# Patient Record
Sex: Male | Born: 1955
Health system: Southern US, Community
[De-identification: ages and names within clinical notes are randomized; demographics above are authoritative.]

## PROBLEM LIST (undated history)

## (undated) DIAGNOSIS — I10 Essential (primary) hypertension: Secondary | ICD-10-CM

## (undated) DIAGNOSIS — E7800 Pure hypercholesterolemia, unspecified: Secondary | ICD-10-CM

## (undated) DIAGNOSIS — M1990 Unspecified osteoarthritis, unspecified site: Secondary | ICD-10-CM

---

## 2006-07-30 ENCOUNTER — Inpatient Hospital Stay (HOSPITAL_COMMUNITY): Admission: EM | Admit: 2006-07-30 | Discharge: 2006-07-31 | Payer: Self-pay | Admitting: Emergency Medicine

## 2017-09-24 ENCOUNTER — Encounter (HOSPITAL_BASED_OUTPATIENT_CLINIC_OR_DEPARTMENT_OTHER): Payer: Self-pay | Admitting: Emergency Medicine

## 2017-09-24 ENCOUNTER — Emergency Department (HOSPITAL_BASED_OUTPATIENT_CLINIC_OR_DEPARTMENT_OTHER)
Admission: EM | Admit: 2017-09-24 | Discharge: 2017-09-24 | Disposition: A | Discharge status: Home / Self Care | Payer: Self-pay | Attending: Emergency Medicine | Admitting: Emergency Medicine

## 2017-09-24 ENCOUNTER — Emergency Department (HOSPITAL_BASED_OUTPATIENT_CLINIC_OR_DEPARTMENT_OTHER): Payer: Self-pay

## 2017-09-24 ENCOUNTER — Other Ambulatory Visit: Payer: Self-pay

## 2017-09-24 DIAGNOSIS — Z87891 Personal history of nicotine dependence: Secondary | ICD-10-CM | POA: Insufficient documentation

## 2017-09-24 DIAGNOSIS — Z8673 Personal history of transient ischemic attack (TIA), and cerebral infarction without residual deficits: Secondary | ICD-10-CM | POA: Insufficient documentation

## 2017-09-24 DIAGNOSIS — I10 Essential (primary) hypertension: Secondary | ICD-10-CM | POA: Insufficient documentation

## 2017-09-24 DIAGNOSIS — M159 Polyosteoarthritis, unspecified: Secondary | ICD-10-CM | POA: Insufficient documentation

## 2017-09-24 LAB — CBC WITH DIFFERENTIAL/PLATELET
BASOS ABS: 0.1 10*3/uL (ref 0.0–0.1)
Basophils Relative: 0 %
Eosinophils Absolute: 0.2 10*3/uL (ref 0.0–0.7)
Eosinophils Relative: 2 %
HEMATOCRIT: 45.4 % (ref 39.0–52.0)
Hemoglobin: 15.7 g/dL (ref 13.0–17.0)
LYMPHS PCT: 23 %
Lymphs Abs: 2.8 10*3/uL (ref 0.7–4.0)
MCH: 29.2 pg (ref 26.0–34.0)
MCHC: 34.6 g/dL (ref 30.0–36.0)
MCV: 84.4 fL (ref 78.0–100.0)
Monocytes Absolute: 0.7 10*3/uL (ref 0.1–1.0)
Monocytes Relative: 6 %
NEUTROS ABS: 8.2 10*3/uL — AB (ref 1.7–7.7)
NEUTROS PCT: 69 %
Platelets: 218 10*3/uL (ref 150–400)
RBC: 5.38 MIL/uL (ref 4.22–5.81)
RDW: 14.7 % (ref 11.5–15.5)
WBC: 11.9 10*3/uL — ABNORMAL HIGH (ref 4.0–10.5)

## 2017-09-24 LAB — COMPREHENSIVE METABOLIC PANEL
ALBUMIN: 3.8 g/dL (ref 3.5–5.0)
ALT: 12 U/L — ABNORMAL LOW (ref 17–63)
AST: 18 U/L (ref 15–41)
Alkaline Phosphatase: 86 U/L (ref 38–126)
Anion gap: 9 (ref 5–15)
BUN: 13 mg/dL (ref 6–20)
CHLORIDE: 107 mmol/L (ref 101–111)
CO2: 23 mmol/L (ref 22–32)
CREATININE: 1.27 mg/dL — AB (ref 0.61–1.24)
Calcium: 8.7 mg/dL — ABNORMAL LOW (ref 8.9–10.3)
GFR calc Af Amer: >60 mL/min (ref >60)
GLUCOSE: 94 mg/dL (ref 65–99)
Potassium: 3.6 mmol/L (ref 3.5–5.1)
Sodium: 139 mmol/L (ref 135–145)
Total Bilirubin: 0.8 mg/dL (ref 0.3–1.2)
Total Protein: 7.3 g/dL (ref 6.5–8.1)

## 2017-09-24 LAB — URINALYSIS, ROUTINE W REFLEX MICROSCOPIC
Bilirubin Urine: NEGATIVE
GLUCOSE, UA: NEGATIVE mg/dL
Hgb urine dipstick: NEGATIVE
Ketones, ur: NEGATIVE mg/dL
LEUKOCYTES UA: NEGATIVE
Nitrite: NEGATIVE
PROTEIN: NEGATIVE mg/dL
Specific Gravity, Urine: >1.03 — ABNORMAL HIGH (ref 1.005–1.030)
pH: 5.5 (ref 5.0–8.0)

## 2017-09-24 MED ORDER — TRAMADOL HCL 50 MG PO TABS: 50.0000 mg | ORAL_TABLET | Freq: Four times a day (QID) | ORAL | 0 refills | Status: DC | PRN

## 2017-09-24 MED ORDER — LISINOPRIL 10 MG PO TABS: 10.0000 mg | ORAL_TABLET | Freq: Every day | ORAL | 2 refills | Status: DC

## 2017-09-24 MED FILL — LISINOPRIL 10 MG TABS: 10 | 30 days supply | Qty: 30 | Fill #0

## 2017-09-24 MED FILL — traMADol HCL 50 MG TABS: 50 | 3 days supply | Qty: 15 | Fill #0

## 2017-09-24 NOTE — Discharge Instructions (Addendum)
Take the tramadol for the joint pain and low back pain.  CT of head shows evidence of multiple small Strokes in the past very important that you follow-up with neurology for further work-up.  Start taking a baby aspirin a day. Blood pressure has been significantly elevated here probably have undiagnosed hypertension.  Start the lisinopril as directed and you will need to get an appointment with a primary care doctor to make sure the blood pressure gets under control.  Return for any new or worse symptoms.

## 2017-09-24 NOTE — ED Notes (Signed)
NAD at this time. Pt is stable and going home.  

## 2017-09-24 NOTE — ED Triage Notes (Signed)
Pt family reports slurred speech for about 3 wks; pt c/o lower back and RT thigh pain; reports fall about 3 wks ago and had trouble getting up; sts he laid in the floor for about 3 hours

## 2017-09-25 NOTE — ED Provider Notes (Signed)
[facility] EMERGENCY DEPARTMENT Provider Note   CSN: [id number] Arrival date & time: 09/24/17  1056     History   Chief Complaint Chief Complaint  Patient presents with  . Slurred Speech  . Leg Pain    HPI Robert Donovan is a 62 y.o. male.  Patient with one set of concerns and family members with another set of concerns.  Patient brought in by family members.  Family was concerned about intermittent slurred speech for about 3 weeks.  Patient with complaint of low back pain and right thigh pain.  Apparently had a fall about 3 weeks ago and had trouble getting up.  Patient used to work as a Scientist, water quality.  He is to work in the [employer] as well.  Apparently about 3 weeks ago patient was on the floor for about 3 hours because he could not get up.  Family though has noted this persistent intermittent slurred speech and the very concerned about that.  Patient does not have a doctor has not had any medical care for a long period of time.  No known significant medical history.    Patient states that the most significant pain is lateral aspect of the right thigh.     History reviewed. No pertinent past medical history.  There are no active problems to display for this patient.   History reviewed. No pertinent surgical history.      Home Medications    Prior to Admission medications   Medication Sig Start Date End Date Taking? Authorizing Provider  lisinopril (PRINIVIL,ZESTRIL) 10 MG tablet Take 1 tablet (10 mg total) by mouth daily. 09/24/17   Vanetta Mulders, MD  traMADol (ULTRAM) 50 MG tablet Take 1 tablet (50 mg total) by mouth every 6 (six) hours as needed. 09/24/17   Vanetta Mulders, MD    Family History History reviewed. No pertinent family history.  Social History Social History   Tobacco Use  . Smoking status: Former Games developer  . Smokeless tobacco: Never Used  Substance Use Topics  . Alcohol use: Not Currently  . Drug use: Not Currently    Comment: hx  marijuana use     Allergies   Patient has no known allergies.   Review of Systems Review of Systems  Constitutional: Negative for fever.  HENT: Negative for congestion.   Eyes: Negative for redness and visual disturbance.  Respiratory: Negative for shortness of breath.   Cardiovascular: Negative for chest pain.  Gastrointestinal: Negative for abdominal pain.  Genitourinary: Negative for dysuria.  Musculoskeletal: Positive for back pain. Negative for joint swelling.  Skin: Negative for wound.  Neurological: Positive for speech difficulty. Negative for syncope, weakness, numbness and headaches.  Hematological: Does not bruise/bleed easily.  Psychiatric/Behavioral: Negative for confusion.     Physical Exam Updated Vital Signs BP (!) 235/106 (BP Location: Left Arm)   Pulse 62   Temp 98.4 F (36.9 C) (Oral)   Resp (!) 26   Ht 1.753 m (5\' 9" )   Wt (!) 145 kg (319 lb 10.7 oz)   SpO2 100%   BMI 47.21 kg/m   Physical Exam  Constitutional: He is oriented to person, place, and time. He appears well-developed and well-nourished. No distress.  HENT:  Head: Normocephalic and atraumatic.  Mouth/Throat: Oropharynx is clear and moist.  Eyes: Pupils are equal, round, and reactive to light. Conjunctivae and EOM are normal.  Neck: Neck supple.  No carotid bruits.  Cardiovascular: Normal rate and regular rhythm.  Pulmonary/Chest: Effort normal  and breath sounds normal.  Abdominal: Soft. Bowel sounds are normal. There is no tenderness.  Musculoskeletal: Normal range of motion.  No tenderness to palpation of low back.  No significant knee swelling.  There is pain with range of motion of the right knee right hip.  Neurological: He is alert and oriented to person, place, and time. No cranial nerve deficit or sensory deficit. He exhibits normal muscle tone. Coordination normal.  No evidence of any slurred speech today.  No significant focal neuro deficit.  Skin: Skin is warm.  Nursing  note and vitals reviewed.    ED Treatments / Results  Labs (all labs ordered are listed, but only abnormal results are displayed) Labs Reviewed  CBC WITH DIFFERENTIAL/PLATELET - Abnormal; Notable for the following components:      Result Value   WBC 11.9 (*)    Neutro Abs 8.2 (*)    All other components within normal limits  URINALYSIS, ROUTINE W REFLEX MICROSCOPIC - Abnormal; Notable for the following components:   Specific Gravity, Urine >1.030 (*)    All other components within normal limits  COMPREHENSIVE METABOLIC PANEL - Abnormal; Notable for the following components:   Creatinine, Ser 1.27 (*)    Calcium 8.7 (*)    ALT 12 (*)    All other components within normal limits    EKG EKG Interpretation  Date/Time:  Thursday September 24 2017 11:14:18 EDT Ventricular Rate:  77 PR Interval:    QRS Duration: 80 QT Interval:  370 QTC Calculation: 419 R Axis:   57 Text Interpretation:  Sinus rhythm Borderline T wave abnormalities No significant change since last tracing Confirmed by Vanetta Mulders [phone]) on 09/24/2017 12:12:47 PM   Radiology Dg Chest 2 View  Result Date: 09/24/2017 CLINICAL DATA:  Right femur and hip pain.  No injury. EXAM: CHEST - 2 VIEW COMPARISON:  CT 07/29/2006.  Chest x-ray 07/29/2006. FINDINGS: Mediastinum and hilar structures normal. Heart size normal. Low lung volumes with mild bibasilar atelectasis. Stable elevation left hemidiaphragm. No pleural effusion or pneumothorax. IMPRESSION: 1.  Low lung volumes with mild bibasilar atelectasis. 2.  Stable elevation left hemidiaphragm. Electronically Signed   By: Maisie Fus  Register   On: 09/24/2017 12:50   Ct Head Wo Contrast  Result Date: 09/24/2017 CLINICAL DATA:  Slurred speech for the past 3 weeks with recent falls. EXAM: CT HEAD WITHOUT CONTRAST TECHNIQUE: Contiguous axial images were obtained from the base of the skull through the vertex without intravenous contrast. COMPARISON:  None. FINDINGS: Brain:  Age-indeterminate lacunar infarcts in the left pons, thalamus, and basal ganglia. No evidence of acute hemorrhage, hydrocephalus, extra-axial collection or mass lesion/mass effect. Moderate periventricular and subcortical white matter hypodensities are nonspecific but favored to reflect chronic microvascular ischemic changes. Vascular: Atherosclerotic vascular calcification of the carotid siphons. No hyperdense vessel. Skull: Normal. Negative for fracture or focal lesion. Sinuses/Orbits: No acute finding. Other: None. IMPRESSION: 1. Age-indeterminate lacunar infarcts in the left pons, thalamus, and basal ganglia. Electronically Signed   By: Obie Dredge M.D.   On: 09/24/2017 12:56   Ct Lumbar Spine Wo Contrast  Result Date: 09/24/2017 CLINICAL DATA:  62 y/o M; fall 3 weeks ago with lower back pain and right hip pain. EXAM: CT LUMBAR SPINE WITHOUT CONTRAST TECHNIQUE: Multidetector CT imaging of the lumbar spine was performed without intravenous contrast administration. Multiplanar CT image reconstructions were also generated. COMPARISON:  None. FINDINGS: Segmentation: 5 lumbar type vertebrae. Alignment: Normal. Vertebrae: No acute fracture or focal pathologic process. Paraspinal  and other soft tissues: Calcific atherosclerosis of the iliofemoral arteries. Disc levels: Mild loss of the L4-5 and L5-S1 intervertebral disc space heights. Prominent multilevel lumbar facet arthrosis disc bulges and facet hypertrophy result in mild foraminal stenosis at the L3-4 and L4-5 levels multifactorial mild-to-moderate L3-4 and L4-5 canal stenosis. IMPRESSION: 1.  No acute fracture or malalignment. 2. Mild lumbar spondylosis with mild discogenic degenerative changes at the L4-5 and L5-S1 level and prominent facet arthropathy. Electronically Signed   By: Mitzi HansenLance  Furusawa-Stratton M.D.   On: 09/24/2017 14:31   Dg Hip Unilat W Or Wo Pelvis 2-3 Views Right  Result Date: 09/24/2017 CLINICAL DATA:  Right femur and hip pain.  No  injury. EXAM: DG HIP (WITH OR WITHOUT PELVIS) 2-3V RIGHT COMPARISON:  No prior. FINDINGS: Degenerative changes lumbar spine and both hips. No acute bony or joint abnormality. No evidence of fracture dislocation. IMPRESSION: Degenerative changes lumbar spine and both hips. No acute abnormality. Electronically Signed   By: Maisie Fushomas  Register   On: 09/24/2017 12:48   Dg Femur Min 2 Views Right  Result Date: 09/24/2017 CLINICAL DATA:  Right femur and hip pain.  No injury. EXAM: RIGHT FEMUR 2 VIEWS COMPARISON:  No recent. FINDINGS: Degenerative changes right hip and knee. No acute bony or joint abnormality identified. IMPRESSION: Degenerative changes right hip and knee. No acute abnormality identified. Electronically Signed   By: Maisie Fushomas  Register   On: 09/24/2017 12:47    Procedures Procedures (including critical care time)  Medications Ordered in ED Medications - No data to display   Initial Impression / Assessment and Plan / ED Course  I have reviewed the triage vital signs and the nursing notes.  Pertinent labs & imaging results that were available during my care of the patient were reviewed by me and considered in my medical decision making (see chart for details).    Patient with extensive work-up.  CT head MRI not available but CT head showed multiple small infarcts in the basal ganglia area.  All age indeterminate.  Patient significantly hypertensive here.  No carotid bruits.  None of these appear to be acute however follow-up and hypertensive control and follow-up for that will be very significant.  Work-up for all the joint pain seems to be degenerative in nature patient will be treated for tramadol and that started on lisinopril.  Did not see specific indication for admission.  Sent patient to neurology for follow-up asked for social worker to help him find a primary care doctor with various resources since he is currently unemployed.  In addition the patient's EKG had no atrial  fibrillation.  And chest x-ray without any significant findings.  Patient was given a fairly extensive work-up because he had not seen a primary care doctor in many many years.  The main findings were significant degenerative joint disease and arthritis.  Undiagnosed hypertension.  History of several small CVAs probably secondary to the hypertension.     Final Clinical Impressions(s) / ED Diagnoses   Final diagnoses:  Osteoarthritis of multiple joints, unspecified osteoarthritis type  History of multiple strokes  Essential hypertension    ED Discharge Orders        Ordered    traMADol (ULTRAM) 50 MG tablet  Every 6 hours PRN     09/24/17 1512    lisinopril (PRINIVIL,ZESTRIL) 10 MG tablet  Daily     09/24/17 1512       Vanetta MuldersZackowski, [place] Searles, MD 09/25/17 [phone]

## 2017-11-05 ENCOUNTER — Ambulatory Visit (INDEPENDENT_AMBULATORY_CARE_PROVIDER_SITE_OTHER): Payer: Self-pay | Admitting: Physician Assistant

## 2017-12-07 ENCOUNTER — Encounter (INDEPENDENT_AMBULATORY_CARE_PROVIDER_SITE_OTHER): Payer: Self-pay | Admitting: Physician Assistant

## 2017-12-07 ENCOUNTER — Emergency Department (HOSPITAL_COMMUNITY): Payer: Medicaid Other

## 2017-12-07 ENCOUNTER — Other Ambulatory Visit: Payer: Self-pay

## 2017-12-07 ENCOUNTER — Encounter (HOSPITAL_COMMUNITY): Payer: Self-pay

## 2017-12-07 ENCOUNTER — Ambulatory Visit (INDEPENDENT_AMBULATORY_CARE_PROVIDER_SITE_OTHER): Payer: Self-pay | Admitting: Physician Assistant

## 2017-12-07 ENCOUNTER — Emergency Department (HOSPITAL_COMMUNITY)
Admission: EM | Admit: 2017-12-07 | Discharge: 2017-12-07 | Disposition: A | Discharge status: Home / Self Care | Payer: Medicaid Other | Attending: Emergency Medicine | Admitting: Emergency Medicine

## 2017-12-07 VITALS — BP 212/110 | HR 57 | Temp 97.9°F | Ht 69.0 in | Wt 301.0 lb

## 2017-12-07 DIAGNOSIS — Z131 Encounter for screening for diabetes mellitus: Secondary | ICD-10-CM

## 2017-12-07 DIAGNOSIS — Z1211 Encounter for screening for malignant neoplasm of colon: Secondary | ICD-10-CM

## 2017-12-07 DIAGNOSIS — I10 Essential (primary) hypertension: Secondary | ICD-10-CM | POA: Insufficient documentation

## 2017-12-07 DIAGNOSIS — Z1159 Encounter for screening for other viral diseases: Secondary | ICD-10-CM

## 2017-12-07 DIAGNOSIS — Z7982 Long term (current) use of aspirin: Secondary | ICD-10-CM | POA: Insufficient documentation

## 2017-12-07 DIAGNOSIS — Z7722 Contact with and (suspected) exposure to environmental tobacco smoke (acute) (chronic): Secondary | ICD-10-CM | POA: Insufficient documentation

## 2017-12-07 DIAGNOSIS — Z6841 Body Mass Index (BMI) 40.0 and over, adult: Secondary | ICD-10-CM

## 2017-12-07 DIAGNOSIS — Z79899 Other long term (current) drug therapy: Secondary | ICD-10-CM | POA: Diagnosis not present

## 2017-12-07 DIAGNOSIS — Z114 Encounter for screening for human immunodeficiency virus [HIV]: Secondary | ICD-10-CM

## 2017-12-07 LAB — CBC
HCT: 46.1 % (ref 39.0–52.0)
HEMOGLOBIN: 14.6 g/dL (ref 13.0–17.0)
MCH: 28.3 pg (ref 26.0–34.0)
MCHC: 31.7 g/dL (ref 30.0–36.0)
MCV: 89.3 fL (ref 78.0–100.0)
Platelets: 224 10*3/uL (ref 150–400)
RBC: 5.16 MIL/uL (ref 4.22–5.81)
RDW: 13.9 % (ref 11.5–15.5)
WBC: 11.1 10*3/uL — ABNORMAL HIGH (ref 4.0–10.5)

## 2017-12-07 LAB — URINALYSIS, ROUTINE W REFLEX MICROSCOPIC
Bilirubin Urine: NEGATIVE
Glucose, UA: NEGATIVE mg/dL
Hgb urine dipstick: NEGATIVE
Ketones, ur: NEGATIVE mg/dL
Leukocytes, UA: NEGATIVE
Nitrite: NEGATIVE
Protein, ur: NEGATIVE mg/dL
Specific Gravity, Urine: 1.024 (ref 1.005–1.030)
pH: 5 (ref 5.0–8.0)

## 2017-12-07 LAB — I-STAT TROPONIN, ED: TROPONIN I, POC: 0.01 ng/mL (ref 0.00–0.08)

## 2017-12-07 LAB — BASIC METABOLIC PANEL
ANION GAP: 4 — AB (ref 5–15)
BUN: 13 mg/dL (ref 6–20)
CALCIUM: 9.6 mg/dL (ref 8.9–10.3)
CO2: 32 mmol/L (ref 22–32)
CREATININE: 1.33 mg/dL — AB (ref 0.61–1.24)
Chloride: 108 mmol/L (ref 98–111)
GFR calc Af Amer: >60 mL/min (ref >60)
GFR, EST NON AFRICAN AMERICAN: 57 mL/min — AB (ref >60)
GLUCOSE: 103 mg/dL — AB (ref 70–99)
Potassium: 4.2 mmol/L (ref 3.5–5.1)
Sodium: 144 mmol/L (ref 135–145)

## 2017-12-07 LAB — GLUCOSE, POCT (MANUAL RESULT ENTRY): POC GLUCOSE: 102 mg/dL — AB (ref 70–99)

## 2017-12-07 MED ORDER — LISINOPRIL 40 MG PO TABS: 40.0000 mg | ORAL_TABLET | Freq: Every day | ORAL | 11 refills | Status: DC

## 2017-12-07 MED ORDER — ATORVASTATIN CALCIUM 40 MG PO TABS: 40.0000 mg | ORAL_TABLET | Freq: Every day | ORAL | 11 refills | Status: DC

## 2017-12-07 MED ORDER — LISINOPRIL-HYDROCHLOROTHIAZIDE 20-25 MG PO TABS: 1.0000 | ORAL_TABLET | Freq: Every day | ORAL | 0 refills | Status: DC

## 2017-12-07 MED ORDER — CLONIDINE HCL 0.1 MG PO TABS
0.2000 mg | ORAL_TABLET | Freq: Once | ORAL | Status: AC
  Administered 2017-12-07: 0.2 mg via ORAL

## 2017-12-07 MED ORDER — HYDROCHLOROTHIAZIDE 25 MG PO TABS: 25.0000 mg | ORAL_TABLET | Freq: Every day | ORAL | 11 refills | Status: DC

## 2017-12-07 MED ORDER — ASPIRIN EC 81 MG PO TBEC: 81.0000 mg | DELAYED_RELEASE_TABLET | Freq: Every day | ORAL | 3 refills | Status: DC

## 2017-12-07 MED ORDER — HYDRALAZINE HCL 20 MG/ML IJ SOLN
10.0000 mg | Freq: Once | INTRAMUSCULAR | Status: AC
  Administered 2017-12-07: 10 mg via INTRAVENOUS
  Filled 2017-12-07: qty 1

## 2017-12-07 MED FILL — HYDROCHLOROTHIAZIDE 25 MG T: 25 | 30 days supply | Qty: 30 | Fill #0

## 2017-12-07 MED FILL — ATORVASTATIN CALCIUM 40 MG: 40 | 30 days supply | Qty: 30 | Fill #0

## 2017-12-07 MED FILL — LISINOPRIL 40 MG TABLET: 40 | 30 days supply | Qty: 30 | Fill #0

## 2017-12-07 NOTE — ED Provider Notes (Signed)
[facility] Provider Note   CSN: [id number] Arrival date & time: 12/07/17  1632     History   Chief Complaint Chief Complaint  Patient presents with  . Hypertension    HPI Rachael FeeDarryl Robert Donovan is a 62 y.o. male.  HPI   Robert Donovan is a 62yo male with a history of HTN and arthritis who presents to the Emergency Department from his PCP office for evaluation of hypertension.  Patient reports that he was establishing care with PCP today and they noted his blood pressure to be 215/105 in the office.  They gave him 0.2 mg clonidine and this did not improve his blood pressure so they sent him here for further evaluation and work-up.  Patient denies any symptoms.  He denies chest pain, shortness of breath, abdominal pain, nausea/vomiting, reduction in urine, numbness, weakness, headache, visual disturbance, leg swelling.  States that he was seen in the ER 08/2017 where he was started on lisinopril.  Reports taking that for 2 weeks, but then stopped because he kept forgetting to take it.  He does state that he has had shortness of breath upon exertion for many years now, unchanged from baseline.    History reviewed. No pertinent past medical history.  There are no active problems to display for this patient.   History reviewed. No pertinent surgical history.      Home Medications    Prior to Admission medications   Medication Sig Start Date End Date Taking? Authorizing Provider  aspirin EC 81 MG tablet Take 1 tablet (81 mg total) by mouth daily. Patient not taking: Reported on 12/07/2017 12/07/17   Loletta SpecterGomez, Roger David, PA-C  atorvastatin (LIPITOR) 40 MG tablet Take 1 tablet (40 mg total) by mouth daily. Patient not taking: Reported on 12/07/2017 12/07/17   Loletta SpecterGomez, Roger David, PA-C  hydrochlorothiazide (HYDRODIURIL) 25 MG tablet Take 1 tablet (25 mg total) by mouth daily. Take on tablet in the morning. Patient not taking: Reported on 12/07/2017 12/07/17   Loletta SpecterGomez,  Roger David, PA-C  lisinopril (PRINIVIL,ZESTRIL) 40 MG tablet Take 1 tablet (40 mg total) by mouth daily. Patient not taking: Reported on 12/07/2017 12/07/17   Loletta SpecterGomez, Roger David, PA-C    Family History No family history on file.  Social History Social History   Tobacco Use  . Smoking status: Former Games developermoker  . Smokeless tobacco: Never Used  Substance Use Topics  . Alcohol use: Not Currently  . Drug use: Not Currently    Comment: hx marijuana use     Allergies   Patient has no known allergies.   Review of Systems Review of Systems  Constitutional: Negative for chills and fever.  HENT: Negative for congestion and trouble swallowing.   Eyes: Negative for visual disturbance.  Respiratory: Negative for cough, shortness of breath and wheezing.   Cardiovascular: Negative for chest pain and leg swelling.  Gastrointestinal: Negative for abdominal pain, nausea and vomiting.  Genitourinary: Negative for difficulty urinating and frequency.  Musculoskeletal: Negative for back pain and gait problem.  Skin: Negative for rash.  Neurological: Negative for weakness, light-headedness and headaches.     Physical Exam Updated Vital Signs BP (!) 145/78   Pulse 61   Temp (!) 97.3 F (36.3 C) (Oral)   Resp (!) 23   Ht 5\' 9"  (1.753 m)   Wt (!) 136.5 kg (301 lb)   SpO2 96%   BMI 44.45 kg/m   Physical Exam  Constitutional: He is oriented to person, place,  and time. He appears well-developed and well-nourished. No distress.  Nontoxic-appearing, no acute distress.  HENT:  Head: Normocephalic and atraumatic.  Mouth/Throat: Oropharynx is clear and moist. No oropharyngeal exudate.  Eyes: Pupils are equal, round, and reactive to light. Conjunctivae are normal. Right eye exhibits no discharge. Left eye exhibits no discharge.  Neck: Normal range of motion. Neck supple. No JVD present. No tracheal deviation present.  Cardiovascular: Normal rate, regular rhythm and intact distal pulses.    Pulmonary/Chest: Effort normal and breath sounds normal. No stridor. No respiratory distress. He has no wheezes. He has no rales.  Abdominal: Soft. Bowel sounds are normal. There is no tenderness.  Musculoskeletal:  No appreciable leg swelling.  Neurological: He is alert and oriented to person, place, and time. Coordination normal.  Skin: Skin is warm and dry. He is not diaphoretic.  Psychiatric: He has a normal mood and affect. His behavior is normal.  Nursing note and vitals reviewed.    ED Treatments / Results  Labs (all labs ordered are listed, but only abnormal results are displayed) Labs Reviewed  BASIC METABOLIC PANEL - Abnormal; Notable for the following components:      Result Value   Glucose, Bld 103 (*)    Creatinine, Ser 1.33 (*)    GFR calc non Af Amer 57 (*)    Anion gap 4 (*)    All other components within normal limits  CBC - Abnormal; Notable for the following components:   WBC 11.1 (*)    All other components within normal limits  URINALYSIS, ROUTINE W REFLEX MICROSCOPIC  I-STAT TROPONIN, ED    EKG EKG Interpretation  Date/Time:  Monday December 07 2017 17:15:15 EDT Ventricular Rate:  55 PR Interval:  138 QRS Duration: 82 QT Interval:  428 QTC Calculation: 409 R Axis:   52 Text Interpretation:  Sinus bradycardia Nonspecific T wave abnormality Abnormal ECG Confirmed by Rolan Bucco [phone]) on 12/08/2017 8:09:09 AM   Radiology Dg Chest 2 View  Result Date: 12/07/2017 CLINICAL DATA:  Chest pain, recent diagnosis of hypertension, slurred speech, stutter, history of stroke and fall 1 month ago, former smoker EXAM: CHEST - 2 VIEW COMPARISON:  09/24/2017 FINDINGS: Normal heart size, mediastinal contours, and pulmonary vascularity. Bronchitic changes with persistent atelectasis at LEFT base. Remaining lungs clear. No pleural effusion or pneumothorax. Scattered endplate spur formation thoracic spine. IMPRESSION: Bronchitic changes with persistent LEFT basilar  atelectasis. Electronically Signed   By: Ulyses Southward M.D.   On: 12/07/2017 18:15    Procedures Procedures (including critical care time)  Medications Ordered in ED Medications  hydrALAZINE (APRESOLINE) injection 10 mg (10 mg Intravenous Given 12/07/17 2158)     Initial Impression / Assessment and Plan / ED Course  I have reviewed the triage vital signs and the nursing notes.  Pertinent labs & imaging results that were available during my care of the patient were reviewed by me and considered in my medical decision making (see chart for details).    Patient presents from his PCP office for evaluation of hypertension.  He has a history of this, is not taking prescribed lisinopril.  Has no physical complaints.  He denies chest pain, shortness of breath, reduction in urine, abdominal pain, nausea/vomiting, leg swelling, headache, double vision.  Initial blood pressure 218/125. On exam he is afebrile and no-toxic. Lungs CTA. Heart rate regular, no murmur. No neurological deficits. No signs of fluid overload. Doubt hypertensive emergency given patient is asymptomatic.   Patient had basic labs  and chest x-ray ordered at triage.  I-STAT troponin negative, he denies chest pain and EKG nonischemic.  No concern for ACS at this time.  BMP reveals mildly elevated creatinine which appears to be baseline (1.33 versus 1.27 baseline.)  CBC with mild leukocytosis, although this seems to be chronic as well (WBC 11.1) UA WNL.  Chest x-ray with stable bronchitic changes.  No pulmonary edema or cardiomegaly on chest x-ray and no evidence of fluid overload on exam.  Do not suspect acute CHF exacerbation.  Patient will be discharged with lisinopril/HCTZ combo given he has tolerated these medications in the past.  Have counseled him to follow-up with his PCP for close monitoring.  Have counseled him on reasons to return to the emergency department and he agrees and appears reliable.  Final Clinical Impressions(s) / ED  Diagnoses   Final diagnoses:  Hypertension, unspecified type    ED Discharge Orders        Ordered    lisinopril-hydrochlorothiazide (PRINZIDE,ZESTORETIC) 20-25 MG tablet  Daily     12/07/17 2231       Kellie Shropshire, PA-C 12/09/17 1542    Vanetta Mulders, MD 12/09/17 [phone]

## 2017-12-07 NOTE — ED Triage Notes (Signed)
Pt states that he was seen today and told his BP was high and the he should come to ED to be evaluated. Pt denies being on any BP medication.

## 2017-12-07 NOTE — Discharge Instructions (Addendum)
Your blood work, EKG and chest x-ray were reassuring.  Please have your kidney function and blood pressure rechecked by your regular doctor.  There was a small bump in your creatinine which is a measure of kidney function (1.33).  I have written you prescription for blood pressure medication that you can take daily.  Please take this as prescribed.  Return to the ER if you have any new or concerning symptoms like chest pain, trouble breathing, headache, problems with your vision.

## 2017-12-07 NOTE — ED Provider Notes (Signed)
Patient placed in Quick Look pathway, seen and evaluated   Chief Complaint: Hypertension  HPI:   Patient brought in by PCP for management of resistant hypertension.  He established care today with his PCP who noted him to be quite hypertensive.  He was seen in the ED in April and was given a prescription for lisinopril which he states he took to completion but has not had any since then.  Denies chest pain, shortness of breath, vision changes, headaches, decreased urine output, abdominal pain, numbness, or weakness.  Blood pressure in the office today was 205/112.  Patient was given 0.2 mg of clonidine in the office without improvement in his blood pressure.  Sent to the ED for further evaluation and management.  ROS: Negative for chest pain, shortness of breath, abdominal pain, nausea, vomiting, decreased urine output, vision changes, headaches  Physical Exam:   Gen: No distress, morbidly obese, resting comfortable in chair  Neuro: Awake and Alert  Skin: Warm    Focused Exam: No dysarthric speech, no facial droop.  Cranial nerves appear grossly intact as do EOMs.  Heart rate and rhythm regular, no murmurs rubs or gallops noted.  Equal rise and fall of chest, no increased work of breathing.  2+ radial and DP/PT pulses bilaterally.   Initiation of care has begun. The patient has been counseled on the process, plan, and necessity for staying for the completion/evaluation, and the remainder of the medical screening examination    Jeanie SewerFawze, Saafir Abdullah A, PA-C 12/07/17 1723    Maia PlanLong, Joshua G, MD 12/08/17 1102

## 2017-12-07 NOTE — ED Notes (Signed)
ED Provider at bedside. 

## 2017-12-07 NOTE — Progress Notes (Signed)
Subjective:  Patient ID: Robert Donovan, male    DOB: 02/15/1956  Age: 62 y.o. MRN: [id number]  CC: establish care   HPI Robert Donovan is a 62 y.o. male with a medical history of HTN, multiple strokes, obesity, and OA of multiple joints presents as a new patient. He is not sure why he came in today but was told to establish a PCP. Went to ED on 09/24/17 for family member's concern for slurred speech and diagnosed with HTN. CT head revealed age-indeterminate lacunar infarcts in the left pons, thalamus, and basal ganglia. Pt currently taking lisinopril 10 mg. Admits to eating salty foods. Does not exercise regularly. BMI 44.45 kg/m2 today in clinic. Does not endorse CP, palpitations, SOB, HA, tingling, numbness, slurred speech, weakness, paralysis, abdominal pain, f/c/n/v, rash, or GI/GU sxs.         Outpatient Medications Prior to Visit  Medication Sig Dispense Refill  . lisinopril (PRINIVIL,ZESTRIL) 10 MG tablet Take 1 tablet (10 mg total) by mouth daily. (Patient not taking: Reported on 12/07/2017) 30 tablet 2  . traMADol (ULTRAM) 50 MG tablet Take 1 tablet (50 mg total) by mouth every 6 (six) hours as needed. 15 tablet 0   No facility-administered medications prior to visit.      ROS Review of Systems  Constitutional: Negative for chills, fever and malaise/fatigue.  Eyes: Negative for blurred vision.  Respiratory: Negative for shortness of breath.   Cardiovascular: Negative for chest pain and palpitations.  Gastrointestinal: Negative for abdominal pain and nausea.  Genitourinary: Negative for dysuria and hematuria.  Musculoskeletal: Negative for joint pain and myalgias.  Skin: Negative for rash.  Neurological: Negative for tingling and headaches.  Psychiatric/Behavioral: Negative for depression. The patient is not nervous/anxious.     Objective:  BP (!) 205/112 (BP Location: Right Arm, Patient Position: Sitting, Cuff Size: Large)   Pulse 73   Temp 97.9 F (36.6 C) (Oral)   Ht  5\' 9"  (1.753 m)   Wt (!) 301 lb (136.5 kg)   SpO2 92%   BMI 44.45 kg/m   BP/Weight 12/07/2017 09/24/2017  Systolic BP 205 235  Diastolic BP 112 106  Wt. (Lbs) 301 319.67  BMI 44.45 47.21      Physical Exam  Constitutional: He is oriented to person, place, and time.  Well developed, well nourished, NAD, polite  HENT:  Head: Normocephalic and atraumatic.  Eyes: No scleral icterus.  Neck: Normal range of motion. Neck supple. No thyromegaly present.  Cardiovascular: Normal rate, regular rhythm and normal heart sounds.  Pulmonary/Chest: Effort normal and breath sounds normal.  Abdominal: Soft. Bowel sounds are normal. There is no tenderness.  Musculoskeletal: He exhibits no edema.  Neurological: He is alert and oriented to person, place, and time.  Skin: Skin is warm and dry. No rash noted. No erythema. No pallor.  Psychiatric: He has a normal mood and affect. His behavior is normal. Thought content normal.  Vitals reviewed.    Assessment & Plan:   1. Hypertension, unspecified type - BP resistant to clonidine administered in clinic. Several readings were elevated over a one and a half hour period. Pt advised to have BP lowered at emergency department. - administered cloNIDine (CATAPRES) tablet 0.2 mg - TSH - Basic Metabolic Panel - Lipid panel; Future - hydrochlorothiazide (HYDRODIURIL) 25 MG tablet; Take 1 tablet (25 mg total) by mouth daily. Take on tablet in the morning.  Dispense: 30 tablet; Refill: 11 - lisinopril (PRINIVIL,ZESTRIL) 40 MG tablet; Take 1 tablet (40  mg total) by mouth daily.  Dispense: 30 tablet; Refill: 11 - aspirin EC 81 MG tablet; Take 1 tablet (81 mg total) by mouth daily.  Dispense: 90 tablet; Refill: 3 - atorvastatin (LIPITOR) 40 MG tablet; Take 1 tablet (40 mg total) by mouth daily.  Dispense: 30 tablet; Refill: 11  2. Screening for HIV (human immunodeficiency virus) - HIV antibody  3. Need for hepatitis C screening test - Hepatitis c antibody  (reflex)  4. Special screening for malignant neoplasms, colon - Fecal occult blood, imunochemical  5. Class 3 severe obesity with serious comorbidity and body mass index of 40.0 to 44.9 in adult, unspecified obesity type - Pt advised to complete CAFA process so he may be sent to a nutritionist. Pt given CAFA application in clinic today.     Meds ordered this encounter  Medications  . cloNIDine (CATAPRES) tablet 0.2 mg  . hydrochlorothiazide (HYDRODIURIL) 25 MG tablet    Sig: Take 1 tablet (25 mg total) by mouth daily. Take on tablet in the morning.    Dispense:  30 tablet    Refill:  11    Order Specific Question:   Supervising Provider    Answer:   Hoy Register [[id number]]  . lisinopril (PRINIVIL,ZESTRIL) 40 MG tablet    Sig: Take 1 tablet (40 mg total) by mouth daily.    Dispense:  30 tablet    Refill:  11    Order Specific Question:   Supervising Provider    Answer:   Hoy Register [[id number]]  . aspirin EC 81 MG tablet    Sig: Take 1 tablet (81 mg total) by mouth daily.    Dispense:  90 tablet    Refill:  3    Order Specific Question:   Supervising Provider    Answer:   Hoy Register [[id number]]  . atorvastatin (LIPITOR) 40 MG tablet    Sig: Take 1 tablet (40 mg total) by mouth daily.    Dispense:  30 tablet    Refill:  11    Order Specific Question:   Supervising Provider    Answer:   Hoy Register [[id number]]    Follow-up: Return in about 1 month (around 01/04/2018) for HTN.   Loletta Specter PA

## 2017-12-07 NOTE — Patient Instructions (Signed)
DASH Eating Plan DASH stands for "Dietary Approaches to Stop Hypertension." The DASH eating plan is a healthy eating plan that has been shown to reduce high blood pressure (hypertension). It may also reduce your risk for type 2 diabetes, heart disease, and stroke. The DASH eating plan may also help with weight loss. What are tips for following this plan? General guidelines  Avoid eating more than 2,300 mg (milligrams) of salt (sodium) a day. If you have hypertension, you may need to reduce your sodium intake to 1,500 mg a day.  Limit alcohol intake to no more than 1 drink a day for nonpregnant women and 2 drinks a day for men. One drink equals 12 oz of beer, 5 oz of wine, or 1 oz of hard liquor.  Work with your health care provider to maintain a healthy body weight or to lose weight. Ask what an ideal weight is for you.  Get at least 30 minutes of exercise that causes your heart to beat faster (aerobic exercise) most days of the week. Activities may include walking, swimming, or biking.  Work with your health care provider or diet and nutrition specialist (dietitian) to adjust your eating plan to your individual calorie needs. Reading food labels  Check food labels for the amount of sodium per serving. Choose foods with less than 5 percent of the Daily Value of sodium. Generally, foods with less than 300 mg of sodium per serving fit into this eating plan.  To find whole grains, look for the word "whole" as the first word in the ingredient list. Shopping  Buy products labeled as "low-sodium" or "no salt added."  Buy fresh foods. Avoid canned foods and premade or frozen meals. Cooking  Avoid adding salt when cooking. Use salt-free seasonings or herbs instead of table salt or sea salt. Check with your health care provider or pharmacist before using salt substitutes.  Do not fry foods. Cook foods using healthy methods such as baking, boiling, grilling, and broiling instead.  Cook with  heart-healthy oils, such as olive, canola, soybean, or sunflower oil. Meal planning   Eat a balanced diet that includes: ? 5 or more servings of fruits and vegetables each day. At each meal, try to fill half of your plate with fruits and vegetables. ? Up to 6-8 servings of whole grains each day. ? Less than 6 oz of lean meat, poultry, or fish each day. A 3-oz serving of meat is about the same size as a deck of cards. One egg equals 1 oz. ? 2 servings of low-fat dairy each day. ? A serving of nuts, seeds, or beans 5 times each week. ? Heart-healthy fats. Healthy fats called Omega-3 fatty acids are found in foods such as flaxseeds and coldwater fish, like sardines, salmon, and mackerel.  Limit how much you eat of the following: ? Canned or prepackaged foods. ? Food that is high in trans fat, such as fried foods. ? Food that is high in saturated fat, such as fatty meat. ? Sweets, desserts, sugary drinks, and other foods with added sugar. ? Full-fat dairy products.  Do not salt foods before eating.  Try to eat at least 2 vegetarian meals each week.  Eat more home-cooked food and less restaurant, buffet, and fast food.  When eating at a restaurant, ask that your food be prepared with less salt or no salt, if possible. What foods are recommended? The items listed may not be a complete list. Talk with your dietitian about what   dietary choices are best for you. Grains Whole-grain or whole-wheat bread. Whole-grain or whole-wheat pasta. Brown rice. Oatmeal. Quinoa. Bulgur. Whole-grain and low-sodium cereals. Pita bread. Low-fat, low-sodium crackers. Whole-wheat flour tortillas. Vegetables Fresh or frozen vegetables (raw, steamed, roasted, or grilled). Low-sodium or reduced-sodium tomato and vegetable juice. Low-sodium or reduced-sodium tomato sauce and tomato paste. Low-sodium or reduced-sodium canned vegetables. Fruits All fresh, dried, or frozen fruit. Canned fruit in natural juice (without  added sugar). Meat and other protein foods Skinless chicken or turkey. Ground chicken or turkey. Pork with fat trimmed off. Fish and seafood. Egg whites. Dried beans, peas, or lentils. Unsalted nuts, nut butters, and seeds. Unsalted canned beans. Lean cuts of beef with fat trimmed off. Low-sodium, lean deli meat. Dairy Low-fat (1%) or fat-free (skim) milk. Fat-free, low-fat, or reduced-fat cheeses. Nonfat, low-sodium ricotta or cottage cheese. Low-fat or nonfat yogurt. Low-fat, low-sodium cheese. Fats and oils Soft margarine without trans fats. Vegetable oil. Low-fat, reduced-fat, or light mayonnaise and salad dressings (reduced-sodium). Canola, safflower, olive, soybean, and sunflower oils. Avocado. Seasoning and other foods Herbs. Spices. Seasoning mixes without salt. Unsalted popcorn and pretzels. Fat-free sweets. What foods are not recommended? The items listed may not be a complete list. Talk with your dietitian about what dietary choices are best for you. Grains Baked goods made with fat, such as croissants, muffins, or some breads. Dry pasta or rice meal packs. Vegetables Creamed or fried vegetables. Vegetables in a cheese sauce. Regular canned vegetables (not low-sodium or reduced-sodium). Regular canned tomato sauce and paste (not low-sodium or reduced-sodium). Regular tomato and vegetable juice (not low-sodium or reduced-sodium). Pickles. Olives. Fruits Canned fruit in a light or heavy syrup. Fried fruit. Fruit in cream or butter sauce. Meat and other protein foods Fatty cuts of meat. Ribs. Fried meat. Bacon. Sausage. Bologna and other processed lunch meats. Salami. Fatback. Hotdogs. Bratwurst. Salted nuts and seeds. Canned beans with added salt. Canned or smoked fish. Whole eggs or egg yolks. Chicken or turkey with skin. Dairy Whole or 2% milk, cream, and half-and-half. Whole or full-fat cream cheese. Whole-fat or sweetened yogurt. Full-fat cheese. Nondairy creamers. Whipped toppings.  Processed cheese and cheese spreads. Fats and oils Butter. Stick margarine. Lard. Shortening. Ghee. Bacon fat. Tropical oils, such as coconut, palm kernel, or palm oil. Seasoning and other foods Salted popcorn and pretzels. Onion salt, garlic salt, seasoned salt, table salt, and sea salt. Worcestershire sauce. Tartar sauce. Barbecue sauce. Teriyaki sauce. Soy sauce, including reduced-sodium. Steak sauce. Canned and packaged gravies. Fish sauce. Oyster sauce. Cocktail sauce. Horseradish that you find on the shelf. Ketchup. Mustard. Meat flavorings and tenderizers. Bouillon cubes. Hot sauce and Tabasco sauce. Premade or packaged marinades. Premade or packaged taco seasonings. Relishes. Regular salad dressings. Where to find more information:  National Heart, Lung, and Blood Institute: www.nhlbi.nih.gov  American Heart Association: www.heart.org Summary  The DASH eating plan is a healthy eating plan that has been shown to reduce high blood pressure (hypertension). It may also reduce your risk for type 2 diabetes, heart disease, and stroke.  With the DASH eating plan, you should limit salt (sodium) intake to 2,300 mg a day. If you have hypertension, you may need to reduce your sodium intake to 1,500 mg a day.  When on the DASH eating plan, aim to eat more fresh fruits and vegetables, whole grains, lean proteins, low-fat dairy, and heart-healthy fats.  Work with your health care provider or diet and nutrition specialist (dietitian) to adjust your eating plan to your individual   calorie needs. This information is not intended to replace advice given to you by your health care provider. Make sure you discuss any questions you have with your health care provider. Document Released: 05/08/2011 Document Revised: 05/12/2016 Document Reviewed: 05/12/2016 Elsevier Interactive Patient Education  2018 Elsevier Inc.  

## 2017-12-08 LAB — BASIC METABOLIC PANEL
BUN/Creatinine Ratio: 11 (ref 9–20)
BUN: 13 mg/dL (ref 6–24)
CO2: 22 mmol/L (ref 20–29)
Calcium: 9.4 mg/dL (ref 8.7–10.2)
Chloride: 106 mmol/L (ref 96–106)
Creatinine, Ser: 1.19 mg/dL (ref 0.76–1.27)
GFR calc Af Amer: 77 mL/min/{1.73_m2} (ref >59)
GFR, EST NON AFRICAN AMERICAN: 66 mL/min/{1.73_m2} (ref >59)
GLUCOSE: 101 mg/dL — AB (ref 65–99)
POTASSIUM: 4.5 mmol/L (ref 3.5–5.2)
Sodium: 145 mmol/L — ABNORMAL HIGH (ref 134–144)

## 2017-12-08 LAB — HEPATITIS C ANTIBODY (REFLEX): HCV Ab: <0.1 s/co ratio (ref 0.0–0.9)

## 2017-12-08 LAB — HCV COMMENT:

## 2017-12-08 LAB — HEMOGLOBIN A1C
Est. average glucose Bld gHb Est-mCnc: 97 mg/dL
Hgb A1c MFr Bld: 5 % (ref 4.8–5.6)

## 2017-12-08 LAB — HIV ANTIBODY (ROUTINE TESTING W REFLEX): HIV SCREEN 4TH GENERATION: NONREACTIVE

## 2017-12-08 LAB — TSH: TSH: 1.59 u[IU]/mL (ref 0.450–4.500)

## 2017-12-09 ENCOUNTER — Telehealth (INDEPENDENT_AMBULATORY_CARE_PROVIDER_SITE_OTHER): Payer: Self-pay

## 2017-12-09 NOTE — Telephone Encounter (Signed)
-----   Message from Loletta Specteroger David Gomez, PA-C sent at 12/08/2017  8:35 AM EDT ----- All labs normal.

## 2017-12-09 NOTE — Telephone Encounter (Signed)
Patient is aware of normal labs. Tempestt S Roberts, CMA  

## 2017-12-10 LAB — FECAL OCCULT BLOOD, IMMUNOCHEMICAL: FECAL OCCULT BLD: NEGATIVE

## 2017-12-11 ENCOUNTER — Telehealth (INDEPENDENT_AMBULATORY_CARE_PROVIDER_SITE_OTHER): Payer: Self-pay

## 2017-12-11 NOTE — Telephone Encounter (Signed)
-----   Message from Loletta Specteroger David Gomez, PA-C sent at 12/10/2017  8:36 AM EDT ----- Negative FIT.

## 2017-12-11 NOTE — Telephone Encounter (Signed)
Patient aware of negative FIT ( no blood found). Robert Donovan, CMA

## 2017-12-21 ENCOUNTER — Ambulatory Visit: Payer: Self-pay | Attending: Physician Assistant

## 2017-12-30 ENCOUNTER — Ambulatory Visit: Payer: Self-pay | Attending: Physician Assistant

## 2018-01-07 ENCOUNTER — Other Ambulatory Visit: Payer: Self-pay

## 2018-01-07 ENCOUNTER — Ambulatory Visit (INDEPENDENT_AMBULATORY_CARE_PROVIDER_SITE_OTHER): Payer: Self-pay | Admitting: Physician Assistant

## 2018-01-07 ENCOUNTER — Encounter (INDEPENDENT_AMBULATORY_CARE_PROVIDER_SITE_OTHER): Payer: Self-pay | Admitting: Physician Assistant

## 2018-01-07 VITALS — BP 149/88 | HR 76 | Temp 97.7°F | Ht 69.0 in | Wt 293.4 lb

## 2018-01-07 DIAGNOSIS — I10 Essential (primary) hypertension: Secondary | ICD-10-CM

## 2018-01-07 MED ORDER — ATORVASTATIN CALCIUM 40 MG PO TABS: 40.0000 mg | ORAL_TABLET | Freq: Every day | ORAL | 11 refills | Status: DC

## 2018-01-07 MED ORDER — LISINOPRIL-HYDROCHLOROTHIAZIDE 20-25 MG PO TABS: 1.0000 | ORAL_TABLET | Freq: Every day | ORAL | 11 refills | Status: DC

## 2018-01-07 MED ORDER — ASPIRIN EC 81 MG PO TBEC: 81.0000 mg | DELAYED_RELEASE_TABLET | Freq: Every day | ORAL | 3 refills | Status: DC

## 2018-01-07 MED FILL — ?ATORVASTATIN 40MG TABLET: 40 | 30 days supply | Qty: 30 | Fill #0

## 2018-01-07 MED FILL — ?LISINOPRIL-HCTZ 20/25 TAB: 20-25 | 30 days supply | Qty: 30 | Fill #0

## 2018-01-07 NOTE — Patient Instructions (Signed)

## 2018-01-07 NOTE — Progress Notes (Signed)
Subjective:  Patient ID: Robert Donovan, male    DOB: 10/08/1955  Age: 62 y.o. MRN: [id number]  CC: f/u HTN  HPI Robert Donovan is a 62 y.o. male with a medical history of HTN, multiple strokes, obesity, and OA of multiple joints presents on HTN f/u. Last BP 212/110 mmHg. Prescribed HCTZ, Lisinopril, and advised to go to ED that day. ED workup found no evidence for admission or observation. ED prescribed Prinzide 20 - 25 mg. Patient has been taking Prinzide daily and ran out yesterday. Says he feels well and denies any symptoms or complaints.     Outpatient Medications Prior to Visit  Medication Sig Dispense Refill  . lisinopril-hydrochlorothiazide (PRINZIDE,ZESTORETIC) 20-25 MG tablet Take 1 tablet by mouth daily. 30 tablet 0  . aspirin EC 81 MG tablet Take 1 tablet (81 mg total) by mouth daily. (Patient not taking: Reported on 12/07/2017) 90 tablet 3  . atorvastatin (LIPITOR) 40 MG tablet Take 1 tablet (40 mg total) by mouth daily. (Patient not taking: Reported on 12/07/2017) 30 tablet 11  . hydrochlorothiazide (HYDRODIURIL) 25 MG tablet Take 1 tablet (25 mg total) by mouth daily. Take on tablet in the morning. (Patient not taking: Reported on 12/07/2017) 30 tablet 11  . lisinopril (PRINIVIL,ZESTRIL) 40 MG tablet Take 1 tablet (40 mg total) by mouth daily. (Patient not taking: Reported on 12/07/2017) 30 tablet 11   No facility-administered medications prior to visit.      ROS Review of Systems  Constitutional: Negative for chills, fever and malaise/fatigue.  Eyes: Negative for blurred vision.  Respiratory: Negative for shortness of breath.   Cardiovascular: Negative for chest pain and palpitations.  Gastrointestinal: Negative for abdominal pain and nausea.  Genitourinary: Negative for dysuria and hematuria.  Musculoskeletal: Negative for joint pain and myalgias.  Skin: Negative for rash.  Neurological: Negative for tingling and headaches.  Psychiatric/Behavioral: Negative for depression.  The patient is not nervous/anxious.     Objective:  BP (!) 149/88 (BP Location: Left Arm, Patient Position: Sitting, Cuff Size: Large)   Pulse 76   Temp 97.7 F (36.5 C) (Oral)   Ht 5\' 9"  (1.753 m)   Wt 293 lb 6.4 oz (133.1 kg)   SpO2 94%   BMI 43.33 kg/m   BP/Weight 01/07/2018 12/07/2017 12/07/2017  Systolic BP 149 150 212  Diastolic BP 88 78 110  Wt. (Lbs) 293.4 301 301  BMI 43.33 44.45 44.45      Physical Exam  Constitutional: He is oriented to person, place, and time.  Well developed, obese, NAD, polite  HENT:  Head: Normocephalic and atraumatic.  Eyes: No scleral icterus.  Neck: Normal range of motion. Neck supple. No thyromegaly present.  Cardiovascular: Normal rate, regular rhythm and normal heart sounds.  Pulmonary/Chest: Effort normal and breath sounds normal.  Abdominal: Soft. Bowel sounds are normal. There is no tenderness.  Musculoskeletal: He exhibits no edema.  Neurological: He is alert and oriented to person, place, and time.  Skin: Skin is warm and dry. No rash noted. No erythema. No pallor.  Psychiatric: He has a normal mood and affect. His behavior is normal. Thought content normal.  Vitals reviewed.    Assessment & Plan:    1. Hypertension, unspecified type - Refill atorvastatin (LIPITOR) 40 MG tablet; Take 1 tablet (40 mg total) by mouth daily.  Dispense: 30 tablet; Refill: 11 - Refill aspirin EC 81 MG tablet; Take 1 tablet (81 mg total) by mouth daily.  Dispense: 90 tablet; Refill: 3 - Refill  lisinopril-hydrochlorothiazide (PRINZIDE,ZESTORETIC) 20-25 MG tablet; Take 1 tablet by mouth daily.  Dispense: 30 tablet; Refill: 11   Meds ordered this encounter  Medications  . atorvastatin (LIPITOR) 40 MG tablet    Sig: Take 1 tablet (40 mg total) by mouth daily.    Dispense:  30 tablet    Refill:  11    Order Specific Question:   Supervising Provider    Answer:   Hoy Register [[id number]]  . aspirin EC 81 MG tablet    Sig: Take 1 tablet (81 mg total) by  mouth daily.    Dispense:  90 tablet    Refill:  3    Order Specific Question:   Supervising Provider    Answer:   Hoy Register [[id number]]  . lisinopril-hydrochlorothiazide (PRINZIDE,ZESTORETIC) 20-25 MG tablet    Sig: Take 1 tablet by mouth daily.    Dispense:  30 tablet    Refill:  11    Order Specific Question:   Supervising Provider    Answer:   Hoy Register [[id number]]    Follow-up: Return in about 2 weeks (around 01/21/2018) for BP check only.   Loletta Specter PA

## 2018-01-21 ENCOUNTER — Other Ambulatory Visit: Payer: Self-pay

## 2018-01-21 ENCOUNTER — Encounter (INDEPENDENT_AMBULATORY_CARE_PROVIDER_SITE_OTHER): Payer: Self-pay

## 2018-01-21 ENCOUNTER — Ambulatory Visit (INDEPENDENT_AMBULATORY_CARE_PROVIDER_SITE_OTHER): Payer: Self-pay | Admitting: Physician Assistant

## 2018-01-21 VITALS — BP 116/76 | HR 88 | Temp 98.4°F | Ht 69.0 in | Wt 279.2 lb

## 2018-01-21 DIAGNOSIS — I10 Essential (primary) hypertension: Secondary | ICD-10-CM

## 2018-01-21 NOTE — Progress Notes (Signed)
Nurse visit BP. Last BP 149/88 mmHg two weeks ago. Had run out of Prinzide but had refilled during office visit. Now taking Prinzide as directed. BP 116/76 mmHg today.

## 2018-02-04 ENCOUNTER — Emergency Department (HOSPITAL_COMMUNITY): Payer: Medicaid Other

## 2018-02-04 ENCOUNTER — Encounter (HOSPITAL_COMMUNITY): Payer: Self-pay | Admitting: Emergency Medicine

## 2018-02-04 ENCOUNTER — Emergency Department (HOSPITAL_COMMUNITY)
Admission: EM | Admit: 2018-02-04 | Discharge: 2018-02-04 | Disposition: A | Discharge status: Home / Self Care | Payer: Medicaid Other | Attending: Emergency Medicine | Admitting: Emergency Medicine

## 2018-02-04 DIAGNOSIS — S8991XA Unspecified injury of right lower leg, initial encounter: Secondary | ICD-10-CM | POA: Diagnosis present

## 2018-02-04 DIAGNOSIS — W010XXA Fall on same level from slipping, tripping and stumbling without subsequent striking against object, initial encounter: Secondary | ICD-10-CM | POA: Diagnosis not present

## 2018-02-04 DIAGNOSIS — Z79899 Other long term (current) drug therapy: Secondary | ICD-10-CM | POA: Diagnosis not present

## 2018-02-04 DIAGNOSIS — S8001XA Contusion of right knee, initial encounter: Secondary | ICD-10-CM | POA: Diagnosis not present

## 2018-02-04 DIAGNOSIS — Y999 Unspecified external cause status: Secondary | ICD-10-CM | POA: Diagnosis not present

## 2018-02-04 DIAGNOSIS — Z87891 Personal history of nicotine dependence: Secondary | ICD-10-CM | POA: Diagnosis not present

## 2018-02-04 DIAGNOSIS — Y92009 Unspecified place in unspecified non-institutional (private) residence as the place of occurrence of the external cause: Secondary | ICD-10-CM | POA: Diagnosis not present

## 2018-02-04 DIAGNOSIS — S7001XA Contusion of right hip, initial encounter: Secondary | ICD-10-CM | POA: Insufficient documentation

## 2018-02-04 DIAGNOSIS — Z7982 Long term (current) use of aspirin: Secondary | ICD-10-CM | POA: Insufficient documentation

## 2018-02-04 DIAGNOSIS — Y93H3 Activity, building and construction: Secondary | ICD-10-CM | POA: Diagnosis not present

## 2018-02-04 DIAGNOSIS — W19XXXA Unspecified fall, initial encounter: Secondary | ICD-10-CM

## 2018-02-04 HISTORY — DX: Unspecified osteoarthritis, unspecified site: M19.90

## 2018-02-04 NOTE — ED Triage Notes (Signed)
Per pt, states he fell about a month ago-PCP told him he had arthritis in right knee and hip-states he fell today injuring his right hip and knee

## 2018-02-04 NOTE — ED Provider Notes (Signed)
[facility]-EMERGENCY DEPT Provider Note   CSN: [id number] Arrival date & time: 02/04/18  1758     History   Chief Complaint Chief Complaint  Patient presents with  . Fall    HPI Robert Donovan is a 61 y.o. male with hx of TIA, per patient, who presents to the ED s/p fall that occurred about a month ago. Patient reports he saw his PCP and was told he had arthritis in the right knee and hip. Patient reports falling 2 days ago and injuring the right hip and knee again. Patient reports he was putting an access door up and the ground was unlevel causing him to fall. Patient reports continue pain and difficulty walking due to the pain.   HPI  Past Medical History:  Diagnosis Date  . Arthritis     There are no active problems to display for this patient.   No past surgical history on file.      Home Medications    Prior to Admission medications   Medication Sig Start Date End Date Taking? Authorizing Provider  aspirin EC 81 MG tablet Take 1 tablet (81 mg total) by mouth daily. 01/07/18   Loletta Specter, PA-C  atorvastatin (LIPITOR) 40 MG tablet Take 1 tablet (40 mg total) by mouth daily. 01/07/18   Loletta Specter, PA-C  lisinopril-hydrochlorothiazide (PRINZIDE,ZESTORETIC) 20-25 MG tablet Take 1 tablet by mouth daily. 01/07/18   Loletta Specter, PA-C    Family History No family history on file.  Social History Social History   Tobacco Use  . Smoking status: Former Games developer  . Smokeless tobacco: Never Used  Substance Use Topics  . Alcohol use: Not Currently  . Drug use: Not Currently    Comment: hx marijuana use     Allergies   Patient has no known allergies.   Review of Systems Review of Systems  Musculoskeletal: Positive for arthralgias.       Hip and right knee pain  All other systems reviewed and are negative.    Physical Exam Updated Vital Signs BP (!) 142/86 (BP Location: Right Arm)   Resp 15   SpO2 98%   Physical Exam    Constitutional: He appears well-developed and well-nourished. No distress.  HENT:  Head: Normocephalic.  Eyes: EOM are normal.  Neck: Neck supple.  Cardiovascular: Normal rate and intact distal pulses.  Pulmonary/Chest: Effort normal.  Musculoskeletal:       Right hip: He exhibits decreased range of motion and tenderness. Decreased strength: due to pain.       Right knee: He exhibits swelling. He exhibits no erythema and normal patellar mobility. Decreased range of motion: due to pain. Tenderness found.       Lumbar back: He exhibits tenderness.  Neurological: He is alert.  Skin: Skin is warm and dry.  Psychiatric: He has a normal mood and affect.  Nursing note and vitals reviewed.    ED Treatments / Results  Labs (all labs ordered are listed, but only abnormal results are displayed) Labs Reviewed - No data to display  Radiology Dg Lumbar Spine Complete  Result Date: 02/04/2018 CLINICAL DATA:  Per pt, states he fell about a month ago PCP told him he had arthritis in right knee and hip-states he fell today injuring his right hip and knee. Pt states he has midline lower back pain. EXAM: LUMBAR SPINE - COMPLETE 4+ VIEW COMPARISON:  09/24/2017 FINDINGS: There are mild degenerative changes in the LOWER thoracic and lumbar  spine. No acute fracture or subluxation. Visualized bowel gas pattern is nonobstructive. IMPRESSION: Degenerative changes.  No evidence for acute  abnormality. Electronically Signed   By: Norva Pavlov M.D.   On: 02/04/2018 22:11   Dg Knee Complete 4 Views Right  Result Date: 02/04/2018 CLINICAL DATA:  Fall 2 days ago.  Pain. EXAM: RIGHT KNEE - COMPLETE 4+ VIEW COMPARISON:  None. FINDINGS: No evidence of fracture, dislocation, or joint effusion. No evidence of arthropathy or other focal bone abnormality. Mild meniscal calcifications. Proximal fibular exostosis versus heterotopic ossification. Prepatellar soft tissue swelling without subcutaneous gas or radiopaque foreign  bodies. IMPRESSION: 1. Prepatellar soft tissue swelling without fracture deformity or dislocation. Electronically Signed   By: Awilda Metro M.D.   On: 02/04/2018 18:47   Dg Hip Unilat  With Pelvis 2-3 Views Right  Result Date: 02/04/2018 CLINICAL DATA:  Fall, pain. EXAM: DG HIP (WITH OR WITHOUT PELVIS) 2-3V RIGHT COMPARISON:  RIGHT hip radiograph September 24, 2017 FINDINGS: No acute fracture deformity or dislocation. Mild bilateral hip osteoarthrosis. Similar greater trochanter enthesopathy. No destructive bony lesions. Mild degenerative change of the hip. Faint calcification RIGHT femoral soft tissues, unchanged. IMPRESSION: 1. No acute fracture deformity or dislocation. 2. Stable mild bilateral hip osteoarthrosis. Electronically Signed   By: Awilda Metro M.D.   On: 02/04/2018 18:50    Procedures Procedures (including critical care time)  Medications Ordered in ED Medications - No data to display   Initial Impression / Assessment and Plan / ED Course  I have reviewed the triage vital signs and the nursing notes.  62 y.o. male with right hip and knee pain s/p fall stable for d/c without fracture or dislocation noted on x-ray. Knee immobilizer, walker and f/u with PCP. Patient agrees with plan.  Final Clinical Impressions(s) / ED Diagnoses   Final diagnoses:  Fall, initial encounter  Contusion of right hip, initial encounter  Contusion of right knee, initial encounter    ED Discharge Orders    None       Kerrie Buffalo [place], NP 02/04/18 2228    Melene Plan, DO 02/04/18 2303

## 2018-02-04 NOTE — Discharge Instructions (Addendum)
If you do not have insurance you can get a walker at [facility].

## 2018-02-08 ENCOUNTER — Encounter (INDEPENDENT_AMBULATORY_CARE_PROVIDER_SITE_OTHER): Payer: Self-pay | Admitting: Physician Assistant

## 2018-02-08 ENCOUNTER — Ambulatory Visit (INDEPENDENT_AMBULATORY_CARE_PROVIDER_SITE_OTHER): Payer: Self-pay | Admitting: Physician Assistant

## 2018-02-08 ENCOUNTER — Other Ambulatory Visit: Payer: Self-pay

## 2018-02-08 VITALS — BP 136/87 | HR 81 | Temp 97.7°F | Ht 69.0 in | Wt 285.2 lb

## 2018-02-08 DIAGNOSIS — I10 Essential (primary) hypertension: Secondary | ICD-10-CM

## 2018-02-08 DIAGNOSIS — M25561 Pain in right knee: Secondary | ICD-10-CM

## 2018-02-08 DIAGNOSIS — R4189 Other symptoms and signs involving cognitive functions and awareness: Secondary | ICD-10-CM

## 2018-02-08 DIAGNOSIS — Z5900 Homelessness unspecified: Secondary | ICD-10-CM

## 2018-02-08 DIAGNOSIS — Z8673 Personal history of transient ischemic attack (TIA), and cerebral infarction without residual deficits: Secondary | ICD-10-CM

## 2018-02-08 DIAGNOSIS — R4182 Altered mental status, unspecified: Secondary | ICD-10-CM

## 2018-02-08 DIAGNOSIS — M1611 Unilateral primary osteoarthritis, right hip: Secondary | ICD-10-CM

## 2018-02-08 MED ORDER — ACETAMINOPHEN-CODEINE #3 300-30 MG PO TABS: 1.0000 | ORAL_TABLET | Freq: Three times a day (TID) | ORAL | 0 refills | Status: DC | PRN

## 2018-02-08 MED FILL — ?ATORVASTATIN 40MG TABLET: 40 | 30 days supply | Qty: 30 | Fill #1

## 2018-02-08 MED FILL — ?LISINOPRIL-HCTZ 20/25 TAB: 20-25 | 30 days supply | Qty: 30 | Fill #1

## 2018-02-08 MED FILL — ACETAMINOPHEN/COD #3 TABLET: 300-30 | 14 days supply | Qty: 42 | Fill #0

## 2018-02-08 NOTE — Progress Notes (Signed)
Pt was recently seen in ED for a fall and suffered a contusion to his right hip and knee Pt mother states that patients speech has changed, states he used to speak very professional.

## 2018-02-08 NOTE — Patient Instructions (Addendum)
Stroke Prevention Some health problems and behaviors may make it more likely for you to have a stroke. Below are ways to lessen your risk of having a stroke.  Be active for at least 30 minutes on most or all days.  Do not smoke. Try not to be around others who smoke.  Do not drink too much alcohol. ? Do not have more than 2 drinks a day if you are a man. ? Do not have more than 1 drink a day if you are a woman and are not pregnant.  Eat healthy foods, such as fruits and vegetables. If you were put on a specific diet, follow the diet as told.  Keep your cholesterol levels under control through diet and medicines. Look for foods that are low in saturated fat, trans fat, cholesterol, and are high in fiber.  If you have diabetes, follow all diet plans and take your medicine as told.  Ask your doctor if you need treatment to lower your blood pressure. If you have high blood pressure (hypertension), follow all diet plans and take your medicine as told by your doctor.  If you are 86-21 years old, have your blood pressure checked every 3-5 years. If you are age 72 or older, have your blood pressure checked every year.  Keep a healthy weight. Eat foods that are low in calories, salt, saturated fat, trans fat, and cholesterol.  Do not take drugs.  Avoid birth control pills, if this applies. Talk to your doctor about the risks of taking birth control pills.  Talk to your doctor if you have sleep problems (sleep apnea).  Take all medicine as told by your doctor. ? You may be told to take aspirin or blood thinner medicine. Take this medicine as told by your doctor. ? Understand your medicine instructions.  Make sure any other conditions you have are being taken care of.  Get help right away if:  You suddenly lose feeling (you feel numb) or have weakness in your face, arm, or leg.  Your face or eyelid hangs down to one side.  You suddenly feel confused.  You have trouble talking  (aphasia) or understanding what people are saying.  You suddenly have trouble seeing in one or both eyes.  You suddenly have trouble walking.  You are dizzy.  You lose your balance or your movements are clumsy (uncoordinated).  You suddenly have a very bad headache and you do not know the cause.  You have new chest pain.  Your heart feels like it is fluttering or skipping a beat (irregular heartbeat). Do not wait to see if the symptoms above go away. Get help right away. Call your local emergency services (911 in [place] Do not drive yourself to the hospital. This information is not intended to replace advice given to you by your health care provider. Make sure you discuss any questions you have with your health care provider. Document Released: 11/18/2011 Document Revised: 10/25/2015 Document Reviewed: 11/19/2012 Elsevier Interactive Patient Education  2018 [employer]vier Inc[employer]   RICE for Routine Care of Injuries Many injuries can be cared for using rest, ice, compression, and elevation (RICE therapy). Using RICE therapy can help to lessen pain and swelling. It can help your body to heal. Rest Reduce your normal activities and avoid using the injured part of your body. You can go back to your normal activities when you feel okay and your doctor says it is okay. Ice Do not put ice on your bare skin.  Put ice in a plastic bag.  Place a towel between your skin and the bag.  Leave the ice on for 20 minutes, 2-3 times a day.  Do this for as long as told by your doctor. Compression Compression means putting pressure on the injured area. This can be done with an elastic bandage. If an elastic bandage has been applied:  Remove and reapply the bandage every 3-4 hours or as told by your doctor.  Make sure the bandage is not wrapped too tight. Wrap the bandage more loosely if part of your body beyond the bandage is blue, swollen, cold, painful, or loses feeling (numb).  See your doctor if  the bandage seems to make your problems worse.  Elevation Elevation means keeping the injured area raised. Raise the injured area above your heart or the center of your chest if you can. When should I get help? You should get help if:  You keep having pain and swelling.  Your symptoms get worse.  Get help right away if: You should get help right away if:  You have sudden bad pain at or below the area of your injury.  You have redness or more swelling around your injury.  You have tingling or numbness at or below the injury that does not go away when you take off the bandage.  This information is not intended to replace advice given to you by your health care provider. Make sure you discuss any questions you have with your health care provider. Document Released: 11/05/2007 Document Revised: 04/15/2016 Document Reviewed: 04/26/2014 Elsevier Interactive Patient Education  2017 [employer].

## 2018-02-08 NOTE — Progress Notes (Signed)
Subjective:  Patient ID: Robert Donovan, male    DOB: 09/04/1955  Age: 62 y.o. MRN: [id number]  CC: hospital f/u and cognition  HPI Tyree Waldenis a 62 y.o.malewith a medical history of HTN, multiple strokes, obesity, and OA of multiple joints presents on ED f/u. Went to ED four days ago after a fall that occurred more than a month ago with re-injury two days before ED visit. Complained of right hip and right knee pain. XR right hip revealed stable mild bilateral hip osteoarthrosis. XR right knee revealed prepatellar soft tissue swelling without fracture, deformity, or dislocation. Pt is still having much pain and difficulty standing and ambulating. Needs a walker to help support his weight while walking.     Mother and father have noticed a change in patient's speech and cognition since six months ago. He was taken to an outside provider and was found to have "mini strokes". Pt has been repeating himself and is less articulate according to his mother. Mother is concerned about patient falling and states he has fallen three times. Not taking aspirin as directed. May not being taking atorvastatin and Prinzide as directed. Pt denies CP, palpitations, PND, orthopnea. Endorsed claudication.     BP noted to be elevated today in comparison to his last reading of 116/76 mmHg 18 days ago. BP 136/87 mmHg today. Pt's mother states he has been eating high sodium canned foods. Patient has also lost his housing due to not paying rent secondary to unemployment. Sleeping in his car at the moment. Pt's mother is working with a program in the community to help pt obtain housing quickly.      Outpatient Medications Prior to Visit  Medication Sig Dispense Refill  . aspirin EC 81 MG tablet Take 1 tablet (81 mg total) by mouth daily. 90 tablet 3  . atorvastatin (LIPITOR) 40 MG tablet Take 1 tablet (40 mg total) by mouth daily. 30 tablet 11  . lisinopril-hydrochlorothiazide (PRINZIDE,ZESTORETIC) 20-25 MG tablet Take  1 tablet by mouth daily. 30 tablet 11   No facility-administered medications prior to visit.      ROS Review of Systems  Constitutional: Negative for chills, fever and malaise/fatigue.  Eyes: Negative for blurred vision.  Respiratory: Negative for shortness of breath.   Cardiovascular: Negative for chest pain and palpitations.  Gastrointestinal: Negative for abdominal pain and nausea.  Genitourinary: Negative for dysuria and hematuria.  Musculoskeletal: Negative for joint pain and myalgias.  Skin: Negative for rash.  Neurological: Negative for tingling and headaches.  Psychiatric/Behavioral: Negative for depression. The patient is not nervous/anxious.     Objective:  BP (!) 155/99 (BP Location: Right Arm, Patient Position: Sitting, Cuff Size: Large)   Pulse 81   Temp 97.7 F (36.5 C) (Oral)   Ht 5\' 9"  (1.753 m)   Wt 285 lb 3.2 oz (129.4 kg)   SpO2 94%   BMI 42.12 kg/m   Vitals:   02/08/18 1338 02/08/18 1414  BP: (!) 155/99 136/87  Pulse: 81   Temp: 97.7 F (36.5 C)   TempSrc: Oral   SpO2: 94%   Weight: 285 lb 3.2 oz (129.4 kg)   Height: 5\' 9"  (1.753 m)       Physical Exam  Constitutional: He is oriented to person, place, and time.  Well developed, obese, NAD, polite  HENT:  Head: Normocephalic and atraumatic.  Eyes: No scleral icterus.  Neck: Normal range of motion. Neck supple. No thyromegaly present.  Cardiovascular: Normal rate, regular rhythm and normal  heart sounds.  Pulmonary/Chest: Effort normal and breath sounds normal.  Musculoskeletal: He exhibits no edema.  Right hip and left knee with severely decrease aROM 2/2 pain.   Neurological: He is alert and oriented to person, place, and time.  Skin: Skin is warm and dry. No rash noted. No erythema. No pallor.  Psychiatric: He has a normal mood and affect. His behavior is normal. Thought content normal.  Cognition seems to be at baseline. No repeating, slowing, or slurring of speech observed during entire  encounter.    Vitals reviewed.    Assessment & Plan:   1. Cognitive decline - Not apparent in clinic. Seemed to be at baseline but mother and father assert patient is repeating himself and not as articulate as before.  - CT Head Wo Contrast; Future - Comprehensive metabolic panel - CBC with Differential - TSH - Hepatitis panel, acute - RPR - Sedimentation Rate - C-reactive protein - Lead, blood - ANA - Ambulatory referral to Neurology - EKG 12-Lead  2. Altered mental status, unspecified altered mental status type - Not apparent in clinic. Seemed to be at baseline but mother and father assert patient is repeating himself and not as articulate as before.  - CT Head Wo Contrast; Future - Comprehensive metabolic panel - CBC with Differential - TSH - Hepatitis panel, acute - RPR - Sedimentation Rate - C-reactive protein - Lead, blood - ANA - Ambulatory referral to Neurology - EKG 12-Lead  3. Uncontrolled hypertension - Patient is not likely taking his anti-hypertensive.  4. History of stroke - Pt non-compliant. Not taking aspirin. Also not likely taking anti-hypertensives and statin as directed. Pt was advised to recommence his medications to reduce his risk for stroke and MI.   5. Primary osteoarthritis of right hip - Begin acetaminophen-codeine (TYLENOL #3) 300-30 MG tablet; Take 1 tablet by mouth every 8 (eight) hours as needed for moderate pain.  Dispense: 42 tablet; Refill: 0 - AMB referral to orthopedics  6. Acute pain of right knee - Begin acetaminophen-codeine (TYLENOL #3) 300-30 MG tablet; Take 1 tablet by mouth every 8 (eight) hours as needed for moderate pain.  Dispense: 42 tablet; Refill: 0 - AMB referral to orthopedics  7. Homelessness - I spoke to [place] the housing coordinator for the area and I have asked for her to fax me paperwork that I may be able to fill out.  - I have messaged Mrs. Jenel Lucks to help with patient's housing  situation     Meds ordered this encounter  Medications  . acetaminophen-codeine (TYLENOL #3) 300-30 MG tablet    Sig: Take 1 tablet by mouth every 8 (eight) hours as needed for moderate pain.    Dispense:  42 tablet    Refill:  0    Order Specific Question:   Supervising Provider    Answer:   Hoy Register [[id number]]    Follow-up: Return in about 4 weeks (around 03/08/2018) for Cognitive decline.   Loletta Specter PA

## 2018-02-10 ENCOUNTER — Other Ambulatory Visit: Payer: Self-pay

## 2018-02-10 ENCOUNTER — Encounter (HOSPITAL_COMMUNITY): Payer: Self-pay | Admitting: Obstetrics and Gynecology

## 2018-02-10 ENCOUNTER — Telehealth: Payer: Self-pay | Admitting: Licensed Clinical Social Worker

## 2018-02-10 ENCOUNTER — Inpatient Hospital Stay (HOSPITAL_COMMUNITY)
Admission: EM | Admit: 2018-02-10 | Discharge: 2018-02-18 | DRG: 064 | Disposition: A | Discharge status: Skilled Nursing Facility | Payer: Medicaid Other | Source: Ambulatory Visit | Attending: Family Medicine | Admitting: Family Medicine

## 2018-02-10 ENCOUNTER — Emergency Department (HOSPITAL_COMMUNITY): Payer: Medicaid Other

## 2018-02-10 ENCOUNTER — Telehealth (INDEPENDENT_AMBULATORY_CARE_PROVIDER_SITE_OTHER): Payer: Self-pay

## 2018-02-10 DIAGNOSIS — I129 Hypertensive chronic kidney disease with stage 1 through stage 4 chronic kidney disease, or unspecified chronic kidney disease: Secondary | ICD-10-CM | POA: Diagnosis present

## 2018-02-10 DIAGNOSIS — G8313 Monoplegia of lower limb affecting right nondominant side: Secondary | ICD-10-CM | POA: Diagnosis not present

## 2018-02-10 DIAGNOSIS — M1990 Unspecified osteoarthritis, unspecified site: Secondary | ICD-10-CM | POA: Diagnosis present

## 2018-02-10 DIAGNOSIS — M25561 Pain in right knee: Secondary | ICD-10-CM

## 2018-02-10 DIAGNOSIS — N179 Acute kidney failure, unspecified: Secondary | ICD-10-CM | POA: Diagnosis present

## 2018-02-10 DIAGNOSIS — M1611 Unilateral primary osteoarthritis, right hip: Secondary | ICD-10-CM

## 2018-02-10 DIAGNOSIS — A539 Syphilis, unspecified: Secondary | ICD-10-CM | POA: Diagnosis not present

## 2018-02-10 DIAGNOSIS — R296 Repeated falls: Secondary | ICD-10-CM | POA: Diagnosis present

## 2018-02-10 DIAGNOSIS — E876 Hypokalemia: Secondary | ICD-10-CM | POA: Diagnosis not present

## 2018-02-10 DIAGNOSIS — Z87891 Personal history of nicotine dependence: Secondary | ICD-10-CM

## 2018-02-10 DIAGNOSIS — R4701 Aphasia: Secondary | ICD-10-CM | POA: Diagnosis present

## 2018-02-10 DIAGNOSIS — Z8249 Family history of ischemic heart disease and other diseases of the circulatory system: Secondary | ICD-10-CM | POA: Diagnosis not present

## 2018-02-10 DIAGNOSIS — I63412 Cerebral infarction due to embolism of left middle cerebral artery: Principal | ICD-10-CM | POA: Diagnosis present

## 2018-02-10 DIAGNOSIS — Z9181 History of falling: Secondary | ICD-10-CM

## 2018-02-10 DIAGNOSIS — R29707 NIHSS score 7: Secondary | ICD-10-CM | POA: Diagnosis present

## 2018-02-10 DIAGNOSIS — N182 Chronic kidney disease, stage 2 (mild): Secondary | ICD-10-CM | POA: Diagnosis not present

## 2018-02-10 DIAGNOSIS — Z833 Family history of diabetes mellitus: Secondary | ICD-10-CM | POA: Diagnosis not present

## 2018-02-10 DIAGNOSIS — R768 Other specified abnormal immunological findings in serum: Secondary | ICD-10-CM | POA: Diagnosis present

## 2018-02-10 DIAGNOSIS — G9341 Metabolic encephalopathy: Secondary | ICD-10-CM | POA: Diagnosis not present

## 2018-02-10 DIAGNOSIS — G9340 Encephalopathy, unspecified: Secondary | ICD-10-CM | POA: Diagnosis present

## 2018-02-10 DIAGNOSIS — A530 Latent syphilis, unspecified as early or late: Secondary | ICD-10-CM | POA: Diagnosis present

## 2018-02-10 DIAGNOSIS — N189 Chronic kidney disease, unspecified: Secondary | ICD-10-CM | POA: Diagnosis present

## 2018-02-10 DIAGNOSIS — I10 Essential (primary) hypertension: Secondary | ICD-10-CM | POA: Diagnosis present

## 2018-02-10 DIAGNOSIS — I63422 Cerebral infarction due to embolism of left anterior cerebral artery: Secondary | ICD-10-CM | POA: Diagnosis present

## 2018-02-10 DIAGNOSIS — R4182 Altered mental status, unspecified: Secondary | ICD-10-CM

## 2018-02-10 DIAGNOSIS — Z8619 Personal history of other infectious and parasitic diseases: Secondary | ICD-10-CM | POA: Diagnosis present

## 2018-02-10 DIAGNOSIS — I639 Cerebral infarction, unspecified: Secondary | ICD-10-CM

## 2018-02-10 DIAGNOSIS — K5900 Constipation, unspecified: Secondary | ICD-10-CM | POA: Diagnosis present

## 2018-02-10 DIAGNOSIS — R4189 Other symptoms and signs involving cognitive functions and awareness: Secondary | ICD-10-CM | POA: Diagnosis present

## 2018-02-10 DIAGNOSIS — M21371 Foot drop, right foot: Secondary | ICD-10-CM | POA: Diagnosis present

## 2018-02-10 DIAGNOSIS — Z6841 Body Mass Index (BMI) 40.0 and over, adult: Secondary | ICD-10-CM | POA: Diagnosis not present

## 2018-02-10 DIAGNOSIS — E785 Hyperlipidemia, unspecified: Secondary | ICD-10-CM | POA: Diagnosis not present

## 2018-02-10 DIAGNOSIS — Z590 Homelessness: Secondary | ICD-10-CM

## 2018-02-10 DIAGNOSIS — R4781 Slurred speech: Secondary | ICD-10-CM | POA: Diagnosis present

## 2018-02-10 DIAGNOSIS — Z7982 Long term (current) use of aspirin: Secondary | ICD-10-CM | POA: Diagnosis not present

## 2018-02-10 DIAGNOSIS — Z8673 Personal history of transient ischemic attack (TIA), and cerebral infarction without residual deficits: Secondary | ICD-10-CM | POA: Diagnosis present

## 2018-02-10 HISTORY — DX: Cerebral infarction, unspecified: I63.9

## 2018-02-10 HISTORY — DX: Pure hypercholesterolemia, unspecified: E78.00

## 2018-02-10 HISTORY — DX: Essential (primary) hypertension: I10

## 2018-02-10 LAB — BASIC METABOLIC PANEL
ANION GAP: 11 (ref 5–15)
BUN: 22 mg/dL — ABNORMAL HIGH (ref 6–20)
CALCIUM: 9.9 mg/dL (ref 8.9–10.3)
CO2: 24 mmol/L (ref 22–32)
Chloride: 106 mmol/L (ref 98–111)
Creatinine, Ser: 1.66 mg/dL — ABNORMAL HIGH (ref 0.61–1.24)
GFR calc Af Amer: 50 mL/min — ABNORMAL LOW (ref >60)
GFR, EST NON AFRICAN AMERICAN: 43 mL/min — AB (ref >60)
Glucose, Bld: 108 mg/dL — ABNORMAL HIGH (ref 70–99)
Potassium: 3.5 mmol/L (ref 3.5–5.1)
Sodium: 141 mmol/L (ref 135–145)

## 2018-02-10 LAB — URINALYSIS, ROUTINE W REFLEX MICROSCOPIC
Bilirubin Urine: NEGATIVE
Glucose, UA: NEGATIVE mg/dL
Hgb urine dipstick: NEGATIVE
Ketones, ur: 5 mg/dL — AB
LEUKOCYTES UA: NEGATIVE
NITRITE: NEGATIVE
PH: 5 (ref 5.0–8.0)
Protein, ur: NEGATIVE mg/dL
SPECIFIC GRAVITY, URINE: 1.025 (ref 1.005–1.030)

## 2018-02-10 LAB — CBC WITH DIFFERENTIAL/PLATELET
BASOS ABS: 0 10*3/uL (ref 0.0–0.1)
BASOS PCT: 0 %
BASOS: 1 %
Basophils Absolute: 0.1 10*3/uL (ref 0.0–0.2)
EOS (ABSOLUTE): 0.3 10*3/uL (ref 0.0–0.4)
Eos: 3 %
Eosinophils Absolute: 0.2 10*3/uL (ref 0.0–0.7)
Eosinophils Relative: 2 %
HEMATOCRIT: 48.6 % (ref 39.0–52.0)
HEMOGLOBIN: 16.2 g/dL (ref 13.0–17.0)
Hematocrit: 47.5 % (ref 37.5–51.0)
Hemoglobin: 15.3 g/dL (ref 13.0–17.7)
IMMATURE GRANS (ABS): 0.1 10*3/uL (ref 0.0–0.1)
Immature Granulocytes: 1 %
Lymphocytes Absolute: 2.5 10*3/uL (ref 0.7–3.1)
Lymphocytes Relative: 28 %
Lymphs Abs: 2.9 10*3/uL (ref 0.7–4.0)
Lymphs: 23 %
MCH: 28.2 pg (ref 26.6–33.0)
MCH: 29 pg (ref 26.0–34.0)
MCHC: 32.2 g/dL (ref 31.5–35.7)
MCHC: 33.3 g/dL (ref 30.0–36.0)
MCV: 88 fL (ref 79–97)
MCV: 87.1 fL (ref 78.0–100.0)
MONOS ABS: 0.7 10*3/uL (ref 0.1–0.9)
Monocytes Absolute: 0.7 10*3/uL (ref 0.1–1.0)
Monocytes Relative: 7 %
Monocytes: 6 %
NEUTROS ABS: 6.9 10*3/uL (ref 1.4–7.0)
NEUTROS ABS: 6.6 10*3/uL (ref 1.7–7.7)
Neutrophils Relative %: 63 %
Neutrophils: 66 %
Platelets: 253 10*3/uL (ref 150–450)
Platelets: 247 10*3/uL (ref 150–400)
RBC: 5.43 x10E6/uL (ref 4.14–5.80)
RBC: 5.58 MIL/uL (ref 4.22–5.81)
RDW: 13.4 % (ref 12.3–15.4)
RDW: 14.2 % (ref 11.5–15.5)
WBC: 10.5 10*3/uL (ref 3.4–10.8)
WBC: 10.5 10*3/uL (ref 4.0–10.5)

## 2018-02-10 LAB — COMPREHENSIVE METABOLIC PANEL
A/G RATIO: 1.6 (ref 1.2–2.2)
ALT: 12 IU/L (ref 0–44)
AST: 21 IU/L (ref 0–40)
Albumin: 4.4 g/dL (ref 3.6–4.8)
Alkaline Phosphatase: 102 IU/L (ref 39–117)
BUN/Creatinine Ratio: 11 (ref 10–24)
BUN: 15 mg/dL (ref 8–27)
Bilirubin Total: 1 mg/dL (ref 0.0–1.2)
CO2: 22 mmol/L (ref 20–29)
Calcium: 9.8 mg/dL (ref 8.6–10.2)
Chloride: 104 mmol/L (ref 96–106)
Creatinine, Ser: 1.34 mg/dL — ABNORMAL HIGH (ref 0.76–1.27)
GFR, EST AFRICAN AMERICAN: 66 mL/min/{1.73_m2} (ref >59)
GFR, EST NON AFRICAN AMERICAN: 57 mL/min/{1.73_m2} — AB (ref >59)
GLUCOSE: 76 mg/dL (ref 65–99)
Globulin, Total: 2.8 g/dL (ref 1.5–4.5)
POTASSIUM: 4 mmol/L (ref 3.5–5.2)
SODIUM: 143 mmol/L (ref 134–144)
TOTAL PROTEIN: 7.2 g/dL (ref 6.0–8.5)

## 2018-02-10 LAB — HEPATITIS PANEL, ACUTE
HEP B S AG: NEGATIVE
Hep A IgM: NEGATIVE
Hep B C IgM: NEGATIVE

## 2018-02-10 LAB — RPR, QUANT+TP ABS (REFLEX)
Rapid Plasma Reagin, Quant: 1:2 {titer} — ABNORMAL HIGH
TREPONEMA PALLIDUM AB: POSITIVE — AB

## 2018-02-10 LAB — RPR: RPR: REACTIVE — AB

## 2018-02-10 LAB — TSH: TSH: 1.62 u[IU]/mL (ref 0.450–4.500)

## 2018-02-10 LAB — SEDIMENTATION RATE: Sed Rate: 16 mm/hr (ref 0–30)

## 2018-02-10 LAB — LEAD, BLOOD (ADULT >= 16 YRS): Lead-Whole Blood: 1 ug/dL (ref 0–4)

## 2018-02-10 LAB — C-REACTIVE PROTEIN: CRP: 3 mg/L (ref 0–10)

## 2018-02-10 LAB — ANA: ANA: POSITIVE — AB

## 2018-02-10 MED ORDER — ACETAMINOPHEN 160 MG/5ML PO SOLN: 650.0000 mg | ORAL | Status: DC | PRN

## 2018-02-10 MED ORDER — ACETAMINOPHEN 325 MG PO TABS: 650.0000 mg | ORAL_TABLET | ORAL | Status: DC | PRN

## 2018-02-10 MED ORDER — ENOXAPARIN SODIUM 60 MG/0.6ML ~~LOC~~ SOLN
60.0000 mg | Freq: Every day | SUBCUTANEOUS | Status: DC
  Administered 2018-02-11: 60 mg via SUBCUTANEOUS
  Filled 2018-02-10: qty 0.6

## 2018-02-10 MED ORDER — ASPIRIN EC 81 MG PO TBEC
81.0000 mg | DELAYED_RELEASE_TABLET | Freq: Every day | ORAL | Status: DC
  Administered 2018-02-11 – 2018-02-18 (×8): 81 mg via ORAL
  Filled 2018-02-10 (×8): qty 1

## 2018-02-10 MED ORDER — SENNOSIDES-DOCUSATE SODIUM 8.6-50 MG PO TABS
1.0000 | ORAL_TABLET | Freq: Two times a day (BID) | ORAL | Status: DC
  Administered 2018-02-11 – 2018-02-18 (×11): 1 via ORAL
  Filled 2018-02-10 (×14): qty 1

## 2018-02-10 MED ORDER — SODIUM CHLORIDE 0.9 % IV BOLUS
500.0000 mL | Freq: Once | INTRAVENOUS | Status: AC
  Administered 2018-02-10: 500 mL via INTRAVENOUS

## 2018-02-10 MED ORDER — HYDRALAZINE HCL 20 MG/ML IJ SOLN: 10.0000 mg | INTRAMUSCULAR | Status: DC | PRN

## 2018-02-10 MED ORDER — ATORVASTATIN CALCIUM 40 MG PO TABS
40.0000 mg | ORAL_TABLET | Freq: Every day | ORAL | Status: DC
  Administered 2018-02-11 – 2018-02-18 (×8): 40 mg via ORAL
  Filled 2018-02-10 (×8): qty 1

## 2018-02-10 MED ORDER — SODIUM CHLORIDE 0.9 % IV SOLN
INTRAVENOUS | Status: DC
  Administered 2018-02-10 – 2018-02-11 (×4): via INTRAVENOUS

## 2018-02-10 MED ORDER — STROKE: EARLY STAGES OF RECOVERY BOOK
Freq: Once | Status: DC
  Filled 2018-02-10: qty 1

## 2018-02-10 MED ORDER — ACETAMINOPHEN-CODEINE #3 300-30 MG PO TABS
1.0000 | ORAL_TABLET | Freq: Three times a day (TID) | ORAL | Status: DC | PRN
  Administered 2018-02-14 – 2018-02-18 (×4): 1 via ORAL
  Filled 2018-02-10 (×4): qty 1

## 2018-02-10 MED ORDER — ACETAMINOPHEN 650 MG RE SUPP: 650.0000 mg | RECTAL | Status: DC | PRN

## 2018-02-10 NOTE — Telephone Encounter (Signed)
-----   Message from Loletta Specter, PA-C sent at 02/10/2018  1:07 PM EDT ----- I have called pt at 1303 hrs and left a message to call back urgently. Please try calling him this afternoon with the advise to go to the hospital to be checked for neurosyphilis (syphilis in the brain) given that he has a positive syphilis test and those around him have seen a cognitive decline in the patient.

## 2018-02-10 NOTE — Telephone Encounter (Signed)
Patient has been strongly advised to go to the hospital to be checked for neurosyphilis, given that he has a positive syphilis test and those around him have seen a cognitive decline in the patient. Maryjean Morn, CMA

## 2018-02-10 NOTE — Telephone Encounter (Signed)
Call placed to pt's mother, Robert Donovan. LCSWA introduced self and explained role at [facility]. LCSWA disclosed that she received a consult from PA-C Lily Kocher to address psychosocial stressors.   Ms. Robert Donovan disclosed that pt has ongoing medical conditions that have been escalating. Pt was informed that he needed to check in to the hospital ASAP by his doctor. Currently pt is homeless and is unable to walk. Ms. Robert Donovan shared that she and spouse are unable to continue to care for pt and needs assistance applying for Medicaid and placing pt in a skilled nursing facility.   LCSWA informed Ms. Robert Donovan of the [employer] and how to apply for Medicaid. She was strongly encouraged to speak with a Child psychotherapist, in the case, that pt is admitted to provide additional assistance.   Ms. Robert Donovan verbalized understanding and agreed to schedule an appointment with LCSWA, if needed.

## 2018-02-10 NOTE — ED Provider Notes (Signed)
[facility]-EMERGENCY DEPT Provider Note   CSN: [id number] Arrival date & time: 02/10/18  1621     History   Chief Complaint Chief Complaint  Patient presents with  . PCP Sent Pt    HPI Robert Donovan is a 62 y.o. male.  HPI   Patient is here for evaluation of positive blood test for syphilis, with history of falling and difficulty walking, present for several weeks.  No significant trauma from falls.  He was seen in the ED for evaluation of hip pain, after fall, 02/04/2018.  Subsequent to that he saw his PCP who did a battery of test, which returned showing the abnormality noted above.  He has not had brain imaging yet, to evaluate for sources of difficulty walking.  Patient reports he has had difficulty with his vision for several years and was unable to get my license for driving because of that.  He occasionally has headaches and did note he had one earlier today.  His family members are concerned that he is globally weak, having difficulty managing his affairs, and has been living in his car.  He is here in the ED with his mother and father, who are supportive.  Patient denies having fever recently; there is been no nausea, vomiting, change in bowel or urinary habits.  He states he had syphilis when he was a teenager and it was treated, several times.  He also states he was told he had syphilis in later years, and at that time took "penicillin," which someone gave him.  There are no other known modifying factors.  Past Medical History:  Diagnosis Date  . Arthritis   . High cholesterol   . Hypertension     There are no active problems to display for this patient.   History reviewed. No pertinent surgical history.      Home Medications    Prior to Admission medications   Medication Sig Start Date End Date Taking? Authorizing Provider  aspirin EC 81 MG tablet Take 1 tablet (81 mg total) by mouth daily. 01/07/18  Yes Loletta Specter, PA-C  atorvastatin  (LIPITOR) 40 MG tablet Take 1 tablet (40 mg total) by mouth daily. 01/07/18  Yes Loletta Specter, PA-C  lisinopril-hydrochlorothiazide (PRINZIDE,ZESTORETIC) 20-25 MG tablet Take 1 tablet by mouth daily. 01/07/18  Yes Loletta Specter, PA-C  acetaminophen-codeine (TYLENOL #3) 300-30 MG tablet Take 1 tablet by mouth every 8 (eight) hours as needed for moderate pain. 02/08/18   Loletta Specter, PA-C    Family History No family history on file.  Social History Social History   Tobacco Use  . Smoking status: Former Games developer  . Smokeless tobacco: Never Used  Substance Use Topics  . Alcohol use: Not Currently  . Drug use: Not Currently    Comment: hx marijuana use     Allergies   Patient has no known allergies.   Review of Systems Review of Systems  All other systems reviewed and are negative.    Physical Exam Updated Vital Signs BP (!) 172/98   Pulse (!) 56   Temp 98.3 F (36.8 C) (Oral)   Resp 18   SpO2 100%   Physical Exam  Constitutional: He is oriented to person, place, and time. He appears well-developed and well-nourished.  HENT:  Head: Normocephalic and atraumatic.  Right Ear: External ear normal.  Left Ear: External ear normal.  Eyes: Pupils are equal, round, and reactive to light. Conjunctivae and EOM are normal.  Neck: Normal range of motion and phonation normal. Neck supple.  Cardiovascular: Normal rate, regular rhythm and normal heart sounds.  Pulmonary/Chest: Effort normal and breath sounds normal. He exhibits no bony tenderness.  Abdominal: Soft. There is no tenderness.  Musculoskeletal: Normal range of motion.  Neurological: He is alert and oriented to person, place, and time. No cranial nerve deficit or sensory deficit. He exhibits normal muscle tone. Coordination normal.  No dysarthria, aphasia or nystagmus.  Moderate global weakness, but no focal asymmetry of strength.  Patient required help to sit in the stretcher but then was able to hold himself up,  by grasping the side rails.  Skin: Skin is warm, dry and intact.  Psychiatric: He has a normal mood and affect. His behavior is normal. Judgment and thought content normal.  Nursing note and vitals reviewed.    ED Treatments / Results  Labs (all labs ordered are listed, but only abnormal results are displayed) Labs Reviewed  BASIC METABOLIC PANEL - Abnormal; Notable for the following components:      Result Value   Glucose, Bld 108 (*)    BUN 22 (*)    Creatinine, Ser 1.66 (*)    GFR calc non Af Amer 43 (*)    GFR calc Af Amer 50 (*)    All other components within normal limits  URINALYSIS, ROUTINE W REFLEX MICROSCOPIC - Abnormal; Notable for the following components:   Ketones, ur 5 (*)    All other components within normal limits  CSF CULTURE  GRAM STAIN  CBC WITH DIFFERENTIAL/PLATELET  CSF CELL COUNT WITH DIFFERENTIAL  CSF CELL COUNT WITH DIFFERENTIAL  GLUCOSE, CSF  PROTEIN, CSF  VDRL, CSF    EKG None  Radiology Ct Head Wo Contrast  Result Date: 02/10/2018 CLINICAL DATA:  Altered mental status EXAM: CT HEAD WITHOUT CONTRAST TECHNIQUE: Contiguous axial images were obtained from the base of the skull through the vertex without intravenous contrast. COMPARISON:  09/24/2017 CT FINDINGS: Brain: No acute territorial infarction, hemorrhage or intracranial mass. More focal hypodensity within the left subcortical white matter, new since 09/24/2017 comparison CT. Chronic infarcts within the left thalamus and pons. Moderate small vessel ischemic changes of the white matter. Stable ventricle size. Vascular: No hyperdense vessels.  Carotid vascular calcification Skull: Normal. Negative for fracture or focal lesion. Sinuses/Orbits: No acute finding. Other: None IMPRESSION: 1. Focal hypodensity within the left parasagittal frontal lobe subcortical white matter, which may reflect age indeterminate white matter infarct or possible small focus of edema. This is new since 09/24/2017. 2.  Negative for acute hemorrhage. 3. Atrophy with moderate small vessel ischemic changes of the white matter 4. Chronic lacunar infarcts in the pons and left thalamus. Electronically Signed   By: Jasmine Pang M.D.   On: 02/10/2018 18:40    Procedures .Lumbar Puncture Date/Time: 02/10/2018 10:08 PM Performed by: Mancel Bale, MD Authorized by: Mancel Bale, MD   Consent:    Consent obtained:  Written   Consent given by:  Patient   Risks discussed:  Bleeding, infection and headache   Alternatives discussed:  No treatment Pre-procedure details:    Procedure purpose:  Diagnostic   Preparation: Patient was prepped and draped in usual sterile fashion   Anesthesia (see MAR for exact dosages):    Anesthesia method:  Local infiltration   Local anesthetic:  Lidocaine 1% w/o epi Procedure details:    Lumbar space:  L3-L4 interspace   Patient position:  L lateral decubitus   Needle gauge:  20  Needle type:  Spinal needle - Quincke tip   Ultrasound guidance: no     Number of attempts:  1   Fluid appearance: No fluid obtained.   Total volume (ml): 0. Post-procedure:    Puncture site:  Adhesive bandage applied Comments:     3 and half inch final needle was used, it was introduced to the hilt without return of spinal fluid.  No bony impediment was reached.  Patient had difficulty positioning himself with lumbar flexion and hip flexion, secondary to low back pain and left knee pain.  He related this pain to his "arthritis." .Critical Care Performed by: Mancel Bale, MD Authorized by: Mancel Bale, MD   Critical care provider statement:    Critical care time (minutes):  35   Critical care start time:  02/10/2018 4:55 PM   Critical care end time:  02/10/2018 10:26 PM   Critical care time was exclusive of:  Separately billable procedures and treating other patients   Critical care was necessary to treat or prevent imminent or life-threatening deterioration of the following conditions:  CNS  failure or compromise   Critical care was time spent personally by me on the following activities:  Blood draw for specimens, development of treatment plan with patient or surrogate, discussions with consultants, evaluation of patient's response to treatment, examination of patient, obtaining history from patient or surrogate, ordering and performing treatments and interventions, ordering and review of laboratory studies, pulse oximetry, re-evaluation of patient's condition, review of old charts and ordering and review of radiographic studies   (including critical care time)  Medications Ordered in ED Medications  0.9 %  sodium chloride infusion ( Intravenous New Bag/Given 02/10/18 1742)  sodium chloride 0.9 % bolus 500 mL (0 mLs Intravenous Stopped 02/10/18 2216)     Initial Impression / Assessment and Plan / ED Course  I have reviewed the triage vital signs and the nursing notes.  Pertinent labs & imaging results that were available during my care of the patient were reviewed by me and considered in my medical decision making (see chart for details).  Clinical Course as of Feb 11 2223  Wed Feb 10, 2018  2211 Normal  CBC with Differential [EW]  2211 Normal  Urinalysis, Routine w reflex microscopic(!) [EW]  2211 Normal except elevated glucose, BUN and creatinine  Basic metabolic panel(!) [EW]  2212 Nondiagnostic for acute infarct, left-sided lacunar infarcts present.  Images reviewed by me   [EW]  2221 Case discussed with neurology, Dr. Otelia Limes.  He recommends proceeding with neurosyphilis evaluation with MRI with and without contrast to evaluate the hypodensity in the left white matter.  He states patient can stay at this facility, unless a stroke is proven on MRI imaging.  He recommends proceeding with ID consultation prior to initiation of antibiotic therapy for possible neurosyphilis.   [EW]    Clinical Course User Index [EW] Mancel Bale, MD     Patient Vitals for the past 24  hrs:  BP Temp Temp src Pulse Resp SpO2  02/10/18 2215 - - - (!) 56 - 100 %  02/10/18 2100 (!) 172/98 - - 71 - 100 %  02/10/18 1930 (!) 151/93 98.3 F (36.8 C) Oral 66 18 98 %  02/10/18 1919 126/80 - - 99 17 99 %  02/10/18 1631 124/78 98.9 F (37.2 C) - (!) 102 16 97 %    10:24 PM Reevaluation with update and discussion. After initial assessment and treatment, an updated evaluation reveals patient comfortable  after attempted lumbar puncture.  No change in clinical status.  Patient updated on findings and plan. Mancel Bale   Medical Decision Making: Nonspecific mental status changes, with apparent prior lacunar strokes and new hypodensity, left white matter of the brain.  Unable to obtain lumbar puncture spinal fluid in the ED.  Patient debilitated secondary to low back pain and left knee pain.  Is apparently homeless as well.  CRITICAL CARE-yes Performed by: Mancel Bale  Nursing Notes Reviewed/ Care Coordinated Applicable Imaging Reviewed Interpretation of Laboratory Data incorporated into ED treatment  10:26 PM-Consult complete with hospitalist. Patient case explained and discussed.  He agrees to admit patient for further evaluation and treatment. Call ended at 10:41 PM  Plan: Admit  Final Clinical Impressions(s) / ED Diagnoses   Final diagnoses:  Altered mental status, unspecified altered mental status type  Syphilis  AKI (acute kidney injury) [facility])    ED Discharge Orders    None       Mancel Bale, MD 02/12/18 1943

## 2018-02-10 NOTE — ED Notes (Signed)
Assisting provider with lumbar puncture.

## 2018-02-10 NOTE — ED Notes (Signed)
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

## 2018-02-10 NOTE — H&P (Signed)
History and Physical    Robert Donovan ZOX:[id number] DOB: [birth year] DOA: 02/10/2018  Referring MD/NP/PA: Daleen Bo, MD PCP: Clent Demark, PA-C  Patient coming from: Home  Chief Complaint: PCP sent in for positive blood test for syphilis  I have personally briefly reviewed patient's old medical records in [place]   HPI: Robert Donovan is a 62 y.o. male with medical history significant of HTN, HLD, CVA, obesity, and osteoarthritis; who presented at the direction of his primary care provider after positive blood test for syphilis during his last evaluation.  History is obtained from the patient and family present at bedside.  Apparently, patient reports having contracted syphilis at the age of 64 or 31 and reports that he was treated 2 times at a clinic, and also took penicillin pills that he obtained on its own on four or more separate occasions years ago.  Over the last 4 to 5 months he has had a progressive decline.  He normally speaks very proper, but has had worsening slurred speech and will repeat himself.  Over the last few months he has had difficulties with right foot dragging and inability to lift it up causing him to have had several falls.  Last fall occurred 6 days ago for which he was seen in the emergency department, but ultimately discharged home imaging studies revealed no acute fracture.  At baseline patient has osteoarthritis for which he is been taking Tylenol 3 for pain relief.  He was followed up by his primary care provider a few days later, and as a result of several issues had multiple tests performed.  Lab results noted positive ANA, positive T. pallidium Abs, and rapid plasma antigen quantitative 1:2.  CRP and ESR were within normal limits.  Other associated symptoms include constipation, headache, and decreased vision which is not a new symptom.  Because of patient's symptoms he has been unable to ambulate or care for himself with intermittent incontinence.   Patient's parents are present at bedside and appear to be supportive as they have been trying to get him assistance.  ED Course: Upon admission to the emergency department patient was seen to be afebrile, pulse 56-1 02, blood pressure 124/78 172/98, and O2 saturation maintained on room air.  Labs revealed WBC 10.5, BUN 22, and creatinine 1.66.  CT imaging of the head without contrast revealed focal hypodensity of the left parasagittal frontal lobe in the subcortical white matter which was new when compared to previous imaging from April of this year.  Neurology was consulted and recommended admission with MRI imaging.  A lumbar puncture was not able to be obtained by the ED provider.  TRH called to admit.  Review of Systems  Constitutional: Negative for chills, diaphoresis, fever and weight loss.  HENT: Negative for ear discharge and nosebleeds.   Eyes: Negative for photophobia and pain.  Respiratory: Negative for cough and shortness of breath.   Cardiovascular: Negative for chest pain and orthopnea.  Gastrointestinal: Positive for constipation. Negative for nausea and vomiting.  Genitourinary: Negative for dysuria and hematuria.  Musculoskeletal: Positive for falls and joint pain.  Neurological: Positive for speech change, focal weakness, weakness and headaches. Negative for loss of consciousness.  Psychiatric/Behavioral: Negative for hallucinations and substance abuse.    Past Medical History:  Diagnosis Date  . Arthritis   . High cholesterol   . Hypertension     History reviewed. No pertinent surgical history.   reports that he has quit smoking. He has never used  smokeless tobacco. He reports that he drank alcohol. He reports that he has current or past drug history.  No Known Allergies  No family history on file.  Prior to Admission medications   Medication Sig Start Date End Date Taking? Authorizing Provider  aspirin EC 81 MG tablet Take 1 tablet (81 mg total) by mouth daily.  01/07/18  Yes Clent Demark, PA-C  atorvastatin (LIPITOR) 40 MG tablet Take 1 tablet (40 mg total) by mouth daily. 01/07/18  Yes Clent Demark, PA-C  lisinopril-hydrochlorothiazide (PRINZIDE,ZESTORETIC) 20-25 MG tablet Take 1 tablet by mouth daily. 01/07/18  Yes Clent Demark, PA-C  acetaminophen-codeine (TYLENOL #3) 300-30 MG tablet Take 1 tablet by mouth every 8 (eight) hours as needed for moderate pain. 02/08/18   Clent Demark, PA-C    Physical Exam:  Constitutional: NAD, calm, comfortable Vitals:   02/10/18 1919 02/10/18 1930 02/10/18 2100 02/10/18 2215  BP: 126/80 (!) 151/93 (!) 172/98   Pulse: 99 66 71 (!) 56  Resp: 17 18    Temp:  98.3 F (36.8 C)    TempSrc:  Oral    SpO2: 99% 98% 100% 100%   Eyes: PERRL, lids and conjunctivae normal ENMT: Mucous membranes are moist. Posterior pharynx clear of any exudate or lesions. Poor dentition.  Neck: normal, supple, no masses, no thyromegaly Respiratory: clear to auscultation bilaterally, no wheezing, no crackles. Normal respiratory effort. No accessory muscle use.  Cardiovascular: Regular rate and rhythm, no murmurs / rubs / gallops. No extremity edema. 2+ pedal pulses. No carotid bruits.  Abdomen: no tenderness, no masses palpated. No hepatosplenomegaly. Bowel sounds positive.  Musculoskeletal: no clubbing / cyanosis. No joint deformity upper and lower extremities. Good ROM, no contractures. Normal muscle tone.  Skin: no rashes, lesions, ulcers. No induration Neurologic: CN 2-12 grossly intact. Sensation intact, DTR normal. Strength 3/5 in the right lower extremity and 5 out of 5 in all other extremities.  Patient with slurred speech..  Psychiatric: Normal judgment and insight. Alert and oriented x 3. Normal mood.     Labs on Admission: I have personally reviewed following labs and imaging studies  CBC: Recent Labs  Lab 02/08/18 1506 02/10/18 1722  WBC 10.5 10.5  NEUTROABS 6.9 6.6  HGB 15.3 16.2  HCT 47.5 48.6    MCV 88 87.1  PLT 253 818   Basic Metabolic Panel: Recent Labs  Lab 02/08/18 1506 02/10/18 1722  NA 143 141  K 4.0 3.5  CL 104 106  CO2 22 24  GLUCOSE 76 108*  BUN 15 22*  CREATININE 1.34* 1.66*  CALCIUM 9.8 9.9   GFR: Estimated Creatinine Clearance: 63.1 mL/min (A) (by C-G formula based on SCr of 1.66 mg/dL (H)). Liver Function Tests: Recent Labs  Lab 02/08/18 1506  AST 21  ALT 12  ALKPHOS 102  BILITOT 1.0  PROT 7.2  ALBUMIN 4.4   No results for input(s): LIPASE, AMYLASE in the last 168 hours. No results for input(s): AMMONIA in the last 168 hours. Coagulation Profile: No results for input(s): INR, PROTIME in the last 168 hours. Cardiac Enzymes: No results for input(s): CKTOTAL, CKMB, CKMBINDEX, TROPONINI in the last 168 hours. BNP (last 3 results) No results for input(s): PROBNP in the last 8760 hours. HbA1C: No results for input(s): HGBA1C in the last 72 hours. CBG: No results for input(s): GLUCAP in the last 168 hours. Lipid Profile: No results for input(s): CHOL, HDL, LDLCALC, TRIG, CHOLHDL, LDLDIRECT in the last 72 hours. Thyroid Function Tests:  Recent Labs    02/08/18 1506  TSH 1.620   Anemia Panel: No results for input(s): VITAMINB12, FOLATE, FERRITIN, TIBC, IRON, RETICCTPCT in the last 72 hours. Urine analysis:    Component Value Date/Time   COLORURINE YELLOW 02/10/2018 1722   APPEARANCEUR CLEAR 02/10/2018 1722   LABSPEC 1.025 02/10/2018 1722   PHURINE 5.0 02/10/2018 1722   GLUCOSEU NEGATIVE 02/10/2018 1722   HGBUR NEGATIVE 02/10/2018 1722   BILIRUBINUR NEGATIVE 02/10/2018 1722   KETONESUR 5 (A) 02/10/2018 1722   PROTEINUR NEGATIVE 02/10/2018 1722   NITRITE NEGATIVE 02/10/2018 1722   LEUKOCYTESUR NEGATIVE 02/10/2018 1722   Sepsis Labs: No results found for this or any previous visit (from the past 240 hour(s)).   Radiological Exams on Admission: Ct Head Wo Contrast  Result Date: 02/10/2018 CLINICAL DATA:  Altered mental status EXAM:  CT HEAD WITHOUT CONTRAST TECHNIQUE: Contiguous axial images were obtained from the base of the skull through the vertex without intravenous contrast. COMPARISON:  09/24/2017 CT FINDINGS: Brain: No acute territorial infarction, hemorrhage or intracranial mass. More focal hypodensity within the left subcortical white matter, new since 09/24/2017 comparison CT. Chronic infarcts within the left thalamus and pons. Moderate small vessel ischemic changes of the white matter. Stable ventricle size. Vascular: No hyperdense vessels.  Carotid vascular calcification Skull: Normal. Negative for fracture or focal lesion. Sinuses/Orbits: No acute finding. Other: None IMPRESSION: 1. Focal hypodensity within the left parasagittal frontal lobe subcortical white matter, which may reflect age indeterminate white matter infarct or possible small focus of edema. This is new since 09/24/2017. 2. Negative for acute hemorrhage. 3. Atrophy with moderate small vessel ischemic changes of the white matter 4. Chronic lacunar infarcts in the pons and left thalamus. Electronically Signed   By: Donavan Foil M.D.   On: 02/10/2018 18:40    EKG: Independently reviewed.  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  Patient found to have positive ANA, positive T. pallidium Abs, and rapid plasma antigen quantitative 1:2.  CT scan shows new focal hypodensity of the left parasagittal frontal lobe.  Neurology was consulted and will evaluate the patient.  LP unable to be obtained in the emergency department. - Admit to telemetry bed -TIA/stroke order set initiated - Neurochecks - Check MRI of the brain - PT/OT/speech to eval and treat - Interventional radiology consult for need of LP in a.m.  Acute kidney injury superimposed on chronic kidney disease stage 2: Patient presents with a creatinine of 1.66  with BUN 22.  Patient on diuretics of hydrochlorothiazide  - IV fluids normal saline at 125 mL/h overnight - Hold nephrotoxic agents - Recheck kidney function in a.m.  History of syphilis: Patient reports contracting syphilis at the age of 51-18.  Reports previous treatment twice in clinic and multiple times obtaining penicillin pills and taking them.  Constipation: Acute.  Last bowel movement 2 days ago. - Senna  Osteoarthritis - Continue Tylenol 3 as needed  Essential hypertension: Blood pressure elevated up to 177/96 on admission.  - Hold lisinopril-hydrochlorothiazide combination pill due to acute kidney injury - Hydralazine prn  Homelessness/lack of insurance, patient reports losing job and insurance and currently being homeless. - Social work consult  DVT prophylaxis: Lovenox   Code Status: Full  Family Communication: Discussed plan of care with the patient family present at bedside Disposition Plan: TBD Consults called: neurology Admission status: Inpatient   Norval Morton MD  [facility] Pager [phone]   If 7PM-7AM, please contact night-coverage [email or web address] Password [facility]  02/10/2018, 10:34 PM

## 2018-02-10 NOTE — ED Notes (Signed)
ED TO INPATIENT HANDOFF REPORT  Name/Age/Gender Robert Donovan 62 y.o. male  Code Status    Code Status Orders  (From admission, onward)         Start     Ordered   02/10/18 2326  Full code  Continuous     02/10/18 2327        Code Status History    This patient has a current code status but no historical code status.      Home/SNF/Other Home  Chief Complaint PCP sent by found abnormal labs   Level of Care/Admitting Diagnosis ED Disposition    ED Disposition Condition [place]: [place] [[id number]]  Level of Care: Telemetry [5]  Admit to tele based on following criteria: Complex arrhythmia (Bradycardia/Tachycardia)  Diagnosis: Acute encephalopathy [088110]  Admitting Physician: Norval Morton [[id number]]  Attending Physician: Norval Morton [[id number]]  Estimated length of stay: past midnight tomorrow  Certification:: I certify this patient will need inpatient services for at least 2 midnights  PT Class (Do Not Modify): Inpatient [101]  PT Acc Code (Do Not Modify): Private [1]       Medical History Past Medical History:  Diagnosis Date  . Arthritis   . High cholesterol   . Hypertension     Allergies No Known Allergies  IV Location/Drains/Wounds Patient Lines/Drains/Airways Status   Active Line/Drains/Airways    Name:   Placement date:   Placement time:   Site:   Days:   Peripheral IV 02/10/18 Left Hand   02/10/18    1722    Hand   less than 1          Labs/Imaging Results for orders placed or performed during the hospital encounter of 02/10/18 (from the past 48 hour(s))  Basic metabolic panel     Status: Abnormal   Collection Time: 02/10/18  5:22 PM  Result Value Ref Range   Sodium 141 135 - 145 mmol/L   Potassium 3.5 3.5 - 5.1 mmol/L   Chloride 106 98 - 111 mmol/L   CO2 24 22 - 32 mmol/L   Glucose, Bld 108 (H) 70 - 99 mg/dL   BUN 22 (H) 6 - 20 mg/dL   Creatinine, Ser 1.66 (H) 0.61 - 1.24 mg/dL   Calcium 9.9 8.9 - 10.3 mg/dL   GFR calc non Af Amer 43 (L) >60 mL/min   GFR calc Af Amer 50 (L) >60 mL/min    Comment: (NOTE) The eGFR has been calculated using the CKD EPI equation. This calculation has not been validated in all clinical situations. eGFR's persistently <60 mL/min signify possible Chronic Kidney Disease.    Anion gap 11 5 - 15    Comment: Performed at [facility], [place] [address], [place], [place] [address]  CBC with Differential     Status: None   Collection Time: 02/10/18  5:22 PM  Result Value Ref Range   WBC 10.5 4.0 - 10.5 K/uL   RBC 5.58 4.22 - 5.81 MIL/uL   Hemoglobin 16.2 13.0 - 17.0 g/dL   HCT 48.6 39.0 - 52.0 %   MCV 87.1 78.0 - 100.0 fL   MCH 29.0 26.0 - 34.0 pg   MCHC 33.3 30.0 - 36.0 g/dL   RDW 14.2 11.5 - 15.5 %   Platelets 247 150 - 400 K/uL   Neutrophils Relative % 63 %   Neutro Abs 6.6 1.7 - 7.7 K/uL   Lymphocytes Relative 28 %  Lymphs Abs 2.9 0.7 - 4.0 K/uL   Monocytes Relative 7 %   Monocytes Absolute 0.7 0.1 - 1.0 K/uL   Eosinophils Relative 2 %   Eosinophils Absolute 0.2 0.0 - 0.7 K/uL   Basophils Relative 0 %   Basophils Absolute 0.0 0.0 - 0.1 K/uL    Comment: Performed at [facility], [place] [address] [place], [place] [address]  Urinalysis, Routine w reflex microscopic     Status: Abnormal   Collection Time: 02/10/18  5:22 PM  Result Value Ref Range   Color, Urine YELLOW YELLOW   APPearance CLEAR CLEAR   Specific Gravity, Urine 1.025 1.005 - 1.030   pH 5.0 5.0 - 8.0   Glucose, UA NEGATIVE NEGATIVE mg/dL   Hgb urine dipstick NEGATIVE NEGATIVE   Bilirubin Urine NEGATIVE NEGATIVE   Ketones, ur 5 (A) NEGATIVE mg/dL   Protein, ur NEGATIVE NEGATIVE mg/dL   Nitrite NEGATIVE NEGATIVE   Leukocytes, UA NEGATIVE NEGATIVE    Comment: Performed at [facility] [address], [place], [place] [address]   Ct Head Wo Contrast  Result Date: 02/10/2018 CLINICAL DATA:  Altered mental  status EXAM: CT HEAD WITHOUT CONTRAST TECHNIQUE: Contiguous axial images were obtained from the base of the skull through the vertex without intravenous contrast. COMPARISON:  09/24/2017 CT FINDINGS: Brain: No acute territorial infarction, hemorrhage or intracranial mass. More focal hypodensity within the left subcortical white matter, new since 09/24/2017 comparison CT. Chronic infarcts within the left thalamus and pons. Moderate small vessel ischemic changes of the white matter. Stable ventricle size. Vascular: No hyperdense vessels.  Carotid vascular calcification Skull: Normal. Negative for fracture or focal lesion. Sinuses/Orbits: No acute finding. Other: None IMPRESSION: 1. Focal hypodensity within the left parasagittal frontal lobe subcortical white matter, which may reflect age indeterminate white matter infarct or possible small focus of edema. This is new since 09/24/2017. 2. Negative for acute hemorrhage. 3. Atrophy with moderate small vessel ischemic changes of the white matter 4. Chronic lacunar infarcts in the pons and left thalamus. Electronically Signed   By: Donavan Foil M.D.   On: 02/10/2018 18:40    Pending Labs Unresulted Labs (From admission, onward)    Start     Ordered   02/11/18 0500  Hemoglobin A1c  Tomorrow morning,   R     02/10/18 2327   02/11/18 0500  Lipid panel  Tomorrow morning,   R    Comments:  Fasting    02/10/18 2327   02/10/18 2326  Urine rapid drug screen (hosp performed)not at [facility]  Once,   R     02/10/18 2327   02/10/18 1911  CSF cell count with differential collection tube #: 1  [facility] ED ADULT CSF PANEL)  STAT,   STAT    Question:  collection tube #  Answer:  1   02/10/18 1911   02/10/18 1911  CSF cell count with differential collection tube #: 4  [facility] ED ADULT CSF PANEL)  STAT,   STAT    Question:  collection tube #  Answer:  4   02/10/18 1911   02/10/18 1911  CSF culture  [facility] ED ADULT CSF PANEL)  STAT,   STAT    Question:  Are there also cytology or  pathology orders on this specimen?  Answer:  No   02/10/18 1911   02/10/18 1911  Gram stain  [facility] ED ADULT CSF PANEL)  STAT,   STAT     02/10/18  1911   02/10/18 1911  Glucose, CSF  [facility] ED ADULT CSF PANEL)  STAT,   STAT     02/10/18 1911   02/10/18 1911  Protein, CSF  [facility] ED ADULT CSF PANEL)  STAT,   STAT     02/10/18 1911   02/10/18 1911  VDRL, CSF  ([facility] ED ADULT CSF PANEL)  STAT,   STAT     02/10/18 1911          Vitals/Pain Today's Vitals   02/10/18 2100 02/10/18 2215 02/10/18 2247 02/10/18 2300  BP: (!) 172/98  (!) 175/80 (!) 150/82  Pulse: 71 (!) 56 (!) 57 (!) 56  Resp:   18   Temp:      TempSrc:      SpO2: 100% 100% 97% 100%  PainSc:        Isolation Precautions No active isolations  Medications Medications  0.9 %  sodium chloride infusion ( Intravenous New Bag/Given 02/10/18 1742)  atorvastatin (LIPITOR) tablet 40 mg (has no administration in time range)  aspirin EC tablet 81 mg (has no administration in time range)  acetaminophen-codeine (TYLENOL #3) 300-30 MG per tablet 1 tablet (has no administration in time range)   stroke: mapping our early stages of recovery book (has no administration in time range)  acetaminophen (TYLENOL) tablet 650 mg (has no administration in time range)    Or  acetaminophen (TYLENOL) solution 650 mg (has no administration in time range)    Or  acetaminophen (TYLENOL) suppository 650 mg (has no administration in time range)  senna-docusate (Senokot-S) tablet 1 tablet (has no administration in time range)  enoxaparin (LOVENOX) injection 40 mg (has no administration in time range)  sodium chloride 0.9 % bolus 500 mL (0 mLs Intravenous Stopped 02/10/18 2216)    Mobility manual wheelchair

## 2018-02-10 NOTE — ED Triage Notes (Signed)
Per Pt: Pt is being sent from PCP. Pt's PCP told him to get to the ED right now as "they found syphilis on his brain". Pt has had multiple falls and AMS in the past Dr. Lily Kocher at the [facility]

## 2018-02-10 NOTE — ED Notes (Signed)
Provided patient a urinal for a urine sample.

## 2018-02-10 NOTE — ED Notes (Signed)
Patient transported to CT 

## 2018-02-11 ENCOUNTER — Inpatient Hospital Stay (HOSPITAL_COMMUNITY): Payer: Medicaid Other

## 2018-02-11 ENCOUNTER — Other Ambulatory Visit: Payer: Self-pay

## 2018-02-11 DIAGNOSIS — I639 Cerebral infarction, unspecified: Secondary | ICD-10-CM | POA: Diagnosis present

## 2018-02-11 DIAGNOSIS — N179 Acute kidney failure, unspecified: Secondary | ICD-10-CM | POA: Diagnosis present

## 2018-02-11 DIAGNOSIS — Z8619 Personal history of other infectious and parasitic diseases: Secondary | ICD-10-CM | POA: Diagnosis present

## 2018-02-11 DIAGNOSIS — I10 Essential (primary) hypertension: Secondary | ICD-10-CM | POA: Diagnosis present

## 2018-02-11 DIAGNOSIS — K5900 Constipation, unspecified: Secondary | ICD-10-CM | POA: Diagnosis present

## 2018-02-11 DIAGNOSIS — N189 Chronic kidney disease, unspecified: Secondary | ICD-10-CM

## 2018-02-11 DIAGNOSIS — Z8673 Personal history of transient ischemic attack (TIA), and cerebral infarction without residual deficits: Secondary | ICD-10-CM | POA: Diagnosis present

## 2018-02-11 DIAGNOSIS — M1990 Unspecified osteoarthritis, unspecified site: Secondary | ICD-10-CM | POA: Diagnosis present

## 2018-02-11 DIAGNOSIS — I5030 Unspecified diastolic (congestive) heart failure: Secondary | ICD-10-CM

## 2018-02-11 LAB — CBC WITH DIFFERENTIAL/PLATELET
BASOS ABS: 0.1 10*3/uL (ref 0.0–0.1)
BASOS PCT: 1 %
Eosinophils Absolute: 0.3 10*3/uL (ref 0.0–0.7)
Eosinophils Relative: 3 %
HEMATOCRIT: 44.4 % (ref 39.0–52.0)
HEMOGLOBIN: 14.4 g/dL (ref 13.0–17.0)
Lymphocytes Relative: 30 %
Lymphs Abs: 3.1 10*3/uL (ref 0.7–4.0)
MCH: 28.5 pg (ref 26.0–34.0)
MCHC: 32.4 g/dL (ref 30.0–36.0)
MCV: 87.7 fL (ref 78.0–100.0)
MONOS PCT: 7 %
Monocytes Absolute: 0.7 10*3/uL (ref 0.1–1.0)
NEUTROS ABS: 6.3 10*3/uL (ref 1.7–7.7)
NEUTROS PCT: 59 %
Platelets: 206 10*3/uL (ref 150–400)
RBC: 5.06 MIL/uL (ref 4.22–5.81)
RDW: 14.2 % (ref 11.5–15.5)
WBC: 10.4 10*3/uL (ref 4.0–10.5)

## 2018-02-11 LAB — LIPID PANEL
CHOLESTEROL: 120 mg/dL (ref 0–200)
HDL: 28 mg/dL — ABNORMAL LOW (ref >40)
LDL Cholesterol: 76 mg/dL (ref 0–99)
TRIGLYCERIDES: 82 mg/dL (ref <150)
Total CHOL/HDL Ratio: 4.3 RATIO
VLDL: 16 mg/dL (ref 0–40)

## 2018-02-11 LAB — BASIC METABOLIC PANEL
ANION GAP: 13 (ref 5–15)
BUN: 20 mg/dL (ref 6–20)
CHLORIDE: 106 mmol/L (ref 98–111)
CO2: 22 mmol/L (ref 22–32)
Calcium: 9.1 mg/dL (ref 8.9–10.3)
Creatinine, Ser: 1.39 mg/dL — ABNORMAL HIGH (ref 0.61–1.24)
GFR calc non Af Amer: 54 mL/min — ABNORMAL LOW (ref >60)
Glucose, Bld: 127 mg/dL — ABNORMAL HIGH (ref 70–99)
POTASSIUM: 3.3 mmol/L — AB (ref 3.5–5.1)
Sodium: 141 mmol/L (ref 135–145)

## 2018-02-11 LAB — RAPID URINE DRUG SCREEN, HOSP PERFORMED
AMPHETAMINES: NOT DETECTED
BENZODIAZEPINES: NOT DETECTED
Barbiturates: NOT DETECTED
COCAINE: NOT DETECTED
Opiates: POSITIVE — AB
TETRAHYDROCANNABINOL: NOT DETECTED

## 2018-02-11 LAB — HEMOGLOBIN A1C
Hgb A1c MFr Bld: 5.3 % (ref 4.8–5.6)
Mean Plasma Glucose: 105.41 mg/dL

## 2018-02-11 MED ORDER — GADOBUTROL 1 MMOL/ML IV SOLN
10.0000 mL | Freq: Once | INTRAVENOUS | Status: AC | PRN
  Administered 2018-02-11: 10 mL via INTRAVENOUS

## 2018-02-11 NOTE — Progress Notes (Signed)
  Echocardiogram 2D Echocardiogram has been performed.  Robert SkeenVijay  [name] 02/11/2018, 3:32 PM

## 2018-02-11 NOTE — Consult Note (Signed)
[facility]    Date of Admission:  02/10/2018   Total days of antibiotics 0               Reason for Consult: Rule Out Neurosyphilis     Referring Provider: [place]  Primary Care Provider: Loletta SpecterGomez, Roger David, PA-C   Assessment: 62 y.o. male with history of slow ~9975m decline with neurocognitive and memory function in the setting of ongoing falls/weakness R > L side. MRI noted with many micro strokes with areas of micro hemorrhage. Getting LP would be the only definitive way to rule out contribution of syphilis here which he is agreeable to; however this is likely the result of an event he sustained months ago as he has other stroke risk factors present and was not taking medications consistently in the setting of economic hardship and loss of job/home. Would check guided LP and send for cell count/diff, Protein, Glucose and CSF VDRL. 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 I called the state lab and there have not been any reported RPRs on his behalf in [place].   He is HIV, Hep B and Hep C negative.   Plan: 1. LP - send CSF for VDRL, protein/glucose, cell count please.   2. Otherwise care per primary/neuro team 3. Likely rehab candidate as he has some significant right sided impairment   Active Problems:   Acute encephalopathy   CVA (cerebral vascular accident) (HCC)   Acute kidney injury superimposed on chronic kidney disease (HCC)   History of syphilis   Constipation   Osteoarthritis   Benign essential HTN   .  stroke: mapping our early stages of recovery book   Does not apply Once  . aspirin EC  81 mg Oral Daily  . atorvastatin  40 mg Oral Daily  . senna-docusate  1 tablet Oral BID    HPI: [name] is a 62 y.o. male who [facility] on [facility]/11 at the advisement of his primary care provider due to abnormal testing.   He is [facility] for consideration/work up of possible  neurosyphilis. He had blood work done at his PCPs office on 9-09 due to neurocognitive changes and found to have a positive RPR with titer 1:2. Per the OV note his mother and father have noticed a big change in the patient's speech and cognition over the last 6 months now; repeating himself often and less articulate overall. Has also had multiple falls over the last month. He is supposed to be taking ASA, Atorvastatin and Prinzide but there is question as to if he is taking these. He is currently without a home as he did not pay rent 2/2 unemployment and currently sleeping in his car.   He reports that he contracted syphilis at the age of 62 or [age] but had gonorrhea notably 7 - 8 times from his report. He endorses sexual relationships with women only and denies any previous or current history of MSM relationships. He says he was treated twice at a clinic for STIs and also took penicillin pills he obtained on his own 4 or more separate occasions he got from someone off the streets.   MRI of the brain reveals extensive chronic microvascular ischemic changes throughout the white matter, numerous chronic lacunar infarctions in the pons b/l, thalamus b/l and basal ganglia b/l. Multiple areas of chronic micro hemorrhage in the brain including  the left pons, right thalamus, right occipital lobe and left temporal lobe.   He feels that he has felt "off" for about 5 years now but notably 3-4 months ago when he recalls one fall in particular that left him lying on the floor for 1 hour then second fall again shortly after when he was able to get up. Since this time he has had ongoing trouble with memory, speech, word finding, balance and ongoing fall events.   Hep C Ab negative this admission. HIV negative 11/2017. Hep B sAg negative.  Review of Systems  Constitutional: Negative for chills, fever, malaise/fatigue and weight loss.  Eyes: Negative for blurred vision.  Respiratory: Negative for cough, sputum production  and shortness of breath.   Cardiovascular: Negative for chest pain and leg swelling.  Gastrointestinal: Negative for abdominal pain.  Genitourinary: Negative for dysuria.  Musculoskeletal: Positive for falls and joint pain (right knee).  Skin: Negative for rash.  Neurological: Positive for speech change and weakness.       Memory changes     Past Medical History:  Diagnosis Date  . Arthritis   . High cholesterol   . Hypertension     Social History   Tobacco Use  . Smoking status: Former Games developer  . Smokeless tobacco: Never Used  Substance Use Topics  . Alcohol use: Not Currently  . Drug use: Not Currently    Comment: hx marijuana use    No family history on file. No Known Allergies  OBJECTIVE: Blood pressure (!) 158/80, pulse 81, temperature 98 F (36.7 C), temperature source Oral, resp. rate 18, SpO2 100 %.  Physical Exam  Constitutional: He is oriented to person, place, and time. He appears well-developed and well-nourished.  Resting comfortably in recliner. Cold under many blankets.   HENT:  Mouth/Throat: Oropharynx is clear and moist and mucous membranes are normal. Normal dentition. No dental abscesses.  Eyes: Pupils are equal, round, and reactive to light. No scleral icterus.  Neck: Normal range of motion.  Cardiovascular: Normal rate, regular rhythm, normal heart sounds and intact distal pulses.  No murmur heard. Pulmonary/Chest: Effort normal and breath sounds normal. No respiratory distress.  Abdominal: Soft. He exhibits no distension. There is no tenderness.  Musculoskeletal: He exhibits no edema.  Lymphadenopathy:    He has no cervical adenopathy.  Neurological: He is alert and oriented to person, place, and time. A cranial nerve deficit is present. No sensory deficit.  R grip weaker than left. R foot weak with flexion. Unequal shoulder raise with difficulty raising right shoulder and unequal eyebrow raise. No obvious facial droop. Facial and peripheral  sensation equal.  Skin: Skin is warm and dry. No rash noted.  Psychiatric: He has a normal mood and affect. Judgment normal.  In good spirits today and engaged in care discussion.   Nursing note and vitals reviewed.   Lab Results Lab Results  Component Value Date   WBC 10.4 02/11/2018   HGB 14.4 02/11/2018   HCT 44.4 02/11/2018   MCV 87.7 02/11/2018   PLT 206 02/11/2018    Lab Results  Component Value Date   CREATININE 1.39 (H) 02/11/2018   BUN 20 02/11/2018   NA 141 02/11/2018   K 3.3 (L) 02/11/2018   CL 106 02/11/2018   CO2 22 02/11/2018    Lab Results  Component Value Date   ALT 12 02/08/2018   AST 21 02/08/2018   ALKPHOS 102 02/08/2018   BILITOT 1.0 02/08/2018     Microbiology:  No results found for this or any previous visit (from the past 240 hour(s)).  Rexene Alberts, MSN, NP-C [facility] Cell: [phone] Pager: [phone]  02/11/2018 2:33 PM

## 2018-02-11 NOTE — Progress Notes (Signed)
PROGRESS NOTE [facility]   Robert PatienceDarryl Janvrin   ZOX:[id number] DOB: 12/03/1955  DOA: 02/10/2018 PCP: Loletta SpecterGomez, Roger David, PA-C   Brief Narrative:  Robert Donovan is a 62 year old male with medical history significant for hypertension, hyperlipidemia, CVA in the past, obesity and osteoarthritis who presented to the emergency department after being seen by PCP for positive blood test of syphilis during his last evaluation.  Per H&P patient report that he contracted syphilis at the age of 62 or 89 and reported being treated multiple times.  He also reported over the past 4 to 5 months he has progressively declined with slurred speech, right foot dragging and inability to lift up, multiple falls and confusion started time.  Upon ED evaluation vital signs were found to be stable, WBC 10.5 with mild elevated creatinine at 1.66.  CT of the head without contrast revealed focal hypodensity of the left parasagittal frontal lobe in the subcortical white matter which was new when compared to previous images from April.  Patient was admitted with working diagnosis of possible neurosyphilis versus stroke.  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  Neurology was consulted and requested patient to be [facility].  Subjective: Patient seen and examined, he reports feeling okay however right leg weakness and unable to move properly.  Feel his speech is slurred. Upon further questioning patient reports to me that he actually was diagnosed with gonorrhea and has been treated multiple times but never with syphilis.  This contradicts what he told admitting physician last night.   Assessment & Plan: Acute metabolic encephalopathy Felt to be secondary to CVA, also concerning about neurosyphilis. Initial presentation with cognitive decline, slurred speech and right lower extremity weakness/foot drop. MRI positive for acute CVA.  Given uncertainty of patient  being treated for syphilis or not will start patient on IV penicillin.  Will consult ID.  Treat underlying causes  Acute CVA MRI Brain: Multiple areas of acute infarct in the left anterior cerebral artery and left middle cerebral artery territory suggesting emboli.  Multiple areas of chronic microhemorrhage in the brain. NIH score 7.  Neurology has been consulted recommended transfer [facility] for further work-up.  Echocardiogram with bubble study has been ordered to rule out PFO.  Patient on aspirin and Lipitor.  Will check A1c, lipid panel and update PT and OT consult.  Follow neurology recommendations.  New neurochecks and fall precautions.  AKI on CKD stage II Likely prerenal and continuation of diuretics.  Creatinine on admission 1.66 Creatinine has improved after IV hydration.  Continue IV fluids for now.  Avoid nephrotoxic agent and check renal function in a.m.  Positive syphilis results Patient with contradicting stories regarding having syphilis in the past versus gonorrhea. However current lab work-up shows ANA, T palladium ABS and RPR positive with quantitative of 1:2, this also could be contributing to acute metabolic encephalopathy. LP has been ordered, will need to hold Lovenox tonight.  Case discussed with ID and will see in consultation.  Recommending to hold on antibiotic until definitive diagnosis has been established.  Essential hypertension Slight elevated BP, will allow permissive hypertension.  Continue to hold lisinopril and hydrochlorothiazide in setting of AKI. Monitor BP closely  Homeless Social work consulted.  DVT prophylaxis: Lovenox - hold tonight for LP  Code Status: Full Code  Family Communication: None at bedside  Disposition Plan: [facility] and treatment.   Consultants:  ID  Neurology   Procedures:   None   Antimicrobials:  None     Objective: Vitals:   02/10/18 2330 02/11/18 0101 02/11/18  0528 02/11/18 0735  BP: (!) 177/96 (!) 165/82 (!) 146/85 (!) 156/92  Pulse: 62 61 (!) 58 (!) 58  Resp: 20 16 20 18   Temp:  98.7 F (37.1 C) 98.6 F (37 C) 97.7 F (36.5 C)  TempSrc:  Oral Oral Oral  SpO2: 100% 98% 97% 98%    Intake/Output Summary (Last 24 hours) at 02/11/2018 1017 Last data filed at 02/11/2018 0900 Gross per 24 hour  Intake 500 ml  Output 400 ml  Net 100 ml   There were no vitals filed for this visit.  Examination:  General exam: NAD, slurred speech  HEENT: OP clear, poor dentition  Respiratory system: Clear to auscultation. No wheezes,crackle or rhonchi Cardiovascular system: S1 & S2 heard, RRR. No JVD, murmurs, rubs or gallops Gastrointestinal system: Abdomen is nondistended, soft and nontender. Central nervous system: Alert and oriented.R LE decreased sensation, R facial drop Extremities: Trace B/L LE edema, strength right lower extremity 1/5, right upper extremity 4/5 Skin: No rashes Psychiatry: Mood & affect appropriate.    Data Reviewed: I have personally reviewed following labs and imaging studies  CBC: Recent Labs  Lab 02/08/18 1506 02/10/18 1722 02/11/18 0345  WBC 10.5 10.5 10.4  NEUTROABS 6.9 6.6 6.3  HGB 15.3 16.2 14.4  HCT 47.5 48.6 44.4  MCV 88 87.1 87.7  PLT 253 247 206   Basic Metabolic Panel: Recent Labs  Lab 02/08/18 1506 02/10/18 1722 02/11/18 0345  NA 143 141 141  K 4.0 3.5 3.3*  CL 104 106 106  CO2 22 24 22   GLUCOSE 76 108* 127*  BUN 15 22* 20  CREATININE 1.34* 1.66* 1.39*  CALCIUM 9.8 9.9 9.1   GFR: Estimated Creatinine Clearance: 75.3 mL/min (A) (by C-G formula based on SCr of 1.39 mg/dL (H)). Liver Function Tests: Recent Labs  Lab 02/08/18 1506  AST 21  ALT 12  ALKPHOS 102  BILITOT 1.0  PROT 7.2  ALBUMIN 4.4   No results for input(s): LIPASE, AMYLASE in the last 168 hours. No results for input(s): AMMONIA in the last 168 hours. Coagulation Profile: No results for input(s): INR, PROTIME in the last  168 hours. Cardiac Enzymes: No results for input(s): CKTOTAL, CKMB, CKMBINDEX, TROPONINI in the last 168 hours. BNP (last 3 results) No results for input(s): PROBNP in the last 8760 hours. HbA1C: Recent Labs    02/11/18 0345  HGBA1C 5.3   CBG: No results for input(s): GLUCAP in the last 168 hours. Lipid Profile: Recent Labs    02/11/18 0345  CHOL 120  HDL 28*  LDLCALC 76  TRIG 82  CHOLHDL 4.3   Thyroid Function Tests: Recent Labs    02/08/18 1506  TSH 1.620   Anemia Panel: No results for input(s): VITAMINB12, FOLATE, FERRITIN, TIBC, IRON, RETICCTPCT in the last 72 hours. Sepsis Labs: No results for input(s): PROCALCITON, LATICACIDVEN in the last 168 hours.  No results found for this or any previous visit (from the past 240 hour(s)).    Radiology Studies: Ct Head Wo Contrast  Result Date: 02/10/2018 CLINICAL DATA:  Altered mental status EXAM: CT HEAD WITHOUT CONTRAST TECHNIQUE: Contiguous axial images were obtained from the base of the skull through the vertex without intravenous contrast. COMPARISON:  09/24/2017 CT FINDINGS: Brain: No acute territorial infarction, hemorrhage or intracranial mass. More focal hypodensity within the left subcortical  white matter, new since 09/24/2017 comparison CT. Chronic infarcts within the left thalamus and pons. Moderate small vessel ischemic changes of the white matter. Stable ventricle size. Vascular: No hyperdense vessels.  Carotid vascular calcification Skull: Normal. Negative for fracture or focal lesion. Sinuses/Orbits: No acute finding. Other: None IMPRESSION: 1. Focal hypodensity within the left parasagittal frontal lobe subcortical white matter, which may reflect age indeterminate white matter infarct or possible small focus of edema. This is new since 09/24/2017. 2. Negative for acute hemorrhage. 3. Atrophy with moderate small vessel ischemic changes of the white matter 4. Chronic lacunar infarcts in the pons and left thalamus.  Electronically Signed   By: Jasmine Pang M.D.   On: 02/10/2018 18:40   Mr Laqueta Jean And Wo Contrast  Result Date: 02/11/2018 CLINICAL DATA:  Confusion and able to ambulate. Abnormal CT head. Rule out neuro syphilis. EXAM: MRI HEAD WITHOUT AND WITH CONTRAST TECHNIQUE: Multiplanar, multiecho pulse sequences of the brain and surrounding structures were obtained without and with intravenous contrast. CONTRAST:  10 mL Gadovist IV COMPARISON:  CT head 02/10/2018 FINDINGS: Brain: Multiple areas of acute infarction. Largest areas over the left frontal convexity in the anterior cerebral artery territory. Small areas of acute infarct in the left parietal periventricular white matter and left posterior temporal white matter. Extensive chronic microvascular ischemic changes throughout the white matter. Numerous chronic lacunar infarctions in the pons bilaterally, thalamus bilaterally and basal ganglia bilaterally. Multiple areas of chronic microhemorrhage in the brain including the left pons, right thalamus, right occipital lobe, left temporal lobe. Negative for mass lesion. Normal enhancement postcontrast administration. Vascular: Normal arterial flow voids Skull and upper cervical spine: Negative Sinuses/Orbits: Mild mucosal edema paranasal sinuses.  Normal orbit Other: None IMPRESSION: Multiple areas of acute infarct in the left anterior cerebral artery and left middle cerebral artery territory suggesting emboli. Advanced chronic ischemic changes as described above. Multiple areas of chronic microhemorrhage in the brain likely due to poorly controlled hypertension. If there is continued concern of neuro syphilis, lumbar puncture suggested. Electronically Signed   By: Marlan Palau M.D.   On: 02/11/2018 07:57      Scheduled Meds: .  stroke: mapping our early stages of recovery book   Does not apply Once  . aspirin EC  81 mg Oral Daily  . atorvastatin  40 mg Oral Daily  . enoxaparin (LOVENOX) injection  60 mg  Subcutaneous QHS  . senna-docusate  1 tablet Oral BID   Continuous Infusions: . sodium chloride Stopped (02/11/18 0624)     LOS: 1 day    Time spent: Total of 35 minutes spent with pt, greater than 50% of which was spent in discussion of  treatment, counseling and coordination of care   Latrelle Dodrill, MD Pager: Text Page via [email or web address]   If 7PM-7AM, please contact night-coverage [email or web address] 02/11/2018, 10:17 AM   Note - This record has been created using [employer]. Chart creation errors have been sought, but may not always have been located. Such creation errors do not reflect on the standard of medical care.

## 2018-02-11 NOTE — Progress Notes (Signed)
PT Cancellation Note  Patient Details Name: Robert Donovan MRN: [id number] DOB: 09/22/1955   Cancelled Treatment:    Reason Eval/Treat Not Completed: Medical issues which prohibited therapy(neuro work up pending, [facility])   Renu Asby,KATHrine E 02/11/2018, 12:07 PM Zenovia JarredKati Jawuan Robb, PT, DPT Acute Rehabilitation Services Office: [phone] Pager: [phone]

## 2018-02-11 NOTE — Evaluation (Signed)
SLP Cancellation Note  Patient Details Name: Robert PatienceDarryl Dashner MRN: [id number] DOB: 06/02/1955   Cancelled treatment:       Reason Eval/Treat Not Completed: Other (comment)(pt being [facility] work [facility])   Chales AbrahamsKimball, Khloey Chern Ann 02/11/2018, 3:29 PM   Donavan Burnetamara Zoila Ditullio, MS [facility] SLP [phone]

## 2018-02-11 NOTE — Progress Notes (Signed)
OT Cancellation Note  Patient Details Name: Robert Donovan MRN: [id number] DOB: 01/08/1956   Cancelled Treatment:    Reason Eval/Treat Not Completed: Other (comment)  [facility].   Robert Donovan 02/11/2018, 2:18 PM  Robert Donovan, OTR/L Acute Rehabilitation Services [phone] pager [phone] office 02/11/2018

## 2018-02-11 NOTE — Progress Notes (Signed)
Lovenox per Pharmacy for DVT Prophylaxis    Pharmacy has been consulted from dosing enoxaparin (lovenox) in this patient for DVT prophylaxis.  The pharmacist has reviewed pertinent labs (Hgb _14.4__; PLT_206__), patient weight (_129__kg) and renal function (CrCl__75_mL/min) and decided that enoxaparin 60__mg SQ Q24Hrs is appropriate for this patient.  The pharmacy department will sign off at this time.  Please reconsult pharmacy if status changes or for further issues.  Thank you  Luetta NuttingJulian Crowford Jeffrie Stander, Jr PharmD, BCPS  02/11/2018, 6:56 AM

## 2018-02-12 ENCOUNTER — Telehealth: Payer: Self-pay | Admitting: Licensed Clinical Social Worker

## 2018-02-12 ENCOUNTER — Inpatient Hospital Stay (HOSPITAL_COMMUNITY): Payer: Medicaid Other

## 2018-02-12 DIAGNOSIS — A530 Latent syphilis, unspecified as early or late: Secondary | ICD-10-CM

## 2018-02-12 DIAGNOSIS — Z91120 Patient's intentional underdosing of medication regimen due to financial hardship: Secondary | ICD-10-CM

## 2018-02-12 DIAGNOSIS — M25561 Pain in right knee: Secondary | ICD-10-CM

## 2018-02-12 DIAGNOSIS — Z9181 History of falling: Secondary | ICD-10-CM

## 2018-02-12 DIAGNOSIS — Z8673 Personal history of transient ischemic attack (TIA), and cerebral infarction without residual deficits: Secondary | ICD-10-CM

## 2018-02-12 DIAGNOSIS — R4189 Other symptoms and signs involving cognitive functions and awareness: Secondary | ICD-10-CM

## 2018-02-12 DIAGNOSIS — R768 Other specified abnormal immunological findings in serum: Secondary | ICD-10-CM

## 2018-02-12 DIAGNOSIS — Z87438 Personal history of other diseases of male genital organs: Secondary | ICD-10-CM

## 2018-02-12 DIAGNOSIS — Z87891 Personal history of nicotine dependence: Secondary | ICD-10-CM

## 2018-02-12 DIAGNOSIS — Z590 Homelessness: Secondary | ICD-10-CM

## 2018-02-12 LAB — CSF CELL COUNT WITH DIFFERENTIAL
RBC Count, CSF: 1 /mm3 — ABNORMAL HIGH
RBC Count, CSF: 235 /mm3 — ABNORMAL HIGH
TUBE #: 1
Tube #: 4
WBC CSF: 1 /mm3 (ref 0–5)
WBC, CSF: 2 /mm3 (ref 0–5)

## 2018-02-12 LAB — PROTEIN AND GLUCOSE, CSF
Glucose, CSF: 74 mg/dL — ABNORMAL HIGH (ref 40–70)
Total  Protein, CSF: 80 mg/dL — ABNORMAL HIGH (ref 15–45)

## 2018-02-12 MED ORDER — LIDOCAINE HCL (PF) 1 % IJ SOLN
INTRAMUSCULAR | Status: AC
  Administered 2018-02-12: 5 mL
  Filled 2018-02-12: qty 10

## 2018-02-12 MED ORDER — LIDOCAINE HCL (PF) 1 % IJ SOLN
5.0000 mL | Freq: Once | INTRAMUSCULAR | Status: AC
  Administered 2018-02-12: 5 mL

## 2018-02-12 NOTE — Progress Notes (Signed)
Rehab Admissions Coordinator Note:  Patient was screened by Clois DupesBoyette, Tyreanna Bisesi Godwin for appropriateness for an Inpatient Acute Rehab Consult per OT and SLP recommendations. Noted from chart pta in Epic that Mom reports pt is homeless and is working with housing to find assistance for him as he has had gradual decline over past few months. Inpatient rehab admission is short term and it appears pt needs long term assistance. At this time, we are recommending Skilled Nursing Facility. Please call me with any questions.  Ottie GlazierBarbara Tanyika Barros, RN, MSN Rehab Admissions Coordinator [phone] 02/12/2018 1:47 PM

## 2018-02-12 NOTE — Progress Notes (Signed)
PROGRESS NOTE [facility]   Rise PatienceDarryl Pancake   ZOX:[id number] DOB: 03/21/1956  DOA: 02/10/2018 PCP: Loletta SpecterGomez, Roger David, PA-C   Brief Narrative:  Patient is a 62 year old male with medical history significant for hypertension, hyperlipidemia, CVA in the past, obesity and osteoarthritis who presented to the emergency department after being seen by PCP for positive blood test of syphilis during his last evaluation.  Per H&P patient report that he contracted syphilis at the age of 62 or [age] and reported being treated multiple times.  He also reported over the past 4 to 5 months he has progressively declined with slurred speech, right foot dragging and inability to lift up, multiple falls and confusion started time.  Upon ED evaluation, CT of the head without contrast revealed focal hypodensity of the left parasagittal frontal lobe in the subcortical white matter which was new when compared to previous images from April.  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  Neurology input is highly appreciated.  Subjective: Patient seen. Reports feeling better, but could not elaborate further.  Assessment & Plan: Acute metabolic encephalopathy Work-up has revealed acute CVA.   History of syphilis noted.    Acute CVA MRI Brain: Multiple areas of acute infarct in the left anterior cerebral artery and left middle cerebral artery territory suggesting emboli.  Multiple areas of chronic microhemorrhage in the brain. NIH score 7.  Neurology has been consulted recommended transfer [facility] work-up.  Echocardiogram with bubble study has been ordered to rule out PFO.  Patient on aspirin and Lipitor.  Will check A1c, lipid panel and update PT and OT consult.  Follow neurology recommendations.  New neurochecks and fall precautions. 02/12/2018: Patient is currently on aspirin and Lipitor.  Neurology is directing care.  AKI on CKD stage II Likely prerenal and  continuation of diuretics.  Creatinine on admission 1.66 Creatinine has improved after IV hydration.  Continue IV fluids for now.  Avoid nephrotoxic agent and check renal function in a.m. 02/12/2018: On 02/11/2018, serum creatinine was down to 1.39.  Repeat BMP in the morning.  Positive syphilis results Patient with contradicting stories regarding having syphilis in the past versus gonorrhea. However current lab work-up shows ANA, T palladium ABS and RPR positive with quantitative of 1:2, this also could be contributing to acute metabolic encephalopathy. LP has been ordered, will need to hold Lovenox tonight.  Case discussed with ID and will see in consultation.  Recommending to hold on antibiotic until definitive diagnosis has been established. 02/12/2018: Infectious disease input is appreciated.  Follow CSF analysis.  Hep A vaccine prior to discharge.  Essential hypertension Slight elevated BP, will allow permissive hypertension.  Continue to hold lisinopril and hydrochlorothiazide in setting of AKI. Monitor BP closely 02/12/2018: BP is improving slowly.    Homeless Social work consulted.  DVT prophylaxis: Lovenox - hold tonight for LP  Code Status: Full Code  Family Communication: None at bedside  Disposition Plan: Transfer to [facility] hospital for further evaluation and treatment.   Consultants:   ID  Neurology   Procedures:   None   Antimicrobials:  None     Objective: Vitals:   02/11/18 2343 02/12/18 0438 02/12/18 0933 02/12/18 1231  BP: (!) 174/93 (!) 143/65 (!) 175/92 (!) 171/83  Pulse: 78 (!) 57 61 78  Resp: 18 18 17 20   Temp: 98.7 F (37.1 C) 98.6 F (37 C) 97.9 F (36.6 C) 98.6 F (37 C)  TempSrc: Oral Oral  Oral Oral  SpO2: 95% 98% 100% 100%  Weight:      Height:        Intake/Output Summary (Last 24 hours) at 02/12/2018 1415 Last data filed at 02/12/2018 1300 Gross per 24 hour  Intake 3136.42 ml  Output -  Net 3136.42 ml   Filed Weights   02/11/18 2033    Weight: 132.8 kg    Examination:  General exam: NAD, slurred speech  HEENT: OP clear, poor dentition  Respiratory system: Clear to auscultation. No wheezes,crackle or rhonchi Cardiovascular system: S1 & S2 heard. Gastrointestinal system: Abdomen is obese, soft and nontender.  Organs are difficult to assess.  Central nervous system: Alert and oriented.R LE decreased sensation, R facial drop Extremities: Trace B/L LE edema, strength right lower extremity 1/5, right upper extremity 4/5 Skin: No rashes Psychiatry: Mood & affect appropriate.    Data Reviewed: I have personally reviewed following labs and imaging studies  CBC: Recent Labs  Lab 02/08/18 1506 02/10/18 1722 02/11/18 0345  WBC 10.5 10.5 10.4  NEUTROABS 6.9 6.6 6.3  HGB 15.3 16.2 14.4  HCT 47.5 48.6 44.4  MCV 88 87.1 87.7  PLT 253 247 206   Basic Metabolic Panel: Recent Labs  Lab 02/08/18 1506 02/10/18 1722 02/11/18 0345  NA 143 141 141  K 4.0 3.5 3.3*  CL 104 106 106  CO2 22 24 22   GLUCOSE 76 108* 127*  BUN 15 22* 20  CREATININE 1.34* 1.66* 1.39*  CALCIUM 9.8 9.9 9.1   GFR: Estimated Creatinine Clearance: 76.3 mL/min (A) (by C-G formula based on SCr of 1.39 mg/dL (H)). Liver Function Tests: Recent Labs  Lab 02/08/18 1506  AST 21  ALT 12  ALKPHOS 102  BILITOT 1.0  PROT 7.2  ALBUMIN 4.4   No results for input(s): LIPASE, AMYLASE in the last 168 hours. No results for input(s): AMMONIA in the last 168 hours. Coagulation Profile: No results for input(s): INR, PROTIME in the last 168 hours. Cardiac Enzymes: No results for input(s): CKTOTAL, CKMB, CKMBINDEX, TROPONINI in the last 168 hours. BNP (last 3 results) No results for input(s): PROBNP in the last 8760 hours. HbA1C: Recent Labs    02/11/18 0345  HGBA1C 5.3   CBG: No results for input(s): GLUCAP in the last 168 hours. Lipid Profile: Recent Labs    02/11/18 0345  CHOL 120  HDL 28*  LDLCALC 76  TRIG 82  CHOLHDL 4.3   Thyroid  Function Tests: No results for input(s): TSH, T4TOTAL, FREET4, T3FREE, THYROIDAB in the last 72 hours. Anemia Panel: No results for input(s): VITAMINB12, FOLATE, FERRITIN, TIBC, IRON, RETICCTPCT in the last 72 hours. Sepsis Labs: No results for input(s): PROCALCITON, LATICACIDVEN in the last 168 hours.  No results found for this or any previous visit (from the past 240 hour(s)).    Radiology Studies: Ct Head Wo Contrast  Result Date: 02/10/2018 CLINICAL DATA:  Altered mental status EXAM: CT HEAD WITHOUT CONTRAST TECHNIQUE: Contiguous axial images were obtained from the base of the skull through the vertex without intravenous contrast. COMPARISON:  09/24/2017 CT FINDINGS: Brain: No acute territorial infarction, hemorrhage or intracranial mass. More focal hypodensity within the left subcortical white matter, new since 09/24/2017 comparison CT. Chronic infarcts within the left thalamus and pons. Moderate small vessel ischemic changes of the white matter. Stable ventricle size. Vascular: No hyperdense vessels.  Carotid vascular calcification Skull: Normal. Negative for fracture or focal lesion. Sinuses/Orbits: No acute finding. Other: None IMPRESSION: 1. Focal hypodensity within the  left parasagittal frontal lobe subcortical white matter, which may reflect age indeterminate white matter infarct or possible small focus of edema. This is new since 09/24/2017. 2. Negative for acute hemorrhage. 3. Atrophy with moderate small vessel ischemic changes of the white matter 4. Chronic lacunar infarcts in the pons and left thalamus. Electronically Signed   By: Jasmine Pang M.D.   On: 02/10/2018 18:40   Mr Laqueta Jean And Wo Contrast  Result Date: 02/11/2018 CLINICAL DATA:  Confusion and able to ambulate. Abnormal CT head. Rule out neuro syphilis. EXAM: MRI HEAD WITHOUT AND WITH CONTRAST TECHNIQUE: Multiplanar, multiecho pulse sequences of the brain and surrounding structures were obtained without and with intravenous  contrast. CONTRAST:  10 mL Gadovist IV COMPARISON:  CT head 02/10/2018 FINDINGS: Brain: Multiple areas of acute infarction. Largest areas over the left frontal convexity in the anterior cerebral artery territory. Small areas of acute infarct in the left parietal periventricular white matter and left posterior temporal white matter. Extensive chronic microvascular ischemic changes throughout the white matter. Numerous chronic lacunar infarctions in the pons bilaterally, thalamus bilaterally and basal ganglia bilaterally. Multiple areas of chronic microhemorrhage in the brain including the left pons, right thalamus, right occipital lobe, left temporal lobe. Negative for mass lesion. Normal enhancement postcontrast administration. Vascular: Normal arterial flow voids Skull and upper cervical spine: Negative Sinuses/Orbits: Mild mucosal edema paranasal sinuses.  Normal orbit Other: None IMPRESSION: Multiple areas of acute infarct in the left anterior cerebral artery and left middle cerebral artery territory suggesting emboli. Advanced chronic ischemic changes as described above. Multiple areas of chronic microhemorrhage in the brain likely due to poorly controlled hypertension. If there is continued concern of neuro syphilis, lumbar puncture suggested. Electronically Signed   By: Marlan Palau M.D.   On: 02/11/2018 07:57      Scheduled Meds: .  stroke: mapping our early stages of recovery book   Does not apply Once  . aspirin EC  81 mg Oral Daily  . atorvastatin  40 mg Oral Daily  . senna-docusate  1 tablet Oral BID   Continuous Infusions: . sodium chloride 125 mL/hr at 02/11/18 2253     LOS: 2 days    Time spent: 35 minutes.     Barnetta Chapel, MD Pager: Text Page via [email or web address]   If 7PM-7AM, please contact night-coverage [email or web address] 02/12/2018, 2:15 PM

## 2018-02-12 NOTE — Consult Note (Signed)
Referring Physician: Dr. Marthenia Rolling    Chief Complaint: Right sided weakness  HPI: Robert Donovan is an 62 y.o. male with a PMHx of HTN, HLD and CVA who presented to [facility] after a positive blood test for syphilis. He was treated for this as a teenager. Over the past 4-5 months he has had progressive decline with worsened slurred speech, dragging right foot with several falls. His labs have included a positive ANA, positive T. Pallidum Abs and positive RPR. MRI brain showed multiple foci of restricted diffusion in the left ACA and MCA territories that were non-enhancing and appeared most consistent with embolic phenomena.   MRI brain:  Multiple areas of acute infarct in the left anterior cerebral artery and left middle cerebral artery territory suggesting emboli. Advanced chronic ischemic changes. Multiple areas of chronic microhemorrhage in the brain likely due to poorly controlled hypertension.   Past Medical History:  Diagnosis Date  . Arthritis   . High cholesterol   . Hypertension     History reviewed. No pertinent surgical history.  No family history on file. Social History:  reports that he has quit smoking. He has never used smokeless tobacco. He reports that he drank alcohol. He reports that he has current or past drug history.  Allergies: No Known Allergies  Medications:  Prior to Admission:  Medications Prior to Admission  Medication Sig Dispense Refill Last Dose  . aspirin EC 81 MG tablet Take 1 tablet (81 mg total) by mouth daily. 90 tablet 3 02/10/2018 at Unknown time  . atorvastatin (LIPITOR) 40 MG tablet Take 1 tablet (40 mg total) by mouth daily. 30 tablet 11 02/10/2018 at Unknown time  . lisinopril-hydrochlorothiazide (PRINZIDE,ZESTORETIC) 20-25 MG tablet Take 1 tablet by mouth daily. 30 tablet 11 02/10/2018 at Unknown time  . acetaminophen-codeine (TYLENOL #3) 300-30 MG tablet Take 1 tablet by mouth every 8 (eight) hours as needed for moderate pain. 42 tablet 0 havent  started yet   Scheduled: .  stroke: mapping our early stages of recovery book   Does not apply Once  . aspirin EC  81 mg Oral Daily  . atorvastatin  40 mg Oral Daily  . senna-docusate  1 tablet Oral BID   Continuous: . sodium chloride 125 mL/hr at 02/11/18 2253    ROS: Does not endorse headache, confusion or CP. Detailed ROS cannot be obtained due to cognitive/communication deficit.   Physical Examination: Blood pressure (!) 143/65, pulse (!) 57, temperature 98.6 F (37 C), temperature source Oral, resp. rate 18, height '5\' 9"'  (1.753 m), weight 132.8 kg, SpO2 98 %.  HEENT: [place]/AT Lungs: Respirations unlabored Ext: No edema  Neurologic Examination: Mental Status:  Alert, oriented to day, month, year, city and state. Speech fluent with intact comprehension for basic questions and commands. Naming intact. Mild bradyphrenia noted.  Cranial Nerves: II:  Visual fields grossly normal with no extinction to DSS. PERRL.  III,IV, VI: No ptosis. EOMI without nystagmus.  V,VII: No facial droop. Temp sensation equal bilaterally.  VIII: Hearing intact to voice IX,X: No hypophonia XI: No asymmerty XII: midline tongue extension  Motor: RUE: 4/5 proximal and distal RLE: 2/5 hip flexion, 3/5 knee extension LUE and LLE 5/5 Sensory: Temp and FT sensation equal bilateral upper and lower ext Deep Tendon Reflexes:  3+ bilateral upper ext and patellae.  Plantars: Equivocal bilaterally Cerebellar: No ataxia with FNF bilaterally  Gait: Deferred  Results for orders placed or performed during the hospital encounter of 02/10/18 (from the past 48 hour(s))  Basic metabolic panel     Status: Abnormal   Collection Time: 02/10/18  5:22 PM  Result Value Ref Range   Sodium 141 135 - 145 mmol/L   Potassium 3.5 3.5 - 5.1 mmol/L   Chloride 106 98 - 111 mmol/L   CO2 24 22 - 32 mmol/L   Glucose, Bld 108 (H) 70 - 99 mg/dL   BUN 22 (H) 6 - 20 mg/dL   Creatinine, Ser 1.66 (H) 0.61 - 1.24 mg/dL   Calcium 9.9  8.9 - 10.3 mg/dL   GFR calc non Af Amer 43 (L) >60 mL/min   GFR calc Af Amer 50 (L) >60 mL/min    Comment: (NOTE) The eGFR has been calculated using the CKD EPI equation. This calculation has not been validated in all clinical situations. eGFR's persistently <60 mL/min signify possible Chronic Kidney Disease.    Anion gap 11 5 - 15    Comment: Performed at [facility], [place] [address], [place], [place] [address]  CBC with Differential     Status: None   Collection Time: 02/10/18  5:22 PM  Result Value Ref Range   WBC 10.5 4.0 - 10.5 K/uL   RBC 5.58 4.22 - 5.81 MIL/uL   Hemoglobin 16.2 13.0 - 17.0 g/dL   HCT 48.6 39.0 - 52.0 %   MCV 87.1 78.0 - 100.0 fL   MCH 29.0 26.0 - 34.0 pg   MCHC 33.3 30.0 - 36.0 g/dL   RDW 14.2 11.5 - 15.5 %   Platelets 247 150 - 400 K/uL   Neutrophils Relative % 63 %   Neutro Abs 6.6 1.7 - 7.7 K/uL   Lymphocytes Relative 28 %   Lymphs Abs 2.9 0.7 - 4.0 K/uL   Monocytes Relative 7 %   Monocytes Absolute 0.7 0.1 - 1.0 K/uL   Eosinophils Relative 2 %   Eosinophils Absolute 0.2 0.0 - 0.7 K/uL   Basophils Relative 0 %   Basophils Absolute 0.0 0.0 - 0.1 K/uL    Comment: Performed at [facility], [place] [address], [place], [place] [address]  Urinalysis, Routine w reflex microscopic     Status: Abnormal   Collection Time: 02/10/18  5:22 PM  Result Value Ref Range   Color, Urine YELLOW YELLOW   APPearance CLEAR CLEAR   Specific Gravity, Urine 1.025 1.005 - 1.030   pH 5.0 5.0 - 8.0   Glucose, UA NEGATIVE NEGATIVE mg/dL   Hgb urine dipstick NEGATIVE NEGATIVE   Bilirubin Urine NEGATIVE NEGATIVE   Ketones, ur 5 (A) NEGATIVE mg/dL   Protein, ur NEGATIVE NEGATIVE mg/dL   Nitrite NEGATIVE NEGATIVE   Leukocytes, UA NEGATIVE NEGATIVE    Comment: Performed at [facility] [address] [place], [place] [address]  Urine rapid drug screen (hosp performed)not at [facility]     Status: Abnormal   Collection Time:  02/10/18  5:22 PM  Result Value Ref Range   Opiates POSITIVE (A) NONE DETECTED   Cocaine NONE DETECTED NONE DETECTED   Benzodiazepines NONE DETECTED NONE DETECTED   Amphetamines NONE DETECTED NONE DETECTED   Tetrahydrocannabinol NONE DETECTED NONE DETECTED   Barbiturates NONE DETECTED NONE DETECTED    Comment: (NOTE) DRUG SCREEN FOR MEDICAL PURPOSES ONLY.  IF CONFIRMATION IS NEEDED FOR ANY PURPOSE, NOTIFY LAB WITHIN 5 DAYS. LOWEST DETECTABLE LIMITS FOR URINE DRUG SCREEN Drug Class  Cutoff (ng/mL) Amphetamine and metabolites    1000 Barbiturate and metabolites    200 Benzodiazepine                 419 Tricyclics and metabolites     300 Opiates and metabolites        300 Cocaine and metabolites        300 THC                            50 Performed at [facility], [place] [address] [place], [place] [address]   Hemoglobin A1c     Status: None   Collection Time: 02/11/18  3:45 AM  Result Value Ref Range   Hgb A1c MFr Bld 5.3 4.8 - 5.6 %    Comment: (NOTE) Pre diabetes:          5.7%-6.4% Diabetes:              >6.4% Glycemic control for   <7.0% adults with diabetes    Mean Plasma Glucose 105.41 mg/dL    Comment: Performed at [facility] [address], [place], [place] [address]  Lipid panel     Status: Abnormal   Collection Time: 02/11/18  3:45 AM  Result Value Ref Range   Cholesterol 120 0 - 200 mg/dL   Triglycerides 82 <150 mg/dL   HDL 28 (L) >40 mg/dL   Total CHOL/HDL Ratio 4.3 RATIO   VLDL 16 0 - 40 mg/dL   LDL Cholesterol 76 0 - 99 mg/dL    Comment:        Total Cholesterol/HDL:CHD Risk Coronary Heart Disease Risk Table                     Men   Women  1/2 Average Risk   3.4   3.3  Average Risk       5.0   4.4  2 X Average Risk   9.6   7.1  3 X Average Risk  23.4   11.0        Use the calculated Patient Ratio above and the CHD Risk Table to determine the patient's CHD Risk.        ATP III CLASSIFICATION  (LDL):  <100     mg/dL   Optimal  100-129  mg/dL   Near or Above                    Optimal  130-159  mg/dL   Borderline  160-189  mg/dL   High  >190     mg/dL   Very High Performed at [facility] [address], [place], [place] [address]   CBC with Differential/Platelet     Status: None   Collection Time: 02/11/18  3:45 AM  Result Value Ref Range   WBC 10.4 4.0 - 10.5 K/uL   RBC 5.06 4.22 - 5.81 MIL/uL   Hemoglobin 14.4 13.0 - 17.0 g/dL   HCT 44.4 39.0 - 52.0 %   MCV 87.7 78.0 - 100.0 fL   MCH 28.5 26.0 - 34.0 pg   MCHC 32.4 30.0 - 36.0 g/dL   RDW 14.2 11.5 - 15.5 %   Platelets 206 150 - 400 K/uL   Neutrophils Relative % 59 %   Neutro Abs 6.3 1.7 - 7.7 K/uL   Lymphocytes Relative 30 %   Lymphs Abs 3.1 0.7 - 4.0 K/uL  Monocytes Relative 7 %   Monocytes Absolute 0.7 0.1 - 1.0 K/uL   Eosinophils Relative 3 %   Eosinophils Absolute 0.3 0.0 - 0.7 K/uL   Basophils Relative 1 %   Basophils Absolute 0.1 0.0 - 0.1 K/uL    Comment: Performed at [facility], [place] [address], [place], [place] [address]  Basic metabolic panel     Status: Abnormal   Collection Time: 02/11/18  3:45 AM  Result Value Ref Range   Sodium 141 135 - 145 mmol/L   Potassium 3.3 (L) 3.5 - 5.1 mmol/L   Chloride 106 98 - 111 mmol/L   CO2 22 22 - 32 mmol/L   Glucose, Bld 127 (H) 70 - 99 mg/dL   BUN 20 6 - 20 mg/dL   Creatinine, Ser 1.39 (H) 0.61 - 1.24 mg/dL   Calcium 9.1 8.9 - 10.3 mg/dL   GFR calc non Af Amer 54 (L) >60 mL/min   GFR calc Af Amer >60 >60 mL/min    Comment: (NOTE) The eGFR has been calculated using the CKD EPI equation. This calculation has not been validated in all clinical situations. eGFR's persistently <60 mL/min signify possible Chronic Kidney Disease.    Anion gap 13 5 - 15    Comment: Performed at [facility], [place] [address] [place], [place] [address]   Ct Head Wo Contrast  Result Date: 02/10/2018 CLINICAL DATA:   Altered mental status EXAM: CT HEAD WITHOUT CONTRAST TECHNIQUE: Contiguous axial images were obtained from the base of the skull through the vertex without intravenous contrast. COMPARISON:  09/24/2017 CT FINDINGS: Brain: No acute territorial infarction, hemorrhage or intracranial mass. More focal hypodensity within the left subcortical white matter, new since 09/24/2017 comparison CT. Chronic infarcts within the left thalamus and pons. Moderate small vessel ischemic changes of the white matter. Stable ventricle size. Vascular: No hyperdense vessels.  Carotid vascular calcification Skull: Normal. Negative for fracture or focal lesion. Sinuses/Orbits: No acute finding. Other: None IMPRESSION: 1. Focal hypodensity within the left parasagittal frontal lobe subcortical white matter, which may reflect age indeterminate white matter infarct or possible small focus of edema. This is new since 09/24/2017. 2. Negative for acute hemorrhage. 3. Atrophy with moderate small vessel ischemic changes of the white matter 4. Chronic lacunar infarcts in the pons and left thalamus. Electronically Signed   By: Donavan Foil M.D.   On: 02/10/2018 18:40   Mr Jeri Cos And Wo Contrast  Result Date: 02/11/2018 CLINICAL DATA:  Confusion and able to ambulate. Abnormal CT head. Rule out neuro syphilis. EXAM: MRI HEAD WITHOUT AND WITH CONTRAST TECHNIQUE: Multiplanar, multiecho pulse sequences of the brain and surrounding structures were obtained without and with intravenous contrast. CONTRAST:  10 mL Gadovist IV COMPARISON:  CT head 02/10/2018 FINDINGS: Brain: Multiple areas of acute infarction. Largest areas over the left frontal convexity in the anterior cerebral artery territory. Small areas of acute infarct in the left parietal periventricular white matter and left posterior temporal white matter. Extensive chronic microvascular ischemic changes throughout the white matter. Numerous chronic lacunar infarctions in the pons bilaterally,  thalamus bilaterally and basal ganglia bilaterally. Multiple areas of chronic microhemorrhage in the brain including the left pons, right thalamus, right occipital lobe, left temporal lobe. Negative for mass lesion. Normal enhancement postcontrast administration. Vascular: Normal arterial flow voids Skull and upper cervical spine: Negative Sinuses/Orbits: Mild mucosal edema paranasal sinuses.  Normal orbit Other: None IMPRESSION: Multiple areas of acute infarct in the left anterior  cerebral artery and left middle cerebral artery territory suggesting emboli. Advanced chronic ischemic changes as described above. Multiple areas of chronic microhemorrhage in the brain likely due to poorly controlled hypertension. If there is continued concern of neuro syphilis, lumbar puncture suggested. Electronically Signed   By: Franchot Gallo M.D.   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 Exam findings correlate with MRI findings of acute strokes in the left ACA and MCA territories. Most likely etiology is cardioembolic stroke.  2. Also on the DDx would be strokes secondary to neurovascular involement by syphilis. He has a history of syphilis which and there is the possibility that he may have been incompletely treated if he had not been compliant. Of note, about 10% of patients with neurosyphilis and almost 3% of all syphilis patients present with stroke. 3. Also seen on MRI are advanced chronic ischemic changes and multiple areas of chronic microhemorrhage in the brain likely due to poorly controlled hypertension. 4. Stroke Risk Factors - Hypercholesterolemia and HTN  Recommendations: 1. HgbA1c, fasting lipid panel 2. MRA of the brain without contrast 3. PT consult, OT consult, Speech consult 4. TTE. If negative obtain TEE.  5. Carotid dopplers 6. Continue ASA and atorvastatin 7. May need a loop recorder 8. Telemetry monitoring 9. Frequent  neuro checks 10. Would obtain ID consult to review the patient's recent syphilis labs to determine if re-treatment with IV penicillin G is indicated.    '@Electronically'  signed: Dr. Kerney Elbe  02/12/2018, 8:55 AM

## 2018-02-12 NOTE — Progress Notes (Signed)
Nutrition Brief Note  Patient identified on the Malnutrition Screening Tool (MST) Report.  Wt Readings from Last 15 Encounters:  02/11/18 132.8 kg  02/08/18 129.4 kg  01/21/18 126.6 kg  01/07/18 133.1 kg  12/07/17 (!) 136.5 kg  12/07/17 (!) 136.5 kg  09/24/17 (!) 145 kg   Body mass index is 43.23 kg/m. Patient meets criteria for Obesity Class III based on current BMI.   Current diet order is Heart Healthy, patient is consuming approximately 100% of meals at this time.   Labs and medications reviewed. No nutrition interventions warranted at this time.   If nutrition issues arise, please consult RD.   Maureen ChattersKatie Adryan Shin, RD, LDN Pager #: [phone] After-Hours Pager #: [phone]

## 2018-02-12 NOTE — Evaluation (Signed)
Physical Therapy Evaluation Patient Details Name: Robert Donovan MRN: [id number] DOB: 11/02/1955 Today's Date: 02/12/2018   History of Present Illness  Jaivon Gwendolyn GrantWalden is a 62 y.o. male with medical history significant of HTN, HLD, CVA, obesity, and osteoarthritis. Reports progressive decline of the last 4-5 months, including slurred speech and R sided weakness with 2 recent falls.  MRI noted with many micro strokes L ACA and L MCA with areas of micro hemorrhage. Plan for LP to rule out conribution of syphilis. PMH signficant for: arthritis, HTN.     Clinical Impression  Pt admitted with above diagnosis. Pt currently with functional limitations due to the deficits listed below (see PT Problem List). On eval, pt required heavy max assist sit to stand and min assist ambulation with RW 10 feet. He present with strength deficits R side as well as balance deficits. Pt will benefit from skilled PT to increase their independence and safety with mobility to allow discharge to the venue listed below.       Follow Up Recommendations CIR    Equipment Recommendations  Rolling walker with 5" wheels    Recommendations for Other Services       Precautions / Restrictions Precautions Precautions: Fall Restrictions Weight Bearing Restrictions: No      Mobility  Bed Mobility               General bed mobility comments: in recliner on arrival  Transfers Overall transfer level: Needs assistance Equipment used: Rolling walker (2 wheeled) Transfers: Sit to/from Stand Sit to Stand: Max assist         General transfer comment: cues for hand placement and sequencing, heavy max assist with pt using momentum to stand from recliner, increased time/assist to stabilize initial standing balance  Ambulation/Gait Ambulation/Gait assistance: Min assist Gait Distance (Feet): 10 Feet Assistive device: Rolling walker (2 wheeled) Gait Pattern/deviations: Trunk flexed;Step-through pattern;Wide base of  support;Decreased stride length Gait velocity: decreased Gait velocity interpretation: <1.31 ft/sec, indicative of household ambulator General Gait Details: heavy reliance on RW with increased trunk flexion, R knee held in extension during swing phase with pt demonstrating wide BOS to accommodate.   Stairs            Wheelchair Mobility    Modified Rankin (Stroke Patients Only) Modified Rankin (Stroke Patients Only) Pre-Morbid Rankin Score: No symptoms Modified Rankin: Moderately severe disability     Balance Overall balance assessment: Needs assistance Sitting-balance support: No upper extremity supported;Feet supported Sitting balance-Leahy Scale: Fair     Standing balance support: Bilateral upper extremity supported;During functional activity Standing balance-Leahy Scale: Poor Standing balance comment: heavy reliance on RW                             Pertinent Vitals/Pain Pain Assessment: Faces Pain Score: 2  Faces Pain Scale: Hurts a little bit Pain Location: back, R knee Pain Descriptors / Indicators: Sore Pain Intervention(s): Monitored during session    Home Living Family/patient expects to be discharged to:: Unsure   Available Help at Discharge: Family;Friend(s);Available PRN/intermittently Type of Home: Homeless           Additional Comments: reports unsure of discharge plan; possibity to stay with friend "[name]" and reports his mother is trying to help    Prior Function Level of Independence: Independent         Comments: prior to recent falls and weakness, patient reports independent with ADls and mobility (reports living in  his truck)     Higher education careers adviser   Dominant Hand: Left    Extremity/Trunk Assessment   Upper Extremity Assessment Upper Extremity Assessment: Defer to OT evaluation RUE Deficits / Details: grossly 3+/5, weakness and coordination limit functional use  RUE Sensation: decreased light touch RUE Coordination:  decreased fine motor;decreased gross motor    Lower Extremity Assessment Lower Extremity Assessment: RLE deficits/detail RLE Deficits / Details: 2/5 hip, 3/5 knee       Communication   Communication: No difficulties  Cognition Arousal/Alertness: Awake/alert Behavior During Therapy: WFL for tasks assessed/performed Overall Cognitive Status: No family/caregiver present to determine baseline cognitive functioning                                 General Comments: answers questions appropriately, oriented      General Comments      Exercises     Assessment/Plan    PT Assessment Patient needs continued PT services  PT Problem List Decreased strength;Decreased mobility;Decreased safety awareness;Decreased coordination;Decreased activity tolerance;Decreased balance;Decreased knowledge of use of DME;Pain       PT Treatment Interventions DME instruction;Therapeutic activities;Gait training;Therapeutic exercise;Patient/family education;Balance training;Functional mobility training;Neuromuscular re-education    PT Goals (Current goals can be found in the Care Plan section)  Acute Rehab PT Goals Patient Stated Goal: to get stronger PT Goal Formulation: With patient Time For Goal Achievement: 02/26/18 Potential to Achieve Goals: Good    Frequency Min 4X/week   Barriers to discharge Decreased caregiver support      Co-evaluation               AM-PAC PT "6 Clicks" Daily Activity  Outcome Measure Difficulty turning over in bed (including adjusting bedclothes, sheets and blankets)?: A Lot Difficulty moving from lying on back to sitting on the side of the bed? : Unable Difficulty sitting down on and standing up from a chair with arms (e.g., wheelchair, bedside commode, etc,.)?: Unable Help needed moving to and from a bed to chair (including a wheelchair)?: A Lot Help needed walking in hospital room?: A Little Help needed climbing 3-5 steps with a railing? :  Total 6 Click Score: 10    End of Session Equipment Utilized During Treatment: Gait belt Activity Tolerance: Patient tolerated treatment well Patient left: Other (comment);with call bell/phone within reach(in bathroom) Nurse Communication: Mobility status;Other (comment)(Pt in bathroom.) PT Visit Diagnosis: Other abnormalities of gait and mobility (R26.89);Difficulty in walking, not elsewhere classified (R26.2);Pain Pain - Right/Left: Right Pain - part of body: Knee    Time: 1610-9604 PT Time Calculation (min) (ACUTE ONLY): 28 min   Charges:   PT Evaluation $PT Eval Moderate Complexity: 1 Mod PT Treatments $Gait Training: 8-22 mins        Aida Raider, PT  Office # [phone] Pager [phone]   Ilda Foil 02/12/2018, 1:54 PM

## 2018-02-12 NOTE — Evaluation (Signed)
Speech Language Pathology Evaluation Patient Details Name: Zarren Penfold MRN: [id number] DOB: [birth year] Today's Date: 02/12/2018 Time: 6295-2841 SLP Time Calculation (min) (ACUTE ONLY): 30 min  Problem List:  Patient Active Problem List   Diagnosis Date Noted  . CVA (cerebral vascular accident) (HCC) 02/11/2018  . Acute kidney injury superimposed on chronic kidney disease (HCC) 02/11/2018  . History of syphilis 02/11/2018  . Constipation 02/11/2018  . Osteoarthritis 02/11/2018  . Benign essential HTN 02/11/2018  . Acute encephalopathy 02/10/2018   Past Medical History:  Past Medical History:  Diagnosis Date  . Arthritis   . High cholesterol   . Hypertension    Past Surgical History: History reviewed. No pertinent surgical history. HPI:  62 yo male with h/o syphilis adm to [facility] with 5-6 month progressive speech deficits, RLE weakness, HTN, obesity. Pt CT showed left MCA, Left anterior cerebral artery infarcts - ? embolic.  Pt is LEFT handed and speech evaluation ordered.  Also concern present for possible neurosyphilis.     Assessment / Plan / Recommendation Clinical Impression  Possible minimal dysarthria noted but uncertain if is dialectal and pt is 100% intelligible.  He states his speech is better now than it was 3 weeks ago.  MOCA Blind presented due to pt's reported vision deficits.  He demonstrated significant impairment in cognition most notably in attention and memory.  Pt score was 11/22.  He states these changes are new with this even - ? baseline with h/o substance use and no family present to help establish baseline.  Reviewed findings with pt and recommendations for follow given his purported new changes.  He is willing and desires follow up SLP.  Recommend consider CIR to help maximize his function and decrease caregiver burden.  Pt states he is homeless but his mother is working on helping him to locate supportive environment.      SLP Assessment  SLP  Recommendation/Assessment: Patient needs continued Speech Lanaguage Pathology Services SLP Visit Diagnosis: Attention and concentration deficit;Cognitive communication deficit (R41.841)    Follow Up Recommendations  Inpatient Rehab    Frequency and Duration min 1 x/week  1 week      SLP Evaluation Cognition  Overall Cognitive Status: No family/caregiver present to determine baseline cognitive functioning Arousal/Alertness: Awake/alert Orientation Level: Oriented X4 Attention: Selective Sustained Attention: Appears intact Selective Attention: Impaired Selective Attention Impairment: Verbal complex Memory: Impaired Memory Impairment: Storage deficit;Retrieval deficit;Decreased recall of new information Awareness: Appears intact Problem Solving: Impaired Problem Solving Impairment: Verbal complex Safety/Judgment: Appears intact       Comprehension  Auditory Comprehension Overall Auditory Comprehension: Appears within functional limits for tasks assessed Yes/No Questions: Not tested Commands: Within Functional Limits Conversation: Complex Interfering Components: Attention;Processing speed;Working Radio broadcast assistant: Extra processing time;Increased volume;Repetition Visual Recognition/Discrimination Discrimination: Not tested Reading Comprehension Reading Status: Not tested    Expression Expression Primary Mode of Expression: Verbal Verbal Expression Overall Verbal Expression: Appears within functional limits for tasks assessed Initiation: No impairment Repetition: No impairment Naming: Not tested Pragmatics: No impairment Written Expression Dominant Hand: Left Written Expression: (dnt)   Oral / Motor  Oral Motor/Sensory Function Overall Oral Motor/Sensory Function: Mild impairment Facial Symmetry: Abnormal symmetry right(slight) Motor Speech Overall Motor Speech: Appears within functional limits for tasks assessed Respiration: Within functional  limits Phonation: Normal Resonance: Within functional limits Articulation: Impaired Level of Impairment: (complex consonants, multisyllabic worse) Intelligibility: Intelligible Motor Planning: Witnin functional limits Motor Speech Errors: Not applicable   GO  Chales Abrahams 02/12/2018, 11:33 AM Donavan Burnet, MS [facility] SLP Acute Rehab Services Pager [phone] Office [phone]

## 2018-02-12 NOTE — Evaluation (Addendum)
Occupational Therapy Evaluation Patient Details Name: Robert Donovan MRN: [id number] DOB: [birth year] Today's Date: 02/12/2018    History of Present Illness Robert Donovan is a 62 y.o. male with medical history significant of HTN, HLD, CVA, obesity, and osteoarthritis. Reports progressive decline of the last 4-5 months, including slurred speech and R sided weakness with 2 recent falls.  MRI noted with many micro strokes L ACA and L MCA with areas of micro hemorrhage. Plan for LP to rule out conribution of syphilis. PMH signficant for: arthritis, HTN.    Clinical Impression   PTA patient reports able to complete ADLs and mobility without assist until recent falls.  He reports staying with his friend "Robert Donovan" or staying in his truck.  He was admitted for above and is limited by weakness and impaired coordination R UE/LE, decreased activity tolerance, impaired balance, impaired sensation and decreased safety awareness.  Currently completes UB ADL with min assist, LB ADL with mod assist +2 when standing, grooming with setup assistance, and transfers with max assist +2. Believe patient can return to a modified independent level given intensive rehab, therefore recommending CIR-level therapies. Patient appears to have family support, but is unsure of discharge plan.  Will continue to follow.     Follow Up Recommendations  CIR;Supervision/Assistance - 24 hour    Equipment Recommendations  Other (comment)(TBD at next venue of care)    Recommendations for Other Services Rehab consult;PT consult     Precautions / Restrictions Precautions Precautions: Fall Restrictions Weight Bearing Restrictions: No      Mobility Bed Mobility               General bed mobility comments: seated OOB in recliner upon entry  Transfers Overall transfer level: Needs assistance Equipment used: Rolling walker (2 wheeled) Transfers: Sit to/from Stand Sit to Stand: Max assist;+2 physical assistance          General transfer comment: +2 assist to stand with cueing for hand placement, sequencing and safety; physical assist to ascend and cueing/min assist to control descend    Balance Overall balance assessment: Needs assistance Sitting-balance support: No upper extremity supported;Feet supported Sitting balance-Leahy Scale: Fair     Standing balance support: During functional activity;Single extremity supported Standing balance-Leahy Scale: Poor Standing balance comment: reliant on at least 1 UE support                           ADL either performed or assessed with clinical judgement   ADL Overall ADL's : Needs assistance/impaired     Grooming: Set up;Sitting   Upper Body Bathing: Set up;Sitting   Lower Body Bathing: Sit to/from stand;Moderate assistance;+2 for physical assistance Lower Body Bathing Details (indicate cue type and reason): +2 for standing  Upper Body Dressing : Minimal assistance;Sitting   Lower Body Dressing: Moderate assistance;+2 for physical assistance;Sit to/from stand Lower Body Dressing Details (indicate cue type and reason): +2 for standing  Toilet Transfer: Maximal assistance;+2 for physical assistance(simulated in room)   Toileting- Clothing Manipulation and Hygiene: Maximal assistance;+2 for physical assistance;Sit to/from stand Toileting - Clothing Manipulation Details (indicate cue type and reason): +2 if standing      Functional mobility during ADLs: Maximal assistance;+2 for physical assistance;Rolling walker(sit to stand only) General ADL Comments: Patient requires +2 to transition to standing,  able to complete forward and backward steps from recliner.      Vision Patient Visual Report: No change from baseline(reports needing glasses ) Vision Assessment?:  Yes Eye Alignment: Within Functional Limits Ocular Range of Motion: Within Functional Limits Alignment/Gaze Preference: Within Defined Limits Tracking/Visual Pursuits: Able to  track stimulus in all quads without difficulty Saccades: Within functional limits Convergence: Within functional limits     Perception Perception Comments: further assessment on visual perception recommended    Praxis      Pertinent Vitals/Pain Pain Assessment: No/denies pain Pain Score: 2  Pain Location: back, chronic Pain Descriptors / Indicators: Aching Pain Intervention(s): Limited activity within patient's tolerance;Monitored during session     Hand Dominance Left   Extremity/Trunk Assessment Upper Extremity Assessment Upper Extremity Assessment: RUE deficits/detail RUE Deficits / Details: grossly 3+/5, weakness and coordination limit functional use  RUE Sensation: decreased light touch RUE Coordination: decreased fine motor;decreased gross motor   Lower Extremity Assessment Lower Extremity Assessment: Defer to PT evaluation       Communication Communication Communication: No difficulties   Cognition Arousal/Alertness: Awake/alert Behavior During Therapy: WFL for tasks assessed/performed Overall Cognitive Status: No family/caregiver present to determine baseline cognitive functioning                                 General Comments: overall WFL for ADL tasks, decreased safety awareness (recommend further assessment )   General Comments       Exercises     Shoulder Instructions      Home Living Family/patient expects to be discharged to:: Unsure   Available Help at Discharge: Family;Friend(s);Available PRN/intermittently Type of Home: Homeless                           Additional Comments: reports unsure of discharge plan; possibity to stay with friend "Robert Donovan" and reports his mother is trying to help      Prior Functioning/Environment Level of Independence: Independent        Comments: prior to recent falls and weakness, patient reports independent with ADls and mobility (reports living with in his truck)        OT  Problem List: Decreased strength;Decreased range of motion;Decreased activity tolerance;Impaired balance (sitting and/or standing);Decreased coordination;Decreased safety awareness;Decreased knowledge of use of DME or AE;Decreased knowledge of precautions;Impaired UE functional use;Impaired sensation      OT Treatment/Interventions: Self-care/ADL training;Therapeutic exercise;Neuromuscular education;DME and/or AE instruction;Therapeutic activities;Patient/family education;Balance training;Energy conservation    OT Goals(Current goals can be found in the care plan section) Acute Rehab OT Goals Patient Stated Goal: to get stronger OT Goal Formulation: With patient Time For Goal Achievement: 02/26/18 Potential to Achieve Goals: Good ADL Goals Pt Will Perform Grooming: with modified independence;standing Pt Will Perform Lower Body Bathing: with modified independence;sit to/from stand;with adaptive equipment Pt Will Perform Upper Body Dressing: with modified independence;sitting Pt Will Perform Lower Body Dressing: with modified independence;sit to/from stand;with adaptive equipment Pt Will Transfer to Toilet: with modified independence;bedside commode;ambulating;regular height toilet Pt Will Perform Toileting - Clothing Manipulation and hygiene: with modified independence;sit to/from stand Pt/caregiver will Perform Home Exercise Program: Increased strength;Right Upper extremity;With written HEP provided;With theraband;With theraputty  OT Frequency: Min 2X/week   Barriers to D/C: Inaccessible home environment          Co-evaluation              AM-PAC PT "6 Clicks" Daily Activity     Outcome Measure Help from another person eating meals?: None Help from another person taking care of personal grooming?: A Little Help from  another person toileting, which includes using toliet, bedpan, or urinal?: Total Help from another person bathing (including washing, rinsing, drying)?: A Lot Help  from another person to put on and taking off regular upper body clothing?: A Little Help from another person to put on and taking off regular lower body clothing?: Total 6 Click Score: 14   End of Session Equipment Utilized During Treatment: Gait belt;Rolling walker Nurse Communication: Mobility status  Activity Tolerance: Patient tolerated treatment well Patient left: in chair;with call bell/phone within reach  OT Visit Diagnosis: Other abnormalities of gait and mobility (R26.89);Unsteadiness on feet (R26.81);Muscle weakness (generalized) (M62.81);History of falling (Z91.81);Hemiplegia and hemiparesis Hemiplegia - Right/Left: Right Hemiplegia - dominant/non-dominant: Non-Dominant Hemiplegia - caused by: Cerebral infarction                Time: 1136-1200 OT Time Calculation (min): 24 min Charges:  OT General Charges $OT Visit: 1 Visit OT Evaluation $OT Eval Moderate Complexity: 1 Mod OT Treatments $Self Care/Home Management : 8-22 mins  Chancy Milroy, OT Acute Rehabilitation Services Pager [phone] Office [phone]   Chancy Milroy 02/12/2018, 1:43 PM

## 2018-02-12 NOTE — Telephone Encounter (Signed)
LCSWA received incoming call from Mrs. Bass. She shared that pt has been admitted and has been exhibiting confusion.   LCSWA discussed importance of Advanced Directives and encouraged her to speak with hospital staff about requesting additional assistance with paperwork, if needed. No additional concerns were noted.

## 2018-02-13 ENCOUNTER — Encounter (HOSPITAL_COMMUNITY): Payer: Self-pay

## 2018-02-13 ENCOUNTER — Inpatient Hospital Stay (HOSPITAL_COMMUNITY): Payer: Medicaid Other

## 2018-02-13 LAB — BASIC METABOLIC PANEL
ANION GAP: 8 (ref 5–15)
BUN: 11 mg/dL (ref 6–20)
CALCIUM: 8.8 mg/dL — AB (ref 8.9–10.3)
CHLORIDE: 110 mmol/L (ref 98–111)
CO2: 21 mmol/L — AB (ref 22–32)
Creatinine, Ser: 1.12 mg/dL (ref 0.61–1.24)
GFR calc non Af Amer: >60 mL/min (ref >60)
Glucose, Bld: 102 mg/dL — ABNORMAL HIGH (ref 70–99)
Potassium: 3.8 mmol/L (ref 3.5–5.1)
Sodium: 139 mmol/L (ref 135–145)

## 2018-02-13 LAB — CRYPTOCOCCAL ANTIGEN, CSF: Crypto Ag: NEGATIVE

## 2018-02-13 MED ORDER — CLOPIDOGREL BISULFATE 75 MG PO TABS
75.0000 mg | ORAL_TABLET | Freq: Every day | ORAL | Status: DC
  Administered 2018-02-13 – 2018-02-18 (×6): 75 mg via ORAL
  Filled 2018-02-13 (×6): qty 1

## 2018-02-13 MED ORDER — IOPAMIDOL (ISOVUE-370) INJECTION 76%
INTRAVENOUS | Status: AC
  Administered 2018-02-13: 50 mL
  Filled 2018-02-13: qty 50

## 2018-02-13 MED ORDER — HEPARIN SODIUM (PORCINE) 5000 UNIT/ML IJ SOLN
5000.0000 [IU] | Freq: Three times a day (TID) | INTRAMUSCULAR | Status: DC
  Administered 2018-02-13 – 2018-02-18 (×11): 5000 [IU] via SUBCUTANEOUS
  Filled 2018-02-13 (×10): qty 1

## 2018-02-13 NOTE — Progress Notes (Signed)
STROKE TEAM PROGRESS NOTE   SUBJECTIVE (INTERVAL HISTORY) No family is at the bedside.  Pt stated that he had Gonorrhea in the past and was treated with penicillin. However, does not know he had syphilis in the past. He still has right leg weakness. LP did not consistent with neurosyphilis, but VDRL pending.    OBJECTIVE Vitals:   02/12/18 2006 02/13/18 0032 02/13/18 0509 02/13/18 0938  BP: (!) 165/90 (!) 142/64 (!) 166/96 (!) 181/93  Pulse: 62 83 70 79  Resp: _0 Temp:  98 F (36.7 C) 98.4 F (36.9 C) 98.2 F (36.8 C)  TempSrc: Oral Oral Oral Oral  SpO2: 98% 94% 96% 96%  Weight:      Height:        CBC:  Recent Labs  Lab 02/10/18 1722 02/11/18 0345  WBC 10.5 10.4  NEUTROABS 6.6 6.3  HGB 16.2 14.4  HCT 48.6 44.4  MCV 87.1 87.7  PLT 247 035    Basic Metabolic Panel:  Recent Labs  Lab 02/11/18 0345 02/13/18 0745  NA 141 139  K 3.3* 3.8  CL 106 110  CO2 22 21*  GLUCOSE 127* 102*  BUN 20 11  CREATININE 1.39* 1.12  CALCIUM 9.1 8.8*    Lipid Panel:     Component Value Date/Time   CHOL 120 02/11/2018 0345   TRIG 82 02/11/2018 0345   HDL 28 (L) 02/11/2018 0345   CHOLHDL 4.3 02/11/2018 0345   VLDL 16 02/11/2018 0345   LDLCALC 76 02/11/2018 0345   HgbA1c:  Lab Results  Component Value Date   HGBA1C 5.3 02/11/2018   Urine Drug Screen:     Component Value Date/Time   LABOPIA POSITIVE (A) 02/10/2018 1722   COCAINSCRNUR NONE DETECTED 02/10/2018 1722   LABBENZ NONE DETECTED 02/10/2018 1722   AMPHETMU NONE DETECTED 02/10/2018 1722   THCU NONE DETECTED 02/10/2018 1722   LABBARB NONE DETECTED 02/10/2018 1722    Alcohol Level No results found for: ETH  IMAGING  Ct Angio Head W Or Wo Contrast Ct Angio Neck W Or Wo Contrast 02/13/2018 IMPRESSION:  1. No emergent finding to explain left cerebral infarcts.  2. The non dominant left vertebral artery is non-opacified in the lower neck with distal reconstitution or retrograde flow.  3. Moderate  distal basilar and left P1 segment stenosis.  4. Up to 50% carotid siphon narrowing bilaterally.    Transthoracic Echocardiogram with bubble study - Normal LV systolic function   Grade 1 diastolic dysfunction.   Technically difficult images .  Bilateral LE Dopplers - pending  Ct Head Wo Contrast Result Date: 02/10/2018 CLINICAL DATA:  Altered mental status EXAM: CT HEAD WITHOUT CONTRAST TECHNIQUE: Contiguous axial images were obtained from the base of the skull through the vertex without intravenous contrast. COMPARISON:  09/24/2017 CT FINDINGS: Brain: No acute territorial infarction, hemorrhage or intracranial mass. More focal hypodensity within the left subcortical white matter, new since 09/24/2017 comparison CT. Chronic infarcts within the left thalamus and pons. Moderate small vessel ischemic changes of the white matter. Stable ventricle size. Vascular: No hyperdense vessels.  Carotid vascular calcification Skull: Normal. Negative for fracture or focal lesion. Sinuses/Orbits: No acute finding. Other: None IMPRESSION: 1. Focal hypodensity within the left parasagittal frontal lobe subcortical white matter, which may reflect age indeterminate white matter infarct or possible small focus of edema. This is new since 09/24/2017. 2. Negative for acute hemorrhage. 3. Atrophy with moderate small vessel ischemic changes of the white matter 4.  Chronic lacunar infarcts in the pons and left thalamus. Electronically Signed   By: Donavan Foil M.D.   On: 02/10/2018 18:40   Mr Jeri Cos And Wo Contrast Result Date: 02/11/2018 CLINICAL DATA:  Confusion and able to ambulate. Abnormal CT head. Rule out neuro syphilis. EXAM: MRI HEAD WITHOUT AND WITH CONTRAST TECHNIQUE: Multiplanar, multiecho pulse sequences of the brain and surrounding structures were obtained without and with intravenous contrast. CONTRAST:  10 mL Gadovist IV COMPARISON:  CT head 02/10/2018 FINDINGS: Brain: Multiple areas of acute infarction.  Largest areas over the left frontal convexity in the anterior cerebral artery territory. Small areas of acute infarct in the left parietal periventricular white matter and left posterior temporal white matter. Extensive chronic microvascular ischemic changes throughout the white matter. Numerous chronic lacunar infarctions in the pons bilaterally, thalamus bilaterally and basal ganglia bilaterally. Multiple areas of chronic microhemorrhage in the brain including the left pons, right thalamus, right occipital lobe, left temporal lobe. Negative for mass lesion. Normal enhancement postcontrast administration. Vascular: Normal arterial flow voids Skull and upper cervical spine: Negative Sinuses/Orbits: Mild mucosal edema paranasal sinuses.  Normal orbit Other: None IMPRESSION: Multiple areas of acute infarct in the left anterior cerebral artery and left middle cerebral artery territory suggesting emboli. Advanced chronic ischemic changes as described above. Multiple areas of chronic microhemorrhage in the brain likely due to poorly controlled hypertension. If there is continued concern of neuro syphilis, lumbar puncture suggested. Electronically Signed   By: Franchot Gallo M.D.   On: 02/11/2018 07:57    PHYSICAL EXAM  Temp:  [98 F (36.7 C)-98.6 F (37 C)] 98.2 F (36.8 C) (09/14 1558) Pulse Rate:  [59-83] 59 (09/14 1558) Resp:  [16-19] 16 (09/14 1558) BP: (142-186)/(64-96) 186/84 (09/14 1558) SpO2:  [94 %-98 %] 96 % (09/14 1558)  General - Well nourished, well developed, in no apparent distress.  Ophthalmologic - fundi not visualized due to noncooperation.  Cardiovascular - Regular rate and rhythm.  Mental Status -  Level of arousal and orientation to time, place, and person were intact. Language including expression, naming, repetition, comprehension was assessed and found intact. Fund of Knowledge was assessed and was intact.  Cranial Nerves II - XII - II - Visual field intact OU. III, IV,  VI - Extraocular movements intact. V - Facial sensation intact bilaterally. VII - Facial movement intact bilaterally. VIII - Hearing & vestibular intact bilaterally. X - Palate elevates symmetrically. XI - Chin turning & shoulder shrug intact bilaterally. XII - Tongue protrusion intact.  Motor Strength - The patient's strength was normal in all extremities except mild right hand dexterity difficulty and RLE 0/5 proximal and distal and pronator drift was absent.  Bulk was normal and fasciculations were absent.   Motor Tone - Muscle tone was assessed at the neck and appendages and was normal.  Reflexes - The patient's reflexes were symmetrical in all extremities and he had right positive babinski  Sensory - Light touch, temperature/pinprick were assessed and were symmetrical.    Coordination - The patient had normal movements in the hands with no ataxia or dysmetria.  Tremor was absent.  Gait and Station - deferred.   ASSESSMENT/PLAN [name] is a 62 y.o. male with history of previous stroke, hyperlipidemia, remote syphilis, and hypertension presenting with slurred speech, falls, and dragging lt foot . He did not receive IV t-PA due to late presentation.  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  MRI head - Multiple areas of acute infarct in the left anterior cerebral artery and left middle cerebral artery territory suggesting emboli.  Chronic bilateral pontine, left thalamic and right ACA punctate infarcts.  CTA H&N - Up to 50% carotid siphon narrowing bilaterally.  Distal basilar and left P1 moderate stenosis.  2D Echo - unremarkable  LE venous Doppler pending  LDL - 76  HgbA1c - 5.3  Hypercoagulable and autoimmune work-up  pending  Consider TEE if neurosyphilis ruled out.  VTE prophylaxis -heparin subq  aspirin 81 mg daily prior to admission, now on aspirin 81 mg daily and clopidogrel 75 mg daily.  Continue DAPT for 3 weeks and then Plavix alone.  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 penicillin on the street, not through health department), but not known syphilis diagnosis  He admitted that he had gonorrhea diagnosis in the past but treated with a penicillin (not sure why gonorrhea was treated by penicillin)  May consider to involve ID  Positive ANA  ANA positive  ESR CRP negative  Autoimmune work-up pending  Hypertension  Stable . Permissive hypertension (OK if < 220/120) but gradually normalize in 5-7 days . Long-term BP goal normotensive  Hyperlipidemia  Lipid lowering medication PTA: Lipitor 40 mg daily  LDL 76, goal < 70  Current lipid lowering medication: Lipitor 40 mg daily  Continue statin at discharge   Other Stroke Risk Factors  Advanced age  Former cigarette smoker - quit  ETOH use, advised to drink no more than 1 alcoholic beverage per day.  Obesity, Body mass index is 43.23 kg/m., recommend weight loss, diet and exercise as appropriate   Previous stroke imaging  Other Active Problems  Elevated creatinine, 1.66-1.39-1.12   Hospital day # 3  I spent  35 minutes in total face-to-face time with the patient, more than 50% of which was spent in counseling and coordination of care, reviewing test results, images and  medication, and discussing the diagnosis of embolic stroke, syphilis, neurosyphilis, and ANA positive, treatment plan and potential prognosis. This patient's care requiresreview of multiple databases, neurological assessment, discussion with family, other specialists and medical decision making of high complexity.   Rosalin Hawking, MD PhD Stroke Neurology 02/13/2018 5:29 PM    To contact Stroke Continuity provider, please refer to [email or web address] After hours, contact General Neurology

## 2018-02-13 NOTE — Progress Notes (Signed)
PROGRESS NOTE [facility]   Boris Engelmann   UJW:[id number] DOB: 03-09-1956  DOA: 02/10/2018 PCP: Loletta Specter, PA-C   Brief Narrative:  Patient is a 62 year old male with medical history significant for hypertension, hyperlipidemia, CVA in the past, obesity and osteoarthritis who presented to the emergency department after being seen by PCP for positive blood test of syphilis during his last evaluation.  Per H&P patient report that he contracted syphilis at the age of 89 or 61 and reported being treated multiple times.  He also reported over the past 4 to 5 months he has progressively declined with slurred speech, right foot dragging and inability to lift up, multiple falls and confusion started time.  Upon ED evaluation, CT of the head without contrast revealed focal hypodensity of the left parasagittal frontal lobe in the subcortical white matter which was new when compared to previous images from April.  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  Neurology input is highly appreciated.  Subjective: Patient seen. Right lower extremity weakness persists.  Assessment & Plan: Acute metabolic encephalopathy Work-up has revealed acute CVA.   History of syphilis noted.    Acute CVA MRI Brain: Multiple areas of acute infarct in the left anterior cerebral artery and left middle cerebral artery territory suggesting emboli.  Multiple areas of chronic microhemorrhage in the brain. NIH score 7.  Neurology has been consulted recommended transfer to [facility] for further work-up.  Echocardiogram with bubble study has been ordered to rule out PFO.  Patient on aspirin and Lipitor.  Will check A1c, lipid panel and update PT and OT consult.  Follow neurology recommendations.  New neurochecks and fall precautions. 02/13/2018: Patient is currently on aspirin, Plavix and Lipitor.  Neurology is directing care.  Physical therapy input is appreciated.  AKI: Likely  prerenal and continuation of diuretics.  Creatinine on admission 1.66 Creatinine has improved after IV hydration.  Continue IV fluids for now.  Avoid nephrotoxic agent and check renal function in a.m. 02/13/2018: Serum creatinine today is 1.12.  Essentially, AKI has resolved.    Positive syphilis results Patient with contradicting stories regarding having syphilis in the past versus gonorrhea. However current lab work-up shows ANA, T palladium ABS and RPR positive with quantitative of 1:2, this also could be contributing to acute metabolic encephalopathy. LP has been ordered, will need to hold Lovenox tonight.  Case discussed with ID and will see in consultation.  Recommending to hold on antibiotic until definitive diagnosis has been established. 02/13/2018: Infectious disease input is appreciated.  Follow CSF analysis.  Hep A vaccine prior to discharge.  Work-up is still in progress  Essential hypertension Slight elevated BP, will allow permissive hypertension.  Continue to hold lisinopril and hydrochlorothiazide in setting of AKI. Monitor BP closely 02/13/2018: BP is improving slowly.    Homeless Social work consulted.  DVT prophylaxis: Lovenox - hold tonight for LP  Code Status: Full Code  Family Communication: None at bedside  Disposition Plan: Transfer to [facility] for further evaluation and treatment.   Consultants:   ID  Neurology   Procedures:   None   Antimicrobials:  None     Objective: Vitals:   02/13/18 0032 02/13/18 0509 02/13/18 0938 02/13/18 1558  BP: (!) 142/64 (!) 166/96 (!) 181/93 (!) 186/84  Pulse: 83 70 79 (!) 59  Resp: 18 19 18 16   Temp: 98 F (36.7 C) 98.4 F (36.9 C) 98.2 F (36.8 C) 98.2 F (36.8 C)  TempSrc: Oral Oral Oral Oral  SpO2: 94% 96% 96% 96%  Weight:      Height:        Intake/Output Summary (Last 24 hours) at 02/13/2018 1649 Last data filed at 02/13/2018 0800 Gross per 24 hour  Intake 480 ml  Output -  Net 480 ml   Filed  Weights   02/11/18 2033  Weight: 132.8 kg    Examination:  General exam: NAD, slurred speech  HEENT: OP clear, poor dentition  Respiratory system: Clear to auscultation. No wheezes,crackle or rhonchi Cardiovascular system: S1 & S2 heard. Gastrointestinal system: Abdomen is obese, soft and nontender.  Organs are difficult to assess.  Central nervous system: Alert and oriented.R LE weakness.  Extremities: Trace B/L LE edema. Skin: No rashes Psychiatry: Mood & affect appropriate.    Data Reviewed: I have personally reviewed following labs and imaging studies  CBC: Recent Labs  Lab 02/08/18 1506 02/10/18 1722 02/11/18 0345  WBC 10.5 10.5 10.4  NEUTROABS 6.9 6.6 6.3  HGB 15.3 16.2 14.4  HCT 47.5 48.6 44.4  MCV 88 87.1 87.7  PLT 253 247 206   Basic Metabolic Panel: Recent Labs  Lab 02/08/18 1506 02/10/18 1722 02/11/18 0345 02/13/18 0745  NA 143 141 141 139  K 4.0 3.5 3.3* 3.8  CL 104 106 106 110  CO2 22 24 22  21*  GLUCOSE 76 108* 127* 102*  BUN 15 22* 20 11  CREATININE 1.34* 1.66* 1.39* 1.12  CALCIUM 9.8 9.9 9.1 8.8*   GFR: Estimated Creatinine Clearance: 94.7 mL/min (by C-G formula based on SCr of 1.12 mg/dL). Liver Function Tests: Recent Labs  Lab 02/08/18 1506  AST 21  ALT 12  ALKPHOS 102  BILITOT 1.0  PROT 7.2  ALBUMIN 4.4   No results for input(s): LIPASE, AMYLASE in the last 168 hours. No results for input(s): AMMONIA in the last 168 hours. Coagulation Profile: No results for input(s): INR, PROTIME in the last 168 hours. Cardiac Enzymes: No results for input(s): CKTOTAL, CKMB, CKMBINDEX, TROPONINI in the last 168 hours. BNP (last 3 results) No results for input(s): PROBNP in the last 8760 hours. HbA1C: Recent Labs    02/11/18 0345  HGBA1C 5.3   CBG: No results for input(s): GLUCAP in the last 168 hours. Lipid Profile: Recent Labs    02/11/18 0345  CHOL 120  HDL 28*  LDLCALC 76  TRIG 82  CHOLHDL 4.3   Thyroid Function Tests: No  results for input(s): TSH, T4TOTAL, FREET4, T3FREE, THYROIDAB in the last 72 hours. Anemia Panel: No results for input(s): VITAMINB12, FOLATE, FERRITIN, TIBC, IRON, RETICCTPCT in the last 72 hours. Sepsis Labs: No results for input(s): PROCALCITON, LATICACIDVEN in the last 168 hours.  Recent Results (from the past 240 hour(s))  CSF culture     Status: None (Preliminary result)   Collection Time: 02/12/18  4:17 PM  Result Value Ref Range Status   Specimen Description CSF  Final   Special Requests NONE  Final   Gram Stain NO WBC SEEN NO ORGANISMS SEEN CYTOSPIN SMEAR   Final   Culture   Final    NO GROWTH < 24 HOURS Performed at [facility], [address] [name], [place] [id number]    Report Status PENDING  Incomplete      Radiology Studies: Ct Angio Head W Or Wo Contrast  Result Date: 02/13/2018 CLINICAL DATA:  Stroke follow-up EXAM: CT ANGIOGRAPHY HEAD AND NECK TECHNIQUE: Multidetector CT imaging of the head and neck  was performed using the standard protocol during bolus administration of intravenous contrast. Multiplanar CT image reconstructions and MIPs were obtained to evaluate the vascular anatomy. Carotid stenosis measurements (when applicable) are obtained utilizing NASCET criteria, using the distal internal carotid diameter as the denominator. CONTRAST:  50mL ISOVUE-370 IOPAMIDOL (ISOVUE-370) INJECTION 76% COMPARISON:  Brain MRI from 2 days ago FINDINGS: CTA NECK FINDINGS Aortic arch: 2 vessel branching pattern. Right carotid system: Diffuse atheromatous wall thickening of the common carotid and proximal ICA, noncalcified. No focal or flow limiting stenosis. No ulceration or beading. Left carotid system: Diffuse atheromatous wall thickening of the common carotid and proximal ICA. No focal or flow limiting stenosis. No ulceration or beading. Vertebral arteries: Proximal subclavian plaque on the left most notably. The dominant right vertebral artery is widely patent to the  dura. No flow is seen in the left vertebral artery to the distal V2 segment where there is progressive density that is likely from retrograde flow. Skeleton: Degenerative changes without acute or aggressive finding. Prominent periapical erosion about the left lower terminal molar. Other neck: No evidence of mass or inflammation. Upper chest: Negative Review of the MIP images confirms the above findings CTA HEAD FINDINGS Anterior circulation: Carotid siphon atherosclerotic calcification with up to 50% stenosis at the paraclinoid segment on the right. A similar degree of stenosis likely present at the left anterior genu, but limited by calcified plaque blooming and artifact from the skull base. Hypoplastic right A1 segment. Evaluation of medium size vessels is complicated by neighboring opacified venous structures. No branch occlusion, beading, or flow limiting stenosis suspected. Posterior circulation: Dominant right vertebral artery as noted above. There is likely retrograde flow into the left vertebral, with patent left PICA. Left PICA and right AICA are dominant. Moderate narrowing of the distal basilar. Moderate narrowing at the left P1 segment. Venous sinuses: Patent Anatomic variants: As above Delayed phase: No abnormal intracranial enhancement. Review of the MIP images confirms the above findings IMPRESSION: 1. No emergent finding to explain left cerebral infarcts. 2. The non dominant left vertebral artery is non-opacified in the lower neck with distal reconstitution or retrograde flow. 3. Moderate distal basilar and left P1 segment stenosis. 4. Up to 50% carotid siphon narrowing bilaterally. Electronically Signed   By: Marnee Spring M.D.   On: 02/13/2018 13:08   Ct Angio Neck W Or Wo Contrast  Result Date: 02/13/2018 CLINICAL DATA:  Stroke follow-up EXAM: CT ANGIOGRAPHY HEAD AND NECK TECHNIQUE: Multidetector CT imaging of the head and neck was performed using the standard protocol during bolus  administration of intravenous contrast. Multiplanar CT image reconstructions and MIPs were obtained to evaluate the vascular anatomy. Carotid stenosis measurements (when applicable) are obtained utilizing NASCET criteria, using the distal internal carotid diameter as the denominator. CONTRAST:  50mL ISOVUE-370 IOPAMIDOL (ISOVUE-370) INJECTION 76% COMPARISON:  Brain MRI from 2 days ago FINDINGS: CTA NECK FINDINGS Aortic arch: 2 vessel branching pattern. Right carotid system: Diffuse atheromatous wall thickening of the common carotid and proximal ICA, noncalcified. No focal or flow limiting stenosis. No ulceration or beading. Left carotid system: Diffuse atheromatous wall thickening of the common carotid and proximal ICA. No focal or flow limiting stenosis. No ulceration or beading. Vertebral arteries: Proximal subclavian plaque on the left most notably. The dominant right vertebral artery is widely patent to the dura. No flow is seen in the left vertebral artery to the distal V2 segment where there is progressive density that is likely from retrograde flow. Skeleton: Degenerative changes without  acute or aggressive finding. Prominent periapical erosion about the left lower terminal molar. Other neck: No evidence of mass or inflammation. Upper chest: Negative Review of the MIP images confirms the above findings CTA HEAD FINDINGS Anterior circulation: Carotid siphon atherosclerotic calcification with up to 50% stenosis at the paraclinoid segment on the right. A similar degree of stenosis likely present at the left anterior genu, but limited by calcified plaque blooming and artifact from the skull base. Hypoplastic right A1 segment. Evaluation of medium size vessels is complicated by neighboring opacified venous structures. No branch occlusion, beading, or flow limiting stenosis suspected. Posterior circulation: Dominant right vertebral artery as noted above. There is likely retrograde flow into the left vertebral, with  patent left PICA. Left PICA and right AICA are dominant. Moderate narrowing of the distal basilar. Moderate narrowing at the left P1 segment. Venous sinuses: Patent Anatomic variants: As above Delayed phase: No abnormal intracranial enhancement. Review of the MIP images confirms the above findings IMPRESSION: 1. No emergent finding to explain left cerebral infarcts. 2. The non dominant left vertebral artery is non-opacified in the lower neck with distal reconstitution or retrograde flow. 3. Moderate distal basilar and left P1 segment stenosis. 4. Up to 50% carotid siphon narrowing bilaterally. Electronically Signed   By: Marnee Spring M.D.   On: 02/13/2018 13:08   Dg Fluoro Guided Loc Of Needle/cath Tip For Spinal Inject Lt  Result Date: 02/12/2018 CLINICAL DATA:  Acute encephalopathy EXAM: DIAGNOSTIC LUMBAR PUNCTURE UNDER FLUOROSCOPIC GUIDANCE FLUOROSCOPY TIME:  Fluoroscopy Time:  1 minutes 12 second Radiation Exposure Index (if provided by the fluoroscopic device): Number of Acquired Spot Images: 0 PROCEDURE: Informed consent was obtained from the patient prior to the procedure, including potential complications of headache, allergy, and pain. With the patient prone, the lower back was prepped with Betadine. 1% Lidocaine was used for local anesthesia. Lumbar puncture was performed at the L2-3 level using a 20 gauge needle with return of clear CSF with an opening pressure of 20 cm water. 8 ml of CSF were obtained for laboratory studies. The patient tolerated the procedure well and there were no apparent complications. IMPRESSION: Successful lumbar puncture using fluoroscopy. Electronically Signed   By: Marlan Palau M.D.   On: 02/12/2018 16:48      Scheduled Meds: .  stroke: mapping our early stages of recovery book   Does not apply Once  . aspirin EC  81 mg Oral Daily  . atorvastatin  40 mg Oral Daily  . clopidogrel  75 mg Oral Daily  . senna-docusate  1 tablet Oral BID   Continuous  Infusions:    LOS: 3 days    Time spent: 25 minutes.     Barnetta Chapel, MD Pager: Text Page via [email or web address]   If 7PM-7AM, please contact night-coverage [email or web address] 02/13/2018, 4:49 PM

## 2018-02-13 NOTE — Progress Notes (Signed)
Occupational Therapy Treatment Patient Details Name: Robert Donovan MRN: [id number] DOB: 04/21/1956 Today's Date: 02/13/2018    History of present illness Robert Donovan is a 62 y.o. male with medical history significant of HTN, HLD, CVA, obesity, and osteoarthritis. Reports progressive decline of the last 4-5 months, including slurred speech and R sided weakness with 2 recent falls.  MRI noted with many micro strokes L ACA and L MCA with areas of micro hemorrhage. Plan for LP to rule out conribution of syphilis. PMH signficant for: arthritis, HTN.    OT comments  Pt progressing towards acute OT goals. Sitting up in chair and eager to work with therapy. Reviewed RUE therapeutic activities and completed 1x sit<>stand from recliner. Noted that pt was not approved for CIR. Pt will benefit from [facility] for rehab.    Follow Up Recommendations  CIR;Supervision/Assistance - 24 hour;SNF;Other (comment)(noted not approved for CIR)    Equipment Recommendations  Other (comment)(TBD at next venue of care)    Recommendations for Other Services      Precautions / Restrictions Precautions Precautions: Fall Restrictions Weight Bearing Restrictions: No       Mobility Bed Mobility               General bed mobility comments: in recliner on arrival  Transfers Overall transfer level: Needs assistance Equipment used: Rolling walker (2 wheeled) Transfers: Sit to/from Stand Sit to Stand: Max assist         General transfer comment: Trialed first with pt pushing up from armrests. Pt reports he does better to grab rw across front bar. Therapist stabilizing rw during stand with pt successfully standing with mod-max A. Advised pt to have someone stabilize rw before he tries to stand up that way. Momentum utilized. Increased tome and effort.    Balance Overall balance assessment: Needs assistance Sitting-balance support: No upper extremity supported;Feet supported Sitting balance-Leahy Scale:  Fair     Standing balance support: Bilateral upper extremity supported;During functional activity Standing balance-Leahy Scale: Poor Standing balance comment: heavy reliance on RW                           ADL either performed or assessed with clinical judgement   ADL Overall ADL's : Needs assistance/impaired                                       General ADL Comments: Sit<>stand from recliner with max A. Min A to walk in place with rw.     Vision       Perception     Praxis      Cognition Arousal/Alertness: Awake/alert Behavior During Therapy: WFL for tasks assessed/performed Overall Cognitive Status: No family/caregiver present to determine baseline cognitive functioning                                 General Comments: answers questions appropriately, oriented        Exercises     Shoulder Instructions       General Comments      Pertinent Vitals/ Pain       Pain Assessment: No/denies pain  Home Living  Prior Functioning/Environment              Frequency  Min 2X/week        Progress Toward Goals  OT Goals(current goals can now be found in the care plan section)  Progress towards OT goals: Progressing toward goals  Acute Rehab OT Goals Patient Stated Goal: to get stronger OT Goal Formulation: With patient Time For Goal Achievement: 02/26/18 Potential to Achieve Goals: Good ADL Goals Pt Will Perform Grooming: with modified independence;standing Pt Will Perform Lower Body Bathing: with modified independence;sit to/from stand;with adaptive equipment Pt Will Perform Upper Body Dressing: with modified independence;sitting Pt Will Perform Lower Body Dressing: with modified independence;sit to/from stand;with adaptive equipment Pt Will Transfer to Toilet: with modified independence;bedside commode;ambulating;regular height toilet Pt Will Perform  Toileting - Clothing Manipulation and hygiene: with modified independence;sit to/from stand Pt/caregiver will Perform Home Exercise Program: Increased strength;Right Upper extremity;With written HEP provided;With theraband;With theraputty  Plan Discharge plan remains appropriate    Co-evaluation                 AM-PAC PT "6 Clicks" Daily Activity     Outcome Measure   Help from another person eating meals?: None Help from another person taking care of personal grooming?: A Little Help from another person toileting, which includes using toliet, bedpan, or urinal?: Total Help from another person bathing (including washing, rinsing, drying)?: A Lot Help from another person to put on and taking off regular upper body clothing?: A Little Help from another person to put on and taking off regular lower body clothing?: A Lot 6 Click Score: 15    End of Session Equipment Utilized During Treatment: Gait belt;Rolling walker  OT Visit Diagnosis: Other abnormalities of gait and mobility (R26.89);Unsteadiness on feet (R26.81);Muscle weakness (generalized) (M62.81);History of falling (Z91.81);Hemiplegia and hemiparesis Hemiplegia - Right/Left: Right Hemiplegia - dominant/non-dominant: Non-Dominant Hemiplegia - caused by: Cerebral infarction   Activity Tolerance Patient tolerated treatment well   Patient Left in chair;with call bell/phone within reach   Nurse Communication          Time: 4098-1191 OT Time Calculation (min): 20 min  Charges: OT General Charges $OT Visit: 1 Visit OT Treatments $Self Care/Home Management : 8-22 mins  Raynald Kemp, OT Acute Rehabilitation Services Pager: [phone] Office: [phone]     Pilar Grammes 02/13/2018, 12:41 PM

## 2018-02-14 ENCOUNTER — Inpatient Hospital Stay (HOSPITAL_COMMUNITY): Payer: Medicaid Other

## 2018-02-14 DIAGNOSIS — I639 Cerebral infarction, unspecified: Secondary | ICD-10-CM

## 2018-02-14 DIAGNOSIS — A539 Syphilis, unspecified: Secondary | ICD-10-CM

## 2018-02-14 LAB — LUPUS ANTICOAGULANT PANEL
DRVVT: 32.7 s (ref 0.0–47.0)
PTT Lupus Anticoagulant: 35.8 s (ref 0.0–51.9)

## 2018-02-14 LAB — C3 COMPLEMENT: C3 COMPLEMENT: 116 mg/dL (ref 82–167)

## 2018-02-14 LAB — BETA-2-GLYCOPROTEIN I ABS, IGG/M/A: Beta-2 Glyco I IgG: <9 GPI IgG units (ref 0–20)

## 2018-02-14 LAB — MPO/PR-3 (ANCA) ANTIBODIES
ANCA Proteinase 3: <3.5 U/mL (ref 0.0–3.5)
Myeloperoxidase Abs: <9 U/mL (ref 0.0–9.0)

## 2018-02-14 LAB — C4 COMPLEMENT: COMPLEMENT C4, BODY FLUID: 34 mg/dL (ref 14–44)

## 2018-02-14 LAB — RHEUMATOID FACTOR: Rhuematoid fact SerPl-aCnc: <10 IU/mL (ref 0.0–13.9)

## 2018-02-14 NOTE — Progress Notes (Signed)
STROKE TEAM PROGRESS NOTE   SUBJECTIVE (INTERVAL HISTORY) No family is at the bedside.  Pt stated that he had Gonorrhea in the past and was treated with penicillin. However, does not know he had syphilis in the past. He still has right leg weakness. LP not consistent with neurosyphilis, but VDRL pending.    OBJECTIVE Vitals:   02/13/18 1956 02/13/18 2320 02/14/18 0441 02/14/18 1148  BP: (!) 181/82 137/66 (!) 151/77 (!) 172/86  Pulse: 79 68 77 71  Resp:  _0 Temp: 98.4 F (36.9 C) 98.5 F (36.9 C) 98.4 F (36.9 C) 98.5 F (36.9 C)  TempSrc: Oral Oral Oral Oral  SpO2: 99% 97% 98% 94%  Weight:      Height:        CBC:  Recent Labs  Lab 02/10/18 1722 02/11/18 0345  WBC 10.5 10.4  NEUTROABS 6.6 6.3  HGB 16.2 14.4  HCT 48.6 44.4  MCV 87.1 87.7  PLT 247 811    Basic Metabolic Panel:  Recent Labs  Lab 02/11/18 0345 02/13/18 0745  NA 141 139  K 3.3* 3.8  CL 106 110  CO2 22 21*  GLUCOSE 127* 102*  BUN 20 11  CREATININE 1.39* 1.12  CALCIUM 9.1 8.8*    Lipid Panel:     Component Value Date/Time   CHOL 120 02/11/2018 0345   TRIG 82 02/11/2018 0345   HDL 28 (L) 02/11/2018 0345   CHOLHDL 4.3 02/11/2018 0345   VLDL 16 02/11/2018 0345   LDLCALC 76 02/11/2018 0345   HgbA1c:  Lab Results  Component Value Date   HGBA1C 5.3 02/11/2018   Urine Drug Screen:     Component Value Date/Time   LABOPIA POSITIVE (A) 02/10/2018 1722   COCAINSCRNUR NONE DETECTED 02/10/2018 1722   LABBENZ NONE DETECTED 02/10/2018 1722   AMPHETMU NONE DETECTED 02/10/2018 1722   THCU NONE DETECTED 02/10/2018 1722   LABBARB NONE DETECTED 02/10/2018 1722    Alcohol Level No results found for: ETH  IMAGING  Ct Angio Head W Or Wo Contrast Ct Angio Neck W Or Wo Contrast 02/13/2018 IMPRESSION:  1. No emergent finding to explain left cerebral infarcts.  2. The non dominant left vertebral artery is non-opacified in the lower neck with distal reconstitution or retrograde flow.  3.  Moderate distal basilar and left P1 segment stenosis.  4. Up to 50% carotid siphon narrowing bilaterally.    Transthoracic Echocardiogram with bubble study - Normal LV systolic function   Grade 1 diastolic dysfunction.   Technically difficult images.   Bilateral LE Dopplers 02/14/2018 Negative for DVT   Ct Head Wo Contrast Result Date: 02/10/2018 CLINICAL DATA:  Altered mental status EXAM: CT HEAD WITHOUT CONTRAST TECHNIQUE: Contiguous axial images were obtained from the base of the skull through the vertex without intravenous contrast. COMPARISON:  09/24/2017 CT FINDINGS: Brain: No acute territorial infarction, hemorrhage or intracranial mass. More focal hypodensity within the left subcortical white matter, new since 09/24/2017 comparison CT. Chronic infarcts within the left thalamus and pons. Moderate small vessel ischemic changes of the white matter. Stable ventricle size. Vascular: No hyperdense vessels.  Carotid vascular calcification Skull: Normal. Negative for fracture or focal lesion. Sinuses/Orbits: No acute finding. Other: None IMPRESSION: 1. Focal hypodensity within the left parasagittal frontal lobe subcortical white matter, which may reflect age indeterminate white matter infarct or possible small focus of edema. This is new since 09/24/2017. 2. Negative for acute hemorrhage. 3. Atrophy with moderate small vessel ischemic changes of  the white matter 4. Chronic lacunar infarcts in the pons and left thalamus. Electronically Signed   By: Donavan Foil M.D.   On: 02/10/2018 18:40   Mr Jeri Cos And Wo Contrast Result Date: 02/11/2018 CLINICAL DATA:  Confusion and able to ambulate. Abnormal CT head. Rule out neuro syphilis. EXAM: MRI HEAD WITHOUT AND WITH CONTRAST TECHNIQUE: Multiplanar, multiecho pulse sequences of the brain and surrounding structures were obtained without and with intravenous contrast. CONTRAST:  10 mL Gadovist IV COMPARISON:  CT head 02/10/2018 FINDINGS: Brain: Multiple  areas of acute infarction. Largest areas over the left frontal convexity in the anterior cerebral artery territory. Small areas of acute infarct in the left parietal periventricular white matter and left posterior temporal white matter. Extensive chronic microvascular ischemic changes throughout the white matter. Numerous chronic lacunar infarctions in the pons bilaterally, thalamus bilaterally and basal ganglia bilaterally. Multiple areas of chronic microhemorrhage in the brain including the left pons, right thalamus, right occipital lobe, left temporal lobe. Negative for mass lesion. Normal enhancement postcontrast administration. Vascular: Normal arterial flow voids Skull and upper cervical spine: Negative Sinuses/Orbits: Mild mucosal edema paranasal sinuses.  Normal orbit Other: None IMPRESSION: Multiple areas of acute infarct in the left anterior cerebral artery and left middle cerebral artery territory suggesting emboli. Advanced chronic ischemic changes as described above. Multiple areas of chronic microhemorrhage in the brain likely due to poorly controlled hypertension. If there is continued concern of neuro syphilis, lumbar puncture suggested. Electronically Signed   By: Franchot Gallo M.D.   On: 02/11/2018 07:57    PHYSICAL EXAM  Temp:  [98.2 F (36.8 C)-98.5 F (36.9 C)] 98.5 F (36.9 C) (09/15 1148) Pulse Rate:  [59-79] 71 (09/15 1148) Resp:  [16-18] 18 (09/15 1148) BP: (137-186)/(66-86) 172/86 (09/15 1148) SpO2:  [94 %-99 %] 94 % (09/15 1148)  General - Well nourished, well developed, in no apparent distress.  Ophthalmologic - fundi not visualized due to noncooperation.  Cardiovascular - Regular rate and rhythm.  Mental Status -  Level of arousal and orientation to time, place, and person were intact. Language including expression, naming, repetition, comprehension was assessed and found intact. Fund of Knowledge was assessed and was intact.  Cranial Nerves II - XII - II -  Visual field intact OU. III, IV, VI - Extraocular movements intact. V - Facial sensation intact bilaterally. VII - Facial movement intact bilaterally. VIII - Hearing & vestibular intact bilaterally. X - Palate elevates symmetrically. XI - Chin turning & shoulder shrug intact bilaterally. XII - Tongue protrusion intact.  Motor Strength - The patient's strength was normal in all extremities except mild right hand dexterity difficulty and RLE 0/5 proximal and distal and pronator drift was absent.  Bulk was normal and fasciculations were absent.   Motor Tone - Muscle tone was assessed at the neck and appendages and was normal.  Reflexes - The patient's reflexes were symmetrical in all extremities and he had right positive babinski  Sensory - Light touch, temperature/pinprick were assessed and were symmetrical.    Coordination - The patient had normal movements in the hands with no ataxia or dysmetria.  Tremor was absent.  Gait and Station - deferred.   ASSESSMENT/PLAN Mr. Rod Majerus is a 62 y.o. male with history of previous stroke, hyperlipidemia, remote syphilis, and hypertension presenting with slurred speech, falls, and dragging lt foot . He did not receive IV t-PA due to late presentation.  Stroke:  Multifocal left MCA/PCA and left CR, left  ACA  small and punctate infarcts, embolic pattern - unknown source, cardioemoblic vs. Vasculopathy from neurosyphilis  Resultant  Right leg plegia  CT head - left frontal infarct, chronic left thalamus and pontine infarcts.  MRI head - Multiple areas of acute infarct in the left anterior cerebral artery and left middle cerebral artery territory suggesting emboli.  Chronic bilateral pontine, left thalamic and right ACA punctate infarcts.  CTA H&N - Up to 50% carotid siphon narrowing bilaterally.  Distal basilar and left P1 moderate stenosis.  2D Echo - unremarkable  LE venous Doppler - negative for DVT  LDL - 76  HgbA1c -  5.3  Hypercoagulable and autoimmune work-up pending  Consider TEE if neurosyphilis ruled out.  VTE prophylaxis -heparin subq  aspirin 81 mg daily prior to admission, now on aspirin 81 mg daily and clopidogrel 75 mg daily.  Continue DAPT for 3 weeks and then Plavix alone.  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 penicillin on the street, not through health department), but not known syphilis diagnosis  He admitted that he had gonorrhea diagnosis in the past but treated with a penicillin (not sure why gonorrhea was treated by penicillin)  May consider to involve ID  Positive ANA  ANA positive  ESR CRP negative  Autoimmune work-up pending  Hypertension  Stable . Permissive hypertension (OK if < 220/120) but gradually normalize in 5-7 days . Long-term BP goal normotensive  Hyperlipidemia  Lipid lowering medication PTA: Lipitor 40 mg daily  LDL 76, goal < 70  Current lipid lowering medication: Lipitor 40 mg daily  Continue statin at discharge   Other Stroke Risk Factors  Advanced age  Former cigarette smoker - quit  ETOH use, advised to drink no more than 1 alcoholic beverage per day.  Obesity, Body mass index is 43.23 kg/m., recommend weight loss, diet and exercise as appropriate   Previous stroke imaging  Other Active Problems  Elevated creatinine, 1.66-1.39-1.12   Hospital day # 4  Personally examined patient and images, and have participated in and made any corrections  needed to history, physical, neuro exam,assessment and plan as stated above.  I have personally obtained the history, evaluated lab date, reviewed imaging studies and agree with radiology interpretations.    Sarina Ill, MD Stroke Neurology    To contact Stroke Continuity provider, please refer to [email or web address] After hours, contact General Neurology

## 2018-02-14 NOTE — Progress Notes (Signed)
VASCULAR LAB PRELIMINARY  PRELIMINARY  PRELIMINARY  PRELIMINARY  Bilateral lower extremity venous duplex completed.    Preliminary report:  There is no DVT or SVT noted in the bilateral lower extremities.   Irva Loser, RVT 02/14/2018, 9:34 AM

## 2018-02-14 NOTE — Progress Notes (Signed)
      INFECTIOUS DISEASE ATTENDING ADDENDUM:   Date: 02/14/2018  Patient name: Robert Donovan  Medical record number: [id number]  Date of birth: 12/18/1955   CSF profile to date has been reviewed.    His CSF does not have any leukocytosis that would give him a diagnosis of neurosyphilis.  VDRL is pending on CSF  He DOES have elevated protein which can be due to NS, I have ordered a CSF FTA per CDC guidelines in this situation of RPR with elevated serum protein, but negative white blood cells  Personally have never seen the patient with this low titer of RPR with neurosyphilis but there are syphilis experts who HAVE.  Dr. Ninetta LightsHatcher to followup tomorrow.    Paulette BlanchCornelius Van Dam 02/14/2018, 11:40 AM

## 2018-02-14 NOTE — Progress Notes (Signed)
PROGRESS NOTE [facility]   Parag Dorton   ZOX:[id number] DOB: [birth year]  DOA: 02/10/2018 PCP: Loletta Specter, PA-C   Brief Narrative:  Patient is a 62 year old male with medical history significant for hypertension, hyperlipidemia, CVA in the past, obesity and osteoarthritis who presented to the emergency department after being seen by PCP for positive blood test of syphilis during his last evaluation.  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  He also reported over the past 4 to 5 months he has progressively declined with slurred speech, right foot dragging and inability to lift up, multiple falls and confusion started time.  Upon ED evaluation, CT of the head without contrast revealed focal hypodensity of the left parasagittal frontal lobe in the subcortical white matter which was new when compared to previous images from April.  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  Neurology input is highly appreciated.  Subjective: Patient seen. No new changes.    Assessment & Plan: Acute metabolic encephalopathy Work-up has revealed acute CVA.   Conflicting history of possible prior syphilis infection.  Patient may have had gonorrhea.     Encephalopathy has resolved  Acute CVA MRI Brain: Multiple areas of acute infarct in the left anterior cerebral artery and left middle cerebral artery territory suggesting emboli.  Multiple areas of chronic microhemorrhage in the brain. NIH score 7.  Neurology has been consulted recommended transfer to [facility] for further work-up.  Echocardiogram with bubble study has been ordered to rule out PFO.  Patient on aspirin and Lipitor.  Will check A1c, lipid panel and update PT and OT consult.  Follow neurology recommendations.  New neurochecks and fall precautions. 02/14/2018: Patient is currently on aspirin, Plavix and Lipitor.  Neurology is  directing care.  Physical therapy input is appreciated. For TEE  AKI, resolved: Likely prerenal and continuation of diuretics.  Creatinine on admission 1.66 Creatinine has improved after IV hydration.  Continue IV fluids for now.  Avoid nephrotoxic agent and check renal function in a.m. 02/13/2018: Serum creatinine today is 1.12.  Essentially, AKI has resolved.    Positive syphilis results Patient with contradicting stories regarding having syphilis in the past versus gonorrhea. However current lab work-up shows ANA, T palladium ABS and RPR positive with quantitative of 1:2, this also could be contributing to acute metabolic encephalopathy. LP has been ordered, will need to hold Lovenox tonight.  Case discussed with ID and will see in consultation.  Recommending to hold on antibiotic until definitive diagnosis has been established.  Infectious disease input is appreciated.  Follow CSF analysis.  Hep A vaccine prior to discharge.   Work-up is in progress.  Essential hypertension Slight elevated BP, will allow permissive hypertension.  Continue to hold lisinopril and hydrochlorothiazide in setting of AKI. Monitor BP closely 02/13/2018: BP is improving slowly.    Homeless Social work consulted.  DVT prophylaxis: Lovenox - hold tonight for LP  Code Status: Full Code  Family Communication: None at bedside  Disposition Plan: Transfer to [facility] hospital for further evaluation and treatment.   Consultants:   ID  Neurology   Procedures:   None   Antimicrobials:  None     Objective: Vitals:   02/13/18 1956 02/13/18 2320 02/14/18 0441 02/14/18 1148  BP: (!) 181/82 137/66 (!) 151/77 (!) 172/86  Pulse: 79 68 77 71  Resp:  18 18 18   Temp: 98.4  F (36.9 C) 98.5 F (36.9 C) 98.4 F (36.9 C) 98.5 F (36.9 C)  TempSrc: Oral Oral Oral Oral  SpO2: 99% 97% 98% 94%  Weight:      Height:       No intake or output data in the 24 hours ending 02/14/18 1525 Filed Weights   02/11/18 2033    Weight: 132.8 kg    Examination:  General exam: NAD, slurred speech  HEENT: OP clear, poor dentition  Respiratory system: Clear to auscultation. No wheezes,crackle or rhonchi Cardiovascular system: S1 & S2 heard. Gastrointestinal system: Abdomen is obese, soft and nontender.  Organs are difficult to assess.  Central nervous system: Alert and oriented., right lower extremity is 1 to 1+/5.  Extremities: Trace B/L LE edema. Skin: No rashes Psychiatry: Mood & affect appropriate.    Data Reviewed: I have personally reviewed following labs and imaging studies  CBC: Recent Labs  Lab 02/08/18 1506 02/10/18 1722 02/11/18 0345  WBC 10.5 10.5 10.4  NEUTROABS 6.9 6.6 6.3  HGB 15.3 16.2 14.4  HCT 47.5 48.6 44.4  MCV 88 87.1 87.7  PLT 253 247 206   Basic Metabolic Panel: Recent Labs  Lab 02/08/18 1506 02/10/18 1722 02/11/18 0345 02/13/18 0745  NA 143 141 141 139  K 4.0 3.5 3.3* 3.8  CL 104 106 106 110  CO2 22 24 22  21*  GLUCOSE 76 108* 127* 102*  BUN 15 22* 20 11  CREATININE 1.34* 1.66* 1.39* 1.12  CALCIUM 9.8 9.9 9.1 8.8*   GFR: Estimated Creatinine Clearance: 94.7 mL/min (by C-G formula based on SCr of 1.12 mg/dL). Liver Function Tests: Recent Labs  Lab 02/08/18 1506  AST 21  ALT 12  ALKPHOS 102  BILITOT 1.0  PROT 7.2  ALBUMIN 4.4   No results for input(s): LIPASE, AMYLASE in the last 168 hours. No results for input(s): AMMONIA in the last 168 hours. Coagulation Profile: No results for input(s): INR, PROTIME in the last 168 hours. Cardiac Enzymes: No results for input(s): CKTOTAL, CKMB, CKMBINDEX, TROPONINI in the last 168 hours. BNP (last 3 results) No results for input(s): PROBNP in the last 8760 hours. HbA1C: No results for input(s): HGBA1C in the last 72 hours. CBG: No results for input(s): GLUCAP in the last 168 hours. Lipid Profile: No results for input(s): CHOL, HDL, LDLCALC, TRIG, CHOLHDL, LDLDIRECT in the last 72 hours. Thyroid Function  Tests: No results for input(s): TSH, T4TOTAL, FREET4, T3FREE, THYROIDAB in the last 72 hours. Anemia Panel: No results for input(s): VITAMINB12, FOLATE, FERRITIN, TIBC, IRON, RETICCTPCT in the last 72 hours. Sepsis Labs: No results for input(s): PROCALCITON, LATICACIDVEN in the last 168 hours.  Recent Results (from the past 240 hour(s))  CSF culture     Status: None (Preliminary result)   Collection Time: 02/12/18  4:17 PM  Result Value Ref Range Status   Specimen Description CSF  Final   Special Requests NONE  Final   Gram Stain NO WBC SEEN NO ORGANISMS SEEN CYTOSPIN SMEAR   Final   Culture   Final    NO GROWTH 2 DAYS Performed at [facility], [address] [place], [place] [address]    Report Status PENDING  Incomplete      Radiology Studies: Ct Angio Head W Or Wo Contrast  Result Date: 02/13/2018 CLINICAL DATA:  Stroke follow-up EXAM: CT ANGIOGRAPHY HEAD AND NECK TECHNIQUE: Multidetector CT imaging of the head and neck was performed using the standard protocol during bolus administration of  intravenous contrast. Multiplanar CT image reconstructions and MIPs were obtained to evaluate the vascular anatomy. Carotid stenosis measurements (when applicable) are obtained utilizing NASCET criteria, using the distal internal carotid diameter as the denominator. CONTRAST:  50mL ISOVUE-370 IOPAMIDOL (ISOVUE-370) INJECTION 76% COMPARISON:  Brain MRI from 2 days ago FINDINGS: CTA NECK FINDINGS Aortic arch: 2 vessel branching pattern. Right carotid system: Diffuse atheromatous wall thickening of the common carotid and proximal ICA, noncalcified. No focal or flow limiting stenosis. No ulceration or beading. Left carotid system: Diffuse atheromatous wall thickening of the common carotid and proximal ICA. No focal or flow limiting stenosis. No ulceration or beading. Vertebral arteries: Proximal subclavian plaque on the left most notably. The dominant right vertebral artery is widely patent to  the dura. No flow is seen in the left vertebral artery to the distal V2 segment where there is progressive density that is likely from retrograde flow. Skeleton: Degenerative changes without acute or aggressive finding. Prominent periapical erosion about the left lower terminal molar. Other neck: No evidence of mass or inflammation. Upper chest: Negative Review of the MIP images confirms the above findings CTA HEAD FINDINGS Anterior circulation: Carotid siphon atherosclerotic calcification with up to 50% stenosis at the paraclinoid segment on the right. A similar degree of stenosis likely present at the left anterior genu, but limited by calcified plaque blooming and artifact from the skull base. Hypoplastic right A1 segment. Evaluation of medium size vessels is complicated by neighboring opacified venous structures. No branch occlusion, beading, or flow limiting stenosis suspected. Posterior circulation: Dominant right vertebral artery as noted above. There is likely retrograde flow into the left vertebral, with patent left PICA. Left PICA and right AICA are dominant. Moderate narrowing of the distal basilar. Moderate narrowing at the left P1 segment. Venous sinuses: Patent Anatomic variants: As above Delayed phase: No abnormal intracranial enhancement. Review of the MIP images confirms the above findings IMPRESSION: 1. No emergent finding to explain left cerebral infarcts. 2. The non dominant left vertebral artery is non-opacified in the lower neck with distal reconstitution or retrograde flow. 3. Moderate distal basilar and left P1 segment stenosis. 4. Up to 50% carotid siphon narrowing bilaterally. Electronically Signed   By: Marnee Spring M.D.   On: 02/13/2018 13:08   Ct Angio Neck W Or Wo Contrast  Result Date: 02/13/2018 CLINICAL DATA:  Stroke follow-up EXAM: CT ANGIOGRAPHY HEAD AND NECK TECHNIQUE: Multidetector CT imaging of the head and neck was performed using the standard protocol during bolus  administration of intravenous contrast. Multiplanar CT image reconstructions and MIPs were obtained to evaluate the vascular anatomy. Carotid stenosis measurements (when applicable) are obtained utilizing NASCET criteria, using the distal internal carotid diameter as the denominator. CONTRAST:  50mL ISOVUE-370 IOPAMIDOL (ISOVUE-370) INJECTION 76% COMPARISON:  Brain MRI from 2 days ago FINDINGS: CTA NECK FINDINGS Aortic arch: 2 vessel branching pattern. Right carotid system: Diffuse atheromatous wall thickening of the common carotid and proximal ICA, noncalcified. No focal or flow limiting stenosis. No ulceration or beading. Left carotid system: Diffuse atheromatous wall thickening of the common carotid and proximal ICA. No focal or flow limiting stenosis. No ulceration or beading. Vertebral arteries: Proximal subclavian plaque on the left most notably. The dominant right vertebral artery is widely patent to the dura. No flow is seen in the left vertebral artery to the distal V2 segment where there is progressive density that is likely from retrograde flow. Skeleton: Degenerative changes without acute or aggressive finding. Prominent periapical erosion about the left  lower terminal molar. Other neck: No evidence of mass or inflammation. Upper chest: Negative Review of the MIP images confirms the above findings CTA HEAD FINDINGS Anterior circulation: Carotid siphon atherosclerotic calcification with up to 50% stenosis at the paraclinoid segment on the right. A similar degree of stenosis likely present at the left anterior genu, but limited by calcified plaque blooming and artifact from the skull base. Hypoplastic right A1 segment. Evaluation of medium size vessels is complicated by neighboring opacified venous structures. No branch occlusion, beading, or flow limiting stenosis suspected. Posterior circulation: Dominant right vertebral artery as noted above. There is likely retrograde flow into the left vertebral, with  patent left PICA. Left PICA and right AICA are dominant. Moderate narrowing of the distal basilar. Moderate narrowing at the left P1 segment. Venous sinuses: Patent Anatomic variants: As above Delayed phase: No abnormal intracranial enhancement. Review of the MIP images confirms the above findings IMPRESSION: 1. No emergent finding to explain left cerebral infarcts. 2. The non dominant left vertebral artery is non-opacified in the lower neck with distal reconstitution or retrograde flow. 3. Moderate distal basilar and left P1 segment stenosis. 4. Up to 50% carotid siphon narrowing bilaterally. Electronically Signed   By: Marnee Spring M.D.   On: 02/13/2018 13:08   Dg Fluoro Guided Loc Of Needle/cath Tip For Spinal Inject Lt  Result Date: 02/12/2018 CLINICAL DATA:  Acute encephalopathy EXAM: DIAGNOSTIC LUMBAR PUNCTURE UNDER FLUOROSCOPIC GUIDANCE FLUOROSCOPY TIME:  Fluoroscopy Time:  1 minutes 12 second Radiation Exposure Index (if provided by the fluoroscopic device): Number of Acquired Spot Images: 0 PROCEDURE: Informed consent was obtained from the patient prior to the procedure, including potential complications of headache, allergy, and pain. With the patient prone, the lower back was prepped with Betadine. 1% Lidocaine was used for local anesthesia. Lumbar puncture was performed at the L2-3 level using a 20 gauge needle with return of clear CSF with an opening pressure of 20 cm water. 8 ml of CSF were obtained for laboratory studies. The patient tolerated the procedure well and there were no apparent complications. IMPRESSION: Successful lumbar puncture using fluoroscopy. Electronically Signed   By: Marlan Palau M.D.   On: 02/12/2018 16:48      Scheduled Meds: .  stroke: mapping our early stages of recovery book   Does not apply Once  . aspirin EC  81 mg Oral Daily  . atorvastatin  40 mg Oral Daily  . clopidogrel  75 mg Oral Daily  . heparin injection (subcutaneous)  5,000 Units Subcutaneous  Q8H  . senna-docusate  1 tablet Oral BID   Continuous Infusions:    LOS: 4 days    Time spent: 25 minutes.     Barnetta Chapel, MD Pager: Text Page via [email or web address]   If 7PM-7AM, please contact night-coverage [email or web address] 02/14/2018, 3:24 PM

## 2018-02-15 ENCOUNTER — Encounter (HOSPITAL_COMMUNITY): Payer: Self-pay | Admitting: Physical Medicine and Rehabilitation

## 2018-02-15 DIAGNOSIS — M159 Polyosteoarthritis, unspecified: Secondary | ICD-10-CM

## 2018-02-15 DIAGNOSIS — I639 Cerebral infarction, unspecified: Secondary | ICD-10-CM

## 2018-02-15 DIAGNOSIS — I6340 Cerebral infarction due to embolism of unspecified cerebral artery: Secondary | ICD-10-CM

## 2018-02-15 DIAGNOSIS — Z8619 Personal history of other infectious and parasitic diseases: Secondary | ICD-10-CM

## 2018-02-15 DIAGNOSIS — I10 Essential (primary) hypertension: Secondary | ICD-10-CM

## 2018-02-15 DIAGNOSIS — E876 Hypokalemia: Secondary | ICD-10-CM

## 2018-02-15 LAB — ANTI-SMITH ANTIBODY

## 2018-02-15 LAB — BASIC METABOLIC PANEL
Anion gap: 9 (ref 5–15)
BUN: 16 mg/dL (ref 6–20)
CALCIUM: 8.9 mg/dL (ref 8.9–10.3)
CO2: 23 mmol/L (ref 22–32)
Chloride: 107 mmol/L (ref 98–111)
Creatinine, Ser: 1.35 mg/dL — ABNORMAL HIGH (ref 0.61–1.24)
GFR calc Af Amer: >60 mL/min (ref >60)
GFR, EST NON AFRICAN AMERICAN: 56 mL/min — AB (ref >60)
GLUCOSE: 127 mg/dL — AB (ref 70–99)
POTASSIUM: 4 mmol/L (ref 3.5–5.1)
SODIUM: 139 mmol/L (ref 135–145)

## 2018-02-15 LAB — RAPID URINE DRUG SCREEN, HOSP PERFORMED
Amphetamines: NOT DETECTED
BENZODIAZEPINES: NOT DETECTED
Barbiturates: NOT DETECTED
COCAINE: NOT DETECTED
OPIATES: POSITIVE — AB
TETRAHYDROCANNABINOL: NOT DETECTED

## 2018-02-15 LAB — CARDIOLIPIN ANTIBODIES, IGG, IGM, IGA
Anticardiolipin IgA: <9 APL U/mL (ref 0–11)
Anticardiolipin IgG: <9 GPL U/mL (ref 0–14)
Anticardiolipin IgM: <9 MPL U/mL (ref 0–12)

## 2018-02-15 LAB — VDRL, CSF: SYPHILIS VDRL QUANT CSF: NONREACTIVE

## 2018-02-15 LAB — ANTI-DNA ANTIBODY, DOUBLE-STRANDED: ds DNA Ab: <1 IU/mL (ref 0–9)

## 2018-02-15 LAB — SJOGRENS SYNDROME-A EXTRACTABLE NUCLEAR ANTIBODY

## 2018-02-15 LAB — PROTIME-INR
INR: 1.03
PROTHROMBIN TIME: 13.4 s (ref 11.4–15.2)

## 2018-02-15 LAB — HOMOCYSTEINE: Homocysteine: 19.8 umol/L — ABNORMAL HIGH (ref 0.0–15.0)

## 2018-02-15 LAB — SJOGRENS SYNDROME-B EXTRACTABLE NUCLEAR ANTIBODY

## 2018-02-15 MED ORDER — SODIUM CHLORIDE 0.9 % IV SOLN
INTRAVENOUS | Status: DC
  Administered 2018-02-15: 21:00:00 via INTRAVENOUS
  Administered 2018-02-16: 500 mL via INTRAVENOUS

## 2018-02-15 NOTE — Progress Notes (Signed)
Physical Therapy Treatment Patient Details Name: Robert Donovan MRN: [id number] DOB: 05/11/1956 Today's Date: 02/15/2018    History of Present Illness Robert Donovan is a [age] y.o. male with medical history significant of HTN, HLD, CVA, obesity, and osteoarthritis. Reports progressive decline of the last 4-5 months, including slurred speech and R sided weakness with 2 recent falls.  MRI noted with many micro strokes L ACA and L MCA with areas of micro hemorrhage. Plan for LP to rule out conribution of syphilis. PMH signficant for: arthritis, HTN.     PT Comments    Patient seen for activity progression. Session focused on transfer training and ambulation/gait retraining activities. Patient tolerated session well. Improving functional tolerance and showing improvements in RLE activation. Current POC remains appropriate. Will continue to see and progress as tolerated.  Follow Up Recommendations  CIR     Equipment Recommendations  Rolling walker with 5" wheels    Recommendations for Other Services       Precautions / Restrictions Precautions Precautions: Fall Restrictions Weight Bearing Restrictions: No    Mobility  Bed Mobility Overal bed mobility: Needs Assistance Bed Mobility: Rolling;Sidelying to Sit Rolling: Mod assist Sidelying to sit: Mod assist       General bed mobility comments: Received up in chair with OT  Transfers Overall transfer level: Needs assistance Equipment used: Rolling walker (2 wheeled) Transfers: Sit to/from Stand Sit to Stand: Mod assist         General transfer comment: Heavy mod assist to power up to standing at EOB with min assist to take pivotal steps toward chair.   Ambulation/Gait Ambulation/Gait assistance: Min assist Gait Distance (Feet): 60 Feet(x4 with extended rest breaks between trials) Assistive device: Rolling walker (2 wheeled) Gait Pattern/deviations: Trunk flexed;Step-through pattern;Wide base of support;Decreased stride  length Gait velocity: decreased Gait velocity interpretation: <1.31 ft/sec, indicative of household ambulator General Gait Details: heavy reliance on RW with increased trunk flexion, R knee held in extension during swing phase with pt demonstrating wide BOS to accommodate.    Stairs             Wheelchair Mobility    Modified Rankin (Stroke Patients Only) Modified Rankin (Stroke Patients Only) Pre-Morbid Rankin Score: No symptoms Modified Rankin: Moderately severe disability     Balance Overall balance assessment: Needs assistance Sitting-balance support: No upper extremity supported;Feet supported Sitting balance-Leahy Scale: Fair     Standing balance support: Bilateral upper extremity supported;During functional activity Standing balance-Leahy Scale: Poor Standing balance comment: heavy reliance on RW                            Cognition Arousal/Alertness: Awake/alert Behavior During Therapy: WFL for tasks assessed/performed Overall Cognitive Status: No family/caregiver present to determine baseline cognitive functioning Area of Impairment: Memory;Following commands;Problem solving                     Memory: Decreased short-term memory Following Commands: Follows multi-step commands inconsistently     Problem Solving: Slow processing;Decreased initiation;Difficulty sequencing General Comments: Pt able to answer questions well today but difficulty sequencing and following multi-step commands.       Exercises Other Exercises Other Exercises: sit <> stand trials with cues for positioning and momentum  Other Exercises: Gait retraining trails with facilitation for weight shift Other Exercises: Functional reaching tasks this session seated at EOB.     General Comments        Pertinent Vitals/Pain Pain  Assessment: No/denies pain    Home Living                      Prior Function            PT Goals (current goals can now be  found in the care plan section) Acute Rehab PT Goals Patient Stated Goal: to get stronger PT Goal Formulation: With patient Time For Goal Achievement: 02/26/18 Potential to Achieve Goals: Good Progress towards PT goals: Progressing toward goals    Frequency    Min 4X/week      PT Plan Current plan remains appropriate    Co-evaluation              AM-PAC PT "6 Clicks" Daily Activity  Outcome Measure  Difficulty turning over in bed (including adjusting bedclothes, sheets and blankets)?: A Lot Difficulty moving from lying on back to sitting on the side of the bed? : Unable Difficulty sitting down on and standing up from a chair with arms (e.g., wheelchair, bedside commode, etc,.)?: Unable Help needed moving to and from a bed to chair (including a wheelchair)?: A Lot Help needed walking in hospital room?: A Little Help needed climbing 3-5 steps with a railing? : Total 6 Click Score: 10    End of Session         PT Visit Diagnosis: Other abnormalities of gait and mobility (R26.89);Difficulty in walking, not elsewhere classified (R26.2);Pain Pain - Right/Left: Right Pain - part of body: Knee     Time: 1610-9604 PT Time Calculation (min) (ACUTE ONLY): 33 min  Charges:  $Gait Training: 8-22 mins $Therapeutic Activity: 8-22 mins                     Charlotte Crumb, PT DPT  Board Certified Neurologic Specialist Acute Rehabilitation Services Pager [phone] Office [phone]    Fabio Asa 02/15/2018, 10:47 AM

## 2018-02-15 NOTE — Progress Notes (Signed)
Occupational Therapy Treatment Patient Details Name: Robert Donovan MRN: [id number] DOB: [birth year] Today's Date: 02/15/2018    History of present illness Robert Donovan is a 62 y.o. male with medical history significant of HTN, HLD, CVA, obesity, and osteoarthritis. Reports progressive decline of the last 4-5 months, including slurred speech and R sided weakness with 2 recent falls.  MRI noted with many micro strokes L ACA and L MCA with areas of micro hemorrhage. Plan for LP to rule out conribution of syphilis. PMH signficant for: arthritis, HTN.    OT comments  Pt demonstrating good progress toward OT goals this session. He was able to participate in multiple functional RUE activities seated at EOB this session in preparation for ADL independence. Pt able to complete oral care with set-up, supervision, and increased time to brush his teeth. Noted decreased in-hand manipulation skills and pt requiring cues to engage in tasks with RUE today. He was able to complete simulated toilet transfer today with mod assist. Pt noted to be denied CIR placement and thus will need SNF placement for rehabilitation.    Follow Up Recommendations  CIR;Supervision/Assistance - 24 hour;SNF;Other (comment)(noted not approved for CIR)    Equipment Recommendations  Other (comment)(TBD at next venue of care)    Recommendations for Other Services Rehab consult;PT consult    Precautions / Restrictions Precautions Precautions: Fall Restrictions Weight Bearing Restrictions: No       Mobility Bed Mobility Overal bed mobility: Needs Assistance Bed Mobility: Rolling;Sidelying to Sit Rolling: Mod assist Sidelying to sit: Mod assist       General bed mobility comments: Requires cues to engage R UE to power up from [facility].   Transfers Overall transfer level: Needs assistance Equipment used: Rolling walker (2 wheeled) Transfers: Sit to/from Stand Sit to Stand: Mod assist         General transfer comment:  Heavy mod assist to power up to standing at EOB with min assist to take pivotal steps toward chair.     Balance Overall balance assessment: Needs assistance Sitting-balance support: No upper extremity supported;Feet supported Sitting balance-Leahy Scale: Fair     Standing balance support: Bilateral upper extremity supported;During functional activity Standing balance-Leahy Scale: Poor Standing balance comment: heavy reliance on RW                           ADL either performed or assessed with clinical judgement   ADL Overall ADL's : Needs assistance/impaired     Grooming: Supervision/safety;Sitting;Oral care Grooming Details (indicate cue type and reason): Increased time and difficulty with in-hand manipulation tasks.                  Toilet Transfer: Moderate assistance;RW Toilet Transfer Details (indicate cue type and reason): simulated with stand-pivot from bed to recliner         Functional mobility during ADLs: Moderate assistance;Rolling walker General ADL Comments: Pt participating in functional RUE strengthening activities as well as ADL tasks seated at EOB this session.      Vision   Additional Comments: Vision functional for tasks assessed this session.    Perception     Praxis      Cognition Arousal/Alertness: Awake/alert Behavior During Therapy: WFL for tasks assessed/performed Overall Cognitive Status: No family/caregiver present to determine baseline cognitive functioning Area of Impairment: Memory;Following commands;Problem solving                     Memory: Decreased short-term  memory Following Commands: Follows multi-step commands inconsistently     Problem Solving: Slow processing;Decreased initiation;Difficulty sequencing General Comments: Pt able to answer questions well today but difficulty sequencing and following multi-step commands.         Exercises Exercises: Other exercises Other Exercises Other Exercises:  Weight bearing through RUE with pressing up to upright position x5 Other Exercises: PNF patterns AROM with RUE seated at EOB x5 each. Other Exercises: Functional reaching tasks this session seated at EOB.    Shoulder Instructions       General Comments      Pertinent Vitals/ Pain       Pain Assessment: No/denies pain  Home Living                                          Prior Functioning/Environment              Frequency  Min 2X/week        Progress Toward Goals  OT Goals(current goals can now be found in the care plan section)  Progress towards OT goals: Progressing toward goals  Acute Rehab OT Goals Patient Stated Goal: to get stronger OT Goal Formulation: With patient Time For Goal Achievement: 02/26/18 Potential to Achieve Goals: Good  Plan Discharge plan remains appropriate    Co-evaluation                 AM-PAC PT "6 Clicks" Daily Activity     Outcome Measure   Help from another person eating meals?: None(NPO for procedure today) Help from another person taking care of personal grooming?: A Little Help from another person toileting, which includes using toliet, bedpan, or urinal?: A Lot Help from another person bathing (including washing, rinsing, drying)?: A Lot Help from another person to put on and taking off regular upper body clothing?: A Little Help from another person to put on and taking off regular lower body clothing?: A Lot 6 Click Score: 16    End of Session Equipment Utilized During Treatment: Gait belt;Rolling walker  OT Visit Diagnosis: Other abnormalities of gait and mobility (R26.89);Unsteadiness on feet (R26.81);Muscle weakness (generalized) (M62.81);History of falling (Z91.81);Hemiplegia and hemiparesis Hemiplegia - Right/Left: Right Hemiplegia - dominant/non-dominant: Non-Dominant Hemiplegia - caused by: Cerebral infarction   Activity Tolerance Patient tolerated treatment well   Patient Left in  chair(Pt working with PT in room)   Nurse Communication Mobility status        Time: 1610-9604 OT Time Calculation (min): 21 min  Charges: OT General Charges $OT Visit: 1 Visit OT Treatments $Self Care/Home Management : 8-22 mins  Robert Donovan, OTR/L Acute Rehabilitation Services Pager [phone] Office [phone]    [facility] A Hadley Detloff 02/15/2018, 10:03 AM

## 2018-02-15 NOTE — Progress Notes (Signed)
    [facility] has been requested to perform a transesophageal echocardiogram on Robert Donovan for CVA.  After careful review of history and examination, the risks and benefits of transesophageal echocardiogram have been explained including risks of esophageal damage, perforation (1:10,000 risk), bleeding, pharyngeal hematoma as well as other potential complications associated with conscious sedation including aspiration, arrhythmia, respiratory failure and death. Alternatives to treatment were discussed, questions were answered. Patient is willing to proceed.   Robert ChardJill Johnnisha Forton, NP  02/15/2018 3:59 PM

## 2018-02-15 NOTE — Progress Notes (Signed)
PROGRESS NOTE [facility]   Syler Norcia   ZOX:[id number] DOB: 1956-03-01  DOA: 02/10/2018 PCP: Loletta Specter, PA-C   Brief Narrative:  Patient is a 62 year old male with medical history significant for hypertension, hyperlipidemia, CVA in the past, obesity and osteoarthritis who presented to the emergency department after being seen by PCP for positive blood test of syphilis during his last evaluation.  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  He also reported over the past 4 to 5 months he has progressively declined with slurred speech, right foot dragging and inability to lift up, multiple falls and confusion started time.  Upon ED evaluation, CT of the head without contrast revealed focal hypodensity of the left parasagittal frontal lobe in the subcortical white matter which was new when compared to previous images from April.  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  Neurology input is highly appreciated.  02/15/2018: For TEE in the morning.  Subjective: Patient seen. No new changes. Right lower extremity weakness persists.  Assessment & Plan: Acute metabolic encephalopathy Work-up has revealed acute CVA.   Conflicting history of possible prior syphilis infection.  Patient may have had gonorrhea.     Encephalopathy has resolved  Acute CVA MRI Brain: Multiple areas of acute infarct in the left anterior cerebral artery and left middle cerebral artery territory suggesting emboli.  Multiple areas of chronic microhemorrhage in the brain. NIH score 7.  Neurology has been consulted recommended transfer to [facility] for further work-up.  Echocardiogram with bubble study has been ordered to rule out PFO.  Patient on aspirin and Lipitor.  Will check A1c, lipid panel and update PT and OT consult.  Follow neurology recommendations.  New neurochecks and fall  precautions. 02/14/2018: Patient is currently on aspirin, Plavix and Lipitor.  Neurology is directing care.  Physical therapy input is appreciated. For TEE in the morning  AKI, resolved: Likely prerenal and continuation of diuretics.  Creatinine on admission 1.66 Creatinine has improved after IV hydration.  Continue IV fluids for now.  Avoid nephrotoxic agent and check renal function in a.m. 02/13/2018: Serum creatinine today is 1.12.  Essentially, AKI has resolved.    Positive syphilis results Patient with contradicting stories regarding having syphilis in the past versus gonorrhea. However current lab work-up shows ANA, T palladium ABS and RPR positive with quantitative of 1:2, this also could be contributing to acute metabolic encephalopathy. LP has been ordered, will need to hold Lovenox tonight.  Case discussed with ID and will see in consultation.  Recommending to hold on antibiotic until definitive diagnosis has been established.  Infectious disease input is appreciated.  Follow CSF analysis.  Hep A vaccine prior to discharge.   Work-up is in progress.  Essential hypertension Slight elevated BP, will allow permissive hypertension.  Continue to hold lisinopril and hydrochlorothiazide in setting of AKI. Monitor BP closely 02/13/2018: BP is improving slowly.    Homeless Social work consulted.  DVT prophylaxis: Lovenox - hold tonight for LP  Code Status: Full Code  Family Communication: None at bedside  Disposition Plan: Transfer to [facility] hospital for further evaluation and treatment.   Consultants:   ID  Neurology   Procedures:   None   Antimicrobials:  None     Objective: Vitals:   02/14/18 2355 02/15/18 0306 02/15/18 0740 02/15/18 1237  BP: (!) 147/82 (!) 167/80 (!) 146/74 128/73  Pulse:  67 67 65 100  Resp: 18 18 18 18   Temp: 98.3 F (36.8 C) 98.2 F (36.8 C) 98.3 F (36.8 C) (!) 97.4 F (36.3 C)  TempSrc: Oral Oral Oral Oral  SpO2: 99% 98% 100% 99%  Weight:       Height:        Intake/Output Summary (Last 24 hours) at 02/15/2018 1603 Last data filed at 02/15/2018 1400 Gross per 24 hour  Intake -  Output 125 ml  Net -125 ml   Filed Weights   02/11/18 2033  Weight: 132.8 kg    Examination:  General exam: NAD, slurred speech  HEENT: OP clear, poor dentition  Respiratory system: Clear to auscultation. No wheezes,crackle or rhonchi Cardiovascular system: S1 & S2 heard. Gastrointestinal system: Abdomen is obese, soft and nontender.  Organs are difficult to assess.  Central nervous system: Alert and oriented., right lower extremity is 1 to 1+/5.  Extremities: Trace B/L LE edema. Skin: No rashes Psychiatry: Mood & affect appropriate.    Data Reviewed: I have personally reviewed following labs and imaging studies  CBC: Recent Labs  Lab 02/10/18 1722 02/11/18 0345  WBC 10.5 10.4  NEUTROABS 6.6 6.3  HGB 16.2 14.4  HCT 48.6 44.4  MCV 87.1 87.7  PLT 247 206   Basic Metabolic Panel: Recent Labs  Lab 02/10/18 1722 02/11/18 0345 02/13/18 0745  NA 141 141 139  K 3.5 3.3* 3.8  CL 106 106 110  CO2 24 22 21*  GLUCOSE 108* 127* 102*  BUN 22* 20 11  CREATININE 1.66* 1.39* 1.12  CALCIUM 9.9 9.1 8.8*   GFR: Estimated Creatinine Clearance: 94.7 mL/min (by C-G formula based on SCr of 1.12 mg/dL). Liver Function Tests: No results for input(s): AST, ALT, ALKPHOS, BILITOT, PROT, ALBUMIN in the last 168 hours. No results for input(s): LIPASE, AMYLASE in the last 168 hours. No results for input(s): AMMONIA in the last 168 hours. Coagulation Profile: No results for input(s): INR, PROTIME in the last 168 hours. Cardiac Enzymes: No results for input(s): CKTOTAL, CKMB, CKMBINDEX, TROPONINI in the last 168 hours. BNP (last 3 results) No results for input(s): PROBNP in the last 8760 hours. HbA1C: No results for input(s): HGBA1C in the last 72 hours. CBG: No results for input(s): GLUCAP in the last 168 hours. Lipid Profile: No results  for input(s): CHOL, HDL, LDLCALC, TRIG, CHOLHDL, LDLDIRECT in the last 72 hours. Thyroid Function Tests: No results for input(s): TSH, T4TOTAL, FREET4, T3FREE, THYROIDAB in the last 72 hours. Anemia Panel: No results for input(s): VITAMINB12, FOLATE, FERRITIN, TIBC, IRON, RETICCTPCT in the last 72 hours. Sepsis Labs: No results for input(s): PROCALCITON, LATICACIDVEN in the last 168 hours.  Recent Results (from the past 240 hour(s))  CSF culture     Status: None (Preliminary result)   Collection Time: 02/12/18  4:17 PM  Result Value Ref Range Status   Specimen Description CSF  Final   Special Requests NONE  Final   Gram Stain NO WBC SEEN NO ORGANISMS SEEN CYTOSPIN SMEAR   Final   Culture   Final    NO GROWTH 3 DAYS Performed at [facility], [address] [place], [place] [address]    Report Status PENDING  Incomplete      Radiology Studies: No results found.    Scheduled Meds: .  stroke: mapping our early stages of recovery book   Does not apply Once  . aspirin EC  81 mg Oral Daily  . atorvastatin  40 mg Oral Daily  . clopidogrel  75 mg Oral Daily  . heparin injection (subcutaneous)  5,000 Units Subcutaneous Q8H  . senna-docusate  1 tablet Oral BID   Continuous Infusions:    LOS: 5 days    Time spent: 25 minutes.     Barnetta Chapel, MD Pager: Text Page via [email or web address]   If 7PM-7AM, please contact night-coverage [email or web address] 02/15/2018, 4:03 PM

## 2018-02-15 NOTE — Clinical Social Work Note (Signed)
Clinical Social Work Assessment  Patient Details  Name: Robert Donovan MRN: [id number] Date of Birth: 1956/05/24  Date of referral:  02/15/18               Reason for consult:  Facility Placement, Discharge Planning                Permission sought to share information with:  Facility Sport and exercise psychologist, Family Supports Permission granted to share information::  Yes, Verbal Permission Granted  Name::     Lawerance Bach  Agency::  SNFs  Relationship::  mother  Contact Information:  [phone]  Housing/Transportation Living arrangements for the past 2 months:  Homeless Source of Information:  Patient Patient Interpreter Needed:  None Criminal Activity/Legal Involvement Pertinent to Current Situation/Hospitalization:  No - Comment as needed Significant Relationships:  Parents Lives with:  Self Do you feel safe going back to the place where you live?  No Need for family participation in patient care:  No (Coment)  Care giving concerns: Patient is homeless and does not have insurance coverage. PT recommending CIR but CIR unable to take patient due to lack of support after discharge. CSW consulting for SNF placement.   Social Worker assessment / plan: CSW met with patient at bedside. Patient alert and oriented, sitting up in bedside chair to eat lunch. CSW introduced self and role and discussed disposition planning - PT recommendation for rehab. Patient reported he is homeless and has been mostly sleeping in his truck. He stays with his mother or his friend sometimes, but neither are able to have patient stay with them currently. Patient explained that his mother is looking for somewhere for him to stay for when he leaves the hospital/SNF.   Patient is agreeable to SNF and hopeful for rehab. Patient reported someone has talked to him about a Medicaid application when he was at [facility] and provided contact information - [facility] [phone]. CSW called Ms. Metzloff and confirmed  patient had been screened for possible Medicaid eligibility, and referred to financial counselor - Arvil Persons [phone]. CSW left message for financial counseling, awaiting call back to determine status of Medicaid application process.  CSW consulted with clinical supervisor regarding LOG for SNF. LOG approved for two weeks pending confirmation that Medicaid application started.   CSW faxed out initial SNF referrals. Awaiting bed offers. CSW to follow and support with disposition planning.  Employment status:  Disabled (Comment on whether or not currently receiving Disability) Insurance information:  Self Pay (Medicaid Pending) PT Recommendations:  [place] / Referral to community resources:  [place]  Patient/Family's Response to care: Patient appreciative of care.  Patient/Family's Understanding of and Emotional Response to Diagnosis, Current Treatment, and Prognosis: Patient with good understanding of his condition and agreeable to SNF if an LOG bed can be identified.  Emotional Assessment Appearance:  Appears stated age Attitude/Demeanor/Rapport:  Engaged Affect (typically observed):  Accepting, Calm, Pleasant, Appropriate Orientation:  Oriented to Self, Oriented to Place, Oriented to  Time, Oriented to Situation Alcohol / Substance use:  Not Applicable Psych involvement (Current and /or in the community):  No (Comment)  Discharge Needs  Concerns to be addressed:  Discharge Planning Concerns, Care Coordination Readmission within the last 30 days:  No Current discharge risk:  Physical Impairment, Homeless Barriers to Discharge:  Homeless with medical needs, Continued Medical Work up, Inadequate or no insurance   Estanislado Emms, LCSW 02/15/2018, 2:26 PM

## 2018-02-15 NOTE — Progress Notes (Signed)
STROKE TEAM PROGRESS NOTE   SUBJECTIVE (INTERVAL HISTORY) No family is at the bedside.  Pt stated that he had Gonorrhea in the past and was treated with penicillin. However, does not know he had syphilis in the past. He still has right leg weakness. LP not consistent with neurosyphilis, but VDRL pending.    OBJECTIVE Vitals:   02/14/18 2355 02/15/18 0306 02/15/18 0740 02/15/18 1237  BP: (!) 147/82 (!) 167/80 (!) 146/74 128/73  Pulse: 67 67 65 100  Resp: '18 18 18 18  ' Temp: 98.3 F (36.8 C) 98.2 F (36.8 C) 98.3 F (36.8 C) (!) 97.4 F (36.3 C)  TempSrc: Oral Oral Oral Oral  SpO2: 99% 98% 100% 99%  Weight:      Height:        CBC:  Recent Labs  Lab 02/10/18 1722 02/11/18 0345  WBC 10.5 10.4  NEUTROABS 6.6 6.3  HGB 16.2 14.4  HCT 48.6 44.4  MCV 87.1 87.7  PLT 247 786    Basic Metabolic Panel:  Recent Labs  Lab 02/11/18 0345 02/13/18 0745  NA 141 139  K 3.3* 3.8  CL 106 110  CO2 22 21*  GLUCOSE 127* 102*  BUN 20 11  CREATININE 1.39* 1.12  CALCIUM 9.1 8.8*    Lipid Panel:     Component Value Date/Time   CHOL 120 02/11/2018 0345   TRIG 82 02/11/2018 0345   HDL 28 (L) 02/11/2018 0345   CHOLHDL 4.3 02/11/2018 0345   VLDL 16 02/11/2018 0345   LDLCALC 76 02/11/2018 0345   HgbA1c:  Lab Results  Component Value Date   HGBA1C 5.3 02/11/2018   Urine Drug Screen:     Component Value Date/Time   LABOPIA POSITIVE (A) 02/15/2018 1130   COCAINSCRNUR NONE DETECTED 02/15/2018 1130   LABBENZ NONE DETECTED 02/15/2018 1130   AMPHETMU NONE DETECTED 02/15/2018 1130   THCU NONE DETECTED 02/15/2018 1130   LABBARB NONE DETECTED 02/15/2018 1130    Alcohol Level No results found for: ETH  IMAGING  Ct Angio Head W Or Wo Contrast Ct Angio Neck W Or Wo Contrast 02/13/2018 IMPRESSION:  1. No emergent finding to explain left cerebral infarcts.  2. The non dominant left vertebral artery is non-opacified in the lower neck with distal reconstitution or retrograde flow.   3. Moderate distal basilar and left P1 segment stenosis.  4. Up to 50% carotid siphon narrowing bilaterally.    Transthoracic Echocardiogram with bubble study - Normal LV systolic function   Grade 1 diastolic dysfunction.   Technically difficult images.   Bilateral LE Dopplers 02/14/2018 Negative for DVT   Ct Head Wo Contrast Result Date: 02/10/2018 CLINICAL DATA:  Altered mental status EXAM: CT HEAD WITHOUT CONTRAST TECHNIQUE: Contiguous axial images were obtained from the base of the skull through the vertex without intravenous contrast. COMPARISON:  09/24/2017 CT FINDINGS: Brain: No acute territorial infarction, hemorrhage or intracranial mass. More focal hypodensity within the left subcortical white matter, new since 09/24/2017 comparison CT. Chronic infarcts within the left thalamus and pons. Moderate small vessel ischemic changes of the white matter. Stable ventricle size. Vascular: No hyperdense vessels.  Carotid vascular calcification Skull: Normal. Negative for fracture or focal lesion. Sinuses/Orbits: No acute finding. Other: None IMPRESSION: 1. Focal hypodensity within the left parasagittal frontal lobe subcortical white matter, which may reflect age indeterminate white matter infarct or possible small focus of edema. This is new since 09/24/2017. 2. Negative for acute hemorrhage. 3. Atrophy with moderate small vessel ischemic changes  of the white matter 4. Chronic lacunar infarcts in the pons and left thalamus. Electronically Signed   By: Donavan Foil M.D.   On: 02/10/2018 18:40   Mr Jeri Cos And Wo Contrast Result Date: 02/11/2018 CLINICAL DATA:  Confusion and able to ambulate. Abnormal CT head. Rule out neuro syphilis. EXAM: MRI HEAD WITHOUT AND WITH CONTRAST TECHNIQUE: Multiplanar, multiecho pulse sequences of the brain and surrounding structures were obtained without and with intravenous contrast. CONTRAST:  10 mL Gadovist IV COMPARISON:  CT head 02/10/2018 FINDINGS: Brain:  Multiple areas of acute infarction. Largest areas over the left frontal convexity in the anterior cerebral artery territory. Small areas of acute infarct in the left parietal periventricular white matter and left posterior temporal white matter. Extensive chronic microvascular ischemic changes throughout the white matter. Numerous chronic lacunar infarctions in the pons bilaterally, thalamus bilaterally and basal ganglia bilaterally. Multiple areas of chronic microhemorrhage in the brain including the left pons, right thalamus, right occipital lobe, left temporal lobe. Negative for mass lesion. Normal enhancement postcontrast administration. Vascular: Normal arterial flow voids Skull and upper cervical spine: Negative Sinuses/Orbits: Mild mucosal edema paranasal sinuses.  Normal orbit Other: None IMPRESSION: Multiple areas of acute infarct in the left anterior cerebral artery and left middle cerebral artery territory suggesting emboli. Advanced chronic ischemic changes as described above. Multiple areas of chronic microhemorrhage in the brain likely due to poorly controlled hypertension. If there is continued concern of neuro syphilis, lumbar puncture suggested. Electronically Signed   By: Franchot Gallo M.D.   On: 02/11/2018 07:57    PHYSICAL EXAM  Temp:  [97.4 F (36.3 C)-98.7 F (37.1 C)] 97.4 F (36.3 C) (09/16 1237) Pulse Rate:  [65-100] 100 (09/16 1237) Resp:  [18] 18 (09/16 1237) BP: (128-170)/(73-91) 128/73 (09/16 1237) SpO2:  [98 %-100 %] 99 % (09/16 1237)  General - obese middle-aged male, in no apparent distress.  Ophthalmologic - fundi not visualized due to noncooperation.  Cardiovascular - Regular rate and rhythm.  Neurological Exam :  Awake alert oriented x 3 normal speech and language. Mild right lower face asymmetry. Tongue midline. No drift. Mild diminished fine finger movements on right Orbits left overright upper extremity. Mild right grip weak.right leg monoplegia with 0/5  strength. Tone is diminished in the right leg.. Normal sensation . Normal coordination.   ASSESSMENT/PLAN Robert Donovan is a 62 y.o. male with history of previous stroke, hyperlipidemia, remote syphilis, and hypertension presenting with slurred speech, falls, and dragging lt foot . He did not receive IV t-PA due to late presentation.  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  MRI head - Multiple areas of acute infarct in the left anterior cerebral artery and left middle cerebral artery territory suggesting emboli.  Chronic bilateral pontine, left thalamic and right ACA punctate infarcts.  CTA H&N - Up to 50% carotid siphon narrowing bilaterally.  Distal basilar and left P1 moderate stenosis.  2D Echo - unremarkable  LE venous Doppler - negative for DVT  LDL - 76  HgbA1c - 5.3  Hypercoagulable and autoimmune work-up pending  Recommend TEE    VTE prophylaxis -heparin subq  aspirin 81 mg daily prior to admission, now on aspirin 81 mg daily and clopidogrel 75 mg daily.  Continue DAPT for 3 weeks  and then Plavix alone.  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 penicillin on the street, not through health department), but not known syphilis diagnosis  He admitted that he had gonorrhea diagnosis in the past but treated with a  penicillin (not sure why gonorrhea was treated by penicillin)     Positive ANA  ANA positive  ESR CRP negative  Autoimmune work-up pending  Hypertension  Stable . Permissive hypertension (OK if < 220/120) but gradually normalize in 5-7 days . Long-term BP goal normotensive  Hyperlipidemia  Lipid lowering medication PTA: Lipitor 40 mg daily  LDL 76, goal < 70  Current lipid lowering medication: Lipitor 40 mg daily  Continue statin at discharge   Other Stroke Risk Factors  Advanced age  Former cigarette smoker - quit  ETOH use, advised to drink no more than 1 alcoholic beverage per day.  Obesity, Body mass index is 43.23 kg/m., recommend weight loss, diet and exercise as appropriate   Previous stroke imaging  Other Active Problems  Elevated creatinine, 1.66-1.39-1.12   Hospital day # 5  Personally examined patient and images, and have participated in and made any corrections needed to history, physical, neuro exam,assessment and plan as stated above.  I have personally obtained the history, evaluated lab date, reviewed imaging studies and agree with radiology interpretations. He has presented with embolic strokes and his positive RPR is in a very low titer and a doubt neurosyphilis can cause embolic strokes and can usually cause basal meningitis and subcortical strokes and opinion. Recommend check TEE and 30 day heart monitor for paroxysmal A. Fib. Discussed with Dr. Marthenia Rolling. Greater than 50% time during this 25 minute visit was spent in counseling and coordination of care about his embolic strokes and answered questions   Antony Contras MD Stroke Neurology    To contact Stroke Continuity provider, please refer to [email or web address] After hours, contact General Neurology

## 2018-02-15 NOTE — Progress Notes (Signed)
INFECTIOUS DISEASE PROGRESS NOTE  ID: Robert Donovan is a 62 y.o. male with  Active Problems:   Acute encephalopathy   CVA (cerebral vascular accident) (HCC)   Acute kidney injury superimposed on chronic kidney disease (HCC)   History of syphilis   Constipation   Osteoarthritis   Benign essential HTN   Acute embolic stroke (HCC)  Subjective: Pt excited that he completed more PT today.   Abtx:  Anti-infectives (From admission, onward)   None      Medications:  Scheduled: .  stroke: mapping our early stages of recovery book   Does not apply Once  . aspirin EC  81 mg Oral Daily  . atorvastatin  40 mg Oral Daily  . clopidogrel  75 mg Oral Daily  . heparin injection (subcutaneous)  5,000 Units Subcutaneous Q8H  . senna-docusate  1 tablet Oral BID    Objective: Vital signs in last 24 hours: Temp:  [97.7 F (36.5 C)-98.7 F (37.1 C)] 98.3 F (36.8 C) (09/16 0740) Pulse Rate:  [65-73] 65 (09/16 0740) Resp:  [18] 18 (09/16 0740) BP: (146-172)/(74-91) 146/74 (09/16 0740) SpO2:  [94 %-100 %] 100 % (09/16 0740)   General appearance: alert, cooperative and no distress Resp: clear to auscultation bilaterally Cardio: regular rate and rhythm GI: normal findings: bowel sounds normal and soft, non-tender  Lab Results Recent Labs    02/13/18 0745  NA 139  K 3.8  CL 110  CO2 21*  BUN 11  CREATININE 1.12   Liver Panel No results for input(s): PROT, ALBUMIN, AST, ALT, ALKPHOS, BILITOT, BILIDIR, IBILI in the last 72 hours. Sedimentation Rate No results for input(s): ESRSEDRATE in the last 72 hours. C-Reactive Protein No results for input(s): CRP in the last 72 hours.  Microbiology: Recent Results (from the past 240 hour(s))  CSF culture     Status: None (Preliminary result)   Collection Time: 02/12/18  4:17 PM  Result Value Ref Range Status   Specimen Description CSF  Final   Special Requests NONE  Final   Gram Stain NO WBC SEEN NO ORGANISMS SEEN CYTOSPIN  SMEAR   Final   Culture   Final    NO GROWTH 2 DAYS Performed at [facility], [address] [place], [place] [address]    Report Status PENDING  Incomplete    Studies/Results: Ct Angio Head W Or Wo Contrast  Result Date: 02/13/2018 CLINICAL DATA:  Stroke follow-up EXAM: CT ANGIOGRAPHY HEAD AND NECK TECHNIQUE: Multidetector CT imaging of the head and neck was performed using the standard protocol during bolus administration of intravenous contrast. Multiplanar CT image reconstructions and MIPs were obtained to evaluate the vascular anatomy. Carotid stenosis measurements (when applicable) are obtained utilizing NASCET criteria, using the distal internal carotid diameter as the denominator. CONTRAST:  50mL ISOVUE-370 IOPAMIDOL (ISOVUE-370) INJECTION 76% COMPARISON:  Brain MRI from 2 days ago FINDINGS: CTA NECK FINDINGS Aortic arch: 2 vessel branching pattern. Right carotid system: Diffuse atheromatous wall thickening of the common carotid and proximal ICA, noncalcified. No focal or flow limiting stenosis. No ulceration or beading. Left carotid system: Diffuse atheromatous wall thickening of the common carotid and proximal ICA. No focal or flow limiting stenosis. No ulceration or beading. Vertebral arteries: Proximal subclavian plaque on the left most notably. The dominant right vertebral artery is widely patent to the dura. No flow is seen in the left vertebral artery to the distal V2 segment where there is progressive density that is likely from retrograde  flow. Skeleton: Degenerative changes without acute or aggressive finding. Prominent periapical erosion about the left lower terminal molar. Other neck: No evidence of mass or inflammation. Upper chest: Negative Review of the MIP images confirms the above findings CTA HEAD FINDINGS Anterior circulation: Carotid siphon atherosclerotic calcification with up to 50% stenosis at the paraclinoid segment on the right. A similar degree of stenosis likely  present at the left anterior genu, but limited by calcified plaque blooming and artifact from the skull base. Hypoplastic right A1 segment. Evaluation of medium size vessels is complicated by neighboring opacified venous structures. No branch occlusion, beading, or flow limiting stenosis suspected. Posterior circulation: Dominant right vertebral artery as noted above. There is likely retrograde flow into the left vertebral, with patent left PICA. Left PICA and right AICA are dominant. Moderate narrowing of the distal basilar. Moderate narrowing at the left P1 segment. Venous sinuses: Patent Anatomic variants: As above Delayed phase: No abnormal intracranial enhancement. Review of the MIP images confirms the above findings IMPRESSION: 1. No emergent finding to explain left cerebral infarcts. 2. The non dominant left vertebral artery is non-opacified in the lower neck with distal reconstitution or retrograde flow. 3. Moderate distal basilar and left P1 segment stenosis. 4. Up to 50% carotid siphon narrowing bilaterally. Electronically Signed   By: Marnee Spring M.D.   On: 02/13/2018 13:08   Ct Angio Neck W Or Wo Contrast  Result Date: 02/13/2018 CLINICAL DATA:  Stroke follow-up EXAM: CT ANGIOGRAPHY HEAD AND NECK TECHNIQUE: Multidetector CT imaging of the head and neck was performed using the standard protocol during bolus administration of intravenous contrast. Multiplanar CT image reconstructions and MIPs were obtained to evaluate the vascular anatomy. Carotid stenosis measurements (when applicable) are obtained utilizing NASCET criteria, using the distal internal carotid diameter as the denominator. CONTRAST:  50mL ISOVUE-370 IOPAMIDOL (ISOVUE-370) INJECTION 76% COMPARISON:  Brain MRI from 2 days ago FINDINGS: CTA NECK FINDINGS Aortic arch: 2 vessel branching pattern. Right carotid system: Diffuse atheromatous wall thickening of the common carotid and proximal ICA, noncalcified. No focal or flow limiting  stenosis. No ulceration or beading. Left carotid system: Diffuse atheromatous wall thickening of the common carotid and proximal ICA. No focal or flow limiting stenosis. No ulceration or beading. Vertebral arteries: Proximal subclavian plaque on the left most notably. The dominant right vertebral artery is widely patent to the dura. No flow is seen in the left vertebral artery to the distal V2 segment where there is progressive density that is likely from retrograde flow. Skeleton: Degenerative changes without acute or aggressive finding. Prominent periapical erosion about the left lower terminal molar. Other neck: No evidence of mass or inflammation. Upper chest: Negative Review of the MIP images confirms the above findings CTA HEAD FINDINGS Anterior circulation: Carotid siphon atherosclerotic calcification with up to 50% stenosis at the paraclinoid segment on the right. A similar degree of stenosis likely present at the left anterior genu, but limited by calcified plaque blooming and artifact from the skull base. Hypoplastic right A1 segment. Evaluation of medium size vessels is complicated by neighboring opacified venous structures. No branch occlusion, beading, or flow limiting stenosis suspected. Posterior circulation: Dominant right vertebral artery as noted above. There is likely retrograde flow into the left vertebral, with patent left PICA. Left PICA and right AICA are dominant. Moderate narrowing of the distal basilar. Moderate narrowing at the left P1 segment. Venous sinuses: Patent Anatomic variants: As above Delayed phase: No abnormal intracranial enhancement. Review of the MIP images confirms  the above findings IMPRESSION: 1. No emergent finding to explain left cerebral infarcts. 2. The non dominant left vertebral artery is non-opacified in the lower neck with distal reconstitution or retrograde flow. 3. Moderate distal basilar and left P1 segment stenosis. 4. Up to 50% carotid siphon narrowing  bilaterally. Electronically Signed   By: Marnee Spring M.D.   On: 02/13/2018 13:08     Assessment/Plan: CVA- embolic Prev Syphilis  Total days of antibiotics: 0  CSF shows 1 WBC and 1 RBC, protein of 80 I would agree with Dr Daiva Eves, these numbers are not convincing for neurosyphilis. His RPR 1:2 is more consistent with pre treated syphilis. Will await his CSF VDRL for more info...         Johny Sax MD, FACP Infectious Diseases (pager) [phone] [email or web address] 02/15/2018, 8:37 AM  LOS: 5 days

## 2018-02-15 NOTE — Consult Note (Signed)
Physical Medicine and Rehabilitation Consult   Reason for Consult: Functional deficits Referring Physician: Dr. Dartha Lodge   HPI: Robert Donovan is 62 y.o. left handed male with history of HTN, CVA, OA, STD (gonorrhea) in the past who was admitted on 02/10/18 from MD office with positive syphilis test. History taken from chart review, patient, and family; reported  6 month history of progressive decline with difficulty walking, falls and cognitive deficits with difficulty talking.  He was found to have right sided weakness and MRI brain reviewed, showing mulitple left brain infarct.  Per report, multiple areas of acute infarct in L-ACA and L-MCA felt to be embolic and advance chronic ischemic changes with multiple areas of chronic microhemorrhages. CTA with moderate distal basilar, left P1 segment stenosis and up to 50% carotid siphon narrowing bilaterally. 2D echo  showed normal EF with moderate LVH and technically difficult images.   ID consulted for input and LP done revealing WBC-2 with rare lymphs, glucose 74 and total protein 80. CSF culture negative and no organisms seen on cytospin smear. CSF FTA ordered to help guide treatment. Patient reported buying PCN on street to treat his STD in the past.  BLE dopplers negative for DVT. 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 Therapy evaluations done revealing cognitive deficits, visual deficits as well as weakness affecting mobility and ADLs. CIR recommended for follow up therapy.     Review of Systems  Constitutional: Positive for malaise/fatigue.  Eyes: Positive for blurred vision.  Respiratory: Positive for shortness of breath.   Musculoskeletal: Positive for falls and myalgias.  Neurological: Positive for focal weakness and weakness.  Psychiatric/Behavioral: Positive for memory loss.  All other systems reviewed and are negative.     Past Medical History:    Diagnosis Date  . Arthritis   . High cholesterol   . Hypertension     History reviewed. No pertinent surgical history.    Family History  Problem Relation Age of Onset  . Healthy Mother   . Heart attack Father   . Diabetes Brother      Social History:  Unemployed and currently homeless.  Reports that parents house is "already full". 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 He has never used smokeless tobacco. He reports that he quit using alcohol about 19y years ago. He reports that he has used crack/cocaine/marijuana in the past--quit about 19 years ago.   Allergies: No Known Allergies    Medications Prior to Admission  Medication Sig Dispense Refill  . aspirin EC 81 MG tablet Take 1 tablet (81 mg total) by mouth daily. 90 tablet 3  . atorvastatin (LIPITOR) 40 MG tablet Take 1 tablet (40 mg total) by mouth daily. 30 tablet 11  . lisinopril-hydrochlorothiazide (PRINZIDE,ZESTORETIC) 20-25 MG tablet Take 1 tablet by mouth daily. 30 tablet 11  . acetaminophen-codeine (TYLENOL #3) 300-30 MG tablet Take 1 tablet by mouth every 8 (eight) hours as needed for moderate pain. 42 tablet 0    Home: Home Living Family/patient expects to be discharged to:: Unsure Available Help at Discharge: Family, Friend(s), Available PRN/intermittently Type of Home: Homeless Additional Comments: reports unsure of discharge plan; possibity to stay with friend "[name]" and reports his mother is trying to help  Functional History: Prior Function Level of Independence: Independent Comments: prior to recent falls and weakness, patient reports independent with ADls and mobility (  reports living in his truck) Functional Status:  Mobility: Bed Mobility Overal bed mobility: Needs Assistance Bed Mobility: Rolling, Sidelying to Sit Rolling: Mod assist Sidelying to sit: Mod assist General bed mobility comments: Received up in chair with OT Transfers Overall transfer  level: Needs assistance Equipment used: Rolling walker (2 wheeled) Transfers: Sit to/from Stand Sit to Stand: Mod assist General transfer comment: Heavy mod assist to power up to standing at EOB with min assist to take pivotal steps toward chair.  Ambulation/Gait Ambulation/Gait assistance: Min assist Gait Distance (Feet): 60 Feet(x4 with extended rest breaks between trials) Assistive device: Rolling walker (2 wheeled) Gait Pattern/deviations: Trunk flexed, Step-through pattern, Wide base of support, Decreased stride length General Gait Details: heavy reliance on RW with increased trunk flexion, R knee held in extension during swing phase with pt demonstrating wide BOS to accommodate.  Gait velocity: decreased Gait velocity interpretation: <1.31 ft/sec, indicative of household ambulator    ADL: ADL Overall ADL's : Needs assistance/impaired Grooming: Supervision/safety, Sitting, Oral care Grooming Details (indicate cue type and reason): Increased time and difficulty with in-hand manipulation tasks.  Upper Body Bathing: Set up, Sitting Lower Body Bathing: Sit to/from stand, Moderate assistance, +2 for physical assistance Lower Body Bathing Details (indicate cue type and reason): +2 for standing  Upper Body Dressing : Minimal assistance, Sitting Lower Body Dressing: Moderate assistance, +2 for physical assistance, Sit to/from stand Lower Body Dressing Details (indicate cue type and reason): +2 for standing  Toilet Transfer: Moderate assistance, RW Toilet Transfer Details (indicate cue type and reason): simulated with stand-pivot from bed to recliner Toileting- Clothing Manipulation and Hygiene: Maximal assistance, +2 for physical assistance, Sit to/from stand Toileting - Clothing Manipulation Details (indicate cue type and reason): +2 if standing  Functional mobility during ADLs: Moderate assistance, Rolling walker General ADL Comments: Pt participating in functional RUE strengthening  activities as well as ADL tasks seated at EOB this session.   Cognition: Cognition Overall Cognitive Status: No family/caregiver present to determine baseline cognitive functioning Arousal/Alertness: Awake/alert Orientation Level: Oriented to person, Oriented to place, Oriented to time, Oriented to situation Attention: Selective Sustained Attention: Appears intact Selective Attention: Impaired Selective Attention Impairment: Verbal complex Memory: Impaired Memory Impairment: Storage deficit, Retrieval deficit, Decreased recall of new information Awareness: Appears intact Problem Solving: Impaired Problem Solving Impairment: Verbal complex Safety/Judgment: Appears intact Cognition Arousal/Alertness: Awake/alert Behavior During Therapy: WFL for tasks assessed/performed Overall Cognitive Status: No family/caregiver present to determine baseline cognitive functioning Area of Impairment: Memory, Following commands, Problem solving Memory: Decreased short-term memory Following Commands: Follows multi-step commands inconsistently Problem Solving: Slow processing, Decreased initiation, Difficulty sequencing General Comments: Pt able to answer questions well today but difficulty sequencing and following multi-step commands.    Blood pressure (!) 146/74, pulse 65, temperature 98.3 F (36.8 C), temperature source Oral, resp. rate 18, height 5\' 9"  (1.753 m), weight 132.8 kg, SpO2 100 %. Physical Exam  Nursing note and vitals reviewed. Constitutional: He is oriented to person, place, and time. He appears well-developed.  Obese  HENT:  Head: Normocephalic and atraumatic.  Eyes: EOM are normal. Right eye exhibits no discharge. Left eye exhibits no discharge.  Neck: Normal range of motion. Neck supple.  Cardiovascular: Normal rate and regular rhythm.  Respiratory: Effort normal and breath sounds normal.  GI: Soft. Bowel sounds are normal.  Musculoskeletal:  No edema or tenderness in  extremities  Neurological: He is alert and oriented to person, place, and time.  Right facial weakness.  Able to answer  orientation without difficulty.  Motor: LUE/LLE: 5/5 proximal to distal RUE: 4+/5 proximal to distal RLE: 2-/5 proximal to distal  Skin: Skin is warm and dry.  Psychiatric: His affect is blunt. His speech is delayed. He is slowed.    No results found for this or any previous visit (from the past 24 hour(s)). Ct Angio Head W Or Wo Contrast  Result Date: 02/13/2018 CLINICAL DATA:  Stroke follow-up EXAM: CT ANGIOGRAPHY HEAD AND NECK TECHNIQUE: Multidetector CT imaging of the head and neck was performed using the standard protocol during bolus administration of intravenous contrast. Multiplanar CT image reconstructions and MIPs were obtained to evaluate the vascular anatomy. Carotid stenosis measurements (when applicable) are obtained utilizing NASCET criteria, using the distal internal carotid diameter as the denominator. CONTRAST:  50mL ISOVUE-370 IOPAMIDOL (ISOVUE-370) INJECTION 76% COMPARISON:  Brain MRI from 2 days ago FINDINGS: CTA NECK FINDINGS Aortic arch: 2 vessel branching pattern. Right carotid system: Diffuse atheromatous wall thickening of the common carotid and proximal ICA, noncalcified. No focal or flow limiting stenosis. No ulceration or beading. Left carotid system: Diffuse atheromatous wall thickening of the common carotid and proximal ICA. No focal or flow limiting stenosis. No ulceration or beading. Vertebral arteries: Proximal subclavian plaque on the left most notably. The dominant right vertebral artery is widely patent to the dura. No flow is seen in the left vertebral artery to the distal V2 segment where there is progressive density that is likely from retrograde flow. Skeleton: Degenerative changes without acute or aggressive finding. Prominent periapical erosion about the left lower terminal molar. Other neck: No evidence of mass or inflammation. Upper chest:  Negative Review of the MIP images confirms the above findings CTA HEAD FINDINGS Anterior circulation: Carotid siphon atherosclerotic calcification with up to 50% stenosis at the paraclinoid segment on the right. A similar degree of stenosis likely present at the left anterior genu, but limited by calcified plaque blooming and artifact from the skull base. Hypoplastic right A1 segment. Evaluation of medium size vessels is complicated by neighboring opacified venous structures. No branch occlusion, beading, or flow limiting stenosis suspected. Posterior circulation: Dominant right vertebral artery as noted above. There is likely retrograde flow into the left vertebral, with patent left PICA. Left PICA and right AICA are dominant. Moderate narrowing of the distal basilar. Moderate narrowing at the left P1 segment. Venous sinuses: Patent Anatomic variants: As above Delayed phase: No abnormal intracranial enhancement. Review of the MIP images confirms the above findings IMPRESSION: 1. No emergent finding to explain left cerebral infarcts. 2. The non dominant left vertebral artery is non-opacified in the lower neck with distal reconstitution or retrograde flow. 3. Moderate distal basilar and left P1 segment stenosis. 4. Up to 50% carotid siphon narrowing bilaterally. Electronically Signed   By: Marnee SpringJonathon  Watts M.D.   On: 02/13/2018 13:08   Ct Angio Neck W Or Wo Contrast  Result Date: 02/13/2018 CLINICAL DATA:  Stroke follow-up EXAM: CT ANGIOGRAPHY HEAD AND NECK TECHNIQUE: Multidetector CT imaging of the head and neck was performed using the standard protocol during bolus administration of intravenous contrast. Multiplanar CT image reconstructions and MIPs were obtained to evaluate the vascular anatomy. Carotid stenosis measurements (when applicable) are obtained utilizing NASCET criteria, using the distal internal carotid diameter as the denominator. CONTRAST:  50mL ISOVUE-370 IOPAMIDOL (ISOVUE-370) INJECTION 76%  COMPARISON:  Brain MRI from 2 days ago FINDINGS: CTA NECK FINDINGS Aortic arch: 2 vessel branching pattern. Right carotid system: Diffuse atheromatous wall thickening of the common carotid  and proximal ICA, noncalcified. No focal or flow limiting stenosis. No ulceration or beading. Left carotid system: Diffuse atheromatous wall thickening of the common carotid and proximal ICA. No focal or flow limiting stenosis. No ulceration or beading. Vertebral arteries: Proximal subclavian plaque on the left most notably. The dominant right vertebral artery is widely patent to the dura. No flow is seen in the left vertebral artery to the distal V2 segment where there is progressive density that is likely from retrograde flow. Skeleton: Degenerative changes without acute or aggressive finding. Prominent periapical erosion about the left lower terminal molar. Other neck: No evidence of mass or inflammation. Upper chest: Negative Review of the MIP images confirms the above findings CTA HEAD FINDINGS Anterior circulation: Carotid siphon atherosclerotic calcification with up to 50% stenosis at the paraclinoid segment on the right. A similar degree of stenosis likely present at the left anterior genu, but limited by calcified plaque blooming and artifact from the skull base. Hypoplastic right A1 segment. Evaluation of medium size vessels is complicated by neighboring opacified venous structures. No branch occlusion, beading, or flow limiting stenosis suspected. Posterior circulation: Dominant right vertebral artery as noted above. There is likely retrograde flow into the left vertebral, with patent left PICA. Left PICA and right AICA are dominant. Moderate narrowing of the distal basilar. Moderate narrowing at the left P1 segment. Venous sinuses: Patent Anatomic variants: As above Delayed phase: No abnormal intracranial enhancement. Review of the MIP images confirms the above findings IMPRESSION: 1. No emergent finding to explain  left cerebral infarcts. 2. The non dominant left vertebral artery is non-opacified in the lower neck with distal reconstitution or retrograde flow. 3. Moderate distal basilar and left P1 segment stenosis. 4. Up to 50% carotid siphon narrowing bilaterally. Electronically Signed   By: Marnee Spring M.D.   On: 02/13/2018 13:08    Assessment/Plan: Diagnosis: L-ACA and L-MCA infarcts Labs and images (see above) independently reviewed.  Records reviewed and summated above. Stroke: Continue secondary stroke prophylaxis and Risk Factor Modification listed below:   Antiplatelet therapy:   Blood Pressure Management:  Continue current medication with prn's with permisive HTN per primary team Statin Agent:   Diabetes management:   Right sided hemiparesis: fit for orthosis to prevent contractures (PRAFO, etc) Motor recovery: Fluoxetine  1. Does the need for close, 24 hr/day medical supervision in concert with the patient's rehab needs make it unreasonable for this patient to be served in a less intensive setting? Yes 2. Co-Morbidities requiring supervision/potential complications: syphilis, HTN (monitor and provide prns in accordance with increased physical exertion and pain), CVA, OA (ensure pain does not limit therapies), hypokalemia (continue to monitor and replete as necessary) 3. Due to skin/wound care, medication administration and patient education, does the patient require 24 hr/day rehab nursing? Yes 4. Does the patient require coordinated care of a physician, rehab nurse, PT (1-2 hrs/day, 5 days/week) and OT (1-2 hrs/day, 5 days/week) to address physical and functional deficits in the context of the above medical diagnosis(es)? Yes Addressing deficits in the following areas: balance, endurance, locomotion, strength, transferring, bathing, dressing, toileting and psychosocial support 5. Can the patient actively participate in an intensive therapy program of at least 3 hrs of therapy per day at  least 5 days per week? Yes 6. The potential for patient to make measurable gains while on inpatient rehab is excellent 7. Anticipated functional outcomes upon discharge from inpatient rehab are supervision and min assist  with PT, supervision and min assist with OT,  n/a with SLP. 8. Estimated rehab length of stay to reach the above functional goals is: 15-19 days. 9. Anticipated D/C setting: Other 10. Anticipated post D/C treatments: Home excercise program 11. Overall Rehab/Functional Prognosis: good  RECOMMENDATIONS: This patient's condition is appropriate for continued rehabilitative care in the following setting: CIR after completion of medical workup if caregiver support available. Patient has agreed to participate in recommended program. Potentially Note that insurance prior authorization may be required for reimbursement for recommended care.  Comment: Rehab Admissions Coordinator to follow up.   I have personally performed a face to face diagnostic evaluation, including, but not limited to relevant history and physical exam findings, of this patient and developed relevant assessment and plan.  Additionally, I have reviewed and concur with the physician assistant's documentation above.   Maryla Morrow, MD, ABPMR Jacquelynn Cree, PA-C 02/15/2018

## 2018-02-15 NOTE — NC FL2 (Signed)
[place] MEDICAID FL2 LEVEL OF CARE SCREENING TOOL     IDENTIFICATION  Patient Name: Robert Donovan Birthdate: [birth year] Sex: male Admission Date (Current Location): 02/10/2018  [facility] and [place] Number:  Producer, television/film/video and Address:  [facility] [place], [address] [place], [place] [address]      Provider Number: [id number]  Attending Physician Name and Address:  Barnetta Chapel, MD  Relative Name and Phone Number:  [name], mother, [phone]    Current Level of Care: Hospital Recommended Level of Care: Skilled Nursing Facility Prior Approval Number:    Date Approved/Denied:   PASRR Number: [id number]  Discharge Plan: SNF    Current Diagnoses: Patient Active Problem List   Diagnosis Date Noted  . Hypokalemia   . Acute embolic stroke (HCC)   . CVA (cerebral vascular accident) (HCC) 02/11/2018  . Acute kidney injury superimposed on chronic kidney disease (HCC) 02/11/2018  . History of syphilis 02/11/2018  . Constipation 02/11/2018  . Osteoarthritis 02/11/2018  . Benign essential HTN 02/11/2018  . Acute encephalopathy 02/10/2018    Orientation RESPIRATION BLADDER Height & Weight     Self, Time, Situation, Place  Normal Continent Weight: 292 lb 12.3 oz (132.8 kg) Height:  5\' 9"  (175.3 cm)  BEHAVIORAL SYMPTOMS/MOOD NEUROLOGICAL BOWEL NUTRITION STATUS      Continent Diet(please see DC summary)  AMBULATORY STATUS COMMUNICATION OF NEEDS Skin   Limited Assist Verbally Normal                       Personal Care Assistance Level of Assistance  Bathing, Feeding, Dressing Bathing Assistance: Maximum assistance Feeding assistance: Independent Dressing Assistance: Maximum assistance     Functional Limitations Info  Sight, Hearing, Speech Sight Info: Adequate Hearing Info: Adequate Speech Info: Adequate    SPECIAL CARE FACTORS FREQUENCY  PT (By licensed PT), OT (By licensed OT)     PT Frequency: 5x/week OT  Frequency: 5x/week            Contractures Contractures Info: Not present    Additional Factors Info  Code Status, Allergies Code Status Info: Full Allergies Info: No Known Allergies           Current Medications (02/15/2018):  This is the current hospital active medication list Current Facility-Administered Medications  Medication Dose Route Frequency Provider Last Rate Last Dose  .  stroke: mapping our early stages of recovery book   Does not apply Once Randel Pigg, Dorma Russell, MD      . acetaminophen (TYLENOL) tablet 650 mg  650 mg Oral Q4H PRN Randel Pigg, Dorma Russell, MD       Or  . acetaminophen (TYLENOL) solution 650 mg  650 mg Per Tube Q4H PRN Randel Pigg, Dorma Russell, MD       Or  . acetaminophen (TYLENOL) suppository 650 mg  650 mg Rectal Q4H PRN Randel Pigg, Dorma Russell, MD      . acetaminophen-codeine (TYLENOL #3) 300-30 MG per tablet 1 tablet  1 tablet Oral Q8H PRN Randel Pigg, Dorma Russell, MD   1 tablet at 02/14/18 2117  . aspirin EC tablet 81 mg  81 mg Oral Daily Randel Pigg, Dorma Russell, MD   81 mg at 02/15/18 1036  . atorvastatin (LIPITOR) tablet 40 mg  40 mg Oral Daily Randel Pigg, Dorma Russell, MD   40 mg at 02/15/18 1036  . clopidogrel (PLAVIX) tablet 75 mg  75 mg Oral Daily Marvel Plan, MD   75 mg at 02/15/18 1036  .  heparin injection 5,000 Units  5,000 Units Subcutaneous Q8H Marvel Plan, MD   5,000 Units at 02/14/18 2117  . senna-docusate (Senokot-S) tablet 1 tablet  1 tablet Oral BID Randel Pigg, Dorma Russell, MD   1 tablet at 02/13/18 5366     Discharge Medications: Please see discharge summary for a list of discharge medications.  Relevant Imaging Results:  Relevant Lab Results:   Additional Information SSN: [id number]  Abigail Butts, LCSW

## 2018-02-15 NOTE — Progress Notes (Signed)
Inpatient Rehabilitation-Admissions Coordinator   Spoke with pt at the bedside as follow up from PM&R consult. Pt interested in CIR but did state that he was homeless with no DC plan. He asked that we speak with his mother, Julieanne CottonJosephine. AC spoke with pt's mother over the phone who confirmed he has no place to go after the hospital. [facility] is an intensive, short term program and he would need to have assistance at DC. As this can not be confirmed, AC will defer to [facility] and [facility].   Please call if questions.   Nanine MeansKelly Aja Whitehair, OTR/L  Rehab Admissions Coordinator  [phone] 02/15/2018 5:00 PM

## 2018-02-16 ENCOUNTER — Inpatient Hospital Stay (HOSPITAL_COMMUNITY): Payer: Medicaid Other

## 2018-02-16 ENCOUNTER — Encounter (HOSPITAL_COMMUNITY)
Admission: EM | Disposition: A | Discharge status: Skilled Nursing Facility | Payer: Self-pay | Source: Ambulatory Visit | Attending: Internal Medicine

## 2018-02-16 ENCOUNTER — Encounter (HOSPITAL_COMMUNITY): Payer: Self-pay

## 2018-02-16 ENCOUNTER — Ambulatory Visit (INDEPENDENT_AMBULATORY_CARE_PROVIDER_SITE_OTHER): Payer: Self-pay | Admitting: Orthopaedic Surgery

## 2018-02-16 ENCOUNTER — Telehealth: Payer: Self-pay | Admitting: Physician Assistant

## 2018-02-16 DIAGNOSIS — I6389 Other cerebral infarction: Secondary | ICD-10-CM

## 2018-02-16 HISTORY — PX: TEE WITHOUT CARDIOVERSION: SHX5443

## 2018-02-16 LAB — CSF CULTURE
CULTURE: NO GROWTH
GRAM STAIN: NONE SEEN

## 2018-02-16 LAB — CSF CULTURE W GRAM STAIN

## 2018-02-16 LAB — ANCA TITERS: C-ANCA: <1:20 {titer}

## 2018-02-16 SURGERY — ECHOCARDIOGRAM, TRANSESOPHAGEAL
Anesthesia: Moderate Sedation

## 2018-02-16 MED ORDER — FENTANYL CITRATE (PF) 100 MCG/2ML IJ SOLN
INTRAMUSCULAR | Status: AC
  Filled 2018-02-16: qty 2

## 2018-02-16 MED ORDER — FENTANYL CITRATE (PF) 100 MCG/2ML IJ SOLN
INTRAMUSCULAR | Status: DC | PRN
  Administered 2018-02-16: 25 ug via INTRAVENOUS

## 2018-02-16 MED ORDER — MIDAZOLAM HCL 5 MG/ML IJ SOLN
INTRAMUSCULAR | Status: AC
  Filled 2018-02-16: qty 2

## 2018-02-16 MED ORDER — BUTAMBEN-TETRACAINE-BENZOCAINE 2-2-14 % EX AERO
INHALATION_SPRAY | CUTANEOUS | Status: DC | PRN
  Administered 2018-02-16: 2 via TOPICAL

## 2018-02-16 MED ORDER — MIDAZOLAM HCL 5 MG/5ML IJ SOLN
INTRAMUSCULAR | Status: DC | PRN
  Administered 2018-02-16: 1 mg via INTRAVENOUS
  Administered 2018-02-16: 2 mg via INTRAVENOUS

## 2018-02-16 NOTE — Progress Notes (Signed)
Pt's CSF VDRL is (-).  Would consider non-syphilitic source for his CNS event Available as needed.

## 2018-02-16 NOTE — Progress Notes (Signed)
Speech Language Pathology Treatment: Cognitive-Linquistic  Patient Details Name: Robert Donovan MRN: [id number] DOB: 09-Jun-1955 Today's Date: 02/16/2018 Time: 5366-4403 SLP Time Calculation (min) (ACUTE ONLY): 30 min  Assessment / Plan / Recommendation Clinical Impression  Attention tasks (selective and divided), functional use of cell phone to maximize independence were completed with mod cues over 30 minutes.  Divided task obviously was more difficult for pt and he required extra time but was successful with delay. Reading for understanding and circling a certain letter was divided tasks.  Reading material was memory compensation strategies and pt was able to reiterate after each sentence.  Advised pt to obtain a calendar to keep track of his appointments, money spent, etc.  Advised he could find an inexpensive one at stores.  Pt educated to reasoning for exercises with mod I and tasks left for him to complete independently.  SlP assisted pt to save important phone numbers in his cell phone with mod cues.  He expressed gratitude for a functional session.  Pt is very motivated to improve attention and thus functional memory.     HPI HPI: 62 yo male with h/o syphilis adm to [facility] with 5-6 month progressive speech deficits, RLE weakness, HTN, obesity. Pt CT showed left MCA, Left anterior cerebral artery infarcts - ? embolic.  Pt is LEFT handed and speech evaluation ordered.  Also concern present for possible neurosyphilis.        SLP Plan  Continue with current plan of care       Recommendations                   Follow up Recommendations: Skilled Nursing facility SLP Visit Diagnosis: Cognitive communication deficit (K74.259) Plan: Continue with current plan of care       GO                Robert Donovan 02/16/2018, 1:13 PM  Robert Burnet, MS [facility] SLP Acute Rehab Services Pager [phone] Office [phone]

## 2018-02-16 NOTE — CV Procedure (Signed)
Brief TEE note  No PFO or ASD Negative bubble study No thrombus in LA/LAA or RA/RAA.  During this procedure the patient is administered a total of Versed 3 mg and Fentanyl 25 mcg to achieve and maintain moderate conscious sedation.  The patient's heart rate, blood pressure, and oxygen saturation are monitored continuously during the procedure. The period of conscious sedation is 14 minutes, of which I was present face-to-face 100% of this time.  Robert RedBridgette Kameran Lallier, MD, PhD [facility]  [facility]  [address], [address] [place], [place] [address] [phone]

## 2018-02-16 NOTE — Progress Notes (Signed)
  Echocardiogram Echocardiogram Transesophageal has been performed.  Robert Donovan 02/16/2018, 3:37 PM

## 2018-02-16 NOTE — Progress Notes (Signed)
Notified MD of htn

## 2018-02-16 NOTE — Progress Notes (Signed)
STROKE TEAM PROGRESS NOTE   SUBJECTIVE (INTERVAL HISTORY) No family is at the bedside.   He has no new complaints. CSF VDRL is negative. TEE is pending for later today.    OBJECTIVE Vitals:   02/16/18 0434 02/16/18 0816 02/16/18 1329 02/16/18 1408  BP: (!) 174/88 (!) 160/93 (!) 146/90 (!) 182/75  Pulse: 73 70 72 72  Resp: _0 Temp: 98 F (36.7 C) 98.2 F (36.8 C) 98.2 F (36.8 C) 98.5 F (36.9 C)  TempSrc: Oral Oral Oral Oral  SpO2: 95% 100% 97% 96%  Weight:      Height:        CBC:  Recent Labs  Lab 02/10/18 1722 02/11/18 0345  WBC 10.5 10.4  NEUTROABS 6.6 6.3  HGB 16.2 14.4  HCT 48.6 44.4  MCV 87.1 87.7  PLT 247 841    Basic Metabolic Panel:  Recent Labs  Lab 02/13/18 0745 02/15/18 2124  NA 139 139  K 3.8 4.0  CL 110 107  CO2 21* 23  GLUCOSE 102* 127*  BUN 11 16  CREATININE 1.12 1.35*  CALCIUM 8.8* 8.9    Lipid Panel:     Component Value Date/Time   CHOL 120 02/11/2018 0345   TRIG 82 02/11/2018 0345   HDL 28 (L) 02/11/2018 0345   CHOLHDL 4.3 02/11/2018 0345   VLDL 16 02/11/2018 0345   LDLCALC 76 02/11/2018 0345   HgbA1c:  Lab Results  Component Value Date   HGBA1C 5.3 02/11/2018   Urine Drug Screen:     Component Value Date/Time   LABOPIA POSITIVE (A) 02/15/2018 1130   COCAINSCRNUR NONE DETECTED 02/15/2018 1130   LABBENZ NONE DETECTED 02/15/2018 1130   AMPHETMU NONE DETECTED 02/15/2018 1130   THCU NONE DETECTED 02/15/2018 1130   LABBARB NONE DETECTED 02/15/2018 1130    Alcohol Level No results found for: ETH  IMAGING  Ct Angio Head W Or Wo Contrast Ct Angio Neck W Or Wo Contrast 02/13/2018 IMPRESSION:  1. No emergent finding to explain left cerebral infarcts.  2. The non dominant left vertebral artery is non-opacified in the lower neck with distal reconstitution or retrograde flow.  3. Moderate distal basilar and left P1 segment stenosis.  4. Up to 50% carotid siphon narrowing bilaterally.    Transthoracic  Echocardiogram with bubble study - Normal LV systolic function   Grade 1 diastolic dysfunction.   Technically difficult images.   Bilateral LE Dopplers 02/14/2018 Negative for DVT   Ct Head Wo Contrast Result Date: 02/10/2018 CLINICAL DATA:  Altered mental status EXAM: CT HEAD WITHOUT CONTRAST TECHNIQUE: Contiguous axial images were obtained from the base of the skull through the vertex without intravenous contrast. COMPARISON:  09/24/2017 CT FINDINGS: Brain: No acute territorial infarction, hemorrhage or intracranial mass. More focal hypodensity within the left subcortical white matter, new since 09/24/2017 comparison CT. Chronic infarcts within the left thalamus and pons. Moderate small vessel ischemic changes of the white matter. Stable ventricle size. Vascular: No hyperdense vessels.  Carotid vascular calcification Skull: Normal. Negative for fracture or focal lesion. Sinuses/Orbits: No acute finding. Other: None IMPRESSION: 1. Focal hypodensity within the left parasagittal frontal lobe subcortical white matter, which may reflect age indeterminate white matter infarct or possible small focus of edema. This is new since 09/24/2017. 2. Negative for acute hemorrhage. 3. Atrophy with moderate small vessel ischemic changes of the white matter 4. Chronic lacunar infarcts in the pons and left thalamus. Electronically Signed   By: Robert Donovan  M.D.   On: 02/10/2018 18:40   Mr Brain W And Wo Contrast Result Date: 02/11/2018 CLINICAL DATA:  Confusion and able to ambulate. Abnormal CT head. Rule out neuro syphilis. EXAM: MRI HEAD WITHOUT AND WITH CONTRAST TECHNIQUE: Multiplanar, multiecho pulse sequences of the brain and surrounding structures were obtained without and with intravenous contrast. CONTRAST:  10 mL Gadovist IV COMPARISON:  CT head 02/10/2018 FINDINGS: Brain: Multiple areas of acute infarction. Largest areas over the left frontal convexity in the anterior cerebral artery territory. Small areas  of acute infarct in the left parietal periventricular white matter and left posterior temporal white matter. Extensive chronic microvascular ischemic changes throughout the white matter. Numerous chronic lacunar infarctions in the pons bilaterally, thalamus bilaterally and basal ganglia bilaterally. Multiple areas of chronic microhemorrhage in the brain including the left pons, right thalamus, right occipital lobe, left temporal lobe. Negative for mass lesion. Normal enhancement postcontrast administration. Vascular: Normal arterial flow voids Skull and upper cervical spine: Negative Sinuses/Orbits: Mild mucosal edema paranasal sinuses.  Normal orbit Other: None IMPRESSION: Multiple areas of acute infarct in the left anterior cerebral artery and left middle cerebral artery territory suggesting emboli. Advanced chronic ischemic changes as described above. Multiple areas of chronic microhemorrhage in the brain likely due to poorly controlled hypertension. If there is continued concern of neuro syphilis, lumbar puncture suggested. Electronically Signed   By: Robert Donovan M.D.   On: 02/11/2018 07:57    PHYSICAL EXAM  Temp:  [97.7 F (36.5 C)-98.8 F (37.1 C)] 98.5 F (36.9 C) (09/17 1408) Pulse Rate:  [69-73] 72 (09/17 1408) Resp:  [15-18] 15 (09/17 1408) BP: (144-182)/(75-93) 182/75 (09/17 1408) SpO2:  [91 %-100 %] 96 % (09/17 1408)  General - obese middle-aged male, in no apparent distress.  Ophthalmologic - fundi not visualized due to noncooperation.  Cardiovascular - Regular rate and rhythm.  Neurological Exam :  Awake alert oriented x 3 normal speech and language. Mild right lower face asymmetry. Tongue midline. No drift. Mild diminished fine finger movements on right Orbits left overright upper extremity. Mild right grip weak.right leg monoplegia with 0/5 strength. Tone is diminished in the right leg.. Normal sensation . Normal coordination.   ASSESSMENT/PLAN Robert Donovan is a 62  y.o. male with history of previous stroke, hyperlipidemia, remote syphilis, and hypertension presenting with slurred speech, falls, and dragging lt foot . He did not receive IV t-PA due to late presentation.  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  MRI head - Multiple areas of acute infarct in the left anterior cerebral artery and left middle cerebral artery territory suggesting emboli.  Chronic bilateral pontine, left thalamic and right ACA punctate infarcts.  CTA H&N - Up to 50% carotid siphon narrowing bilaterally.  Distal basilar and left P1 moderate stenosis.  2D Echo - unremarkable  LE venous Doppler - negative for DVT  LDL - 76  HgbA1c - 5.3  Hypercoagulable and autoimmune work-up negative so far  Recommend TEE    VTE prophylaxis -heparin subq  aspirin 81 mg daily prior to admission, now on aspirin 81 mg daily and clopidogrel 75 mg daily.  Continue DAPT for 3 weeks and then Plavix alone.  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 penicillin on the street, not through health department), but not known syphilis diagnosis  He admitted that he had gonorrhea diagnosis in the past but treated with a penicillin (not sure why gonorrhea was treated by penicillin)     Positive ANA  ANA positive  ESR CRP negative Autoimmune  work-up negative   Hypertension  Stable . Permissive hypertension (OK if < 220/120) but gradually normalize in 5-7 days . Long-term BP goal normotensive  Hyperlipidemia  Lipid lowering medication PTA: Lipitor 40 mg daily  LDL 76, goal < 70  Current lipid lowering medication: Lipitor 40 mg daily  Continue statin at discharge   Other Stroke Risk Factors  Advanced age  Former cigarette smoker - quit  ETOH use, advised to drink no more than 1 alcoholic beverage per day.  Obesity, Body mass index is 43.23 kg/m., recommend weight loss, diet and exercise as appropriate   Previous stroke imaging  Other Active Problems  Elevated creatinine, 1.66-1.39-1.12   Hospital day # 6    Recommend check TEE and 30 day heart monitor for paroxysmal A. Fib. Discussed with Dr. Marthenia Rolling. Follow-up as an outpatient in stroke clinic. In 6 weeks.  Antony Contras MD Stroke Neurology    To contact Stroke Continuity provider, please refer to [email or web address] After hours, contact General Neurology

## 2018-02-16 NOTE — Progress Notes (Signed)
CSW following for discharge plan. CSW emailed with Publishing copyinancial Counselor Ana about the status of patient's Medicaid application; Medicaid will need to be applied for prior to patient being placed at SNF under LOG. Per [place], patient's Medicaid application will be submitted by the end of the day tomorrow.  CSW to follow.  Blenda NicelyElizabeth Florean Hoob[place], [place] Clinical Social Worker [phone]

## 2018-02-16 NOTE — Progress Notes (Signed)
Physical Therapy Treatment Patient Details Name: Robert Donovan MRN: [id number] DOB: 07-03-1955 Today's Date: 02/16/2018    History of Present Illness Alastair Santopietro is a 62 y.o. male with medical history significant of HTN, HLD, CVA, obesity, and osteoarthritis. Reports progressive decline of the last 4-5 months, including slurred speech and R sided weakness with 2 recent falls.  MRI noted with many micro strokes L ACA and L MCA with areas of micro hemorrhage. Plan for LP to rule out conribution of syphilis. PMH signficant for: arthritis, HTN.     PT Comments    Pt tolerated therapy well. Therapeutic intervention and gait training performed with assistive device. Pt continues to require assist. Pt motivated and progressing toward goals. Current POC remains appropriate.   Follow Up Recommendations  CIR     Equipment Recommendations  Rolling walker with 5" wheels    Recommendations for Other Services       Precautions / Restrictions Precautions Precautions: Fall Restrictions Weight Bearing Restrictions: No    Mobility  Bed Mobility Overal bed mobility: Needs Assistance Bed Mobility: Rolling;Sidelying to Sit Rolling: Min guard Sidelying to sit: Mod assist       General bed mobility comments: Pt min guard for rolling from supine for safety and mod assist for sidelying to sit to initiate trunk control to sitting upright  Transfers Overall transfer level: Needs assistance Equipment used: Rolling walker (2 wheeled) Transfers: Sit to/from Stand Sit to Stand: Mod assist         General transfer comment: Heavy mod for power up initation from EOB  Ambulation/Gait Ambulation/Gait assistance: Min assist Gait Distance (Feet): 70 Feet Assistive device: Rolling walker (2 wheeled) Gait Pattern/deviations: Step-through pattern;Decreased weight shift to left;Trunk flexed Gait velocity: decreased Gait velocity interpretation: <1.31 ft/sec, indicative of household ambulator General  Gait Details: Pt required verbal cueing to keep trunk up right. Pt instructed to shift weight to the L in order to advance R LE   Stairs             Wheelchair Mobility    Modified Rankin (Stroke Patients Only) Modified Rankin (Stroke Patients Only) Pre-Morbid Rankin Score: No symptoms Modified Rankin: Moderately severe disability     Balance Overall balance assessment: Needs assistance Sitting-balance support: Feet supported;No upper extremity supported Sitting balance-Leahy Scale: Fair     Standing balance support: Bilateral upper extremity supported Standing balance-Leahy Scale: Poor Standing balance comment: heavy reliance on RW. Decreased time to full standing.                             Cognition Arousal/Alertness: Awake/alert Behavior During Therapy: WFL for tasks assessed/performed Overall Cognitive Status: No family/caregiver present to determine baseline cognitive functioning                         Following Commands: Follows multi-step commands consistently     Problem Solving: Requires verbal cues;Requires tactile cues;Decreased initiation General Comments: Pt able to answer questions and follow commands but required repetitive cues      Exercises Other Exercises Other Exercises: sit <> stand trials with cues for positioning and momentum. Altered level of bed height to increase challange. Min guard from heighest x5; lowered 2 in. min A x5; lowered another 2 in. mod A x3; lowered 2+ person A x1. (Required verbal cues for hand placement on RW) Other Exercises: Gait retraining trails with facilitation for weight shift. Music used throughout treatment  in order to facilitate cadence momentum for sit to stands and ambulation.  Other Exercises: Functional reaching tasks this session seated at EOB and standing at RW.      General Comments        Pertinent Vitals/Pain Pain Assessment: Faces(Arthritic pain) Faces Pain Scale: Hurts a  little bit Pain Location: knee Pain Intervention(s): Monitored during session    Home Living                      Prior Function            PT Goals (current goals can now be found in the care plan section) Acute Rehab PT Goals Patient Stated Goal: to get stronger PT Goal Formulation: With patient Time For Goal Achievement: 02/26/18 Potential to Achieve Goals: Good Progress towards PT goals: Progressing toward goals    Frequency    Min 4X/week      PT Plan Current plan remains appropriate    Co-evaluation              AM-PAC PT "6 Clicks" Daily Activity  Outcome Measure  Difficulty turning over in bed (including adjusting bedclothes, sheets and blankets)?: A Lot Difficulty moving from lying on back to sitting on the side of the bed? : Unable Difficulty sitting down on and standing up from a chair with arms (e.g., wheelchair, bedside commode, etc,.)?: Unable Help needed moving to and from a bed to chair (including a wheelchair)?: A Lot Help needed walking in hospital room?: A Little Help needed climbing 3-5 steps with a railing? : Total 6 Click Score: 10    End of Session Equipment Utilized During Treatment: Gait belt Activity Tolerance: Patient tolerated treatment well Patient left: in chair;with call bell/phone within reach;with chair alarm set Nurse Communication: Mobility status PT Visit Diagnosis: Other abnormalities of gait and mobility (R26.89);Difficulty in walking, not elsewhere classified (R26.2);Pain;Muscle weakness (generalized) (M62.81) Pain - Right/Left: Right Pain - part of body: Knee     Time: 1610-9604 PT Time Calculation (min) (ACUTE ONLY): 23 min  Charges:  $Gait Training: 8-22 mins $Therapeutic Activity: 8-22 mins                     Denman George SPT   Denman George 02/16/2018, 2:05 PM

## 2018-02-16 NOTE — Telephone Encounter (Signed)
Josefine is calling on behalf of Rise PatienceDarryl Clack. Says it is urgent you get back to her due to Mr.Gwendolyn GrantWalden being in the hospital. Please follow up.

## 2018-02-16 NOTE — Interval H&P Note (Signed)
History and Physical Interval Note:  02/16/2018 2:43 PM  Robert Donovan  has presented today for surgery, with the diagnosis of stroke  The various methods of treatment have been discussed with the patient and family. After consideration of risks, benefits and other options for treatment, the patient has consented to  Procedure(s): TRANSESOPHAGEAL ECHOCARDIOGRAM (TEE) (N/A) as a surgical intervention .  The patient's history has been reviewed, patient examined, no change in status, stable for surgery.  I have reviewed the patient's chart and labs.  Questions were answered to the patient's satisfaction.     [name]

## 2018-02-16 NOTE — Progress Notes (Signed)
PROGRESS NOTE [facility]   Robert Donovan   UUV:[id number] DOB: March 23, 1956  DOA: 02/10/2018 PCP: Loletta Specter, PA-C   Brief Narrative:  Patient is a 62 year old male with medical history significant for hypertension, hyperlipidemia, CVA in the past, obesity and osteoarthritis who presented to the emergency department after being seen by PCP for positive blood test of syphilis during his last evaluation.  Per H&P patient report that he contracted STD, not clear if it is gonorrhea or syphilis at the age of 46 or 40 and reported being treated multiple times, including penicillin therapy.  Patient reported that over the past 4 to 5 months, he has progressively declined with slurred speech, right foot dragging and inability to lift the right lower extremity, multiple falls with associated confusion.  CT of the head without contrast done on presentation revealed focal hypodensity of the left parasagittal frontal lobe in the subcortical white matter which was new when compared to previous images from April.  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  Work-up for acute CVAs in progress.  Patient underwent TEE today (02/16/2018).  There were concerns for possible neurosyphilis, and infectious disease team was consulted.  Work-up is negative for neurosyphilis.  CSF analysis was nonrevealing.  Patient is currently on aspirin and Plavix.  Neurology team is directing patient's care.  Assessment & Plan: Acute metabolic encephalopathy Work-up revealed acute CVA.   Conflicting history of possible prior syphilis infection.  Patient may have had gonorrhea.     Encephalopathy has resolved Work-up for neurosyphilis is negative.  Acute CVA MRI Brain: Multiple areas of acute infarct in the left anterior cerebral artery and left middle cerebral artery territory suggesting emboli.  Multiple areas of chronic microhemorrhage in the brain. NIH score 7.  Neurology has been consulted  recommended transfer to [facility] for further work-up.  Echocardiogram with bubble study has been ordered to rule out PFO.  Patient on aspirin and Lipitor.  Will check A1c, lipid panel and update PT and OT consult.  Follow neurology recommendations.  New neurochecks and fall precautions. 02/14/2018: Patient is currently on aspirin, Plavix and Lipitor.  Neurology is directing care.  Physical therapy input is appreciated. Patient underwent TEE today, 02/16/2018.    AKI, resolved: Likely prerenal and continuation of diuretics.  Creatinine on admission 1.66 Creatinine has improved after IV hydration.  Continue IV fluids for now.  Avoid nephrotoxic agent and check renal function in a.m. 02/13/2018: Serum creatinine today is 1.12.  Essentially, AKI has resolved.    Positive syphilis results Patient with contradicting stories regarding having syphilis in the past versus gonorrhea. However current lab work-up shows ANA, T palladium ABS and RPR positive with quantitative of 1:2, this also could be contributing to acute metabolic encephalopathy. LP has been ordered, will need to hold Lovenox tonight.  Case discussed with ID and will see in consultation.  Recommending to hold on antibiotic until definitive diagnosis has been established.  Infectious disease input is appreciated.  CSF analysis is nonrevealing.  Hep A vaccine prior to discharge.   Work-up is negative for neurosyphilis.  Essential hypertension Slight elevated BP, will allow permissive hypertension.  Continue to hold lisinopril and hydrochlorothiazide in setting of AKI. Monitor BP closely, continue to optimize.  Homeless Social work consulted.  DVT prophylaxis: Lovenox  Code Status: Full Code  Family Communication: None at bedside  Disposition Plan: Likely a skilled nursing facility  Consultants:   ID  Neurology   Procedures:  None   Antimicrobials:  None     Objective: Vitals:   02/16/18 1530 02/16/18 1531  02/16/18 1540 02/16/18 1715  BP:  (!) 172/79 (!) 181/78 (!) 185/102  Pulse:  69 65 66  Resp: (!) 27 (!) 24 (!) 22 (!) 21  Temp:  98.5 F (36.9 C)    TempSrc:  Oral    SpO2:  95% 95% 95%  Weight:      Height:        Intake/Output Summary (Last 24 hours) at 02/16/2018 1743 Last data filed at 02/16/2018 1500 Gross per 24 hour  Intake 302.57 ml  Output 550 ml  Net -247.43 ml   Filed Weights   02/11/18 2033  Weight: 132.8 kg    Examination:  General exam: NAD, slurred speech  HEENT: OP clear, poor dentition  Respiratory system: Clear to auscultation. No wheezes,crackle or rhonchi Cardiovascular system: S1 & S2 heard. Gastrointestinal system: Abdomen is obese, soft and nontender.  Organs are difficult to assess.  Central nervous system: Alert and oriented., right lower extremity is 1 to 1+/5.  Extremities: Trace B/L LE edema. Skin: No rashes Psychiatry: Mood & affect appropriate.    Data Reviewed: I have personally reviewed following labs and imaging studies  CBC: Recent Labs  Lab 02/10/18 1722 02/11/18 0345  WBC 10.5 10.4  NEUTROABS 6.6 6.3  HGB 16.2 14.4  HCT 48.6 44.4  MCV 87.1 87.7  PLT 247 206   Basic Metabolic Panel: Recent Labs  Lab 02/10/18 1722 02/11/18 0345 02/13/18 0745 02/15/18 2124  NA 141 141 139 139  K 3.5 3.3* 3.8 4.0  CL 106 106 110 107  CO2 24 22 21* 23  GLUCOSE 108* 127* 102* 127*  BUN 22* 20 11 16   CREATININE 1.66* 1.39* 1.12 1.35*  CALCIUM 9.9 9.1 8.8* 8.9   GFR: Estimated Creatinine Clearance: 78.6 mL/min (A) (by C-G formula based on SCr of 1.35 mg/dL (H)). Liver Function Tests: No results for input(s): AST, ALT, ALKPHOS, BILITOT, PROT, ALBUMIN in the last 168 hours. No results for input(s): LIPASE, AMYLASE in the last 168 hours. No results for input(s): AMMONIA in the last 168 hours. Coagulation Profile: Recent Labs  Lab 02/15/18 2124  INR 1.03   Cardiac Enzymes: No results for input(s): CKTOTAL, CKMB, CKMBINDEX, TROPONINI  in the last 168 hours. BNP (last 3 results) No results for input(s): PROBNP in the last 8760 hours. HbA1C: No results for input(s): HGBA1C in the last 72 hours. CBG: No results for input(s): GLUCAP in the last 168 hours. Lipid Profile: No results for input(s): CHOL, HDL, LDLCALC, TRIG, CHOLHDL, LDLDIRECT in the last 72 hours. Thyroid Function Tests: No results for input(s): TSH, T4TOTAL, FREET4, T3FREE, THYROIDAB in the last 72 hours. Anemia Panel: No results for input(s): VITAMINB12, FOLATE, FERRITIN, TIBC, IRON, RETICCTPCT in the last 72 hours. Sepsis Labs: No results for input(s): PROCALCITON, LATICACIDVEN in the last 168 hours.  Recent Results (from the past 240 hour(s))  CSF culture     Status: None   Collection Time: 02/12/18  4:17 PM  Result Value Ref Range Status   Specimen Description CSF  Final   Special Requests NONE  Final   Gram Stain NO WBC SEEN NO ORGANISMS SEEN CYTOSPIN SMEAR   Final   Culture   Final    NO GROWTH 3 DAYS Performed at [facility], [address], [place], [place] [address]    Report Status 02/16/2018 FINAL  Final  Radiology Studies: No results found.    Scheduled Meds: .  stroke: mapping our early stages of recovery book   Does not apply Once  . aspirin EC  81 mg Oral Daily  . atorvastatin  40 mg Oral Daily  . clopidogrel  75 mg Oral Daily  . heparin injection (subcutaneous)  5,000 Units Subcutaneous Q8H  . senna-docusate  1 tablet Oral BID   Continuous Infusions:    LOS: 6 days    Time spent: 25 minutes.     Barnetta Chapel, MD Pager: Text Page via [email or web address]   If 7PM-7AM, please contact night-coverage [email or web address] 02/16/2018, 5:43 PM

## 2018-02-17 ENCOUNTER — Ambulatory Visit (INDEPENDENT_AMBULATORY_CARE_PROVIDER_SITE_OTHER): Payer: Self-pay | Admitting: Orthopaedic Surgery

## 2018-02-17 ENCOUNTER — Ambulatory Visit (HOSPITAL_COMMUNITY): Admission: RE | Admit: 2018-02-17 | Payer: Self-pay | Source: Ambulatory Visit

## 2018-02-17 NOTE — Progress Notes (Addendum)
PROGRESS NOTE  Robert Donovan  ZOX:[id number] DOB: 12/31/1955 DOA: 02/10/2018 PCP: Loletta SpecterGomez, Roger David, PA-C   Brief Narrative: Robert Donovan is a 62 y.o. homeless male with a history of CVA, HTN, HLD, morbid obesity, and osteoarthritis who was admitted from MD office for positive syphilis test (1:2) in the setting of 6 months of functional decline found also to have right sided weakness and dysphasia. Multifocal left-sided embolic CVAs were noted on neuroimaging in addition to chronic ischemic changes with microhemorrhages. CTA with moderate distal basilar, left P1 segment stenosis and up to 50% carotid siphon narrowing bilaterally. 2D echo  showed normal EF with moderate LVH and technically difficult images.   ID was consulted for syphilis. LP was performed and no organisms noted on smear, culture negative. He had gotten penicillin on the street for previous syphilis infection and ID felt that the weak titer was consistent with previously treated infection and that CSF findings, including negative VDRL, were not convincing for neurosyphilis.  Dual antiplatelet therapy was started per neurology recommendations and therapy evaluations were performed. CIR was recommended but he was declined. The plan is for SNF admission once medicaid application is submitted.    Assessment & Plan: Active Problems:   Acute encephalopathy   CVA (cerebral vascular accident) (HCC)   Acute kidney injury superimposed on chronic kidney disease (HCC)   History of syphilis   Constipation   Osteoarthritis   Benign essential HTN   Acute embolic stroke (HCC)   Hypokalemia  Acute multifocal embolic CVAs: In left MCA and ACA territories. No cardiac embolic source found, negative bubble study. Negative TEE. - Continue DAPT x3 weeks, then plavix alone per neurology recommendations - Follow up with neurology in 6 weeks post discharge - Statin - Plan DC to SNF for ongoing right sided deficits.   AKI: Resolved with IV fluids.  CrCl >7860ml/min.  HTN:  - Permissive HTN, holding ACE and HCTZ due to this and AKI as above.  Homelessness:  - CSW following, medicaid application pending.  Positive syphilis titer: Only 1:2 most consistent with previously treated infection. No further Tx recommended by ID.  Acute metabolic encephalopathy: Resolved.   DVT prophylaxis: Lovenox Code Status: Full Family Communication: None at bedside Disposition Plan: SNF once LOG can be signed.  Consultants:   Neurology  PM&R  ID  Procedures:   Lumbar puncture 9/13  Antimicrobials:  None   Subjective: Eager to continue work with PT. Feels his deficits are stable.   Objective: Vitals:   02/16/18 2352 02/17/18 0338 02/17/18 0834 02/17/18 1212  BP: (!) 117/50 (!) 134/50 (!) 169/89 (!) 156/78  Pulse: 84 87 74 82  Resp:  20 18 18   Temp: 98.4 F (36.9 C) 98.6 F (37 C) 98.5 F (36.9 C) 98.4 F (36.9 C)  TempSrc: Oral Oral Oral Oral  SpO2: 94% 95% 97% 99%  Weight:      Height:        Intake/Output Summary (Last 24 hours) at 02/17/2018 1644 Last data filed at 02/17/2018 0657 Gross per 24 hour  Intake 360 ml  Output 725 ml  Net -365 ml   Filed Weights   02/11/18 2033  Weight: 132.8 kg    Gen: 62 y.o. male in no distress  Pulm: Non-labored breathing. Clear to auscultation bilaterally.  CV: Regular rate and rhythm. No murmur, rub, or gallop. No JVD, no pedal edema. GI: Abdomen soft, non-tender, non-distended, with normoactive bowel sounds. No organomegaly or masses felt. Ext: Warm, no deformities Skin: No  rashes, lesions no ulcers Neuro: Alert and oriented. Decreased strength in RLE (1/5), 4/5 in RUE with impaired coordination also noted. Sensation normal throughout. Psych: Judgement and insight appear fair. Mood & affect appropriate.   Data Reviewed: I have personally reviewed following labs and imaging studies  CBC: Recent Labs  Lab 02/10/18 1722 02/11/18 0345  WBC 10.5 10.4  NEUTROABS 6.6 6.3  HGB  16.2 14.4  HCT 48.6 44.4  MCV 87.1 87.7  PLT 247 206   Basic Metabolic Panel: Recent Labs  Lab 02/10/18 1722 02/11/18 0345 02/13/18 0745 02/15/18 2124  NA 141 141 139 139  K 3.5 3.3* 3.8 4.0  CL 106 106 110 107  CO2 24 22 21* 23  GLUCOSE 108* 127* 102* 127*  BUN 22* 20 11 16   CREATININE 1.66* 1.39* 1.12 1.35*  CALCIUM 9.9 9.1 8.8* 8.9   GFR: Estimated Creatinine Clearance: 78.6 mL/min (A) (by C-G formula based on SCr of 1.35 mg/dL (H)). Liver Function Tests: No results for input(s): AST, ALT, ALKPHOS, BILITOT, PROT, ALBUMIN in the last 168 hours. No results for input(s): LIPASE, AMYLASE in the last 168 hours. No results for input(s): AMMONIA in the last 168 hours. Coagulation Profile: Recent Labs  Lab 02/15/18 2124  INR 1.03   Cardiac Enzymes: No results for input(s): CKTOTAL, CKMB, CKMBINDEX, TROPONINI in the last 168 hours. BNP (last 3 results) No results for input(s): PROBNP in the last 8760 hours. HbA1C: No results for input(s): HGBA1C in the last 72 hours. CBG: No results for input(s): GLUCAP in the last 168 hours. Lipid Profile: No results for input(s): CHOL, HDL, LDLCALC, TRIG, CHOLHDL, LDLDIRECT in the last 72 hours. Thyroid Function Tests: No results for input(s): TSH, T4TOTAL, FREET4, T3FREE, THYROIDAB in the last 72 hours. Anemia Panel: No results for input(s): VITAMINB12, FOLATE, FERRITIN, TIBC, IRON, RETICCTPCT in the last 72 hours. Urine analysis:    Component Value Date/Time   COLORURINE YELLOW 02/10/2018 1722   APPEARANCEUR CLEAR 02/10/2018 1722   LABSPEC 1.025 02/10/2018 1722   PHURINE 5.0 02/10/2018 1722   GLUCOSEU NEGATIVE 02/10/2018 1722   HGBUR NEGATIVE 02/10/2018 1722   BILIRUBINUR NEGATIVE 02/10/2018 1722   KETONESUR 5 (A) 02/10/2018 1722   PROTEINUR NEGATIVE 02/10/2018 1722   NITRITE NEGATIVE 02/10/2018 1722   LEUKOCYTESUR NEGATIVE 02/10/2018 1722   Recent Results (from the past 240 hour(s))  CSF culture     Status: None    Collection Time: 02/12/18  4:17 PM  Result Value Ref Range Status   Specimen Description CSF  Final   Special Requests NONE  Final   Gram Stain NO WBC SEEN NO ORGANISMS SEEN CYTOSPIN SMEAR   Final   Culture   Final    NO GROWTH 3 DAYS Performed at [facility], [address] [place], [place] [address]    Report Status 02/16/2018 FINAL  Final      Radiology Studies: No results found.  Scheduled Meds: .  stroke: mapping our early stages of recovery book   Does not apply Once  . aspirin EC  81 mg Oral Daily  . atorvastatin  40 mg Oral Daily  . clopidogrel  75 mg Oral Daily  . heparin injection (subcutaneous)  5,000 Units Subcutaneous Q8H  . senna-docusate  1 tablet Oral BID   Continuous Infusions:   LOS: 7 days   Time spent: 25 minutes.  [name] Nine, MD [facility] [email or web address] Password [facility] 02/17/2018, 4:44 PM

## 2018-02-17 NOTE — Progress Notes (Signed)
STROKE TEAM PROGRESS NOTE   SUBJECTIVE (INTERVAL HISTORY) No family is at the bedside.   He has no new complaints.   TEE showed no PFO or thrombus and was unremarkable   OBJECTIVE Vitals:   02/16/18 2352 02/17/18 0338 02/17/18 0834 02/17/18 1212  BP: (!) 117/50 (!) 134/50 (!) 169/89 (!) 156/78  Pulse: 84 87 74 82  Resp:  _0 Temp: 98.4 F (36.9 C) 98.6 F (37 C) 98.5 F (36.9 C) 98.4 F (36.9 C)  TempSrc: Oral Oral Oral Oral  SpO2: 94% 95% 97% 99%  Weight:      Height:        CBC:  Recent Labs  Lab 02/10/18 1722 02/11/18 0345  WBC 10.5 10.4  NEUTROABS 6.6 6.3  HGB 16.2 14.4  HCT 48.6 44.4  MCV 87.1 87.7  PLT 247 785    Basic Metabolic Panel:  Recent Labs  Lab 02/13/18 0745 02/15/18 2124  NA 139 139  K 3.8 4.0  CL 110 107  CO2 21* 23  GLUCOSE 102* 127*  BUN 11 16  CREATININE 1.12 1.35*  CALCIUM 8.8* 8.9    Lipid Panel:     Component Value Date/Time   CHOL 120 02/11/2018 0345   TRIG 82 02/11/2018 0345   HDL 28 (L) 02/11/2018 0345   CHOLHDL 4.3 02/11/2018 0345   VLDL 16 02/11/2018 0345   LDLCALC 76 02/11/2018 0345   HgbA1c:  Lab Results  Component Value Date   HGBA1C 5.3 02/11/2018   Urine Drug Screen:     Component Value Date/Time   LABOPIA POSITIVE (A) 02/15/2018 1130   COCAINSCRNUR NONE DETECTED 02/15/2018 1130   LABBENZ NONE DETECTED 02/15/2018 1130   AMPHETMU NONE DETECTED 02/15/2018 1130   THCU NONE DETECTED 02/15/2018 1130   LABBARB NONE DETECTED 02/15/2018 1130    Alcohol Level No results found for: ETH  IMAGING  Ct Angio Head W Or Wo Contrast Ct Angio Neck W Or Wo Contrast 02/13/2018 IMPRESSION:  1. No emergent finding to explain left cerebral infarcts.  2. The non dominant left vertebral artery is non-opacified in the lower neck with distal reconstitution or retrograde flow.  3. Moderate distal basilar and left P1 segment stenosis.  4. Up to 50% carotid siphon narrowing bilaterally.    Transthoracic Echocardiogram  with bubble study - Normal LV systolic function   Grade 1 diastolic dysfunction.   Technically difficult images.   Bilateral LE Dopplers 02/14/2018 Negative for DVT   Ct Head Wo Contrast Result Date: 02/10/2018 CLINICAL DATA:  Altered mental status EXAM: CT HEAD WITHOUT CONTRAST TECHNIQUE: Contiguous axial images were obtained from the base of the skull through the vertex without intravenous contrast. COMPARISON:  09/24/2017 CT FINDINGS: Brain: No acute territorial infarction, hemorrhage or intracranial mass. More focal hypodensity within the left subcortical white matter, new since 09/24/2017 comparison CT. Chronic infarcts within the left thalamus and pons. Moderate small vessel ischemic changes of the white matter. Stable ventricle size. Vascular: No hyperdense vessels.  Carotid vascular calcification Skull: Normal. Negative for fracture or focal lesion. Sinuses/Orbits: No acute finding. Other: None IMPRESSION: 1. Focal hypodensity within the left parasagittal frontal lobe subcortical white matter, which may reflect age indeterminate white matter infarct or possible small focus of edema. This is new since 09/24/2017. 2. Negative for acute hemorrhage. 3. Atrophy with moderate small vessel ischemic changes of the white matter 4. Chronic lacunar infarcts in the pons and left thalamus. Electronically Signed   By: Robert Donovan  M.D.   On: 02/10/2018 18:40   Mr Brain W And Wo Contrast Result Date: 02/11/2018 CLINICAL DATA:  Confusion and able to ambulate. Abnormal CT head. Rule out neuro syphilis. EXAM: MRI HEAD WITHOUT AND WITH CONTRAST TECHNIQUE: Multiplanar, multiecho pulse sequences of the brain and surrounding structures were obtained without and with intravenous contrast. CONTRAST:  10 mL Gadovist IV COMPARISON:  CT head 02/10/2018 FINDINGS: Brain: Multiple areas of acute infarction. Largest areas over the left frontal convexity in the anterior cerebral artery territory. Small areas of acute infarct  in the left parietal periventricular white matter and left posterior temporal white matter. Extensive chronic microvascular ischemic changes throughout the white matter. Numerous chronic lacunar infarctions in the pons bilaterally, thalamus bilaterally and basal ganglia bilaterally. Multiple areas of chronic microhemorrhage in the brain including the left pons, right thalamus, right occipital lobe, left temporal lobe. Negative for mass lesion. Normal enhancement postcontrast administration. Vascular: Normal arterial flow voids Skull and upper cervical spine: Negative Sinuses/Orbits: Mild mucosal edema paranasal sinuses.  Normal orbit Other: None IMPRESSION: Multiple areas of acute infarct in the left anterior cerebral artery and left middle cerebral artery territory suggesting emboli. Advanced chronic ischemic changes as described above. Multiple areas of chronic microhemorrhage in the brain likely due to poorly controlled hypertension. If there is continued concern of neuro syphilis, lumbar puncture suggested. Electronically Signed   By: Robert Donovan M.D.   On: 02/11/2018 07:57    PHYSICAL EXAM  Temp:  [98.4 F (36.9 C)-98.8 F (37.1 C)] 98.4 F (36.9 C) (09/18 1212) Pulse Rate:  [66-87] 82 (09/18 1212) Resp:  [18-21] 18 (09/18 1212) BP: (117-185)/(44-102) 156/78 (09/18 1212) SpO2:  [94 %-99 %] 99 % (09/18 1212)  General - obese middle-aged male, in no apparent distress.  Ophthalmologic - fundi not visualized due to noncooperation.  Cardiovascular - Regular rate and rhythm.  Neurological Exam :  Awake alert oriented x 3 normal speech and language. Mild right lower face asymmetry. Tongue midline. No drift. Mild diminished fine finger movements on right Orbits left overright upper extremity. Mild right grip weak.right leg monoplegia with 0/5 strength. Tone is diminished in the right leg.. Normal sensation . Normal coordination.   ASSESSMENT/PLAN [name] is a 62 y.o. male with  history of previous stroke, hyperlipidemia, remote syphilis, and hypertension presenting with slurred speech, falls, and dragging lt foot . He did not receive IV t-PA due to late presentation.  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  MRI head - Multiple areas of acute infarct in the left anterior cerebral artery and left middle cerebral artery territory suggesting emboli.  Chronic bilateral pontine, left thalamic and right ACA punctate infarcts.  CTA H&N - Up to 50% carotid siphon narrowing bilaterally.  Distal basilar and left P1 moderate stenosis.  2D Echo - unremarkable  LE venous Doppler - negative for DVT  LDL - 76  HgbA1c - 5.3  Hypercoagulable and autoimmune work-up negative so far  TEE  normal  VTE prophylaxis -heparin subq  aspirin 81 mg daily prior to admission, now on aspirin 81 mg daily and clopidogrel 75 mg daily.  Continue DAPT for 3 weeks and then Plavix alone.  Patient counseled to be compliant with his antithrombotic medications  Ongoing aggressive stroke risk factor management  Therapy recommendations:   SNF Disposition: SNF Syphilis  RPR positive, titer 1:2 - low titer indicating treated syphilis  Serum T pallidum positive indicating syphilis infection  CSF WBC 1, protein 80, glucose 74, not consistent with neurosyphilis  VDRL negative  Patient stated that he has been treated with penicillin in the past (he bought p.o. penicillin on the street, not through health department), but not known syphilis diagnosis  He admitted that he had gonorrhea diagnosis in the past but treated with a penicillin (not sure why gonorrhea was treated by penicillin)     Positive ANA  ANA positive  ESR CRP negative Autoimmune work-up negative    Hypertension  Stable . Permissive hypertension (OK if < 220/120) but gradually normalize in 5-7 days . Long-term BP goal normotensive  Hyperlipidemia  Lipid lowering medication PTA: Lipitor 40 mg daily  LDL 76, goal < 70  Current lipid lowering medication: Lipitor 40 mg daily  Continue statin at discharge   Other Stroke Risk Factors  Advanced age  Former cigarette smoker - quit  ETOH use, advised to drink no more than 1 alcoholic beverage per day.  Obesity, Body mass index is 43.23 kg/m., recommend weight loss, diet and exercise as appropriate   Previous stroke imaging  Other Active Problems  Elevated creatinine, 1.66-1.39-1.12   Hospital day # 7    Recommend check   30 day heart monitor for paroxysmal A. Fib. Discussed with Dr. Bonner Puna Follow-up as an outpatient in stroke clinic. In 6 weeks.Stroke team will sign off. Kindly call for questions  Antony Contras MD Stroke Neurology    To contact Stroke Continuity provider, please refer to [email or web address] After hours, contact General Neurology

## 2018-02-17 NOTE — Progress Notes (Signed)
Occupational Therapy Treatment Patient Details Name: Robert Donovan MRN: [id number] DOB: 04/17/1956 Today's Date: 02/17/2018    History of present illness Robert Donovan is a 62 y.o. male with medical history significant of HTN, HLD, CVA, obesity, and osteoarthritis. Reports progressive decline of the last 4-5 months, including slurred speech and R sided weakness with 2 recent falls.  MRI noted with many micro strokes L ACA and L MCA with areas of micro hemorrhage. Plan for LP to rule out conribution of syphilis. PMH signficant for: arthritis, HTN.    OT comments  Pt remains highly motivated and is progressing well towards OT goals.  Continues to require moderate assistance for sit to stand transfers, but demonstrates ability to stand at sink completing grooming tasks with minimal assistance. Requires increased time for all tasks due to decreased strength and coordination of R side, problem solving and sequencing.  Issued and reviewed theraputty HEP.  Will continue to follow.  POC remains appropriate.    Follow Up Recommendations  CIR;Supervision/Assistance - 24 hour;SNF;Other (comment)(noted not approved for CIR)    Equipment Recommendations  Other (comment)(TBD at next venue of care)    Recommendations for Other Services Rehab consult;PT consult    Precautions / Restrictions Precautions Precautions: Fall Restrictions Weight Bearing Restrictions: No       Mobility Bed Mobility               General bed mobility comments: seated OOB in recliner   Transfers Overall transfer level: Needs assistance Equipment used: Rolling walker (2 wheeled) Transfers: Sit to/from Stand Sit to Stand: Mod assist         General transfer comment: mod assist to ascend into standing, increased time and effort; increased ease noted with 1 hand support on sink counter; cueing for hand placement, safety and sequencing     Balance Overall balance assessment: Needs assistance Sitting-balance  support: Feet supported;No upper extremity supported Sitting balance-Leahy Scale: Fair     Standing balance support: Single extremity supported;During functional activity Standing balance-Leahy Scale: Poor Standing balance comment: reliant on at least 1 UE support during standing activities, min assist for safety                            ADL either performed or assessed with clinical judgement   ADL Overall ADL's : Needs assistance/impaired     Grooming: Minimal assistance;Supervision/safety;Sitting;Standing;Wash/dry hands;Wash/dry face;Oral care Grooming Details (indicate cue type and reason): oral care seated supervision, washing hands and face standing with min assist                      Toileting- Clothing Manipulation and Hygiene: Minimal assistance;Sit to/from stand Toileting - Clothing Manipulation Details (indicate cue type and reason): standing at sink to complete hygiene with min assist to maintain balance, functional reaching with L UE      Functional mobility during ADLs: Moderate assistance General ADL Comments: patient engaging in ADLs seated/standing at sink, sit to stand transfers x 2     Vision       Perception     Praxis      Cognition Arousal/Alertness: Awake/alert Behavior During Therapy: WFL for tasks assessed/performed Overall Cognitive Status: Within Functional Limits for tasks assessed  Exercises Exercises: Other exercises Other Exercises Other Exercises: issued yellow theraputty and HEP; completed R hand gross grasp, rolling, pinch and making balls    Shoulder Instructions       General Comments      Pertinent Vitals/ Pain       Pain Assessment: No/denies pain  Home Living                                          Prior Functioning/Environment              Frequency  Min 2X/week        Progress Toward Goals  OT Goals(current  goals can now be found in the care plan section)  Progress towards OT goals: Progressing toward goals  Acute Rehab OT Goals Patient Stated Goal: to get stronger OT Goal Formulation: With patient Time For Goal Achievement: 02/26/18 Potential to Achieve Goals: Good  Plan Discharge plan remains appropriate    Co-evaluation                 AM-PAC PT "6 Clicks" Daily Activity     Outcome Measure   Help from another person eating meals?: None Help from another person taking care of personal grooming?: A Little Help from another person toileting, which includes using toliet, bedpan, or urinal?: A Lot Help from another person bathing (including washing, rinsing, drying)?: A Lot Help from another person to put on and taking off regular upper body clothing?: A Little Help from another person to put on and taking off regular lower body clothing?: A Lot 6 Click Score: 16    End of Session Equipment Utilized During Treatment: Gait belt  OT Visit Diagnosis: Other abnormalities of gait and mobility (R26.89);Unsteadiness on feet (R26.81);Muscle weakness (generalized) (M62.81);History of falling (Z91.81);Hemiplegia and hemiparesis Hemiplegia - Right/Left: Right Hemiplegia - dominant/non-dominant: Non-Dominant Hemiplegia - caused by: Cerebral infarction   Activity Tolerance Patient tolerated treatment well   Patient Left in chair;with call bell/phone within reach   Nurse Communication Mobility status        Time: 4403-4742 OT Time Calculation (min): 34 min  Charges: OT General Charges $OT Visit: 1 Visit OT Treatments $Self Care/Home Management : 8-22 mins $Neuromuscular Re-education: 8-22 mins  Chancy Milroy, OT Acute Rehabilitation Services Pager [phone] Office [phone]    Chancy Milroy 02/17/2018, 4:40 PM

## 2018-02-17 NOTE — Progress Notes (Signed)
Physical Therapy Treatment Patient Details Name: Robert Donovan MRN: [id number] DOB: 06/08/1955 Today's Date: 02/17/2018    History of Present Illness Erbie Gwendolyn GrantWalden is a 62 y.o. male with medical history significant of HTN, HLD, CVA, obesity, and osteoarthritis. Reports progressive decline of the last 4-5 months, including slurred speech and R sided weakness with 2 recent falls.  MRI noted with many micro strokes L ACA and L MCA with areas of micro hemorrhage. Plan for LP to rule out conribution of syphilis. PMH signficant for: arthritis, HTN.     PT Comments    Patient continues to remain eager to progress. Performed ambulation and functional task performance today. Patient continues to have difficulty with elevation to upright. Max cues for posture and positioning. Remains limited by arthritic pain today. Current POC remains appropriate.   Follow Up Recommendations  CIR     Equipment Recommendations  Rolling walker with 5" wheels    Recommendations for Other Services       Precautions / Restrictions Precautions Precautions: Fall Restrictions Weight Bearing Restrictions: No    Mobility  Bed Mobility Overal bed mobility: Needs Assistance Bed Mobility: Rolling;Sidelying to Sit Rolling: Min guard Sidelying to sit: Min assist       General bed mobility comments: Min assist to bring RLE off EOB and power to upright. Increased time and effort to perform  Transfers Overall transfer level: Needs assistance Equipment used: Rolling walker (2 wheeled) Transfers: Sit to/from Stand Sit to Stand: Mod assist         General transfer comment: Continues to have difficulty during power up to stand from standard height surfaces.   Ambulation/Gait Ambulation/Gait assistance: Min assist Gait Distance (Feet): 50 Feet Assistive device: Rolling walker (2 wheeled) Gait Pattern/deviations: Step-through pattern;Decreased weight shift to left;Trunk flexed Gait velocity: decreased Gait  velocity interpretation: <1.31 ft/sec, indicative of household ambulator General Gait Details: VCs for upright posture, very limited stride initially with continues cues to shift weight to the left to unload RLE   Stairs             Wheelchair Mobility    Modified Rankin (Stroke Patients Only) Modified Rankin (Stroke Patients Only) Pre-Morbid Rankin Score: No symptoms Modified Rankin: Moderately severe disability     Balance Overall balance assessment: Needs assistance Sitting-balance support: Feet supported;No upper extremity supported Sitting balance-Leahy Scale: Fair     Standing balance support: Bilateral upper extremity supported Standing balance-Leahy Scale: Poor Standing balance comment: heavy reliance on RW. Decreased time to full standing.                             Cognition Arousal/Alertness: Awake/alert Behavior During Therapy: WFL for tasks assessed/performed Overall Cognitive Status: Within Functional Limits for tasks assessed                                        Exercises      General Comments        Pertinent Vitals/Pain Pain Assessment: Faces Faces Pain Scale: Hurts even more Pain Location: knee Pain Descriptors / Indicators: Sore Pain Intervention(s): Monitored during session    Home Living                      Prior Function            PT Goals (current goals can now  be found in the care plan section) Acute Rehab PT Goals Patient Stated Goal: to get stronger PT Goal Formulation: With patient Time For Goal Achievement: 02/26/18 Potential to Achieve Goals: Good Progress towards PT goals: Progressing toward goals    Frequency    Min 4X/week      PT Plan Current plan remains appropriate    Co-evaluation              AM-PAC PT "6 Clicks" Daily Activity  Outcome Measure  Difficulty turning over in bed (including adjusting bedclothes, sheets and blankets)?: A Lot Difficulty  moving from lying on back to sitting on the side of the bed? : Unable Difficulty sitting down on and standing up from a chair with arms (e.g., wheelchair, bedside commode, etc,.)?: Unable Help needed moving to and from a bed to chair (including a wheelchair)?: A Lot Help needed walking in hospital room?: A Little Help needed climbing 3-5 steps with a railing? : Total 6 Click Score: 10    End of Session Equipment Utilized During Treatment: Gait belt Activity Tolerance: Patient tolerated treatment well Patient left: in chair;with call bell/phone within reach;with chair alarm set Nurse Communication: Mobility status PT Visit Diagnosis: Other abnormalities of gait and mobility (R26.89);Difficulty in walking, not elsewhere classified (R26.2);Pain;Muscle weakness (generalized) (M62.81) Pain - Right/Left: Right Pain - part of body: Knee     Time: 1610-9604 PT Time Calculation (min) (ACUTE ONLY): 19 min  Charges:  $Gait Training: 8-22 mins                     Charlotte Crumb, PT DPT  Board Certified Neurologic Specialist Acute Rehabilitation Services Pager [phone] Office [phone]    Fabio Asa 02/17/2018, 9:42 AM

## 2018-02-18 DIAGNOSIS — N189 Chronic kidney disease, unspecified: Secondary | ICD-10-CM

## 2018-02-18 DIAGNOSIS — N179 Acute kidney failure, unspecified: Secondary | ICD-10-CM

## 2018-02-18 DIAGNOSIS — G9340 Encephalopathy, unspecified: Secondary | ICD-10-CM

## 2018-02-18 DIAGNOSIS — K5903 Drug induced constipation: Secondary | ICD-10-CM

## 2018-02-18 LAB — MISC LABCORP TEST (SEND OUT): LABCORP TEST CODE: 828472

## 2018-02-18 MED ORDER — ACETAMINOPHEN-CODEINE #3 300-30 MG PO TABS: 1.0000 | ORAL_TABLET | Freq: Three times a day (TID) | ORAL | 0 refills | Status: DC | PRN

## 2018-02-18 MED ORDER — CLOPIDOGREL BISULFATE 75 MG PO TABS: 75.0000 mg | ORAL_TABLET | Freq: Every day | ORAL | Status: DC

## 2018-02-18 NOTE — Progress Notes (Signed)
Physical Therapy Treatment Patient Details Name: Robert Donovan MRN: [id number] DOB: 06/29/1955 Today's Date: 02/18/2018    History of Present Illness Robert Donovan is a 62 y.o. male with medical history significant of HTN, HLD, CVA, obesity, and osteoarthritis. Reports progressive decline of the last 4-5 months, including slurred speech and R sided weakness with 2 recent falls.  MRI noted with many micro strokes L ACA and L MCA with areas of micro hemorrhage. Plan for LP to rule out conribution of syphilis. PMH signficant for: arthritis, HTN.     PT Comments    Patient very motivated to work with PT. Patient with greater ease of transfer from elevated height. Does continue to require Min A for gait with multiple standing rest breaks. Verbal cueing throughout for safety with device as well as upright posturing as patient prefers a forward flexed posture. Patient excited to continue therapies.   Follow Up Recommendations  CIR     Equipment Recommendations  Rolling walker with 5" wheels    Recommendations for Other Services       Precautions / Restrictions Precautions Precautions: Fall Restrictions Weight Bearing Restrictions: No    Mobility  Bed Mobility Overal bed mobility: Needs Assistance Bed Mobility: Supine to Sit     Supine to sit: Min guard        Transfers Overall transfer level: Needs assistance Equipment used: Rolling walker (2 wheeled) Transfers: Sit to/from Stand Sit to Stand: Min assist;From elevated surface         General transfer comment: greater ease of transfer from elevated surface  Ambulation/Gait Ambulation/Gait assistance: Min assist Gait Distance (Feet): (hallway ambulation - multiple standing rest breaks ) Assistive device: Rolling walker (2 wheeled) Gait Pattern/deviations: Step-through pattern;Decreased dorsiflexion - right;Decreased weight shift to right;Trunk flexed Gait velocity: decreased   General Gait Details: verbal cueing for  posturing; and for safety with AD   Stairs             Wheelchair Mobility    Modified Rankin (Stroke Patients Only) Modified Rankin (Stroke Patients Only) Pre-Morbid Rankin Score: No symptoms Modified Rankin: Moderately severe disability     Balance Overall balance assessment: Needs assistance Sitting-balance support: Feet supported;No upper extremity supported Sitting balance-Leahy Scale: Fair     Standing balance support: Bilateral upper extremity supported;During functional activity Standing balance-Leahy Scale: Poor Standing balance comment: reliant on UE support                            Cognition Arousal/Alertness: Awake/alert Behavior During Therapy: WFL for tasks assessed/performed Overall Cognitive Status: Within Functional Limits for tasks assessed                                        Exercises      General Comments        Pertinent Vitals/Pain Pain Assessment: No/denies pain    Home Living                      Prior Function            PT Goals (current goals can now be found in the care plan section) Acute Rehab PT Goals Patient Stated Goal: to get stronger PT Goal Formulation: With patient Time For Goal Achievement: 02/26/18 Potential to Achieve Goals: Good Progress towards PT goals: Progressing toward goals  Frequency    Min 4X/week      PT Plan Current plan remains appropriate    Co-evaluation              AM-PAC PT "6 Clicks" Daily Activity  Outcome Measure  Difficulty turning over in bed (including adjusting bedclothes, sheets and blankets)?: A Lot Difficulty moving from lying on back to sitting on the side of the bed? : A Lot Difficulty sitting down on and standing up from a chair with arms (e.g., wheelchair, bedside commode, etc,.)?: Unable Help needed moving to and from a bed to chair (including a wheelchair)?: A Little Help needed walking in hospital room?: A  Little Help needed climbing 3-5 steps with a railing? : A Lot 6 Click Score: 13    End of Session Equipment Utilized During Treatment: Gait belt Activity Tolerance: Patient tolerated treatment well Patient left: in chair;with call bell/phone within reach;with chair alarm set Nurse Communication: Mobility status PT Visit Diagnosis: Other abnormalities of gait and mobility (R26.89);Difficulty in walking, not elsewhere classified (R26.2);Pain;Muscle weakness (generalized) (M62.81) Pain - Right/Left: Right Pain - part of body: Knee     Time: 1450-1517 PT Time Calculation (min) (ACUTE ONLY): 27 min  Charges:  $Gait Training: 8-22 mins $Therapeutic Activity: 8-22 mins                     Robert Donovan, PT, DPT Supplemental Physical Therapist 02/18/18 3:30 PM Pager: [phone] Office: [phone]

## 2018-02-18 NOTE — Discharge Summary (Signed)
Physician Discharge Summary  [name] ZOX:[id number] DOB: 06-07-1955 DOA: 02/10/2018  PCP: Loletta Specter, PA-C  Admit date: 02/10/2018 Discharge date: 02/18/2018  Admitted From: Home Disposition: SNF   Recommendations for Outpatient Follow-up:  1. Follow up with PCP in 1-2 weeks 2. Continue DAPT x3 weeks, then plavix alone per neurology recommendations 3. Follow up with neurology in 6 weeks post discharge 4. Monitor BMP in the next 1-2 weeks after restarting ACE, HCTZ.  Home Health: N/A Equipment/Devices: Per SNF Discharge Condition: Stable CODE STATUS: Full Diet recommendation: Heart healthy  Brief/Interim Summary: Robert Donovan is a 62 y.o. homeless male with a history of CVA, HTN, HLD, morbid obesity, and osteoarthritis who was admitted from MD office for positive syphilis test (1:2) in the setting of 6 months of functional decline found also to have right sided weakness and dysphasia. Multifocal left-sided embolic CVAs were noted on neuroimaging in addition to chronic ischemic changes with microhemorrhages. CTA with moderate distal basilar, left P1 segment stenosis and up to 50% carotid siphon narrowing bilaterally. 2D echo showed normal EF with moderate LVH and technically difficult images.   ID was consulted for syphilis. LP was performed and no organisms noted on smear, culture negative. He had gotten penicillin on the street for previous syphilis infection and ID felt that the weak titer was consistent with previously treated infection and that CSF findings, including negative VDRL, were not convincing for neurosyphilis.  Dual antiplatelet therapy was started per neurology recommendations and therapy evaluations were performed. CIR was recommended but he was declined. The plan is for SNF admission for ongoing rehabilitation.   Discharge Diagnoses:  Active Problems:   Acute encephalopathy   CVA (cerebral vascular accident) (HCC)   Acute kidney injury superimposed on  chronic kidney disease (HCC)   History of syphilis   Constipation   Osteoarthritis   Benign essential HTN   Acute embolic stroke (HCC)   Hypokalemia  Acute multifocal embolic CVAs: In left MCA and ACA territories. No cardiac embolic source found, negative bubble study. Negative TEE. - Continue DAPT x3 weeks, then plavix alone per neurology recommendations - Follow up with neurology in 6 weeks post discharge - Statin - Plan DC to SNF for ongoing right sided deficits.   AKI: Resolved with IV fluids. CrCl >65ml/min.  HTN:  - Permissive HTN initially, ACE and HCTZ were held due to this and AKI as above. Ok to restart these now.  Homelessness:  - CSW following, medicaid application submitted.   Positive syphilis titer: Only 1:2 most consistent with previously treated infection.  - No further Tx recommended by ID.  Acute metabolic encephalopathy: Resolved.   Discharge Instructions Discharge Instructions    Diet - low sodium heart healthy   Complete by:  As directed    Increase activity slowly   Complete by:  As directed      Allergies as of 02/18/2018   No Known Allergies     Medication List    TAKE these medications   acetaminophen-codeine 300-30 MG tablet Commonly known as:  TYLENOL #3 Take 1 tablet by mouth every 8 (eight) hours as needed for moderate pain.   aspirin EC 81 MG tablet Take 1 tablet (81 mg total) by mouth daily.   atorvastatin 40 MG tablet Commonly known as:  LIPITOR Take 1 tablet (40 mg total) by mouth daily.   clopidogrel 75 MG tablet Commonly known as:  PLAVIX Take 1 tablet (75 mg total) by mouth daily. Start taking on:  02/19/2018  lisinopril-hydrochlorothiazide 20-25 MG tablet Commonly known as:  PRINZIDE,ZESTORETIC Take 1 tablet by mouth daily.      Follow-up Information    Loletta Specter, PA-C Follow up.   Specialty:  Physician Assistant Contact information: [address] [place] [address] [phone]         [name]. Schedule an appointment as soon as possible for a visit in 6 week(s).   Specialty:  Neurology Contact information: [address] [place] [address] [phone]         No Known Allergies  Consultations:  Neurology  Infectious diseases  PM&R  Procedures/Studies: Ct Angio Head W Or Wo Contrast  Result Date: 02/13/2018 CLINICAL DATA:  Stroke follow-up EXAM: CT ANGIOGRAPHY HEAD AND NECK TECHNIQUE: Multidetector CT imaging of the head and neck was performed using the standard protocol during bolus administration of intravenous contrast. Multiplanar CT image reconstructions and MIPs were obtained to evaluate the vascular anatomy. Carotid stenosis measurements (when applicable) are obtained utilizing NASCET criteria, using the distal internal carotid diameter as the denominator. CONTRAST:  50mL ISOVUE-370 IOPAMIDOL (ISOVUE-370) INJECTION 76% COMPARISON:  Brain MRI from 2 days ago FINDINGS: CTA NECK FINDINGS Aortic arch: 2 vessel branching pattern. Right carotid system: Diffuse atheromatous wall thickening of the common carotid and proximal ICA, noncalcified. No focal or flow limiting stenosis. No ulceration or beading. Left carotid system: Diffuse atheromatous wall thickening of the common carotid and proximal ICA. No focal or flow limiting stenosis. No ulceration or beading. Vertebral arteries: Proximal subclavian plaque on the left most notably. The dominant right vertebral artery is widely patent to the dura. No flow is seen in the left vertebral artery to the distal V2 segment where there is progressive density that is likely from retrograde flow. Skeleton: Degenerative changes without acute or aggressive finding. Prominent periapical erosion about the left lower terminal molar. Other neck: No evidence of mass or inflammation. Upper chest: Negative Review of the MIP images confirms the above findings CTA HEAD FINDINGS Anterior  circulation: Carotid siphon atherosclerotic calcification with up to 50% stenosis at the paraclinoid segment on the right. A similar degree of stenosis likely present at the left anterior genu, but limited by calcified plaque blooming and artifact from the skull base. Hypoplastic right A1 segment. Evaluation of medium size vessels is complicated by neighboring opacified venous structures. No branch occlusion, beading, or flow limiting stenosis suspected. Posterior circulation: Dominant right vertebral artery as noted above. There is likely retrograde flow into the left vertebral, with patent left PICA. Left PICA and right AICA are dominant. Moderate narrowing of the distal basilar. Moderate narrowing at the left P1 segment. Venous sinuses: Patent Anatomic variants: As above Delayed phase: No abnormal intracranial enhancement. Review of the MIP images confirms the above findings IMPRESSION: 1. No emergent finding to explain left cerebral infarcts. 2. The non dominant left vertebral artery is non-opacified in the lower neck with distal reconstitution or retrograde flow. 3. Moderate distal basilar and left P1 segment stenosis. 4. Up to 50% carotid siphon narrowing bilaterally. Electronically Signed   By: Marnee Spring M.D.   On: 02/13/2018 13:08   Dg Lumbar Spine Complete  Result Date: 02/04/2018 CLINICAL DATA:  Per pt, states he fell about a month ago PCP told him he had arthritis in right knee and hip-states he fell today injuring his right hip and knee. Pt states he has midline lower back pain. EXAM: LUMBAR SPINE - COMPLETE 4+ VIEW COMPARISON:  09/24/2017 FINDINGS: There  are mild degenerative changes in the LOWER thoracic and lumbar spine. No acute fracture or subluxation. Visualized bowel gas pattern is nonobstructive. IMPRESSION: Degenerative changes.  No evidence for acute  abnormality. Electronically Signed   By: Norva Pavlov M.D.   On: 02/04/2018 22:11   Ct Head Wo Contrast  Result Date:  02/10/2018 CLINICAL DATA:  Altered mental status EXAM: CT HEAD WITHOUT CONTRAST TECHNIQUE: Contiguous axial images were obtained from the base of the skull through the vertex without intravenous contrast. COMPARISON:  09/24/2017 CT FINDINGS: Brain: No acute territorial infarction, hemorrhage or intracranial mass. More focal hypodensity within the left subcortical white matter, new since 09/24/2017 comparison CT. Chronic infarcts within the left thalamus and pons. Moderate small vessel ischemic changes of the white matter. Stable ventricle size. Vascular: No hyperdense vessels.  Carotid vascular calcification Skull: Normal. Negative for fracture or focal lesion. Sinuses/Orbits: No acute finding. Other: None IMPRESSION: 1. Focal hypodensity within the left parasagittal frontal lobe subcortical white matter, which may reflect age indeterminate white matter infarct or possible small focus of edema. This is new since 09/24/2017. 2. Negative for acute hemorrhage. 3. Atrophy with moderate small vessel ischemic changes of the white matter 4. Chronic lacunar infarcts in the pons and left thalamus. Electronically Signed   By: Jasmine Pang M.D.   On: 02/10/2018 18:40   Ct Angio Neck W Or Wo Contrast  Result Date: 02/13/2018 CLINICAL DATA:  Stroke follow-up EXAM: CT ANGIOGRAPHY HEAD AND NECK TECHNIQUE: Multidetector CT imaging of the head and neck was performed using the standard protocol during bolus administration of intravenous contrast. Multiplanar CT image reconstructions and MIPs were obtained to evaluate the vascular anatomy. Carotid stenosis measurements (when applicable) are obtained utilizing NASCET criteria, using the distal internal carotid diameter as the denominator. CONTRAST:  50mL ISOVUE-370 IOPAMIDOL (ISOVUE-370) INJECTION 76% COMPARISON:  Brain MRI from 2 days ago FINDINGS: CTA NECK FINDINGS Aortic arch: 2 vessel branching pattern. Right carotid system: Diffuse atheromatous wall thickening of the common  carotid and proximal ICA, noncalcified. No focal or flow limiting stenosis. No ulceration or beading. Left carotid system: Diffuse atheromatous wall thickening of the common carotid and proximal ICA. No focal or flow limiting stenosis. No ulceration or beading. Vertebral arteries: Proximal subclavian plaque on the left most notably. The dominant right vertebral artery is widely patent to the dura. No flow is seen in the left vertebral artery to the distal V2 segment where there is progressive density that is likely from retrograde flow. Skeleton: Degenerative changes without acute or aggressive finding. Prominent periapical erosion about the left lower terminal molar. Other neck: No evidence of mass or inflammation. Upper chest: Negative Review of the MIP images confirms the above findings CTA HEAD FINDINGS Anterior circulation: Carotid siphon atherosclerotic calcification with up to 50% stenosis at the paraclinoid segment on the right. A similar degree of stenosis likely present at the left anterior genu, but limited by calcified plaque blooming and artifact from the skull base. Hypoplastic right A1 segment. Evaluation of medium size vessels is complicated by neighboring opacified venous structures. No branch occlusion, beading, or flow limiting stenosis suspected. Posterior circulation: Dominant right vertebral artery as noted above. There is likely retrograde flow into the left vertebral, with patent left PICA. Left PICA and right AICA are dominant. Moderate narrowing of the distal basilar. Moderate narrowing at the left P1 segment. Venous sinuses: Patent Anatomic variants: As above Delayed phase: No abnormal intracranial enhancement. Review of the MIP images confirms the above findings IMPRESSION:  1. No emergent finding to explain left cerebral infarcts. 2. The non dominant left vertebral artery is non-opacified in the lower neck with distal reconstitution or retrograde flow. 3. Moderate distal basilar and left  P1 segment stenosis. 4. Up to 50% carotid siphon narrowing bilaterally. Electronically Signed   By: Marnee Spring M.D.   On: 02/13/2018 13:08   Mr Brain W And Wo Contrast  Result Date: 02/11/2018 CLINICAL DATA:  Confusion and able to ambulate. Abnormal CT head. Rule out neuro syphilis. EXAM: MRI HEAD WITHOUT AND WITH CONTRAST TECHNIQUE: Multiplanar, multiecho pulse sequences of the brain and surrounding structures were obtained without and with intravenous contrast. CONTRAST:  10 mL Gadovist IV COMPARISON:  CT head 02/10/2018 FINDINGS: Brain: Multiple areas of acute infarction. Largest areas over the left frontal convexity in the anterior cerebral artery territory. Small areas of acute infarct in the left parietal periventricular white matter and left posterior temporal white matter. Extensive chronic microvascular ischemic changes throughout the white matter. Numerous chronic lacunar infarctions in the pons bilaterally, thalamus bilaterally and basal ganglia bilaterally. Multiple areas of chronic microhemorrhage in the brain including the left pons, right thalamus, right occipital lobe, left temporal lobe. Negative for mass lesion. Normal enhancement postcontrast administration. Vascular: Normal arterial flow voids Skull and upper cervical spine: Negative Sinuses/Orbits: Mild mucosal edema paranasal sinuses.  Normal orbit Other: None IMPRESSION: Multiple areas of acute infarct in the left anterior cerebral artery and left middle cerebral artery territory suggesting emboli. Advanced chronic ischemic changes as described above. Multiple areas of chronic microhemorrhage in the brain likely due to poorly controlled hypertension. If there is continued concern of neuro syphilis, lumbar puncture suggested. Electronically Signed   By: Marlan Palau M.D.   On: 02/11/2018 07:57   Dg Knee Complete 4 Views Right  Result Date: 02/04/2018 CLINICAL DATA:  Fall 2 days ago.  Pain. EXAM: RIGHT KNEE - COMPLETE 4+ VIEW  COMPARISON:  None. FINDINGS: No evidence of fracture, dislocation, or joint effusion. No evidence of arthropathy or other focal bone abnormality. Mild meniscal calcifications. Proximal fibular exostosis versus heterotopic ossification. Prepatellar soft tissue swelling without subcutaneous gas or radiopaque foreign bodies. IMPRESSION: 1. Prepatellar soft tissue swelling without fracture deformity or dislocation. Electronically Signed   By: Awilda Metro M.D.   On: 02/04/2018 18:47   Dg Hip Unilat  With Pelvis 2-3 Views Right  Result Date: 02/04/2018 CLINICAL DATA:  Fall, pain. EXAM: DG HIP (WITH OR WITHOUT PELVIS) 2-3V RIGHT COMPARISON:  RIGHT hip radiograph September 24, 2017 FINDINGS: No acute fracture deformity or dislocation. Mild bilateral hip osteoarthrosis. Similar greater trochanter enthesopathy. No destructive bony lesions. Mild degenerative change of the hip. Faint calcification RIGHT femoral soft tissues, unchanged. IMPRESSION: 1. No acute fracture deformity or dislocation. 2. Stable mild bilateral hip osteoarthrosis. Electronically Signed   By: Awilda Metro M.D.   On: 02/04/2018 18:50   Dg Fluoro Guided Loc Of Needle/cath Tip For Spinal Inject Lt  Result Date: 02/12/2018 CLINICAL DATA:  Acute encephalopathy EXAM: DIAGNOSTIC LUMBAR PUNCTURE UNDER FLUOROSCOPIC GUIDANCE FLUOROSCOPY TIME:  Fluoroscopy Time:  1 minutes 12 second Radiation Exposure Index (if provided by the fluoroscopic device): Number of Acquired Spot Images: 0 PROCEDURE: Informed consent was obtained from the patient prior to the procedure, including potential complications of headache, allergy, and pain. With the patient prone, the lower back was prepped with Betadine. 1% Lidocaine was used for local anesthesia. Lumbar puncture was performed at the L2-3 level using a 20 gauge needle with return of  clear CSF with an opening pressure of 20 cm water. 8 ml of CSF were obtained for laboratory studies. The patient tolerated the  procedure well and there were no apparent complications. IMPRESSION: Successful lumbar puncture using fluoroscopy. Electronically Signed   By: Marlan Palau M.D.   On: 02/12/2018 16:48     Lumbar puncture 9/13  Subjective: Feels his strength is returning slowly in the right leg which is the most affected. Eager to continue working with PT. Still significantly weak on right side. No new deficits.  Discharge Exam: Vitals:   02/18/18 0327 02/18/18 0934  BP: 128/64 (!) 143/81  Pulse: 70 90  Resp: 18 20  Temp: 98.5 F (36.9 C) 98.2 F (36.8 C)  SpO2: 99% 96%   General: Pt is alert, awake, not in acute distress Cardiovascular: RRR, S1/S2 +, no rubs, no gallops Respiratory: CTA bilaterally, no wheezing, no rhonchi Abdominal: Soft, NT, ND, bowel sounds + Extremities: No edema, no cyanosis Neuro: Alert and oriented. Decreased strength in RLE (1/5), 4/5 in RUE with impaired coordination also noted. Sensation normal throughout.  Labs: BNP (last 3 results) No results for input(s): BNP in the last 8760 hours. Basic Metabolic Panel: Recent Labs  Lab 02/13/18 0745 02/15/18 2124  NA 139 139  K 3.8 4.0  CL 110 107  CO2 21* 23  GLUCOSE 102* 127*  BUN 11 16  CREATININE 1.12 1.35*  CALCIUM 8.8* 8.9   Liver Function Tests: No results for input(s): AST, ALT, ALKPHOS, BILITOT, PROT, ALBUMIN in the last 168 hours. No results for input(s): LIPASE, AMYLASE in the last 168 hours. No results for input(s): AMMONIA in the last 168 hours. CBC: No results for input(s): WBC, NEUTROABS, HGB, HCT, MCV, PLT in the last 168 hours. Cardiac Enzymes: No results for input(s): CKTOTAL, CKMB, CKMBINDEX, TROPONINI in the last 168 hours. BNP: Invalid input(s): POCBNP CBG: No results for input(s): GLUCAP in the last 168 hours. D-Dimer No results for input(s): DDIMER in the last 72 hours. Hgb A1c No results for input(s): HGBA1C in the last 72 hours. Lipid Profile No results for input(s): CHOL, HDL,  LDLCALC, TRIG, CHOLHDL, LDLDIRECT in the last 72 hours. Thyroid function studies No results for input(s): TSH, T4TOTAL, T3FREE, THYROIDAB in the last 72 hours.  Invalid input(s): FREET3 Anemia work up No results for input(s): VITAMINB12, FOLATE, FERRITIN, TIBC, IRON, RETICCTPCT in the last 72 hours. Urinalysis    Component Value Date/Time   COLORURINE YELLOW 02/10/2018 1722   APPEARANCEUR CLEAR 02/10/2018 1722   LABSPEC 1.025 02/10/2018 1722   PHURINE 5.0 02/10/2018 1722   GLUCOSEU NEGATIVE 02/10/2018 1722   HGBUR NEGATIVE 02/10/2018 1722   BILIRUBINUR NEGATIVE 02/10/2018 1722   KETONESUR 5 (A) 02/10/2018 1722   PROTEINUR NEGATIVE 02/10/2018 1722   NITRITE NEGATIVE 02/10/2018 1722   LEUKOCYTESUR NEGATIVE 02/10/2018 1722    Microbiology Recent Results (from the past 240 hour(s))  CSF culture     Status: None   Collection Time: 02/12/18  4:17 PM  Result Value Ref Range Status   Specimen Description CSF  Final   Special Requests NONE  Final   Gram Stain NO WBC SEEN NO ORGANISMS SEEN CYTOSPIN SMEAR   Final   Culture   Final    NO GROWTH 3 DAYS Performed at [facility], [address] [place], [place] [address]    Report Status 02/16/2018 FINAL  Final    Time coordinating discharge: Approximately 40 minutes  [name] Nine, MD  [facility]  02/18/2018, 11:46 AM Pager [phone]

## 2018-02-18 NOTE — Progress Notes (Signed)
Discharge to: [facility] Anticipated discharge date: 02/18/18 Family notified: Yes, mom, by phone Transportation by: [facility]  Report #: [id number], Room 107  CSW signing off.  Blenda Nicelylizabeth Esias Mory LCSW [phone]

## 2018-02-18 NOTE — Progress Notes (Signed)
Pt d/c to facility, pt is stable, no new concerns. D/c instructions given to facility Nurse [place]. [facility]

## 2018-02-18 NOTE — Clinical Social Work Placement (Addendum)
CLINICAL SOCIAL WORK PLACEMENT  NOTE  Date:  02/18/2018  Patient Details  Name: Robert Donovan MRN: [id number] Date of Birth: Oct 06, 1955  Clinical Social Work is seeking post-discharge placement for this patient at the Skilled  Nursing Facility level of care (*CSW will initial, date and re-position this form in  chart as items are completed):  Yes   Patient/family provided with [facility] Clinical Social Work Department's list of facilities offering this level of care within the geographic area requested by the patient (or if unable, by the patient's family).  Yes   Patient/family informed of their freedom to choose among providers that offer the needed level of care, that participate in Medicare, Medicaid or managed care program needed by the patient, have an available bed and are willing to accept the patient.  Yes   Patient/family informed of [name] ownership interest in [facility] and [facility], as well as of the fact that they are under no obligation to receive care at these facilities.  PASRR submitted to EDS on 02/15/18     PASRR number received on 02/15/18     Existing PASRR number confirmed on       FL2 transmitted to all facilities in geographic area requested by pt/family on 02/15/18     FL2 transmitted to all facilities within larger geographic area on       Patient informed that his/her managed care company has contracts with or will negotiate with certain facilities, including the following:        Yes   Patient/family informed of bed offers received.  Patient chooses bed at [facility]     Physician recommends and patient chooses bed at      Patient to be transferred to [facility] on 02/18/18.  Patient to be transferred to facility by [facility]     Patient family notified on 02/18/18 of transfer.  Name of family member notified:  Mother, [name]     PHYSICIAN       Additional Comment:     _______________________________________________ Mearl Latin, LCSWA 02/18/2018, 4:04 PM

## 2018-02-18 NOTE — Progress Notes (Addendum)
11:45am-[name] has selected [facility]. CSW working with facility to make sure they can accept patient.   11:!2am-CSW spoke with patient's mother regarding SNF options. Patient has been approved for a Letter of Guarantee. CSW explained that patient has two facility options [facility] and that patient is medically stable for discharge. [name] requested that CSW email her the options and she will go tour them and let CSW know by this afternoon.   Osborne Cascoadia Drevin Ortner LCSW [phone]

## 2018-02-22 DIAGNOSIS — K219 Gastro-esophageal reflux disease without esophagitis: Secondary | ICD-10-CM | POA: Diagnosis not present

## 2018-02-22 DIAGNOSIS — I10 Essential (primary) hypertension: Secondary | ICD-10-CM | POA: Diagnosis not present

## 2018-02-22 DIAGNOSIS — E782 Mixed hyperlipidemia: Secondary | ICD-10-CM | POA: Diagnosis not present

## 2018-02-22 DIAGNOSIS — I6349 Cerebral infarction due to embolism of other cerebral artery: Secondary | ICD-10-CM | POA: Diagnosis not present

## 2018-02-23 DIAGNOSIS — D649 Anemia, unspecified: Secondary | ICD-10-CM | POA: Diagnosis not present

## 2018-02-23 DIAGNOSIS — Z79899 Other long term (current) drug therapy: Secondary | ICD-10-CM | POA: Diagnosis not present

## 2018-02-23 DIAGNOSIS — E785 Hyperlipidemia, unspecified: Secondary | ICD-10-CM | POA: Diagnosis not present

## 2018-02-23 DIAGNOSIS — E119 Type 2 diabetes mellitus without complications: Secondary | ICD-10-CM | POA: Diagnosis not present

## 2018-02-23 DIAGNOSIS — A539 Syphilis, unspecified: Secondary | ICD-10-CM | POA: Diagnosis not present

## 2018-02-23 DIAGNOSIS — I6349 Cerebral infarction due to embolism of other cerebral artery: Secondary | ICD-10-CM | POA: Diagnosis not present

## 2018-02-23 DIAGNOSIS — I10 Essential (primary) hypertension: Secondary | ICD-10-CM | POA: Diagnosis not present

## 2018-02-23 DIAGNOSIS — K219 Gastro-esophageal reflux disease without esophagitis: Secondary | ICD-10-CM | POA: Diagnosis not present

## 2018-02-24 ENCOUNTER — Ambulatory Visit (INDEPENDENT_AMBULATORY_CARE_PROVIDER_SITE_OTHER): Payer: Self-pay | Admitting: Physician Assistant

## 2018-03-01 DIAGNOSIS — E782 Mixed hyperlipidemia: Secondary | ICD-10-CM | POA: Diagnosis not present

## 2018-03-01 DIAGNOSIS — I10 Essential (primary) hypertension: Secondary | ICD-10-CM | POA: Diagnosis not present

## 2018-03-01 DIAGNOSIS — I6349 Cerebral infarction due to embolism of other cerebral artery: Secondary | ICD-10-CM | POA: Diagnosis not present

## 2018-03-01 DIAGNOSIS — K219 Gastro-esophageal reflux disease without esophagitis: Secondary | ICD-10-CM | POA: Diagnosis not present

## 2018-03-08 DIAGNOSIS — K219 Gastro-esophageal reflux disease without esophagitis: Secondary | ICD-10-CM | POA: Diagnosis not present

## 2018-03-08 DIAGNOSIS — I6349 Cerebral infarction due to embolism of other cerebral artery: Secondary | ICD-10-CM | POA: Diagnosis not present

## 2018-03-08 DIAGNOSIS — E782 Mixed hyperlipidemia: Secondary | ICD-10-CM | POA: Diagnosis not present

## 2018-03-08 DIAGNOSIS — I10 Essential (primary) hypertension: Secondary | ICD-10-CM | POA: Diagnosis not present

## 2018-03-10 ENCOUNTER — Telehealth (INDEPENDENT_AMBULATORY_CARE_PROVIDER_SITE_OTHER): Payer: Self-pay | Admitting: Physician Assistant

## 2018-03-10 NOTE — Telephone Encounter (Signed)
[name] called wanting to speak with PCP stating that [facility] is wanting to put [place] out next Wednesday due to his medicaid not being in place. Julieanne Cotton states that his medicaid has been filed for and is in the works on getting approve. Julieanne Cotton wants to know if PCP would be able to but him in rehab or a nursing facility until they are able to get approved for medicaid. Julieanne Cotton states she spook with various people and was suggested that a "level of care for nursing home"  would be a better fit for [place].  Julieanne Cotton [phone] or [phone]  Thank you Louisa Second

## 2018-03-12 NOTE — Telephone Encounter (Signed)
Patients mother is aware that patient is being discharged from [facility] because his treatment is done and the providers there do not feel he needs skilled nursing. She Is aware that if she wished to have services extended she would have to pay out of pocket until they receive a determination from [place]. She states she had been informed of that. Maryjean Morn, CMA

## 2018-03-15 DIAGNOSIS — I6349 Cerebral infarction due to embolism of other cerebral artery: Secondary | ICD-10-CM | POA: Diagnosis not present

## 2018-03-15 DIAGNOSIS — K219 Gastro-esophageal reflux disease without esophagitis: Secondary | ICD-10-CM | POA: Diagnosis not present

## 2018-03-15 DIAGNOSIS — E782 Mixed hyperlipidemia: Secondary | ICD-10-CM | POA: Diagnosis not present

## 2018-03-15 DIAGNOSIS — I10 Essential (primary) hypertension: Secondary | ICD-10-CM | POA: Diagnosis not present

## 2018-03-16 ENCOUNTER — Telehealth (INDEPENDENT_AMBULATORY_CARE_PROVIDER_SITE_OTHER): Payer: Self-pay | Admitting: Physician Assistant

## 2018-03-16 NOTE — Telephone Encounter (Signed)
Spoke with patients mother and informed her that patient does not have neurosyphilis. And that she would need to speak with providers at [facility] in regards to the next steps in his treatment. Maryjean Morn, CMA

## 2018-03-16 NOTE — Telephone Encounter (Signed)
Patient called wanting to know information about what Drs are handling his case.He will also like to know why nobody has contacted him about his Syphilis. Patient would also like for [place] to release documents on his condition to his mother. Patient states that she is his power of attorney. He also stated that he would like a call back.  Please advice Robert Donovan [phone] Robert Donovan [phone]  Thank you Robert Donovan

## 2018-03-21 DIAGNOSIS — I10 Essential (primary) hypertension: Secondary | ICD-10-CM | POA: Diagnosis not present

## 2018-03-22 DIAGNOSIS — I6349 Cerebral infarction due to embolism of other cerebral artery: Secondary | ICD-10-CM | POA: Diagnosis not present

## 2018-03-22 DIAGNOSIS — K219 Gastro-esophageal reflux disease without esophagitis: Secondary | ICD-10-CM | POA: Diagnosis not present

## 2018-03-22 DIAGNOSIS — E782 Mixed hyperlipidemia: Secondary | ICD-10-CM | POA: Diagnosis not present

## 2018-03-22 DIAGNOSIS — I10 Essential (primary) hypertension: Secondary | ICD-10-CM | POA: Diagnosis not present

## 2018-03-25 DIAGNOSIS — A539 Syphilis, unspecified: Secondary | ICD-10-CM | POA: Diagnosis not present

## 2018-03-25 DIAGNOSIS — I6349 Cerebral infarction due to embolism of other cerebral artery: Secondary | ICD-10-CM | POA: Diagnosis not present

## 2018-03-25 DIAGNOSIS — I10 Essential (primary) hypertension: Secondary | ICD-10-CM | POA: Diagnosis not present

## 2018-04-01 DIAGNOSIS — I10 Essential (primary) hypertension: Secondary | ICD-10-CM | POA: Diagnosis not present

## 2018-04-08 DIAGNOSIS — I10 Essential (primary) hypertension: Secondary | ICD-10-CM | POA: Diagnosis not present

## 2018-04-15 DIAGNOSIS — I10 Essential (primary) hypertension: Secondary | ICD-10-CM | POA: Diagnosis not present

## 2018-04-22 DIAGNOSIS — I6349 Cerebral infarction due to embolism of other cerebral artery: Secondary | ICD-10-CM | POA: Diagnosis not present

## 2018-04-22 DIAGNOSIS — I10 Essential (primary) hypertension: Secondary | ICD-10-CM | POA: Diagnosis not present

## 2018-04-22 DIAGNOSIS — A539 Syphilis, unspecified: Secondary | ICD-10-CM | POA: Diagnosis not present

## 2018-04-23 ENCOUNTER — Ambulatory Visit (INDEPENDENT_AMBULATORY_CARE_PROVIDER_SITE_OTHER): Payer: Self-pay | Admitting: Physician Assistant

## 2018-04-23 ENCOUNTER — Telehealth (INDEPENDENT_AMBULATORY_CARE_PROVIDER_SITE_OTHER): Payer: Self-pay | Admitting: Physician Assistant

## 2018-04-23 NOTE — Telephone Encounter (Signed)
Robert RosebushJosephine Donovan to inform that Robert Donovan is going to be released from the nursing facility and she is looking for a place where they will take disable patient. States that [facility] is going to send a letter to request his medical needs. Robert Donovan would like to know if PCP can state that patient needs a wheelchair. States that she will also need a letter stating that he needs a wheelchair to take to the facility or apartments where he would be staying in order for Robert Donovan to get a lower level apartment or room. She also states that he is doing well with his walker but can not stand or walk with out it.  Robert Donovan   Office [phone] Personal Number [phone]  Please Advice   Thank you Robert Donovan

## 2018-04-23 NOTE — Telephone Encounter (Signed)
When I last saw patient, it did not seem he needed a wheelchair. I will have to see him again. Furthermore, the message says he is doing well with the walker. He should continue to use walker until wheelchair assessment is made.

## 2018-04-23 NOTE — Telephone Encounter (Signed)
Patients mother is aware. She states he is being discharged from [facility] on 11/30. She has found housing for the patient but its a first floor unit and she states the place where he is slated to go is requiring that he have a wheelchair to be on the first floor. If patient can not obtain wheelchair before 11/30 he will not have a place to live. Advised mother to see if a provider at [facility] could do the wheelchair assessment since patient is still there. She will ask and call our office back. Mother is aware that if [facility] will not do the assessment to contact PCP office to inquire about an appointment sooner than 12/23. Maryjean Mornempestt S , CMA

## 2018-04-23 NOTE — Telephone Encounter (Signed)
FWD to PCP.  S , CMA  

## 2018-04-26 ENCOUNTER — Telehealth (INDEPENDENT_AMBULATORY_CARE_PROVIDER_SITE_OTHER): Payer: Self-pay | Admitting: Physician Assistant

## 2018-04-26 NOTE — Telephone Encounter (Signed)
Patient mother returned CMA phonecall. Per CMA front desk communicated that his PCP can not falsify the need of the wheelchair and that there should be other apartment complex with first floor availability. Per CMA I also advice the patient mother to see if [facility] facility are willing to write him the order or letter for the need of the wheelchair.  Louisa SecondGloria I Vargas Hernandez

## 2018-04-26 NOTE — Telephone Encounter (Signed)
I am sorry for the situation but I can not falsify the need for wheelchair just so patient can live in the first floor of this particular place. There may be other apartment complexes with first floor availability.

## 2018-04-28 DIAGNOSIS — I10 Essential (primary) hypertension: Secondary | ICD-10-CM | POA: Diagnosis not present

## 2018-05-02 DIAGNOSIS — I10 Essential (primary) hypertension: Secondary | ICD-10-CM | POA: Diagnosis not present

## 2018-05-04 DIAGNOSIS — E782 Mixed hyperlipidemia: Secondary | ICD-10-CM | POA: Diagnosis not present

## 2018-05-04 DIAGNOSIS — I6349 Cerebral infarction due to embolism of other cerebral artery: Secondary | ICD-10-CM | POA: Diagnosis not present

## 2018-05-04 DIAGNOSIS — I10 Essential (primary) hypertension: Secondary | ICD-10-CM | POA: Diagnosis not present

## 2018-05-04 DIAGNOSIS — K219 Gastro-esophageal reflux disease without esophagitis: Secondary | ICD-10-CM | POA: Diagnosis not present

## 2018-05-13 DIAGNOSIS — I10 Essential (primary) hypertension: Secondary | ICD-10-CM | POA: Diagnosis not present

## 2018-05-17 ENCOUNTER — Ambulatory Visit (INDEPENDENT_AMBULATORY_CARE_PROVIDER_SITE_OTHER): Payer: Self-pay | Admitting: Physician Assistant

## 2018-05-20 DIAGNOSIS — I10 Essential (primary) hypertension: Secondary | ICD-10-CM | POA: Diagnosis not present

## 2018-05-20 DIAGNOSIS — E782 Mixed hyperlipidemia: Secondary | ICD-10-CM | POA: Diagnosis not present

## 2018-05-20 DIAGNOSIS — I6349 Cerebral infarction due to embolism of other cerebral artery: Secondary | ICD-10-CM | POA: Diagnosis not present

## 2018-05-20 DIAGNOSIS — K219 Gastro-esophageal reflux disease without esophagitis: Secondary | ICD-10-CM | POA: Diagnosis not present

## 2018-05-24 ENCOUNTER — Ambulatory Visit (INDEPENDENT_AMBULATORY_CARE_PROVIDER_SITE_OTHER): Payer: Self-pay | Admitting: Nurse Practitioner

## 2018-05-24 ENCOUNTER — Ambulatory Visit (INDEPENDENT_AMBULATORY_CARE_PROVIDER_SITE_OTHER): Payer: Self-pay | Admitting: Physician Assistant

## 2018-05-25 DIAGNOSIS — M1990 Unspecified osteoarthritis, unspecified site: Secondary | ICD-10-CM | POA: Diagnosis not present

## 2018-05-25 DIAGNOSIS — G894 Chronic pain syndrome: Secondary | ICD-10-CM | POA: Diagnosis not present

## 2018-05-25 DIAGNOSIS — E782 Mixed hyperlipidemia: Secondary | ICD-10-CM | POA: Diagnosis not present

## 2018-05-25 DIAGNOSIS — K219 Gastro-esophageal reflux disease without esophagitis: Secondary | ICD-10-CM | POA: Diagnosis not present

## 2018-05-27 DIAGNOSIS — I10 Essential (primary) hypertension: Secondary | ICD-10-CM | POA: Diagnosis not present

## 2018-05-28 DIAGNOSIS — Z79899 Other long term (current) drug therapy: Secondary | ICD-10-CM | POA: Diagnosis not present

## 2018-06-02 DIAGNOSIS — I10 Essential (primary) hypertension: Secondary | ICD-10-CM | POA: Diagnosis not present

## 2018-06-10 DIAGNOSIS — I10 Essential (primary) hypertension: Secondary | ICD-10-CM | POA: Diagnosis not present

## 2018-06-16 ENCOUNTER — Encounter

## 2018-06-16 ENCOUNTER — Ambulatory Visit (INDEPENDENT_AMBULATORY_CARE_PROVIDER_SITE_OTHER): Payer: Self-pay | Admitting: Nurse Practitioner

## 2018-06-17 DIAGNOSIS — K219 Gastro-esophageal reflux disease without esophagitis: Secondary | ICD-10-CM | POA: Diagnosis not present

## 2018-06-17 DIAGNOSIS — G894 Chronic pain syndrome: Secondary | ICD-10-CM | POA: Diagnosis not present

## 2018-06-17 DIAGNOSIS — I10 Essential (primary) hypertension: Secondary | ICD-10-CM | POA: Diagnosis not present

## 2018-06-17 DIAGNOSIS — I6349 Cerebral infarction due to embolism of other cerebral artery: Secondary | ICD-10-CM | POA: Diagnosis not present

## 2018-06-17 DIAGNOSIS — M1990 Unspecified osteoarthritis, unspecified site: Secondary | ICD-10-CM | POA: Diagnosis not present

## 2018-06-22 DIAGNOSIS — K219 Gastro-esophageal reflux disease without esophagitis: Secondary | ICD-10-CM | POA: Diagnosis not present

## 2018-06-22 DIAGNOSIS — M1990 Unspecified osteoarthritis, unspecified site: Secondary | ICD-10-CM | POA: Diagnosis not present

## 2018-06-22 DIAGNOSIS — G894 Chronic pain syndrome: Secondary | ICD-10-CM | POA: Diagnosis not present

## 2018-06-22 DIAGNOSIS — I6349 Cerebral infarction due to embolism of other cerebral artery: Secondary | ICD-10-CM | POA: Diagnosis not present

## 2018-06-24 DIAGNOSIS — I10 Essential (primary) hypertension: Secondary | ICD-10-CM | POA: Diagnosis not present

## 2018-07-01 DIAGNOSIS — I10 Essential (primary) hypertension: Secondary | ICD-10-CM | POA: Diagnosis not present

## 2018-07-03 DIAGNOSIS — I10 Essential (primary) hypertension: Secondary | ICD-10-CM | POA: Diagnosis not present

## 2018-07-08 DIAGNOSIS — I10 Essential (primary) hypertension: Secondary | ICD-10-CM | POA: Diagnosis not present

## 2018-07-15 DIAGNOSIS — I10 Essential (primary) hypertension: Secondary | ICD-10-CM | POA: Diagnosis not present

## 2018-07-20 DIAGNOSIS — I6349 Cerebral infarction due to embolism of other cerebral artery: Secondary | ICD-10-CM | POA: Diagnosis not present

## 2018-07-20 DIAGNOSIS — G894 Chronic pain syndrome: Secondary | ICD-10-CM | POA: Diagnosis not present

## 2018-07-20 DIAGNOSIS — K219 Gastro-esophageal reflux disease without esophagitis: Secondary | ICD-10-CM | POA: Diagnosis not present

## 2018-07-20 DIAGNOSIS — M1990 Unspecified osteoarthritis, unspecified site: Secondary | ICD-10-CM | POA: Diagnosis not present

## 2018-07-22 DIAGNOSIS — I10 Essential (primary) hypertension: Secondary | ICD-10-CM | POA: Diagnosis not present

## 2018-07-22 DIAGNOSIS — M1990 Unspecified osteoarthritis, unspecified site: Secondary | ICD-10-CM | POA: Diagnosis not present

## 2018-07-22 DIAGNOSIS — I6349 Cerebral infarction due to embolism of other cerebral artery: Secondary | ICD-10-CM | POA: Diagnosis not present

## 2018-07-22 DIAGNOSIS — K219 Gastro-esophageal reflux disease without esophagitis: Secondary | ICD-10-CM | POA: Diagnosis not present

## 2018-07-22 DIAGNOSIS — E782 Mixed hyperlipidemia: Secondary | ICD-10-CM | POA: Diagnosis not present

## 2018-07-29 DIAGNOSIS — I10 Essential (primary) hypertension: Secondary | ICD-10-CM | POA: Diagnosis not present

## 2018-08-01 DIAGNOSIS — I10 Essential (primary) hypertension: Secondary | ICD-10-CM | POA: Diagnosis not present

## 2018-08-12 DIAGNOSIS — I10 Essential (primary) hypertension: Secondary | ICD-10-CM | POA: Diagnosis not present

## 2018-08-17 DIAGNOSIS — I6349 Cerebral infarction due to embolism of other cerebral artery: Secondary | ICD-10-CM | POA: Diagnosis not present

## 2018-08-17 DIAGNOSIS — K219 Gastro-esophageal reflux disease without esophagitis: Secondary | ICD-10-CM | POA: Diagnosis not present

## 2018-08-17 DIAGNOSIS — M1990 Unspecified osteoarthritis, unspecified site: Secondary | ICD-10-CM | POA: Diagnosis not present

## 2018-08-17 DIAGNOSIS — G894 Chronic pain syndrome: Secondary | ICD-10-CM | POA: Diagnosis not present

## 2018-08-19 DIAGNOSIS — G894 Chronic pain syndrome: Secondary | ICD-10-CM | POA: Diagnosis not present

## 2018-08-19 DIAGNOSIS — M1990 Unspecified osteoarthritis, unspecified site: Secondary | ICD-10-CM | POA: Diagnosis not present

## 2018-08-19 DIAGNOSIS — I6349 Cerebral infarction due to embolism of other cerebral artery: Secondary | ICD-10-CM | POA: Diagnosis not present

## 2018-08-19 DIAGNOSIS — I10 Essential (primary) hypertension: Secondary | ICD-10-CM | POA: Diagnosis not present

## 2018-08-19 DIAGNOSIS — K219 Gastro-esophageal reflux disease without esophagitis: Secondary | ICD-10-CM | POA: Diagnosis not present

## 2018-08-26 DIAGNOSIS — I10 Essential (primary) hypertension: Secondary | ICD-10-CM | POA: Diagnosis not present

## 2018-09-01 DIAGNOSIS — I10 Essential (primary) hypertension: Secondary | ICD-10-CM | POA: Diagnosis not present

## 2018-09-07 DIAGNOSIS — I10 Essential (primary) hypertension: Secondary | ICD-10-CM | POA: Diagnosis not present

## 2018-09-14 DIAGNOSIS — I10 Essential (primary) hypertension: Secondary | ICD-10-CM | POA: Diagnosis not present

## 2018-09-17 DIAGNOSIS — I6349 Cerebral infarction due to embolism of other cerebral artery: Secondary | ICD-10-CM | POA: Diagnosis not present

## 2018-09-17 DIAGNOSIS — A539 Syphilis, unspecified: Secondary | ICD-10-CM | POA: Diagnosis not present

## 2018-09-17 DIAGNOSIS — N170 Acute kidney failure with tubular necrosis: Secondary | ICD-10-CM | POA: Diagnosis not present

## 2018-09-17 DIAGNOSIS — E876 Hypokalemia: Secondary | ICD-10-CM | POA: Diagnosis not present

## 2018-09-23 DIAGNOSIS — I10 Essential (primary) hypertension: Secondary | ICD-10-CM | POA: Diagnosis not present

## 2018-09-30 DIAGNOSIS — I10 Essential (primary) hypertension: Secondary | ICD-10-CM | POA: Diagnosis not present

## 2018-10-07 DIAGNOSIS — I10 Essential (primary) hypertension: Secondary | ICD-10-CM | POA: Diagnosis not present

## 2018-10-13 DIAGNOSIS — M1990 Unspecified osteoarthritis, unspecified site: Secondary | ICD-10-CM | POA: Diagnosis not present

## 2018-10-13 DIAGNOSIS — I10 Essential (primary) hypertension: Secondary | ICD-10-CM | POA: Diagnosis not present

## 2018-10-13 DIAGNOSIS — I2510 Atherosclerotic heart disease of native coronary artery without angina pectoris: Secondary | ICD-10-CM | POA: Diagnosis not present

## 2018-10-14 DIAGNOSIS — I10 Essential (primary) hypertension: Secondary | ICD-10-CM | POA: Diagnosis not present

## 2018-10-21 DIAGNOSIS — I10 Essential (primary) hypertension: Secondary | ICD-10-CM | POA: Diagnosis not present

## 2018-10-28 DIAGNOSIS — I10 Essential (primary) hypertension: Secondary | ICD-10-CM | POA: Diagnosis not present

## 2018-11-01 DIAGNOSIS — M545 Low back pain: Secondary | ICD-10-CM | POA: Diagnosis not present

## 2018-11-01 DIAGNOSIS — I10 Essential (primary) hypertension: Secondary | ICD-10-CM | POA: Diagnosis not present

## 2018-11-01 DIAGNOSIS — M1990 Unspecified osteoarthritis, unspecified site: Secondary | ICD-10-CM | POA: Diagnosis not present

## 2018-11-11 DIAGNOSIS — I10 Essential (primary) hypertension: Secondary | ICD-10-CM | POA: Diagnosis not present

## 2018-11-15 DIAGNOSIS — M1990 Unspecified osteoarthritis, unspecified site: Secondary | ICD-10-CM | POA: Diagnosis not present

## 2018-11-15 DIAGNOSIS — M545 Low back pain: Secondary | ICD-10-CM | POA: Diagnosis not present

## 2018-11-18 DIAGNOSIS — I10 Essential (primary) hypertension: Secondary | ICD-10-CM | POA: Diagnosis not present

## 2018-11-25 DIAGNOSIS — I10 Essential (primary) hypertension: Secondary | ICD-10-CM | POA: Diagnosis not present

## 2018-11-29 DIAGNOSIS — M1990 Unspecified osteoarthritis, unspecified site: Secondary | ICD-10-CM | POA: Diagnosis not present

## 2018-11-29 DIAGNOSIS — M545 Low back pain: Secondary | ICD-10-CM | POA: Diagnosis not present

## 2018-12-01 DIAGNOSIS — I10 Essential (primary) hypertension: Secondary | ICD-10-CM | POA: Diagnosis not present

## 2018-12-06 DIAGNOSIS — M1990 Unspecified osteoarthritis, unspecified site: Secondary | ICD-10-CM | POA: Diagnosis not present

## 2018-12-06 DIAGNOSIS — N170 Acute kidney failure with tubular necrosis: Secondary | ICD-10-CM | POA: Diagnosis not present

## 2018-12-06 DIAGNOSIS — E782 Mixed hyperlipidemia: Secondary | ICD-10-CM | POA: Diagnosis not present

## 2018-12-06 DIAGNOSIS — M545 Low back pain: Secondary | ICD-10-CM | POA: Diagnosis not present

## 2018-12-06 DIAGNOSIS — E118 Type 2 diabetes mellitus with unspecified complications: Secondary | ICD-10-CM | POA: Diagnosis not present

## 2018-12-06 DIAGNOSIS — D649 Anemia, unspecified: Secondary | ICD-10-CM | POA: Diagnosis not present

## 2018-12-06 DIAGNOSIS — E785 Hyperlipidemia, unspecified: Secondary | ICD-10-CM | POA: Diagnosis not present

## 2018-12-06 DIAGNOSIS — E039 Hypothyroidism, unspecified: Secondary | ICD-10-CM | POA: Diagnosis not present

## 2018-12-09 DIAGNOSIS — I10 Essential (primary) hypertension: Secondary | ICD-10-CM | POA: Diagnosis not present

## 2018-12-13 DIAGNOSIS — M1990 Unspecified osteoarthritis, unspecified site: Secondary | ICD-10-CM | POA: Diagnosis not present

## 2018-12-13 DIAGNOSIS — I10 Essential (primary) hypertension: Secondary | ICD-10-CM | POA: Diagnosis not present

## 2018-12-13 DIAGNOSIS — Z20828 Contact with and (suspected) exposure to other viral communicable diseases: Secondary | ICD-10-CM | POA: Diagnosis not present

## 2018-12-13 DIAGNOSIS — M545 Low back pain: Secondary | ICD-10-CM | POA: Diagnosis not present

## 2018-12-13 DIAGNOSIS — N170 Acute kidney failure with tubular necrosis: Secondary | ICD-10-CM | POA: Diagnosis not present

## 2018-12-16 DIAGNOSIS — I10 Essential (primary) hypertension: Secondary | ICD-10-CM | POA: Diagnosis not present

## 2018-12-20 DIAGNOSIS — M545 Low back pain: Secondary | ICD-10-CM | POA: Diagnosis not present

## 2018-12-20 DIAGNOSIS — M1990 Unspecified osteoarthritis, unspecified site: Secondary | ICD-10-CM | POA: Diagnosis not present

## 2018-12-23 DIAGNOSIS — I10 Essential (primary) hypertension: Secondary | ICD-10-CM | POA: Diagnosis not present

## 2018-12-30 DIAGNOSIS — I10 Essential (primary) hypertension: Secondary | ICD-10-CM | POA: Diagnosis not present

## 2019-01-01 DIAGNOSIS — I10 Essential (primary) hypertension: Secondary | ICD-10-CM | POA: Diagnosis not present

## 2019-01-06 DIAGNOSIS — I10 Essential (primary) hypertension: Secondary | ICD-10-CM | POA: Diagnosis not present

## 2019-01-13 DIAGNOSIS — I10 Essential (primary) hypertension: Secondary | ICD-10-CM | POA: Diagnosis not present

## 2019-01-17 DIAGNOSIS — A539 Syphilis, unspecified: Secondary | ICD-10-CM | POA: Diagnosis not present

## 2019-01-17 DIAGNOSIS — E876 Hypokalemia: Secondary | ICD-10-CM | POA: Diagnosis not present

## 2019-01-17 DIAGNOSIS — N170 Acute kidney failure with tubular necrosis: Secondary | ICD-10-CM | POA: Diagnosis not present

## 2019-01-17 DIAGNOSIS — I6349 Cerebral infarction due to embolism of other cerebral artery: Secondary | ICD-10-CM | POA: Diagnosis not present

## 2019-01-20 DIAGNOSIS — I10 Essential (primary) hypertension: Secondary | ICD-10-CM | POA: Diagnosis not present

## 2019-01-21 DIAGNOSIS — Z20828 Contact with and (suspected) exposure to other viral communicable diseases: Secondary | ICD-10-CM | POA: Diagnosis not present

## 2019-01-27 DIAGNOSIS — I10 Essential (primary) hypertension: Secondary | ICD-10-CM | POA: Diagnosis not present

## 2019-02-01 DIAGNOSIS — I10 Essential (primary) hypertension: Secondary | ICD-10-CM | POA: Diagnosis not present

## 2019-02-03 DIAGNOSIS — I6349 Cerebral infarction due to embolism of other cerebral artery: Secondary | ICD-10-CM | POA: Diagnosis not present

## 2019-02-03 DIAGNOSIS — N170 Acute kidney failure with tubular necrosis: Secondary | ICD-10-CM | POA: Diagnosis not present

## 2019-02-03 DIAGNOSIS — E782 Mixed hyperlipidemia: Secondary | ICD-10-CM | POA: Diagnosis not present

## 2019-02-03 DIAGNOSIS — A539 Syphilis, unspecified: Secondary | ICD-10-CM | POA: Diagnosis not present

## 2019-03-17 ENCOUNTER — Telehealth: Payer: Self-pay

## 2019-03-17 ENCOUNTER — Telehealth: Payer: Self-pay | Admitting: Licensed Clinical Social Worker

## 2019-03-17 ENCOUNTER — Ambulatory Visit: Payer: Medicaid Other | Attending: Family Medicine | Admitting: Family Medicine

## 2019-03-17 ENCOUNTER — Encounter: Payer: Self-pay | Admitting: Family Medicine

## 2019-03-17 ENCOUNTER — Other Ambulatory Visit: Payer: Self-pay

## 2019-03-17 VITALS — BP 187/105 | HR 79 | Temp 98.0°F | Ht 69.0 in | Wt 271.8 lb

## 2019-03-17 DIAGNOSIS — L602 Onychogryphosis: Secondary | ICD-10-CM | POA: Diagnosis not present

## 2019-03-17 DIAGNOSIS — Z8639 Personal history of other endocrine, nutritional and metabolic disease: Secondary | ICD-10-CM | POA: Diagnosis not present

## 2019-03-17 DIAGNOSIS — I739 Peripheral vascular disease, unspecified: Secondary | ICD-10-CM

## 2019-03-17 DIAGNOSIS — Z8673 Personal history of transient ischemic attack (TIA), and cerebral infarction without residual deficits: Secondary | ICD-10-CM | POA: Diagnosis not present

## 2019-03-17 DIAGNOSIS — I6523 Occlusion and stenosis of bilateral carotid arteries: Secondary | ICD-10-CM

## 2019-03-17 DIAGNOSIS — R269 Unspecified abnormalities of gait and mobility: Secondary | ICD-10-CM

## 2019-03-17 DIAGNOSIS — Z79899 Other long term (current) drug therapy: Secondary | ICD-10-CM

## 2019-03-17 DIAGNOSIS — K089 Disorder of teeth and supporting structures, unspecified: Secondary | ICD-10-CM

## 2019-03-17 DIAGNOSIS — R351 Nocturia: Secondary | ICD-10-CM | POA: Diagnosis not present

## 2019-03-17 DIAGNOSIS — I69398 Other sequelae of cerebral infarction: Secondary | ICD-10-CM

## 2019-03-17 DIAGNOSIS — I129 Hypertensive chronic kidney disease with stage 1 through stage 4 chronic kidney disease, or unspecified chronic kidney disease: Secondary | ICD-10-CM | POA: Diagnosis not present

## 2019-03-17 DIAGNOSIS — N182 Chronic kidney disease, stage 2 (mild): Secondary | ICD-10-CM | POA: Diagnosis not present

## 2019-03-17 DIAGNOSIS — I160 Hypertensive urgency: Secondary | ICD-10-CM | POA: Diagnosis not present

## 2019-03-17 DIAGNOSIS — Z7902 Long term (current) use of antithrombotics/antiplatelets: Secondary | ICD-10-CM

## 2019-03-17 MED ORDER — LISINOPRIL-HYDROCHLOROTHIAZIDE 20-25 MG PO TABS: 2.0000 | ORAL_TABLET | Freq: Every day | ORAL | 2 refills | Status: DC

## 2019-03-17 MED ORDER — CLOPIDOGREL BISULFATE 75 MG PO TABS: 75.0000 mg | ORAL_TABLET | Freq: Every day | ORAL | Status: DC

## 2019-03-17 MED ORDER — ATORVASTATIN CALCIUM 40 MG PO TABS: 40.0000 mg | ORAL_TABLET | Freq: Every day | ORAL | 2 refills | Status: DC

## 2019-03-17 MED ORDER — AMLODIPINE BESYLATE 5 MG PO TABS: 5.0000 mg | ORAL_TABLET | Freq: Every day | ORAL | 2 refills | Status: DC

## 2019-03-17 NOTE — Telephone Encounter (Signed)
Completed SCAT application faxed to Eligibility Department. 

## 2019-03-17 NOTE — Telephone Encounter (Signed)
Met with the patient and his mother when they were in the clinic today for his appointment. He is currently living alone in a first floor apartment. Has RW.  He had been living with his mother, Lawerance Bach,  after his discharge from [facility] until about 2 weeks ago. His mother knows a man  that checks on the patient frequently during the day and will even spend the night with patient if he is able.  They are interested in [facility]  - application complete.  They are also interested in [facility] .  Explained the differences and that [facility] has a wait list.  Provided them with the phone number for [facility] to call and request an application.   [name]'s granddaughter lives in the same apartment complex as patient. She is a Web designer and will be able to fill/manage patient's medications. His mother will pick up meds at local pharmacy as she stated that it is near her office. Patient stated and mother confirmed that he will remember to take the medications as scheduled as long as they are set up in the med boxes that he has.   Patient was provided with SCAT application that he will complete prior to leaving the clinic

## 2019-03-17 NOTE — Progress Notes (Signed)
Subjective:  Patient ID: Robert Donovan, male    DOB: 06-Apr-1956  Age: 63 y.o. MRN: [id number]  CC: No chief complaint on file.   HPI Robert Donovan, 63 year old male, with medical history significant for CVA, hypertension, hyperlipidemia, morbid obesity, osteoarthritis, chronic kidney disease and history of past syphilis infections, who in the past was followed at [facility], who is status post hospitalization from 02/10/2018 through 02/18/2018 after being sent to the emergency department by his primary care provider due to a positive blood test for syphilis and patient with alteration in mental status.  Patient also reported in the emergency department that he had developed onset of difficulty with speech, memory, swallowing and difficulty lifting his right foot which had caused the patient to have several falls.  Patient had imaging which showed multifocal left-sided embolic CVAs in addition to chronic ischemic changes and microhemorrhages.  CT angio of the head showed moderate distal basilar and left P1 segment stenosis and up to 50% carotid siphon narrowing bilaterally.  Patient had infectious disease consult during hospitalization for positive syphilis blood test but patient was not thought to have neurosyphilis.  Patient was discharged to an assisted living facility but did not wish to remain there and patient's mother who is with him at today's visit, is arranging for patient to be able to live in an apartment of his own.  Prior to hospitalization, patient had issues with homelessness.  Mother reports that there are multiple things that the patient now needs including an assistant to help patient with self-care as well as application for scat transportation services and mother has a list of other issues or items that she would like to have addressed for the patient.  Mother reports that patient was discharged and initially no medications were provided and then they were told that  prescriptions have been sent to patient's pharmacy.  Per mother, patient does not have his medications as she does not have a list from the assisted living facility regarding his current medications but was provided with a hospital discharge summary from 2019.  Mother states that she is not sure exactly which medicines patient is supposed to be taking and that he is currently out of medications mother states that she does have some of the pill bottles with her at today's visit.  Patient reports that he feels well.  He has had some occasional headaches since being out of his blood pressure medication.  He reports that he is able to walk with the use of a walker however his right foot tends to drag.  He denies any current issues with swallowing.  He has had no further episodes of focal numbness or weakness since his hospitalization.  He does have issues with his teeth and needs a referral to a dentist.  He denies any current abdominal pain-no nausea/vomiting/diarrhea or constipation and no blood in the stool or black stools.  He is taking daily aspirin and denies any reflux issues.  He has some urinary frequency, which generally occurs at night and gets up from 1-2 times at night to urinate occasionally more often depending on how much water he has had to drink.  He denies issues with having to strain to initiate his urinary stream.  He has some occasional lower leg swelling.  Mother reports that patient has also complained of thickened, overgrown toenails which are painful and mother would like patient to be referred to podiatry for further evaluation.  Past Medical History:  Diagnosis Date  .  Arthritis   . High cholesterol   . Hypertension     Past Surgical History:  Procedure Laterality Date  . TEE WITHOUT CARDIOVERSION N/A 02/16/2018   Procedure: TRANSESOPHAGEAL ECHOCARDIOGRAM (TEE);  Surgeon: Jodelle Red, MD;  Location: [facility] ENDOSCOPY;  Service: Cardiovascular;  Laterality: N/A;    Family  History  Problem Relation Age of Onset  . Healthy Mother   . Heart attack Father   . Diabetes Brother     Social History   Tobacco Use  . Smoking status: Former Games developer  . Smokeless tobacco: Never Used  Substance Use Topics  . Alcohol use: Not Currently    ROS Review of Systems  Constitutional: Positive for fatigue (mild). Negative for chills and fever.  HENT: Negative for sore throat and trouble swallowing.   Eyes: Negative for photophobia and visual disturbance.  Respiratory: Negative for cough and shortness of breath.   Cardiovascular: Positive for leg swelling (occasional). Negative for chest pain and palpitations.  Gastrointestinal: Negative for abdominal pain, blood in stool, constipation, diarrhea and nausea.  Endocrine: Positive for polyuria. Negative for cold intolerance, heat intolerance, polydipsia and polyphagia.  Genitourinary: Positive for frequency. Negative for dysuria.  Musculoskeletal: Positive for arthralgias (occasional knee pain) and gait problem.  Neurological: Positive for weakness and headaches. Negative for dizziness.       Mother has concerns regarding patient with possible memory issues  Hematological: Negative for adenopathy. Does not bruise/bleed easily.  Psychiatric/Behavioral: Negative for self-injury, sleep disturbance and suicidal ideas. The patient is not nervous/anxious.        Mother with concerns that patient seems to get upset easily    Objective:   Today's Vitals: BP (!) 187/100 (BP Location: Left Arm, Patient Position: Sitting, Cuff Size: Large)   Pulse 79   Temp 98 F (36.7 C) (Oral)   Ht 5\' 9"  (1.753 m)   Wt 271 lb 12.8 oz (123.3 kg)   SpO2 97%   BMI 40.14 kg/m   Physical Exam Vitals signs and nursing note reviewed.  Constitutional:      General: He is not in acute distress.    Appearance: Normal appearance. He is obese. He is not ill-appearing.     Comments: Well-nourished well-developed obese male in no acute distress  sitting on exam table with walker nearby who is accompanied by his mother  Neck:     Musculoskeletal: Normal range of motion and neck supple.     Vascular: No carotid bruit (No carotid bruits appreciated on exam).  Cardiovascular:     Rate and Rhythm: Normal rate and regular rhythm.  Pulmonary:     Effort: Pulmonary effort is normal.     Breath sounds: Normal breath sounds.  Abdominal:     Palpations: Abdomen is soft.     Tenderness: There is no abdominal tenderness. There is no right CVA tenderness, left CVA tenderness, guarding or rebound.     Comments: Truncal obesity  Musculoskeletal:        General: No tenderness.     Right lower leg: No edema.     Left lower leg: No edema.  Lymphadenopathy:     Cervical: No cervical adenopathy.  Skin:    General: Skin is warm and dry.  Neurological:     Mental Status: He is alert and oriented to person, place, and time.     Motor: Weakness present.     Gait: Gait abnormal.     Comments: Patient with slight hesitancy of speech/mild dysarthria.  Patient has  5/5 bilateral upper extremity strength.  Patient with 4/5 strength with right hip flexion and patient with 3 out of 5 right ankle strength/ability to dorsiflex  Psychiatric:        Mood and Affect: Mood normal.        Behavior: Behavior normal.     Comments: Patient seems subdued versus slightly flattened affect; mother does most of the talking and even attempts to answer for patient at times     Assessment & Plan:  1. Hypertensive urgency 2. 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  Mother and patient initially stated that patient did not have his medications but had medication bottles.  When I reviewed the medications, patient actually had medication within the bottles and patient was given water and allowed to take his blood pressure medication while in the office.  New prescriptions are also being sent in for the patient.   Discussed with patient and mother that if patient with issues with being able to recall taking medications that pill planner should be made for the patient as well as a reminder system. Patient will have labs in follow-up of his hypertension, cerebral atherosclerosis, microvascular disease, hyperlipidemia, and chronic kidney disease.   - Comprehensive metabolic panel - Urinalysis, Complete - Lipid Panel - lisinopril-hydrochlorothiazide (ZESTORETIC) 20-25 MG tablet; Take 2 tablets by mouth daily. To lower blood pressure  Dispense: 60 tablet; Refill: 2 - amLODipine (NORVASC) 5 MG tablet; Take 1 tablet (5 mg total) by mouth daily. To lower blood pressure  Dispense: 30 tablet; Refill: 2  3. Carotid atherosclerosis, bilateral; 4. History of CVA with residual deficets; 5. PVD Patient with abnormal CT angiogram showing cerebral and carotid atherosclerosis. Continue secondary prevention methods of controlling blood pressure, lipids and continuing blood thinning medication. Referral to Neurology in follow-up of embolic CVA's and social work consult to help with on-going care needs. Will also have case manager for this office meet with patient and mother to help with any needed resources.  - Comprehensive metabolic panel - Lipid Panel - Ambulatory referral to Neurology - clopidogrel (PLAVIX) 75 MG tablet; Take 1 tablet (75 mg total) by mouth daily.  Dispense:   - atorvastatin (LIPITOR) 40 MG tablet; Take 1 tablet (40 mg total) by mouth daily. To lower cholesterol  Dispense: 30 tablet; Refill: 2 - Ambulatory referral to Social Work - Ambulatory referral to Podiatry    6. Poor dentition Dental referral placed due to patient's poor dentition but does not appear to have active infection at this time - Ambulatory referral to Dentistry  7. Thickened nails Podiatry referral as patient with thickened, overgrown painful toenails - Ambulatory referral to Podiatry  8. Abnormality of gait as late effect of  cerebrovascular accident (CVA) Continue use of walker to help prevent falls and improve gait. Podiatry referral for nail care - Ambulatory referral to Podiatry  9. History of elevated glucose On review of chart, patient has had some past elevated blood sugar levels and will have hemoglobin A1c at today's visit to see if he may have diabetes or prediabetes this patient also with some complaints of urinary frequency, particularly at night. - Hemoglobin A1C  10. Nocturia Will obtain PSA and follow-up of patient's complaints of nocturia and as a screening test for prostate cancer as patient is at increased risk of prostate cancer due to age and ethnicity - PSA  51. Encounter for long-term (current) use of medications Comprehensive metabolic panel in follow-up of long-term  use of medications for treatment of hypertension and hyperlipidemia - Comprehensive metabolic panel  12. Long term (current) use of antithrombotics/antiplatelets CBC in follow-up of long-term use of antiplatelet/antithrombotic medication to look for any platelet issues or issues with anemia which may require further evaluation - CBC with Differential  Outpatient Encounter Medications as of 03/17/2019  Medication Sig  . acetaminophen-codeine (TYLENOL #3) 300-30 MG tablet Take 1 tablet by mouth every 8 (eight) hours as needed for moderate pain.  Marland Kitchen aspirin EC 81 MG tablet Take 1 tablet (81 mg total) by mouth daily.  Marland Kitchen atorvastatin (LIPITOR) 40 MG tablet Take 1 tablet (40 mg total) by mouth daily.  . clopidogrel (PLAVIX) 75 MG tablet Take 1 tablet (75 mg total) by mouth daily.  Marland Kitchen lisinopril-hydrochlorothiazide (PRINZIDE,ZESTORETIC) 20-25 MG tablet Take 1 tablet by mouth daily.   No facility-administered encounter medications on file as of 03/17/2019.    An After Visit Summary was printed and given to the patient.   Follow-up: Return in about 2 weeks (around 03/31/2019) for HTN/chronic issues.  Cain Saupe MD

## 2019-03-17 NOTE — Patient Instructions (Signed)

## 2019-03-18 ENCOUNTER — Telehealth: Payer: Self-pay

## 2019-03-18 LAB — CBC WITH DIFFERENTIAL/PLATELET
Basophils Absolute: 0.1 x10E3/uL (ref 0.0–0.2)
Basos: 1 %
EOS (ABSOLUTE): 0.2 x10E3/uL (ref 0.0–0.4)
Eos: 2 %
Hematocrit: 43.5 % (ref 37.5–51.0)
Hemoglobin: 14 g/dL (ref 13.0–17.7)
Immature Grans (Abs): 0.1 x10E3/uL (ref 0.0–0.1)
Immature Granulocytes: 1 %
Lymphocytes Absolute: 2.8 x10E3/uL (ref 0.7–3.1)
Lymphs: 30 %
MCH: 27.8 pg (ref 26.6–33.0)
MCHC: 32.2 g/dL (ref 31.5–35.7)
MCV: 87 fL (ref 79–97)
Monocytes Absolute: 0.7 x10E3/uL (ref 0.1–0.9)
Monocytes: 7 %
Neutrophils Absolute: 5.8 x10E3/uL (ref 1.4–7.0)
Neutrophils: 59 %
Platelets: 225 x10E3/uL (ref 150–450)
RBC: 5.03 x10E6/uL (ref 4.14–5.80)
RDW: 13.9 % (ref 11.6–15.4)
WBC: 9.6 x10E3/uL (ref 3.4–10.8)

## 2019-03-18 LAB — HEMOGLOBIN A1C
Est. average glucose Bld gHb Est-mCnc: 94 mg/dL
Hgb A1c MFr Bld: 4.9 % (ref 4.8–5.6)

## 2019-03-18 LAB — COMPREHENSIVE METABOLIC PANEL WITH GFR
ALT: 7 IU/L (ref 0–44)
AST: 12 IU/L (ref 0–40)
Albumin/Globulin Ratio: 1.6 (ref 1.2–2.2)
Albumin: 4.2 g/dL (ref 3.8–4.8)
Alkaline Phosphatase: 89 IU/L (ref 39–117)
BUN/Creatinine Ratio: 16 (ref 10–24)
BUN: 16 mg/dL (ref 8–27)
Bilirubin Total: 1 mg/dL (ref 0.0–1.2)
CO2: 22 mmol/L (ref 20–29)
Calcium: 9.6 mg/dL (ref 8.6–10.2)
Chloride: 106 mmol/L (ref 96–106)
Creatinine, Ser: 1.02 mg/dL (ref 0.76–1.27)
GFR calc Af Amer: 91 mL/min/1.73
GFR calc non Af Amer: 79 mL/min/1.73
Globulin, Total: 2.7 g/dL (ref 1.5–4.5)
Glucose: 91 mg/dL (ref 65–99)
Potassium: 4.5 mmol/L (ref 3.5–5.2)
Sodium: 142 mmol/L (ref 134–144)
Total Protein: 6.9 g/dL (ref 6.0–8.5)

## 2019-03-18 LAB — LIPID PANEL
Chol/HDL Ratio: 3 ratio (ref 0.0–5.0)
Cholesterol, Total: 143 mg/dL (ref 100–199)
HDL: 48 mg/dL
LDL Chol Calc (NIH): 82 mg/dL (ref 0–99)
Triglycerides: 66 mg/dL (ref 0–149)
VLDL Cholesterol Cal: 13 mg/dL (ref 5–40)

## 2019-03-18 LAB — PSA: Prostate Specific Ag, Serum: 0.6 ng/mL (ref 0.0–4.0)

## 2019-03-18 NOTE — Telephone Encounter (Signed)
Referral for PCS faxed to [facility] 

## 2019-03-22 ENCOUNTER — Telehealth: Payer: Self-pay | Admitting: Physician Assistant

## 2019-03-22 NOTE — Telephone Encounter (Signed)
New Message   Pt is calling to be told his labs results again and to also request his mother be called as well. Please f/u

## 2019-03-22 NOTE — Telephone Encounter (Signed)
Spoke with patient and he gived verbal to call his mother and informed her with recent results due to his stroke. Spoke with patient mother and informed her with results and she verbalized understanding.

## 2019-03-28 ENCOUNTER — Encounter: Payer: Self-pay | Admitting: Podiatry

## 2019-03-28 ENCOUNTER — Ambulatory Visit: Payer: Medicaid Other

## 2019-03-28 ENCOUNTER — Other Ambulatory Visit: Payer: Self-pay

## 2019-03-28 ENCOUNTER — Ambulatory Visit: Payer: MEDICAID | Admitting: Podiatry

## 2019-03-28 DIAGNOSIS — B351 Tinea unguium: Secondary | ICD-10-CM | POA: Diagnosis not present

## 2019-03-28 DIAGNOSIS — M79674 Pain in right toe(s): Secondary | ICD-10-CM

## 2019-03-28 DIAGNOSIS — M79675 Pain in left toe(s): Secondary | ICD-10-CM

## 2019-03-28 DIAGNOSIS — M25571 Pain in right ankle and joints of right foot: Secondary | ICD-10-CM

## 2019-03-28 DIAGNOSIS — D689 Coagulation defect, unspecified: Secondary | ICD-10-CM

## 2019-03-28 NOTE — Progress Notes (Signed)
   Subjective:    Patient ID: Robert Donovan, male    DOB: 1955/06/22, 63 y.o.   MRN: [id number]  HPI    Review of Systems  All other systems reviewed and are negative.      Objective:   Physical Exam        Assessment & Plan:

## 2019-03-30 ENCOUNTER — Encounter: Payer: Self-pay | Admitting: Adult Health

## 2019-03-30 ENCOUNTER — Ambulatory Visit: Payer: Medicaid Other | Admitting: Adult Health

## 2019-03-30 ENCOUNTER — Other Ambulatory Visit: Payer: Self-pay

## 2019-03-30 VITALS — BP 144/89 | HR 78 | Temp 78.0°F | Ht 69.0 in | Wt 264.0 lb

## 2019-03-30 DIAGNOSIS — I10 Essential (primary) hypertension: Secondary | ICD-10-CM | POA: Diagnosis not present

## 2019-03-30 DIAGNOSIS — I639 Cerebral infarction, unspecified: Secondary | ICD-10-CM

## 2019-03-30 DIAGNOSIS — E785 Hyperlipidemia, unspecified: Secondary | ICD-10-CM | POA: Diagnosis not present

## 2019-03-30 DIAGNOSIS — R29898 Other symptoms and signs involving the musculoskeletal system: Secondary | ICD-10-CM | POA: Diagnosis not present

## 2019-03-30 NOTE — Progress Notes (Signed)
Subjective:   Patient ID: Robert Donovan, male   DOB: 63 y.o.   MRN: [id number]   HPI Patient presents with caregiver with severely dystrophic thickened nails 1-5 both feet and patient being on blood thinner being high risk for problems associated with this.  Patient is not communicative and apparently does not smoke currently and is not active   Review of Systems  All other systems reviewed and are negative.       Objective:  Physical Exam Vitals signs and nursing note reviewed.  Constitutional:      Appearance: He is well-developed.  Pulmonary:     Effort: Pulmonary effort is normal.  Musculoskeletal: Normal range of motion.  Skin:    General: Skin is warm.  Neurological:     Mental Status: He is alert.     Neurovascular status was found to be intact with diminished sharp dull vibratory of a mild nature.  Diminished range of motion subtalar midtarsal joint found to be present with patient found to have severely thickened dystrophic nailbeds 1-5 both feet that are hard for him to wear shoe gear with and they are irritative and he does have blood thinners which make him a difficult risk for debridement     Assessment:  High risk patient with mycotic nail infection and dermatitis bilateral     Plan:  H&P reviewed all conditions and discussed with caregiver.  At this point I did debride all nailbeds with no iatrogenic bleeding and this can be done on an as-needed basis

## 2019-03-30 NOTE — Patient Instructions (Signed)
Referral placed to cardiology to have 30-day cardiac event monitor to assess for irregular heart rhythm that could have caused your prior strokes  Continue aspirin 81 mg daily  and Lipitor for secondary stroke prevention  Continue to follow up with PCP regarding cholesterol and blood pressure management   Continue to monitor blood pressure at home  Maintain strict control of hypertension with blood pressure goal below 130/90, diabetes with hemoglobin A1c goal below 6.5% and cholesterol with LDL cholesterol (bad cholesterol) goal below 70 mg/dL. I also advised the patient to eat a healthy diet with plenty of whole grains, cereals, fruits and vegetables, exercise regularly and maintain ideal body weight.  Followup in the future with me in 3 months or call earlier if needed       Thank you for coming to see [place] at [facility]. I hope we have been able to provide you high quality care today.  You may receive a patient satisfaction survey over the next few weeks. We would appreciate your feedback and comments so that we may continue to improve ourselves and the health of our patients.

## 2019-03-30 NOTE — Progress Notes (Signed)
[name] [address] [place]. [place] [address] [phone]       HOSPITAL FOLLOW UP NOTE  [name] Date of Birth:  04-27-1956 Medical Record Number:  [id number]   Reason for Referral:  hospital stroke follow up    CHIEF COMPLAINT:  Chief Complaint  Patient presents with   Cerebrovascular Accident    stroke 2019 - referral by PCP    HPI: Robert Donovan being seen today for in office hospital follow-up regarding multiple embolic strokes secondary to unknown source on 02/12/2018.  History obtained from patient, wife and chart review. Reviewed all radiology images and labs personally.  [name] is a 63 y.o. male with history of previous stroke, hyperlipidemia, remote syphilis, hypertension  and multiple embolic strokes on 02/12/2018.  He was referred to our office by his PCP as he did not have follow-up post hospital discharge.  He presented to ED with slurred speech, follows and dragging of left foot.  MRI head showed multiple areas of acute infarct in the left anterior cerebral artery and left middle cerebral artery territory suggesting emboli along with chronic bilateral pontine, left thalamic and right ACA punctate infarcts.  CTA head/neck showed 50% carotic siphon narrowing bilaterally along with distal basilar and left P1 moderate stenosis.  2D echo unremarkable.  Lower extremity venous Dopplers negative for DVT.  TEE negative for PFO or cardiac source of emboli.  LDL 76.  A1c 5.3.  Hypercoagulable and autoimmune work-up negative.  Recommended DAPT for 3 weeks then Plavix alone as previously on aspirin.  History of syphilis and gonorrhea with reports of penicillin treatment.  HTN stable.  Recommend continuation of atorvastatin 40 mg daily.  Other stroke risk factors include former tobacco use, EtOH use, obesity and prior stroke on imaging.  Recommended 30-day cardiac event monitor outpatient to rule out atrial fibrillation.  Residual deficits of  right facial droop, mild right grip weakness and right leg monoplegia and discharged to SNF for ongoing therapy.  Robert Donovan is a 63 year old male who was referred to our office to follow-up regarding prior stroke because he did not have hospital discharge follow-up.  He has recently been discharged from nursing home and currently living in a apartment.  He does have supervision by a family friend and mother is in the process of acquiring medical aid to assist with medications, bathing and dressing.  He has difficulty with bathing and dressing independently due to residual stroke deficits.  Residual mild RLE weakness with great improvement from hospital discharge.  He continues to use rolling walker to assist with ambulation and denies any recent falls.  He also has residual dysarthria with speech hesitancy but denies cognitive or memory impairment.  His mother is concerned regarding his mood with frequent outbursts.  Patient states this is because his family is constantly telling him what he should and should not be doing and this frustrates him.  He denies depression or anxiety.  He has continued on aspirin and Plavix despite 3-week DAPT duration but denies bleeding or bruising.  Continues on atorvastatin without myalgias. Recent lipid panel obtained by PCP on 03/17/2019 showed LDL 82.  Also obtain A1c at 4.9.  Blood pressure today 144/89.  Patient and mother had multiple questions regarding diagnosis of prior syphilis infection during hospitalization and if syphilis causes stroke.  Neurosyphilis ruled out during admission with CSF which was discussed with patient.  Recommended to undergo 30-day cardiac event monitor to rule out A. fib at discharge but  this is not been completed.  No further concerns at this time.   ROS:   14 system review of systems performed and negative with exception of weakness  PMH:  Past Medical History:  Diagnosis Date   Arthritis    High cholesterol    Hypertension      PSH:  Past Surgical History:  Procedure Laterality Date   TEE WITHOUT CARDIOVERSION N/A 02/16/2018   Procedure: TRANSESOPHAGEAL ECHOCARDIOGRAM (TEE);  Surgeon: Jodelle Red, MD;  Location: [facility] ENDOSCOPY;  Service: Cardiovascular;  Laterality: N/A;    Social History:  Social History   Socioeconomic History   Marital status: Single    Spouse name: Not on file   Number of children: Not on file   Years of education: Not on file   Highest education level: Not on file  Occupational History   Not on file  Social Needs   Financial resource strain: Not on file   Food insecurity    Worry: Not on file    Inability: Not on file   Transportation needs    Medical: Not on file    Non-medical: Not on file  Tobacco Use   Smoking status: Former Smoker   Smokeless tobacco: Never Used  Substance and Sexual Activity   Alcohol use: Not Currently   Drug use: Not Currently    Comment: hx marijuana use   Sexual activity: Not Currently  Lifestyle   Physical activity    Days per week: Not on file    Minutes per session: Not on file   Stress: Not on file  Relationships   Social connections    Talks on phone: Not on file    Gets together: Not on file    Attends religious service: Not on file    Active member of club or organization: Not on file    Attends meetings of clubs or organizations: Not on file    Relationship status: Not on file   Intimate partner violence    Fear of current or ex partner: Not on file    Emotionally abused: Not on file    Physically abused: Not on file    Forced sexual activity: Not on file  Other Topics Concern   Not on file  Social History Narrative   Not on file    Family History:  Family History  Problem Relation Age of Onset   Healthy Mother    Heart attack Father    Diabetes Brother     Medications:   Current Outpatient Medications on File Prior to Visit  Medication Sig Dispense Refill   amLODipine (NORVASC)  5 MG tablet Take 1 tablet (5 mg total) by mouth daily. To lower blood pressure 30 tablet 2   aspirin EC 81 MG tablet Take 1 tablet (81 mg total) by mouth daily. 90 tablet 3   atorvastatin (LIPITOR) 40 MG tablet Take 1 tablet (40 mg total) by mouth daily. To lower cholesterol 30 tablet 2   clopidogrel (PLAVIX) 75 MG tablet Take 1 tablet (75 mg total) by mouth daily.     lisinopril-hydrochlorothiazide (ZESTORETIC) 20-25 MG tablet Take 2 tablets by mouth daily. To lower blood pressure 60 tablet 2   No current facility-administered medications on file prior to visit.     Allergies:   Allergies  Allergen Reactions   Hydrocodone     Pt stated, "I am not allergic, I do not want to take this medicine ever" Codeine - "I do not want to ever take  this medicine - not allergic"   Oxycodone     Pt stated, "I am not allergic, I do not want to take this medicine ever"     Physical Exam  Vitals:   03/30/19 1119  BP: (!) 144/89  Pulse: 78  Temp: (!) 78 F (25.6 C)  Weight: 264 lb (119.7 kg)  Height: 5\' 9"  (1.753 m)   Body mass index is 38.99 kg/m. No exam data present  Depression screen Robert Donovan 2/9 03/17/2019  Decreased Interest 0  Down, Depressed, Hopeless 0  PHQ - 2 Score 0  Altered sleeping 1  Tired, decreased energy 3  Change in appetite 0  Feeling bad or failure about yourself  0  Trouble concentrating 0  Moving slowly or fidgety/restless 0  Suicidal thoughts 0  PHQ-9 Score 4  Difficult doing work/chores Not difficult at all     General: pleasant obese African American middle aged male, seated, in no evident distress Head: head normocephalic and atraumatic.   Neck: supple with no carotid or supraclavicular bruits Cardiovascular: regular rate and rhythm, no murmurs Musculoskeletal: no deformity Skin:  no rash/petichiae Vascular:  Normal pulses all extremities   Neurologic Exam Mental Status: Awake and fully alert. Mild dysarthria with speech hesitancy. Oriented to place  and time. Recent and remote memory intact. Attention span, concentration and fund of knowledge appropriate. Mood and affect appropriate.  Cranial Nerves: Fundoscopic exam reveals sharp disc margins. Pupils equal, briskly reactive to light. Extraocular movements full without nystagmus. Visual fields full to confrontation. Hearing intact. Facial sensation intact. Face, tongue, palate moves normally and symmetrically.  Motor: Normal bulk and tone. Normal strength in all tested extremity muscles except mild weakness right hip flexor and ankle dorsiflexion weakness. Sensory.: intact to touch , pinprick , position and vibratory sensation.  Coordination: Rapid alternating movements normal in all extremities. Finger-to-nose performed accurately bilaterally and heel-to-shin difficulty performing. Gait and Station: Arises from chair without difficulty. Stance is normal. Abnormal gait with moderate favoring of right leg and use of rolling walker Reflexes: 1+ and symmetric. Toes downgoing.     NIHSS  1 Modified Rankin  2      ASSESSMENT: Robert Donovan is a 63 y.o. year old male with PMH of prior stroke, HTN, HLD, morbid obesity, osteoarthritis and multifocal left-sided embolic strokes on 02/10/2018.  He was referred to our office by his PCP as he did not follow-up in our office at hospital discharge.  Residual deficits of mild RLE weakness and dysarthria.  He has been residing in a nursing home setting since hospital discharge and recently discharged from nursing home currently living in an apartment.  Overall, he has been stable from a stroke standpoint without new or worsening stroke/TIA symptoms.    PLAN:  1. Multifocal embolic strokes: Continue aspirin 81 mg daily  and atorvastatin for secondary stroke prevention.  Discontinue Plavix and continue aspirin alone as no indication for long-term DAPT.  Maintain strict control of hypertension with blood pressure goal below 130/90, diabetes with hemoglobin  A1c goal below 6.5% and cholesterol with LDL cholesterol (bad cholesterol) goal below 70 mg/dL.  I also advised the patient to eat a healthy diet with plenty of whole grains, cereals, fruits and vegetables, exercise regularly with at least 30 minutes of continuous activity daily and maintain ideal body weight.  Referral placed to cardiology to have a 30-day cardiac event monitor to rule out atrial fibrillation as potential cause of stroke.  Long discussion with patient and mother regarding unknown  stroke etiology and neurosyphilis ruled out.  Mother questioning initiation of medication to help with outbursts.  No indication of depression or anxiety likely due to frustration with loss of independence.  Encourage slowly increasing his independence and hopefully this will improve as he is now living in his own apartment.  No indication to initiate antidepressant/anxiety medication at this time.  Advised to follow-up with PCP if worsens or for potential future need of management 2. HTN: Advised to continue current treatment regimen.  Today's BP stable.  Advised to continue to monitor at home along with continued follow-up with PCP for management 3. HLD: Advised to continue current treatment regimen along with continued follow-up with PCP for future prescribing and monitoring of lipid panel    Follow up in 3 months or call earlier if needed   Greater than 50% of time during this 45 minute visit was spent on counseling, explanation of diagnosis of multifocal embolic strokes, reviewing risk factor management of HTN, and HLD, planning of further management along with potential future management, and discussion with patient and family answering all questions.    Frann Rider, AGNP-BC  [facility] [address] [place], [place] [address]  Phone [phone] Fax [phone] Note: This document was prepared with digital dictation and possible smart phrase technology. Any  transcriptional errors that result from this process are unintentional.

## 2019-03-31 ENCOUNTER — Telehealth: Payer: Self-pay

## 2019-03-31 NOTE — Telephone Encounter (Signed)
Call placed to [facility], spoke to [place] who confirmed receipt of the referral and stated that the assessment needs to be scheduled.

## 2019-03-31 NOTE — Progress Notes (Signed)
I agree with the above plan 

## 2019-04-01 ENCOUNTER — Ambulatory Visit: Payer: Medicaid Other | Admitting: Family Medicine

## 2019-04-07 ENCOUNTER — Telehealth: Payer: Self-pay | Admitting: Licensed Clinical Social Worker

## 2019-04-07 NOTE — Telephone Encounter (Signed)
Call received from patient inquiring about scheduled appointment. LCSW informed pt that he has a scheduled appt with PCP tomorrow and he will be contacted by nurse whether appointment will be in-person or via telephone.   Pt shared that he spoke with Izora Gala with [facility]) yesterday. Paperwork was mailed to his residence and he has 30 days to complete and return.   Pt has yet to hear from [facility]. LCSW will contact Darrell Jewel to follow up on pt's application. Pt was appreciative for the assistance.    10:53 AM LCSW left message for Ms. Darrell Jewel to return call regarding pt's [facility] application

## 2019-04-08 ENCOUNTER — Other Ambulatory Visit: Payer: Self-pay

## 2019-04-08 ENCOUNTER — Encounter: Payer: Self-pay | Admitting: Family Medicine

## 2019-04-08 ENCOUNTER — Ambulatory Visit: Payer: Medicaid Other | Attending: Family Medicine | Admitting: Family Medicine

## 2019-04-08 ENCOUNTER — Ambulatory Visit (HOSPITAL_BASED_OUTPATIENT_CLINIC_OR_DEPARTMENT_OTHER): Payer: Medicaid Other | Admitting: Licensed Clinical Social Worker

## 2019-04-08 VITALS — BP 137/85 | HR 67 | Temp 98.7°F | Ht 69.0 in | Wt 265.4 lb

## 2019-04-08 DIAGNOSIS — I69398 Other sequelae of cerebral infarction: Secondary | ICD-10-CM | POA: Diagnosis not present

## 2019-04-08 DIAGNOSIS — I10 Essential (primary) hypertension: Secondary | ICD-10-CM | POA: Diagnosis not present

## 2019-04-08 DIAGNOSIS — Z8673 Personal history of transient ischemic attack (TIA), and cerebral infarction without residual deficits: Secondary | ICD-10-CM | POA: Diagnosis not present

## 2019-04-08 DIAGNOSIS — R269 Unspecified abnormalities of gait and mobility: Secondary | ICD-10-CM | POA: Diagnosis not present

## 2019-04-08 DIAGNOSIS — I6523 Occlusion and stenosis of bilateral carotid arteries: Secondary | ICD-10-CM

## 2019-04-08 DIAGNOSIS — Z7189 Other specified counseling: Secondary | ICD-10-CM

## 2019-04-08 NOTE — Progress Notes (Signed)
Patient is here for 2 wks f/u. Previous to this appt, patient called office to see when his appt is. When staff called patient back patient emergency person (mother) picked up. Informed her with appt time and date. Per pt mother, patient called her before this to ask her when the appt was and she told him to call here. Per pt mother, patient is having remembering problems due to the accident and he thinks he can do it on his own and is doing it on his own but there are times where he forgets and calls her and she's not there at his appt for her to inform her what the providers say. Per pt mother, she would like for the providers to please discuss with patient the possibility of signing a living will so she can be able to talk in further details with providers about patient so she can be able to remind him. Staff informed patient mother that it's up to patient to do so and mother agreed and verbalized understanding.

## 2019-04-08 NOTE — Progress Notes (Signed)
Established Patient Office Visit  Subjective:  Patient ID: Robert Donovan, male    DOB: [birth year]  Age: 63 y.o. MRN: [id number]  CC: No chief complaint on file.   HP Robert Donovan, 63 yo male who is a patient at [facility], who I last saw on 03/17/2019 and that time, he presented with a blood pressure of 187/105.  Patient had been recently discharged from an assisted living facility and per his mother, he did not have any of his medications but patient actually had medication bottles with him and patient was asked to take his medication while at his last visit. He has seen podiatry for nail care since his last visit.  He is also status post neurology visit on 03/30/2019 and blood pressure at that visit was 144/89.  He has history of multiple embolic strokes with hospitalization on 02/12/2018.  After hospital discharge, patient went to a assisted living facility but recently returned home to live with his mother.        At today's visit, he reports that he is feeling well.  He is taking all of his medications and has had no issues.  He reports no headaches or dizziness related to his blood pressure.  He is now living in a first-floor apartment.  He states that there is someone who stays with him during the day in case he needs assistance with this person is not trained in patient care.  Patient reports no falls.  He continues to use his walker for ambulation as he has continued issues with weakness at his right ankle since his stroke.  He has difficulty with his right foot dragging which becomes worse when patient is tired.  He has had no further episodes of onset of focal numbness or weakness which could be associated with new stroke.  He denies any chest pain or palpitations, no shortness of breath or cough.  He denies any visual disturbance.  No urinary frequency or dysuria.  No abdominal pain-no nausea/vomiting/diarrhea or constipation.  He denies any peripheral edema.  He has had no blood in the stool or dark  stools.  Past Medical History:  Diagnosis Date  . Arthritis   . High cholesterol   . Hypertension     Past Surgical History:  Procedure Laterality Date  . TEE WITHOUT CARDIOVERSION N/A 02/16/2018   Procedure: TRANSESOPHAGEAL ECHOCARDIOGRAM (TEE);  Surgeon: Jodelle Red, MD;  Location: [facility] ENDOSCOPY;  Service: Cardiovascular;  Laterality: N/A;    Family History  Problem Relation Age of Onset  . Healthy Mother   . Heart attack Father   . Diabetes Brother     Social History   Socioeconomic History  . Marital status: Single    Spouse name: Not on file  . Number of children: Not on file  . Years of education: Not on file  . Highest education level: Not on file  Occupational History  . Not on file  Social Needs  . Financial resource strain: Not on file  . Food insecurity    Worry: Not on file    Inability: Not on file  . Transportation needs    Medical: Not on file    Non-medical: Not on file  Tobacco Use  . Smoking status: Former Games developer  . Smokeless tobacco: Never Used  Substance and Sexual Activity  . Alcohol use: Not Currently  . Drug use: Not Currently    Comment: hx marijuana use  . Sexual activity: Not Currently  Lifestyle  . Physical activity  Days per week: Not on file    Minutes per session: Not on file  . Stress: Not on file  Relationships  . Social Musician on phone: Not on file    Gets together: Not on file    Attends religious service: Not on file    Active member of club or organization: Not on file    Attends meetings of clubs or organizations: Not on file    Relationship status: Not on file  . Intimate partner violence    Fear of current or ex partner: Not on file    Emotionally abused: Not on file    Physically abused: Not on file    Forced sexual activity: Not on file  Other Topics Concern  . Not on file  Social History Narrative  . Not on file    Outpatient Medications Prior to Visit  Medication Sig Dispense  Refill  . amLODipine (NORVASC) 5 MG tablet Take 1 tablet (5 mg total) by mouth daily. To lower blood pressure 30 tablet 2  . aspirin EC 81 MG tablet Take 1 tablet (81 mg total) by mouth daily. 90 tablet 3  . atorvastatin (LIPITOR) 40 MG tablet Take 1 tablet (40 mg total) by mouth daily. To lower cholesterol 30 tablet 2  . clopidogrel (PLAVIX) 75 MG tablet Take 1 tablet (75 mg total) by mouth daily.    Marland Kitchen lisinopril-hydrochlorothiazide (ZESTORETIC) 20-25 MG tablet Take 2 tablets by mouth daily. To lower blood pressure 60 tablet 2   No facility-administered medications prior to visit.     Allergies  Allergen Reactions  . Hydrocodone     Pt stated, "I am not allergic, I do not want to take this medicine ever" Codeine - "I do not want to ever take this medicine - not allergic"  . Oxycodone     Pt stated, "I am not allergic, I do not want to take this medicine ever"    ROS Review of Systems  Constitutional: Positive for fatigue (improving). Negative for chills and fever.  HENT: Negative for sore throat and trouble swallowing.   Eyes: Negative for photophobia and visual disturbance.  Respiratory: Negative for cough and shortness of breath.   Cardiovascular: Negative for chest pain and palpitations.  Gastrointestinal: Negative for abdominal pain, blood in stool, constipation, diarrhea and nausea.  Endocrine: Negative for cold intolerance, heat intolerance, polydipsia, polyphagia and polyuria.  Genitourinary: Negative for dysuria and frequency.  Musculoskeletal: Positive for arthralgias (occasional) and gait problem. Negative for back pain.  Skin: Negative for rash and wound.  Neurological: Negative for dizziness and headaches.  Hematological: Negative for adenopathy. Does not bruise/bleed easily.      Objective:    Physical Exam  Constitutional: He is oriented to person, place, and time. He appears well-developed and well-nourished.  WNWD obese male in NAD sitting on a chair in exam  room, wearing mask as per office COVID-19 precautions.  Wheeled walker near patient in room  Neck: Normal range of motion. Neck supple. No JVD present.  Cardiovascular: Normal rate and regular rhythm.  Pulmonary/Chest: Effort normal and breath sounds normal.  Abdominal: Soft. There is no abdominal tenderness. There is no rebound and no guarding.  Truncal obesity  Musculoskeletal:        General: No tenderness or edema.     Comments: 5/5 strength in upper and lower extremities with exception of mild right hand decreased grip strength, mild weakness of right thigh flexion and patient with weakness  with dorsiflexion of right foot   Lymphadenopathy:    He has no cervical adenopathy.  Neurological: He is alert and oriented to person, place, and time.  Skin: Skin is warm and dry.  Psychiatric: He has a normal mood and affect. His behavior is normal.  Nursing note and vitals reviewed.    BP 137/85 (BP Location: Left Arm, Patient Position: Sitting, Cuff Size: Large)   Pulse 67   Temp 98.7 F (37.1 C) (Oral)   Ht 5\' 9"  (1.753 m)   Wt 265 lb 6.4 oz (120.4 kg)   SpO2 98%   BMI 39.19 kg/m  Wt Readings from Last 3 Encounters:  03/30/19 264 lb (119.7 kg)  03/17/19 271 lb 12.8 oz (123.3 kg)  02/11/18 292 lb 12.3 oz (132.8 kg)     Health Maintenance Due  Topic Date Due  . TETANUS/TDAP  12/08/1976  . INFLUENZA VACCINE  01/01/2019    Lab Results  Component Value Date   TSH 1.620 02/08/2018   Lab Results  Component Value Date   WBC 9.6 03/17/2019   HGB 14.0 03/17/2019   HCT 43.5 03/17/2019   MCV 87 03/17/2019   PLT 225 03/17/2019   Lab Results  Component Value Date   NA 142 03/17/2019   K 4.5 03/17/2019   CO2 22 03/17/2019   GLUCOSE 91 03/17/2019   BUN 16 03/17/2019   CREATININE 1.02 03/17/2019   BILITOT 1.0 03/17/2019   ALKPHOS 89 03/17/2019   AST 12 03/17/2019   ALT 7 03/17/2019   PROT 6.9 03/17/2019   ALBUMIN 4.2 03/17/2019   CALCIUM 9.6 03/17/2019   ANIONGAP 9  02/15/2018   Lab Results  Component Value Date   CHOL 143 03/17/2019   Lab Results  Component Value Date   HDL 48 03/17/2019   Lab Results  Component Value Date   LDLCALC 82 03/17/2019   Lab Results  Component Value Date   TRIG 66 03/17/2019   Lab Results  Component Value Date   CHOLHDL 3.0 03/17/2019   Lab Results  Component Value Date   HGBA1C 4.9 03/17/2019      Assessment & Plan:  1. 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   2. Essential hypertension Blood pressure is improved and near goal of 130/80. Continue use of amlodipine and lisinopril-hctz. Continue a healthy diet and continue to remain active.   3. Carotid atherosclerosis, bilateral Continue use of atorvastatin and a low fat diet. I will also place referral to vascular surgery for continued follow-up of his carotid atherosclerosis as well as moderate distal basilar and left P1 segment stenosis.   4. History of CVA (cerebrovascular accident) Patient's recent Neurology note reviewed. Neurologist did not find that patient had any issues with mood or memory which was his mother's concern. Patient expressed interest in completing Living Will documents and patient was able to meet with the social worker at today's visit to get information on Advanced Directives/Living Will. Continue secondary prevention efforts including continuing to remain adherent with medications for control of hypertension, lipids and continue use of aspirin. Keep cardiology follow-up (referral placed by neurologist) for cardiac monitoring to see if atrial fibrillation may have been a factor in his CVA.  - Ambulatory referral to Social Work   An After Visit Summary was printed and given to the patient.  Follow-up: Return in about 4 months (around 08/06/2019) for  HTN/lipids and history of CVA.    Cain Saupe, MD

## 2019-04-08 NOTE — Patient Instructions (Signed)
Advance Directive  Advance directives are legal documents that let you make choices ahead of time about your health care and medical treatment in case you become unable to communicate for yourself. Advance directives are a way for you to communicate your wishes to family, friends, and health care providers. This can help convey your decisions about end-of-life care if you become unable to communicate. Discussing and writing advance directives should happen over time rather than all at once. Advance directives can be changed depending on your situation and what you want, even after you have signed the advance directives. If you do not have an advance directive, some states assign family decision makers to act on your behalf based on how closely you are related to them. Each state has its own laws regarding advance directives. You may want to check with your health care provider, attorney, or state representative about the laws in your state. There are different types of advance directives, such as:  Medical power of attorney.  Living will.  Do not resuscitate (DNR) or do not attempt resuscitation (DNAR) order. Health care proxy and medical power of attorney A health care proxy, also called a health care agent, is a person who is appointed to make medical decisions for you in cases in which you are unable to make the decisions yourself. Generally, people choose someone they know well and trust to represent their preferences. Make sure to ask this person for an agreement to act as your proxy. A proxy may have to exercise judgment in the event of a medical decision for which your wishes are not known. A medical power of attorney is a legal document that names your health care proxy. Depending on the laws in your state, after the document is written, it may also need to be:  Signed.  Notarized.  Dated.  Copied.  Witnessed.  Incorporated into your medical record. You may also want to appoint  someone to manage your financial affairs in a situation in which you are unable to do so. This is called a durable power of attorney for finances. It is a separate legal document from the durable power of attorney for health care. You may choose the same person or someone different from your health care proxy to act as your agent in financial matters. If you do not appoint a proxy, or if there is a concern that the proxy is not acting in your best interests, a court-appointed guardian may be designated to act on your behalf. Living will A living will is a set of instructions documenting your wishes about medical care when you cannot express them yourself. Health care providers should keep a copy of your living will in your medical record. You may want to give a copy to family members or friends. To alert caregivers in case of an emergency, you can place a card in your wallet to let them know that you have a living will and where they can find it. A living will is used if you become:  Terminally ill.  Incapacitated.  Unable to communicate or make decisions. Items to consider in your living will include:  The use or non-use of life-sustaining equipment, such as dialysis machines and breathing machines (ventilators).  A DNR or DNAR order, which is the instruction not to use cardiopulmonary resuscitation (CPR) if breathing or heartbeat stops.  The use or non-use of tube feeding.  Withholding of food and fluids.  Comfort (palliative) care when the goal becomes comfort rather   than a cure.  Organ and tissue donation. A living will does not give instructions for distributing your money and property if you should pass away. It is recommended that you seek the advice of a lawyer when writing a will. Decisions about taxes, beneficiaries, and asset distribution will be legally binding. This process can relieve your family and friends of any concerns surrounding disputes or questions that may come up about  the distribution of your assets. DNR or DNAR A DNR or DNAR order is a request not to have CPR in the event that your heart stops beating or you stop breathing. If a DNR or DNAR order has not been made and shared, a health care provider will try to help any patient whose heart has stopped or who has stopped breathing. If you plan to have surgery, talk with your health care provider about how your DNR or DNAR order will be followed if problems occur. Summary  Advance directives are the legal documents that allow you to make choices ahead of time about your health care and medical treatment in case you become unable to communicate for yourself.  The process of discussing and writing advance directives should happen over time. You can change the advance directives, even after you have signed them.  Advance directives include DNR or DNAR orders, living wills, and designating an agent as your medical power of attorney. This information is not intended to replace advice given to you by your health care provider. Make sure you discuss any questions you have with your health care provider. Document Released: 08/26/2007 Document Revised: 06/23/2018 Document Reviewed: 04/07/2016 Elsevier Patient Education  2020 Elsevier Inc.  

## 2019-04-09 ENCOUNTER — Encounter: Payer: Self-pay | Admitting: Family Medicine

## 2019-04-15 NOTE — BH Specialist Note (Signed)
Integrated Behavioral Health Follow Up Visit  MRN: [id number] Name: Robert Donovan  Number of [place] Clinician visits: 2/6 Session Start time: 4:00 PM  Session End time: 4:15 PM Total time: 15  Type of Service: [place] Interpretor:No. Interpretor Name and Language: NA  SUBJECTIVE: Robert Donovan is a 63 y.o. male accompanied by self Patient was referred by Dr. Chapman Fitch for information on Advanced Directives. Patient reports the following symptoms/concerns: Pt is interested in completing Advanced Directives.  Duration of problem: na; Severity of problem: na  OBJECTIVE: Mood: Pleasant and Affect: Appropriate Risk of harm to self or others: No plan to harm self or others  LIFE CONTEXT: Family and Social: Pt receives strong support from family and friends School/Work: No reported concerns Self-Care: Pt is expecting paperwork from [employer] for an aid. He has also completed [employer] application to assist with transportation Life Changes: Pt is interested in completing Advanced Directives  GOALS ADDRESSED: Patient will: 1.  Reduce symptoms of: stress  2.  Increase knowledge and/or ability of: self-management skills  3.  Demonstrate ability to: Increase adequate support systems for patient/family  INTERVENTIONS: Interventions utilized:  Solution-Focused Strategies Standardized Assessments completed: GAD-7 and PHQ 2&9  ASSESSMENT: Patient was recently discharged from [facility] and has obtained independent housing. He is very interested in maintaining independence. Pt requested information on Advanced Directives. LCSW discussed difference between Power of Attorney and Living Will, in addition, to informing him that [facility] has a notary that provides services for free.    Patient has followed up with [facility] referral and [employer] application. LCSW will make additional attempts to contact Darrell Jewel with [employer] to follow up on  application.   PLAN: 1. Follow up with behavioral health clinician on : LCSW will follow up with pt regarding SCAT application status 2. Behavioral recommendations: Contact LCSW with any additional questions or resource needs 3. Referral(s): [place] (In Clinic) 4. "From scale of 1-10, how likely are you to follow plan?":   Rebekah Chesterfield, LCSW 04/15/2019 11:30 AM

## 2019-04-26 ENCOUNTER — Other Ambulatory Visit: Payer: Self-pay

## 2019-04-26 ENCOUNTER — Ambulatory Visit (INDEPENDENT_AMBULATORY_CARE_PROVIDER_SITE_OTHER): Payer: Medicaid Other | Admitting: Cardiovascular Disease

## 2019-04-26 ENCOUNTER — Encounter: Payer: Self-pay | Admitting: Cardiovascular Disease

## 2019-04-26 VITALS — BP 137/87 | HR 71 | Temp 96.9°F | Ht 69.0 in | Wt 269.8 lb

## 2019-04-26 DIAGNOSIS — I10 Essential (primary) hypertension: Secondary | ICD-10-CM | POA: Diagnosis not present

## 2019-04-26 DIAGNOSIS — I6340 Cerebral infarction due to embolism of unspecified cerebral artery: Secondary | ICD-10-CM

## 2019-04-26 DIAGNOSIS — E782 Mixed hyperlipidemia: Secondary | ICD-10-CM

## 2019-04-26 DIAGNOSIS — E785 Hyperlipidemia, unspecified: Secondary | ICD-10-CM | POA: Insufficient documentation

## 2019-04-26 NOTE — Patient Instructions (Addendum)
Medication Instructions:  Your physician recommends that you continue on your current medications as directed. Please refer to the Current Medication list given to you today.  If you need a refill on your cardiac medications before your next appointment, please call your pharmacy.   Lab work: NONE  Testing/Procedures: NONE  Follow-Up: At [facility], you and your health needs are our priority.  As part of our continuing mission to provide you with exceptional heart care, we have created designated Provider Care Teams.  These Care Teams include your primary Cardiologist (physician) and Advanced Practice Providers (APPs -  Physician Assistants and Nurse Practitioners) who all work together to provide you with the care you need, when you need it. You may see Dr Gwenlyn Found or one of the following Advanced Practice Providers on your designated Care Team:    Kerin Ransom, PA-C  [place], [place]  Coletta Memos, [place]  Your physician wants you to follow-up in: 1 year. You will receive a reminder letter in the mail two months in advance. If you don't receive a letter, please call our office to schedule the follow-up appointment.  Any Other Special Instructions Will Be Listed Below (If Applicable). Please make an appointment with Dr Sallyanne Kuster for Loop recorder implantation.

## 2019-04-26 NOTE — Progress Notes (Signed)
04/26/2019 Robert Donovan   1955-12-20  [id number]  Primary Physician Robert Blackbird, MD Primary Cardiologist: Robert Harp MD Robert Donovan, [place]  HPI:  Robert Donovan is a 63 y.o. moderately overweight single African-American male father of 1 daughter referred by Dr. Chapman Donovan, his PCP, for cardiovascular valuation because of prior strokes.  He has a history of treated hypertension hyperlipidemia.  He is never had a heart attack.  There is no family history of heart disease.  He denies chest pain or shortness of breath.  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  He was hospitalized 02/10/2018 for a week and placed stent the following year in a nursing home rehabilitating.  He was hospitalized for stroke.  He had extensive work-up during that hospitalization including multiple imaging modalities of his head and neck.  A transesophageal echo was performed by Dr. Harrell Donovan with a bubble study ruling out intracardiac source of thrombus or shunting.  At a thrombophilia work-up was negative.  There is no mention of atrial fibrillation.   Current Meds  Medication Sig  . amLODipine (NORVASC) 5 MG tablet Take 1 tablet (5 mg total) by mouth daily. To lower blood pressure  . aspirin EC 81 MG tablet Take 1 tablet (81 mg total) by mouth daily.  Marland Kitchen atorvastatin (LIPITOR) 40 MG tablet Take 1 tablet (40 mg total) by mouth daily. To lower cholesterol  . clopidogrel (PLAVIX) 75 MG tablet Take 1 tablet (75 mg total) by mouth daily.  Marland Kitchen lisinopril-hydrochlorothiazide (ZESTORETIC) 20-25 MG tablet Take 2 tablets by mouth daily. To lower blood pressure     Allergies  Allergen Reactions  . Hydrocodone     Pt stated, "I am not allergic, I do not want to take this medicine ever" Codeine - "I do not want to ever take this medicine - not allergic"  . Oxycodone     Pt stated, "I am not allergic, I do not want to take this medicine ever"    Social History   Socioeconomic History  . Marital status:  Single    Spouse name: Not on file  . Number of children: Not on file  . Years of education: Not on file  . Highest education level: Not on file  Occupational History  . Not on file  Social Needs  . Financial resource strain: Not on file  . Food insecurity    Worry: Not on file    Inability: Not on file  . Transportation needs    Medical: Not on file    Non-medical: Not on file  Tobacco Use  . Smoking status: Former Research scientist (life sciences)  . Smokeless tobacco: Never Used  Substance and Sexual Activity  . Alcohol use: Not Currently  . Drug use: Not Currently    Comment: hx marijuana use  . Sexual activity: Not Currently  Lifestyle  . Physical activity    Days per week: Not on file    Minutes per session: Not on file  . Stress: Not on file  Relationships  . Social Herbalist on phone: Not on file    Gets together: Not on file    Attends religious service: Not on file    Active member of club or organization: Not on file    Attends meetings of clubs or organizations: Not on file    Relationship status: Not on file  . Intimate partner violence    Fear of current or ex partner: Not on file  Emotionally abused: Not on file    Physically abused: Not on file    Forced sexual activity: Not on file  Other Topics Concern  . Not on file  Social History Narrative  . Not on file     Review of Systems: General: negative for chills, fever, night sweats or weight changes.  Cardiovascular: negative for chest pain, dyspnea on exertion, edema, orthopnea, palpitations, paroxysmal nocturnal dyspnea or shortness of breath Dermatological: negative for rash Respiratory: negative for cough or wheezing Urologic: negative for hematuria Abdominal: negative for nausea, vomiting, diarrhea, bright red blood per rectum, melena, or hematemesis Neurologic: negative for visual changes, syncope, or dizziness All other systems reviewed and are otherwise negative except as noted above.    Blood  pressure 137/87, pulse 71, temperature (!) 96.9 F (36.1 C), height 5\' 9"  (1.753 m), weight 269 lb 12.8 oz (122.4 kg), SpO2 99 %.  General appearance: alert and no distress Neck: no adenopathy, no carotid bruit, no JVD, supple, symmetrical, trachea midline and thyroid not enlarged, symmetric, no tenderness/mass/nodules Lungs: clear to auscultation bilaterally Heart: regular rate and rhythm, S1, S2 normal, no murmur, click, rub or gallop Extremities: extremities normal, atraumatic, no cyanosis or edema Pulses: 2+ and symmetric Skin: Skin color, texture, turgor normal. No rashes or lesions Neurologic: Alert and oriented X 3, normal strength and tone. Normal symmetric reflexes. Normal coordination and gait  EKG sinus rhythm at 71 without ST or T wave changes.  I personally reviewed this EKG.  ASSESSMENT AND PLAN:   CVA (cerebral vascular accident) [facility]) [name] was hospitalized for a week back in September of last year for multifocal stroke.  He had extensive work-up during his hospitalization including CT MRI, hypercoagulable work-up and transesophageal echo with bubble study that did not show evidence of blood clot or shunting.  He spent a year in a nursing home after discharge recuperating from his stroke.  I am going to refer him to Dr. October for consideration of loop recorder implantation to rule out an arrhythmogenic cause such as atrial fibrillation.  He was discharged home on clopidogrel which he remains on  Benign essential HTN History of essential hypertension with blood pressure measured today 137/87.  He is on lisinopril and hydrochlorothiazide as well as amlodipine.  Hyperlipidemia History of hyperlipidemia on statin therapy with lipid profile performed 03/17/2019 revealing a total cholesterol 143, LDL 76 and HDL 40.      03/19/2019 MD FACP,FACC,FAHA, [facility] 04/26/2019 2:48 PM

## 2019-04-26 NOTE — Assessment & Plan Note (Signed)
History of hyperlipidemia on statin therapy with lipid profile performed 03/17/2019 revealing a total cholesterol 143, LDL 76 and HDL 40.

## 2019-04-26 NOTE — Assessment & Plan Note (Signed)
History of essential hypertension with blood pressure measured today 137/87.  He is on lisinopril and hydrochlorothiazide as well as amlodipine.

## 2019-04-26 NOTE — Assessment & Plan Note (Addendum)
Robert Donovan was hospitalized for a week back in September of last year for multifocal stroke.  He had extensive work-up during his hospitalization including CT MRI, hypercoagulable work-up and transesophageal echo with bubble study that did not show evidence of blood clot or shunting.  He spent a year in a nursing home after discharge recuperating from his stroke.  I am going to refer him to Dr. Sallyanne Kuster for consideration of loop recorder implantation to rule out an arrhythmogenic cause such as atrial fibrillation.  He was discharged home on clopidogrel which he remains on

## 2019-05-04 ENCOUNTER — Ambulatory Visit: Payer: Medicaid Other | Admitting: Cardiovascular Disease

## 2019-05-10 ENCOUNTER — Other Ambulatory Visit: Payer: Self-pay

## 2019-05-10 ENCOUNTER — Ambulatory Visit (INDEPENDENT_AMBULATORY_CARE_PROVIDER_SITE_OTHER): Payer: Medicaid Other | Admitting: Cardiovascular Disease

## 2019-05-10 VITALS — BP 140/82 | HR 84 | Ht 69.0 in | Wt 274.0 lb

## 2019-05-10 DIAGNOSIS — I639 Cerebral infarction, unspecified: Secondary | ICD-10-CM

## 2019-05-10 DIAGNOSIS — I10 Essential (primary) hypertension: Secondary | ICD-10-CM | POA: Diagnosis not present

## 2019-05-10 DIAGNOSIS — G8191 Hemiplegia, unspecified affecting right dominant side: Secondary | ICD-10-CM

## 2019-05-10 DIAGNOSIS — E7800 Pure hypercholesterolemia, unspecified: Secondary | ICD-10-CM | POA: Diagnosis not present

## 2019-05-10 DIAGNOSIS — I6523 Occlusion and stenosis of bilateral carotid arteries: Secondary | ICD-10-CM | POA: Diagnosis not present

## 2019-05-10 NOTE — Progress Notes (Signed)
Cardiology Office Note:    Date:  05/15/2019   ID:  Robert Donovan, DOB Feb 07, 1956, MRN [id number]  PCP:  Robert Saupe, MD  Cardiologist:  No primary care provider on file.  Electrophysiologist:  None   Referring MD: Robert Saupe, MD   Chief Complaint  Patient presents with  . Cerebrovascular Accident  Referred to discuss implantable loop recorder by Dr. Nanetta Batty, MD.  History of Present Illness:    Robert Donovan is a 63 y.o. male with a hx of cryptogenic stroke, referred by Dr. Gery Donovan to discuss atrial fibrillation evaluation with an implantable loop recorder.  Robert Donovan has hypertension and hyperlipidemia, but no known coronary or peripheral vascular problems.  He had a multifocal stroke (mostly left frontal convexity, also left parietal periventricular and left posterior temporal white matter) in September 2019 and intensive work-up including TEE and saline contrast study, hypercoagulable work-up and evaluation of extracranial and intracranial arterial supply did not show an etiology for stroke.  Does have some atherosclerotic plaque in his carotids, occluding up to 50% stenosis of the bilateral carotid siphon but no significant stenoses or ulcerated lesions.  It has taken a whole year of rehab for him to recuperate.  He still uses assistive devices for walking.  His blood pressure is well controlled and he is taking a statin with adequate reduction in LDL cholesterol.  He does not have diabetes mellitus and does not smoke.  He is on chronic clopidogrel and aspirin therapy.  The patient specifically denies any chest pain at rest exertion, dyspnea at rest or with exertion, orthopnea, paroxysmal nocturnal dyspnea, syncope, palpitations, focal neurological deficits, intermittent claudication, lower extremity edema, unexplained weight gain, cough, hemoptysis or wheezing.   Past Medical History:  Diagnosis Date  . Arthritis   . High cholesterol   . Hypertension     Past Surgical  History:  Procedure Laterality Date  . TEE WITHOUT CARDIOVERSION N/A 02/16/2018   Procedure: TRANSESOPHAGEAL ECHOCARDIOGRAM (TEE);  Surgeon: Robert Red, MD;  Location: [facility] ENDOSCOPY;  Service: Cardiovascular;  Laterality: N/A;    Current Medications: No outpatient medications have been marked as taking for the 05/10/19 encounter (Office Visit) with Thurmon Fair, MD.     Allergies:   Hydrocodone and Oxycodone   Social History   Socioeconomic History  . Marital status: Single    Spouse name: Not on file  . Number of children: Not on file  . Years of education: Not on file  . Highest education level: Not on file  Occupational History  . Not on file  Tobacco Use  . Smoking status: Former Games developer  . Smokeless tobacco: Never Used  Substance and Sexual Activity  . Alcohol use: Not Currently  . Drug use: Not Currently    Comment: hx marijuana use  . Sexual activity: Not Currently  Other Topics Concern  . Not on file  Social History Narrative  . Not on file   Social Determinants of Health   Financial Resource Strain:   . Difficulty of Paying Living Expenses: Not on file  Food Insecurity:   . Worried About Programme researcher, broadcasting/film/video in the Last Year: Not on file  . Ran Out of Food in the Last Year: Not on file  Transportation Needs:   . Lack of Transportation (Medical): Not on file  . Lack of Transportation (Non-Medical): Not on file  Physical Activity:   . Days of Exercise per Week: Not on file  . Minutes of Exercise per Session: Not on  file  Stress:   . Feeling of Stress : Not on file  Social Connections:   . Frequency of Communication with Friends and Family: Not on file  . Frequency of Social Gatherings with Friends and Family: Not on file  . Attends Religious Services: Not on file  . Active Member of Clubs or Organizations: Not on file  . Attends Banker Meetings: Not on file  . Marital Status: Not on file     Family History: The patient's  family history includes Diabetes in his brother; Healthy in his mother; Heart attack in his father.  ROS:   Please see the history of present illness.     All other systems reviewed and are negative.  EKGs/Labs/Other Studies Reviewed:    The following studies were reviewed today: Transesophageal and transthoracic echo September 2019 TEE: - Left ventricle: Systolic function was normal. The estimated ejection fraction was in the range of 60% to 65%. Wall motion was normal; there were no regional wall motion abnormalities. - Left atrium: No evidence of thrombus in the atrial cavity or appendage. - Right atrium: No evidence of thrombus in the atrial cavity or appendage. - Atrial septum: No defect or patent foramen ovale was identified.   Echo contrast study showed no right-to-left atrial level shunt.  Impressions:  - Normal study. No cardiac source of emboli was indentified.  TTE:- Left ventricle: The cavity size was normal. Wall thickness was increased in a pattern of moderate LVH. Systolic function was normal. The estimated ejection fraction was in the range of 60% to 65%. Wall motion was normal; there were no regional wall motion abnormalities. Doppler parameters are consistent with abnormal left ventricular relaxation (grade 1 diastolic dysfunction).  Impressions:  - Normal LV systolic function   Grade 1 diastolic dysfunction.   Technically difficult images . MRI brain, CT angio head and neck from September 2019  EKG:  EKG is not ordered today.  The ekg ordered 04/27/2019 today demonstrates NSR, normal ecg  Recent Labs: 03/17/2019: ALT 7; BUN 16; Creatinine, Ser 1.02; Hemoglobin 14.0; Platelets 225; Potassium 4.5; Sodium 142  Recent Lipid Panel    Component Value Date/Time   CHOL 143 03/17/2019 1215   TRIG 66 03/17/2019 1215   HDL 48 03/17/2019 1215   CHOLHDL 3.0 03/17/2019 1215   CHOLHDL 4.3 02/11/2018 0345   VLDL 16 02/11/2018 0345   LDLCALC 82 03/17/2019 1215     Physical Exam:    VS:  BP 140/82   Pulse 84   Ht  (1.753 m)   Wt 274 lb (124.3 kg)   SpO2 97%   BMI 40.46 kg/m     Wt Readings from Last 3 Encounters:  05/10/19 274 lb (124.3 kg)  04/26/19 269 lb 12.8 oz (122.4 kg)  04/08/19 265 lb 6.4 oz (120.4 kg)     GEN:  Well nourished, well developed in no acute distress HEENT: Normal NECK: No JVD; No carotid bruits LYMPHATICS: No lymphadenopathy CARDIAC: RRR, no murmurs, rubs, gallops RESPIRATORY:  Clear to auscultation without rales, wheezing or rhonchi  ABDOMEN: Soft, non-tender, non-distended MUSCULOSKELETAL:  No edema; No deformity  SKIN: Warm and dry NEUROLOGIC:  Alert and oriented x 3. Mild R hemiparesis, especially lower leg PSYCHIATRIC:  Normal affect   ASSESSMENT:    1. Cryptogenic stroke (HCC)   2. Intracranial carotid stenosis, bilateral   3. Right hemiparesis (HCC)   4. Essential hypertension   5. Hypercholesterolemia    PLAN:    In  order of problems listed above:  1. Hx CVA: Appropriate to proceed with implantable loop recorder.  Discussed the technology and implantation with the patient in detail. This procedure has been fully reviewed with the patient and written informed consent has been obtained. 2. ASCVD: Although he does have bilateral carotid siphon atherosclerotic calcification, this was not felt to be sufficient to explain his strokes. 3. Hemiparesis R: Uses assistive devices to walk. 4. HTN: Ideal BP 130/80 or less but avoid hypotension due to intracranial carotid atherosclerosis 5. HLP: Target LDL less than 70.   Medication Adjustments/Labs and Tests Ordered: Current medicines are reviewed at length with the patient today.  Concerns regarding medicines are outlined above.  No orders of the defined types were placed in this encounter.  No orders of the defined types were placed in this encounter.   Patient Instructions  Medication Instructions: Your physician recommends that you continue on  your current medications as directed. Please refer to the Current Medication list given to you today.  * If you need a refill on your cardiac medications before your next appointment, please call your pharmacy.   * We will notify you of abnormal results, otherwise continue current treatment plan.*  Testing/Procedures: Your physician has recommended that you have a loop recorder implant. Please follow the instructions below, located under the special instructions section.  Follow-Up: Your physician recommends that you schedule a wound check appointment 10-14 days, after your procedure on 12/14, with the device clinic.  Your physician recommends that you schedule a follow up appointment in 91 days, after your procedure on 12/14 with Dr. Allyson SabalBerry.  Thank you for choosing [facility]!!          [facility]  [address], [address]  [place], [place] [address]  Phone: [phone] Fax: [phone]   You are scheduled for a Loop Recorder Implant on 05/16/2019 at 2 pm with Dr. Royann Shiversroitoru.    You do not need to be fasting.    Please bring your insurance cards and a list of your medications with you.    Wash your chest and neck with the surgical soap provided the evening before and the morning of your procedure. Please following the washing instructions provided. You may pick the scrub up at the office or any drug store.   Preparing for Surgery  Before surgery, you can play an important role. Because skin is not sterile, your skin needs to be as free of germs as possible. You can reduce the number of germs on your skin by washing with CHG (chlorhexidine gluconate) Soap before surgery. CHG is an antiseptic cleaner which kills germs and bonds with the skin to continue killing germs even after washing.  Please do not use if you have an allergy to CHG or antibacterial soaps. If your skin becomes reddened/irritated, STOP using the CHG.  DO NOT SHAVE  (including legs and underarms) for at least 48 hours prior to first CHG shower. It is OK to shave your face.  Please follow these instructions carefully: 1. Shower the night before surgery and the morning of surgery with CHG Soap. 2. If you chose to wash your hair, wash your hair first as usual with your normal shampoo/conditioner. 3. After you shampoo/condition, rinse you hair and body thoroughly to remove shampoo/conditioner. 4. Use CHG as you would any other liquid soap. You can apply CHG directly to the skin and wash gently with a loofah or a clean washcloth. 5. Apply  the CHG Soap to your body ONLY FROM THE NECK DOWN. Do not use on open wounds or open sores. Avoid contact with your eyes, ears, mouth, and genitals (private parts). Wash genitals (private part) with your normal soap. 6. Wash thoroughly, paying special attention to the area where your surgery will be performed. 7. Thoroughly rinse your body with warm water from the neck down. 8. DO NOT shower/wash with your normal soap after using and rinsing off the CHG Soap. 9. Pat yourself dry with a clean towel. 10. Wear clean pajamas to bed. 11. Place clean sheets on your bed the night of your first shower and do not sleep with pets..  Day of Surgery: Shower with the CHG Soap following the instructions listed above. DO NOT apply deodorants or lotions. Please wear clean clothes to the hospital/surgery center.      Signed, Sanda Klein, MD  05/15/2019 11:38 AM    [facility]

## 2019-05-10 NOTE — Patient Instructions (Signed)
Medication Instructions: Your physician recommends that you continue on your current medications as directed. Please refer to the Current Medication list given to you today.  * If you need a refill on your cardiac medications before your next appointment, please call your pharmacy.   * We will notify you of abnormal results, otherwise continue current treatment plan.*  Testing/Procedures: Your physician has recommended that you have a loop recorder implant. Please follow the instructions below, located under the special instructions section.  Follow-Up: Your physician recommends that you schedule a wound check appointment 10-14 days, after your procedure on 12/14, with the device clinic.  Your physician recommends that you schedule a follow up appointment in 91 days, after your procedure on 12/14 with Dr. Gwenlyn Found.  Thank you for choosing [facility]!!          [facility] [place], [place]  [place], [place] [address]  Phone: [phone] Fax: [phone]   You are scheduled for a Loop Recorder Implant on 05/16/2019 at 2 pm with Dr. Sallyanne Kuster.    You do not need to be fasting.    Please bring your insurance cards and a list of your medications with you.    Wash your chest and neck with the surgical soap provided the evening before and the morning of your procedure. Please following the washing instructions provided. You may pick the scrub up at the office or any drug store.   Preparing for Surgery  Before surgery, you can play an important role. Because skin is not sterile, your skin needs to be as free of germs as possible. You can reduce the number of germs on your skin by washing with CHG (chlorhexidine gluconate) Soap before surgery. CHG is an antiseptic cleaner which kills germs and bonds with the skin to continue killing germs even after washing.  Please do not use if you have an allergy to CHG or antibacterial soaps. If  your skin becomes reddened/irritated, STOP using the CHG.  DO NOT SHAVE (including legs and underarms) for at least 48 hours prior to first CHG shower. It is OK to shave your face.  Please follow these instructions carefully: 1. Shower the night before surgery and the morning of surgery with CHG Soap. 2. If you chose to wash your hair, wash your hair first as usual with your normal shampoo/conditioner. 3. After you shampoo/condition, rinse you hair and body thoroughly to remove shampoo/conditioner. 4. Use CHG as you would any other liquid soap. You can apply CHG directly to the skin and wash gently with a loofah or a clean washcloth. 5. Apply the CHG Soap to your body ONLY FROM THE NECK DOWN. Do not use on open wounds or open sores. Avoid contact with your eyes, ears, mouth, and genitals (private parts). Wash genitals (private part) with your normal soap. 6. Wash thoroughly, paying special attention to the area where your surgery will be performed. 7. Thoroughly rinse your body with warm water from the neck down. 8. DO NOT shower/wash with your normal soap after using and rinsing off the CHG Soap. 9. Pat yourself dry with a clean towel. 10. Wear clean pajamas to bed. 11. Place clean sheets on your bed the night of your first shower and do not sleep with pets..  Day of Surgery: Shower with the CHG Soap following the instructions listed above. DO NOT apply deodorants or lotions. Please wear clean clothes to the hospital/surgery center.

## 2019-05-13 ENCOUNTER — Telehealth: Payer: Self-pay | Admitting: *Deleted

## 2019-05-13 NOTE — Telephone Encounter (Signed)
The patient has been called and notified that his loop recorder procedure will be at the [facility] st office instead of at [facility] The procedure will be done at 2 pm with Dr. Sallyanne Kuster. The address and phone number have been given. He has verbalized his understanding.

## 2019-05-15 ENCOUNTER — Encounter: Payer: Self-pay | Admitting: Cardiovascular Disease

## 2019-05-16 ENCOUNTER — Other Ambulatory Visit: Payer: Self-pay

## 2019-05-16 ENCOUNTER — Ambulatory Visit: Payer: Medicaid Other | Admitting: Cardiovascular Disease

## 2019-05-16 DIAGNOSIS — Z9889 Other specified postprocedural states: Secondary | ICD-10-CM

## 2019-05-16 DIAGNOSIS — I639 Cerebral infarction, unspecified: Secondary | ICD-10-CM | POA: Diagnosis not present

## 2019-05-16 HISTORY — DX: Other specified postprocedural states: Z98.890

## 2019-05-16 HISTORY — PX: LOOP RECORDER IMPLANT: SHX5954

## 2019-05-16 NOTE — Progress Notes (Signed)
Cardiology Office Note:    Date:  05/16/2019   ID:  Robert Donovan, DOB 1956-02-22, MRN [id number]  PCP:  Robert Saupe, MD  Cardiologist:  No primary care provider on file. Robert Quan, MD Electrophysiologist:  None   Referring MD: Robert Saupe, MD   Chief complaint: Loop recorder implantation  History of Present Illness:    Robert Donovan is a 63 y.o. male with a hx of cryptogenic stroke, referred by Dr. Allyson Donovan, here for implantation of a loop recorder.  This procedure has been fully reviewed with the patient and written informed consent has been obtained.  Robert Donovan has hypertension and hyperlipidemia, but no known coronary or peripheral vascular problems.  He had a multifocal stroke (mostly left frontal convexity, also left parietal periventricular and left posterior temporal white matter) in September 2019 and intensive work-up including TEE and saline contrast study, hypercoagulable work-up and evaluation of extracranial and intracranial arterial supply did not show an etiology for stroke.  Does have some atherosclerotic plaque in his carotids, occluding up to 50% stenosis of the bilateral carotid siphon but no significant stenoses or ulcerated lesions.  It has taken a whole year of rehab for him to recuperate.  He still uses assistive devices for walking.  His blood pressure is well controlled and he is taking a statin with adequate reduction in LDL cholesterol.  He does not have diabetes mellitus and does not smoke.  He is on chronic clopidogrel and aspirin therapy.  The patient specifically denies any chest pain at rest exertion, dyspnea at rest or with exertion, orthopnea, paroxysmal nocturnal dyspnea, syncope, palpitations, focal neurological deficits, intermittent claudication, lower extremity edema, unexplained weight gain, cough, hemoptysis or wheezing.   Past Medical History:  Diagnosis Date  . Arthritis   . High cholesterol   . Hypertension     Past Surgical History:   Procedure Laterality Date  . TEE WITHOUT CARDIOVERSION N/A 02/16/2018   Procedure: TRANSESOPHAGEAL ECHOCARDIOGRAM (TEE);  Surgeon: Jodelle Red, MD;  Location: [facility] ENDOSCOPY;  Service: Cardiovascular;  Laterality: N/A;    Current Medications: No outpatient medications have been marked as taking for the 05/16/19 encounter (Appointment) with Robert Fair, MD.     Allergies:   Hydrocodone and Oxycodone   Social History   Socioeconomic History  . Marital status: Single    Spouse name: Not on file  . Number of children: Not on file  . Years of education: Not on file  . Highest education level: Not on file  Occupational History  . Not on file  Tobacco Use  . Smoking status: Former Games developer  . Smokeless tobacco: Never Used  Substance and Sexual Activity  . Alcohol use: Not Currently  . Drug use: Not Currently    Comment: hx marijuana use  . Sexual activity: Not Currently  Other Topics Concern  . Not on file  Social History Narrative  . Not on file   Social Determinants of Health   Financial Resource Strain:   . Difficulty of Paying Living Expenses: Not on file  Food Insecurity:   . Worried About Programme researcher, broadcasting/film/video in the Last Year: Not on file  . Ran Out of Food in the Last Year: Not on file  Transportation Needs:   . Lack of Transportation (Medical): Not on file  . Lack of Transportation (Non-Medical): Not on file  Physical Activity:   . Days of Exercise per Week: Not on file  . Minutes of Exercise per Session: Not on file  Stress:   . Feeling of Stress : Not on file  Social Connections:   . Frequency of Communication with Friends and Family: Not on file  . Frequency of Social Gatherings with Friends and Family: Not on file  . Attends Religious Services: Not on file  . Active Member of Clubs or Organizations: Not on file  . Attends Banker Meetings: Not on file  . Marital Status: Not on file     Family History: The patient's family history  includes Diabetes in his brother; Healthy in his mother; Heart attack in his father.  ROS:   Please see the history of present illness.     All other systems reviewed and are negative.  EKGs/Labs/Other Studies Reviewed:    The following studies were reviewed today: Transesophageal and transthoracic echo September 2019 TEE: - Left ventricle: Systolic function was normal. The estimated ejection fraction was in the range of 60% to 65%. Wall motion was normal; there were no regional wall motion abnormalities. - Left atrium: No evidence of thrombus in the atrial cavity or appendage. - Right atrium: No evidence of thrombus in the atrial cavity or appendage. - Atrial septum: No defect or patent foramen ovale was identified.   Echo contrast study showed no right-to-left atrial level shunt.  Impressions:  - Normal study. No cardiac source of emboli was indentified.  TTE:- Left ventricle: The cavity size was normal. Wall thickness was increased in a pattern of moderate LVH. Systolic function was normal. The estimated ejection fraction was in the range of 60% to 65%. Wall motion was normal; there were no regional wall motion abnormalities. Doppler parameters are consistent with abnormal left ventricular relaxation (grade 1 diastolic dysfunction).  Impressions:  - Normal LV systolic function   Grade 1 diastolic dysfunction.   Technically difficult images . MRI brain, CT angio head and neck from September 2019  EKG:  EKG is not ordered today.  The ekg ordered 04/27/2019 today demonstrates NSR, normal ecg  Recent Labs: 03/17/2019: ALT 7; BUN 16; Creatinine, Ser 1.02; Hemoglobin 14.0; Platelets 225; Potassium 4.5; Sodium 142  Recent Lipid Panel    Component Value Date/Time   CHOL 143 03/17/2019 1215   TRIG 66 03/17/2019 1215   HDL 48 03/17/2019 1215   CHOLHDL 3.0 03/17/2019 1215   CHOLHDL 4.3 02/11/2018 0345   VLDL 16 02/11/2018 0345   LDLCALC 82 03/17/2019 1215    Physical Exam:     VS:  There were no vitals taken for this visit.    Wt Readings from Last 3 Encounters:  05/10/19 274 lb (124.3 kg)  04/26/19 269 lb 12.8 oz (122.4 kg)  04/08/19 265 lb 6.4 oz (120.4 kg)     GEN:  Well nourished, well developed in no acute distress HEENT: Normal NECK: No JVD; No carotid bruits LYMPHATICS: No lymphadenopathy CARDIAC: RRR, no murmurs, rubs, gallops RESPIRATORY:  Clear to auscultation without rales, wheezing or rhonchi  ABDOMEN: Soft, non-tender, non-distended MUSCULOSKELETAL:  No edema; No deformity  SKIN: Warm and dry NEUROLOGIC:  Alert and oriented x 3. Mild R hemiparesis, especially lower leg PSYCHIATRIC:  Normal affect   ASSESSMENT:    No diagnosis found. PLAN:    In order of problems listed above:  1. Hx CVA: Appropriate to proceed with implantable loop recorder.  Discussed the technology and implantation with the patient in detail. This procedure has been fully reviewed with the patient and written informed consent has been obtained. 2. ASCVD: Although he does have bilateral  carotid siphon atherosclerotic calcification, this was not felt to be sufficient to explain his strokes. 3. Hemiparesis R: Uses assistive devices to walk. 4. HTN: Ideal BP 130/80 or less but avoid hypotension due to intracranial carotid atherosclerosis 5. HLP: Target LDL less than 70.   Medication Adjustments/Labs and Tests Ordered: Current medicines are reviewed at length with the patient today.  Concerns regarding medicines are outlined above.  No orders of the defined types were placed in this encounter.  No orders of the defined types were placed in this encounter.   There are no Patient Instructions on file for this visit.   Signed, Robert Fair, MD  05/16/2019 1:51 PM    [facility]

## 2019-05-16 NOTE — Progress Notes (Addendum)
LOOP RECORDER IMPLANT   Procedure report  Procedure performed:  Loop recorder implantation   Reason for procedure:  1. Cryptogenic stroke Procedure performed by:  Sanda Klein, MD  Complications:  None  Estimated blood loss:  <5 mL  Medications administered during procedure:  Lidocaine 1% with 1/10,000 epinephrine 10 mL locally Device details:  Medtronic Reveal Linq model number [id number], serial number [id number] Procedure details:  After the risks and benefits of the procedure were discussed the patient provided informed consent. The patient was prepped and draped in usual sterile fashion. Local anesthesia was administered to an area 2 cm to the left of the sternum in the 4th intercostal space. A cutaneous incision was made using the incision tool. The introducer was then used to create a subcutaneous tunnel and carefully deploy the device. Local pressure was held to ensure hemostasis.  The incision was closed with SteriStrips and a sterile dressing was applied.  R waves 0.37mV  Sanda Klein, MD, [facility] [phone] office [phone] pager 05/16/2019 2:34 PM

## 2019-05-16 NOTE — Patient Instructions (Addendum)
Supplemental Discharge Instructions for  Loop Recorder Implant Patients  ACTIVITY No restrictions. DO wear your seatbelt, even if it crosses over the pacemaker site.  WOUND CARE  Keep the wound area clean and dry.  Remove the dressing the day after you return home (usually 48 hours after the procedure).  DO NOT SUBMERGE UNDER WATER UNTIL FULLY HEALED (no tub baths, hot tubs, swimming pools, etc.).   You  may shower or take a sponge bath after the dressing is removed. DO NOT SOAK the area and do not allow the shower to directly spray on the site.  If you have tape/steri-strips on your wound, these will fall off; do not pull them off prematurely.    No bandage is needed on the site.  DO  NOT apply any creams, oils, or ointments to the wound area.  If you notice any drainage or discharge from the wound, any swelling, excessive redness or bruising at the site, or if you develop a fever > 101? F after you are discharged home, call the office at once.  SPECIAL INSTRUCTIONS  You are still able to use cellular telephones.  Avoid carrying your cellular phone near your device.  When traveling through airports, show security personnel your identification card to avoid being screened in the metal detectors.   Avoid arc welding equipment, MRI testing (magnetic resonance imaging), TENS units (transcutaneous nerve stimulators).  Call the office for questions about other devices.  Avoid electrical appliances that are in poor condition or are not properly grounded.  Microwave ovens are safe to be near or to operate.

## 2019-05-26 ENCOUNTER — Other Ambulatory Visit: Payer: Self-pay

## 2019-05-26 ENCOUNTER — Telehealth (INDEPENDENT_AMBULATORY_CARE_PROVIDER_SITE_OTHER): Payer: Medicaid Other | Admitting: Student

## 2019-05-26 DIAGNOSIS — I639 Cerebral infarction, unspecified: Secondary | ICD-10-CM

## 2019-05-26 DIAGNOSIS — Z4889 Encounter for other specified surgical aftercare: Secondary | ICD-10-CM

## 2019-05-26 LAB — CUP PACEART INCLINIC DEVICE CHECK
Date Time Interrogation Session: 20201224103718
Implantable Pulse Generator Implant Date: 20201214

## 2019-05-26 NOTE — Progress Notes (Signed)
ILR wound check virtually by patient request. Steri strips previously removed. Wound well healed. Home monitor transmitting nightly. No episodes. Questions answered.

## 2019-06-02 ENCOUNTER — Telehealth (HOSPITAL_COMMUNITY): Payer: Self-pay

## 2019-06-02 NOTE — Telephone Encounter (Signed)

## 2019-06-06 ENCOUNTER — Ambulatory Visit (HOSPITAL_COMMUNITY)
Admission: RE | Admit: 2019-06-06 | Discharge: 2019-06-06 | Disposition: A | Discharge status: Home / Self Care | Payer: Medicaid Other | Source: Ambulatory Visit | Attending: Surgery | Admitting: Surgery

## 2019-06-06 ENCOUNTER — Other Ambulatory Visit: Payer: Self-pay

## 2019-06-06 ENCOUNTER — Encounter: Payer: Self-pay | Admitting: Surgery

## 2019-06-06 ENCOUNTER — Ambulatory Visit (INDEPENDENT_AMBULATORY_CARE_PROVIDER_SITE_OTHER): Payer: Medicaid Other | Admitting: Surgery

## 2019-06-06 ENCOUNTER — Other Ambulatory Visit: Payer: Self-pay | Admitting: *Deleted

## 2019-06-06 VITALS — BP 124/76 | HR 66 | Temp 97.2°F | Resp 20 | Ht 69.0 in | Wt 268.3 lb

## 2019-06-06 DIAGNOSIS — I6529 Occlusion and stenosis of unspecified carotid artery: Secondary | ICD-10-CM | POA: Insufficient documentation

## 2019-06-06 DIAGNOSIS — I6523 Occlusion and stenosis of bilateral carotid arteries: Secondary | ICD-10-CM | POA: Diagnosis not present

## 2019-06-06 DIAGNOSIS — I639 Cerebral infarction, unspecified: Secondary | ICD-10-CM | POA: Diagnosis not present

## 2019-06-06 NOTE — Progress Notes (Signed)
Vascular and Vein Specialist of [facility]  Patient name: Robert Donovan MRN: [id number] DOB: 1956-04-30 Sex: male   REQUESTING PROVIDER:    Dr. Chapman Fitch   REASON FOR CONSULT:    Carotid disease  HISTORY OF PRESENT ILLNESS:   Robert Donovan is a 64 y.o. male, who is referred today for evaluation of carotid disease.  The patient has a history of multiple embolic strokes with unknown etiology.  He has undergone CT angiogram which did not show any extracranial carotid disease.  He did have carotid siphon stenosis.  He has also had a loop recorder implanted.  He has undergone a hypercoagulable work-up which was negative.  Echo was negative for PFO.  Neurosyphilis has been ruled out.  Patient has a history of syphilis and gonorrhea.  He is medically managed for hypertension which is remained stable.  He takes a statin for hypercholesterolemia.  He is a former smoker.  He suffers from morbid obesity.  PAST MEDICAL HISTORY    Past Medical History:  Diagnosis Date  . Arthritis   . High cholesterol   . Hypertension      FAMILY HISTORY   Family History  Problem Relation Age of Onset  . Healthy Mother   . Heart attack Father   . Diabetes Brother     SOCIAL HISTORY:   Social History   Socioeconomic History  . Marital status: Single    Spouse name: Not on file  . Number of children: Not on file  . Years of education: Not on file  . Highest education level: Not on file  Occupational History  . Not on file  Tobacco Use  . Smoking status: Former Research scientist (life sciences)  . Smokeless tobacco: Never Used  Substance and Sexual Activity  . Alcohol use: Not Currently  . Drug use: Not Currently    Comment: hx marijuana use  . Sexual activity: Not Currently  Other Topics Concern  . Not on file  Social History Narrative  . Not on file   Social Determinants of Health   Financial Resource Strain:   . Difficulty of Paying Living Expenses: Not on file  Food  Insecurity:   . Worried About Charity fundraiser in the Last Year: Not on file  . Ran Out of Food in the Last Year: Not on file  Transportation Needs:   . Lack of Transportation (Medical): Not on file  . Lack of Transportation (Non-Medical): Not on file  Physical Activity:   . Days of Exercise per Week: Not on file  . Minutes of Exercise per Session: Not on file  Stress:   . Feeling of Stress : Not on file  Social Connections:   . Frequency of Communication with Friends and Family: Not on file  . Frequency of Social Gatherings with Friends and Family: Not on file  . Attends Religious Services: Not on file  . Active Member of Clubs or Organizations: Not on file  . Attends Archivist Meetings: Not on file  . Marital Status: Not on file  Intimate Partner Violence:   . Fear of Current or Ex-Partner: Not on file  . Emotionally Abused: Not on file  . Physically Abused: Not on file  . Sexually Abused: Not on file    ALLERGIES:    Allergies  Allergen Reactions  . Hydrocodone     Pt stated, "I am not allergic, I do not want to take this medicine ever" Codeine - "I do not want to ever take this  medicine - not allergic"  . Oxycodone     Pt stated, "I am not allergic, I do not want to take this medicine ever"    CURRENT MEDICATIONS:    Current Outpatient Medications  Medication Sig Dispense Refill  . amLODipine (NORVASC) 5 MG tablet Take 1 tablet (5 mg total) by mouth daily. To lower blood pressure 30 tablet 2  . aspirin EC 81 MG tablet Take 1 tablet (81 mg total) by mouth daily. 90 tablet 3  . atorvastatin (LIPITOR) 40 MG tablet Take 1 tablet (40 mg total) by mouth daily. To lower cholesterol 30 tablet 2  . clopidogrel (PLAVIX) 75 MG tablet Take 1 tablet (75 mg total) by mouth daily.    Marland Kitchen lisinopril-hydrochlorothiazide (ZESTORETIC) 20-25 MG tablet Take 2 tablets by mouth daily. To lower blood pressure 60 tablet 2   No current facility-administered medications for this  visit.    REVIEW OF SYSTEMS:   [X]  denotes positive finding, [ ]  denotes negative finding Cardiac  Comments:  Chest pain or chest pressure:    Shortness of breath upon exertion:    Short of breath when lying flat:    Irregular heart rhythm:        Vascular    Pain in calf, thigh, or hip brought on by ambulation:    Pain in feet at night that wakes you up from your sleep:     Blood clot in your veins:    Leg swelling:         Pulmonary    Oxygen at home:    Productive cough:     Wheezing:         Neurologic    Sudden weakness in arms or legs:     Sudden numbness in arms or legs:     Sudden onset of difficulty speaking or slurred speech:    Temporary loss of vision in one eye:     Problems with dizziness:         Gastrointestinal    Blood in stool:      Vomited blood:         Genitourinary    Burning when urinating:     Blood in urine:        Psychiatric    Major depression:         Hematologic    Bleeding problems:    Problems with blood clotting too easily:        Skin    Rashes or ulcers:        Constitutional    Fever or chills:     PHYSICAL EXAM:   Vitals:   06/06/19 1338 06/06/19 1341  BP: 130/73 124/76  Pulse: 66   Resp: 20   Temp: (!) 97.2 F (36.2 C)   SpO2: 100%   Weight: 268 lb 4.8 oz (121.7 kg)   Height: 5\' 9"  (1.753 m)     GENERAL: The patient is a well-nourished male, in no acute distress. The vital signs are documented above. CARDIAC: There is a regular rate and rhythm.  PULMONARY: Nonlabored respirations ABDOMEN: Soft and non-tender with normal pitched bowel sounds.  MUSCULOSKELETAL: There are no major deformities or cyanosis. NEUROLOGIC: No focal weakness or paresthesias are detected. SKIN: There are no ulcers or rashes noted. PSYCHIATRIC: The patient has a normal affect.  STUDIES:   I have reviewed the following u/s Right Carotid: Velocities in the right ICA are consistent with a 1-39% stenosis.  Left Carotid: Velocities  in  the left ICA are consistent with a 1-39% stenosis.               The ECA appears >50% stenosed.  Vertebrals:  Right vertebral artery demonstrates antegrade flow. Left vertebral              artery demonstrates no discernable flow. Subclavians: Normal flow hemodynamics were seen in bilateral subclavian              arteries.  ASSESSMENT and PLAN   History of cryptogenic stroke: The patient is followed by neurology and has undergone an exhaustive work-up which has been unremarkable.  He currently has an implantable loop recorder in place.  Doppler studies today of the carotid arteries did not show any significant stenosis.  No extracranial carotid revascularization is indicated at this time.  I have him scheduled for repeat Doppler studies in 1 year.   Charlena Cross, MD, FACS Vascular and Vein Specialists of [facility] [phone] Pager [phone]

## 2019-06-10 ENCOUNTER — Other Ambulatory Visit: Payer: Self-pay | Admitting: *Deleted

## 2019-06-10 DIAGNOSIS — I6529 Occlusion and stenosis of unspecified carotid artery: Secondary | ICD-10-CM

## 2019-06-14 ENCOUNTER — Other Ambulatory Visit: Payer: Self-pay | Admitting: Family Medicine

## 2019-06-14 DIAGNOSIS — I129 Hypertensive chronic kidney disease with stage 1 through stage 4 chronic kidney disease, or unspecified chronic kidney disease: Secondary | ICD-10-CM

## 2019-06-14 DIAGNOSIS — N182 Chronic kidney disease, stage 2 (mild): Secondary | ICD-10-CM

## 2019-06-14 DIAGNOSIS — I6523 Occlusion and stenosis of bilateral carotid arteries: Secondary | ICD-10-CM

## 2019-06-14 NOTE — Telephone Encounter (Signed)
Lisinopril-HCTZ was filled for patient today. Please advise if patient should be taking the amlodipine also.

## 2019-06-17 ENCOUNTER — Telehealth: Payer: Self-pay | Admitting: Cardiovascular Disease

## 2019-06-17 NOTE — Telephone Encounter (Signed)
We are recommending the COVID-19 vaccine to all of our patients. Cardiac medications (including blood thinners) should not deter anyone from being vaccinated and there is no need to hold any of those medications prior to vaccine administration.     Currently, there is a hotline to call (active 06/10/19) to schedule vaccination appointments as no walk-ins will be accepted.   Number: [phone]    If you have further questions or concerns about the vaccine process, please visit [email or web address] or contact your primary care physician.         

## 2019-06-20 ENCOUNTER — Ambulatory Visit (INDEPENDENT_AMBULATORY_CARE_PROVIDER_SITE_OTHER): Payer: Medicaid Other | Admitting: *Deleted

## 2019-06-20 DIAGNOSIS — I639 Cerebral infarction, unspecified: Secondary | ICD-10-CM

## 2019-06-21 LAB — CUP PACEART REMOTE DEVICE CHECK
Date Time Interrogation Session: 20210119085354
Implantable Pulse Generator Implant Date: 20201214

## 2019-06-30 ENCOUNTER — Ambulatory Visit: Payer: Medicaid Other | Admitting: Adult Health

## 2019-07-07 ENCOUNTER — Ambulatory Visit: Payer: Medicaid Other | Admitting: Adult Health

## 2019-07-11 ENCOUNTER — Other Ambulatory Visit: Payer: Self-pay

## 2019-07-11 ENCOUNTER — Encounter: Payer: Self-pay | Admitting: Adult Health

## 2019-07-11 ENCOUNTER — Ambulatory Visit: Payer: Medicaid Other | Admitting: Adult Health

## 2019-07-11 VITALS — BP 134/79 | HR 62 | Temp 97.5°F | Ht 69.0 in | Wt 273.0 lb

## 2019-07-11 DIAGNOSIS — I639 Cerebral infarction, unspecified: Secondary | ICD-10-CM | POA: Diagnosis not present

## 2019-07-11 DIAGNOSIS — I10 Essential (primary) hypertension: Secondary | ICD-10-CM | POA: Diagnosis not present

## 2019-07-11 DIAGNOSIS — E785 Hyperlipidemia, unspecified: Secondary | ICD-10-CM

## 2019-07-11 NOTE — Patient Instructions (Signed)
Continue aspirin 81 mg daily  and Lipitor for secondary stroke prevention  Continue to follow up with PCP regarding cholesterol and blood pressure management   Loop recorder will continue to be monitored for possible atrial fibrillation  Recommend following up with your PCP in regards to confirmation of syphilis diagnosis during hospitalization  Continue current exercises at home with ongoing great improvement!  Keep up the good work!!  Continue to monitor blood pressure at home  Maintain strict control of hypertension with blood pressure goal below 130/90, diabetes with hemoglobin A1c goal below 6.5% and cholesterol with LDL cholesterol (bad cholesterol) goal below 70 mg/dL. I also advised the patient to eat a healthy diet with plenty of whole grains, cereals, fruits and vegetables, exercise regularly and maintain ideal body weight.  Followup in the future with me in 6 months or call earlier if needed       Thank you for coming to see [place] at [facility]. I hope we have been able to provide you high quality care today.  You may receive a patient satisfaction survey over the next few weeks. We would appreciate your feedback and comments so that we may continue to improve ourselves and the health of our patients.

## 2019-07-11 NOTE — Progress Notes (Signed)
[name] [address] [name]. [name] [address] [phone]       STROKE FOLLOW UP NOTE  [name] Date of Birth:  10-11-1955 Medical Record Number:  [id number]   Reason for Referral: stroke follow up    CHIEF COMPLAINT:  Chief Complaint  Patient presents with  . Follow-up    RM9. alone. No questions nor concerns.    HPI: Stroke admission 02/12/2018: [name] is a 64 y.o. male with history of previous stroke, hyperlipidemia, remote syphilis, hypertension  and multiple embolic strokes on 02/12/2018.  He was referred to our office by his PCP as he did not have follow-up post hospital discharge.  He presented to ED with slurred speech, follows and dragging of left foot.  MRI head showed multiple areas of acute infarct in the left anterior cerebral artery and left middle cerebral artery territory suggesting emboli along with chronic bilateral pontine, left thalamic and right ACA punctate infarcts.  CTA head/neck showed 50% carotic siphon narrowing bilaterally along with distal basilar and left P1 moderate stenosis.  2D echo unremarkable.  Lower extremity venous Dopplers negative for DVT.  TEE negative for PFO or cardiac source of emboli.  LDL 76.  A1c 5.3.  Hypercoagulable and autoimmune work-up negative.  Recommended DAPT for 3 weeks then Plavix alone as previously on aspirin.  History of syphilis and gonorrhea with reports of penicillin treatment.  HTN stable.  Recommend continuation of atorvastatin 40 mg daily.  Other stroke risk factors include former tobacco use, EtOH use, obesity and prior stroke on imaging.  Recommended 30-day cardiac event monitor outpatient to rule out atrial fibrillation.  Residual deficits of right facial droop, mild right grip weakness and right leg monoplegia and discharged to SNF for ongoing therapy.  Initial visit 03/30/2019: [name] is a 64 year old male who was referred to our office to follow-up regarding prior stroke  because he did not have hospital discharge follow-up.  He has recently been discharged from nursing home and currently living in a apartment.  He does have supervision by a family friend and mother is in the process of acquiring medical aid to assist with medications, bathing and dressing.  He has difficulty with bathing and dressing independently due to residual stroke deficits.  Residual mild RLE weakness with great improvement from hospital discharge.  He continues to use rolling walker to assist with ambulation and denies any recent falls.  He also has residual dysarthria with speech hesitancy but denies cognitive or memory impairment.  His mother is concerned regarding his mood with frequent outbursts.  Patient states this is because his family is constantly telling him what he should and should not be doing and this frustrates him.  He denies depression or anxiety.  He has continued on aspirin and Plavix despite 3-week DAPT duration but denies bleeding or bruising.  Continues on atorvastatin without myalgias. Recent lipid panel obtained by PCP on 03/17/2019 showed LDL 82.  Also obtain A1c at 4.9.  Blood pressure today 144/89.  Patient and mother had multiple questions regarding diagnosis of prior syphilis infection during hospitalization and if syphilis causes stroke.  Neurosyphilis ruled out during admission with CSF which was discussed with patient.  Recommended to undergo 30-day cardiac event monitor to rule out A. fib at discharge but this is not been completed.  No further concerns at this time.  Update 07/11/2019: [name] is a 64 year old male who is being seen today for stroke follow-up.  Residual stroke deficits of  mild RLE weakness and dysarthria but improving.  He has been more independent with daily activities independently and continues to due exercises daily.  He does continue to ambulate with assistive device as needed based on chronic bilateral knee pain where some days are better than  others.  Denies recent falls.  Continues on aspirin 81 mg daily and atorvastatin 40 mg daily for secondary stroke prevention without side effects.  Blood pressure today 134/79.  At prior visit, referral to cardiology regarding recommended cardiac monitor with evaluation on 04/26/2019 and was referred to Dr. Sallyanne Kuster for possible placement of loop recorder which was placed on 05/16/2019.  Loop recorder is not shown atrial fibrillation thus far.  He continues to question of prior syphilis diagnosis during hospitalization and is questioning if he actually does have syphilis and if not, if diagnosis can be removed from his chart.  Denies new or worsening stroke/TIA symptoms.        ROS:   14 system review of systems performed and negative with exception of weakness, speech impairment and joint pain  PMH:  Past Medical History:  Diagnosis Date  . Arthritis   . High cholesterol   . Hypertension     PSH:  Past Surgical History:  Procedure Laterality Date  . TEE WITHOUT CARDIOVERSION N/A 02/16/2018   Procedure: TRANSESOPHAGEAL ECHOCARDIOGRAM (TEE);  Surgeon: Buford Dresser, MD;  Location: [facility] ENDOSCOPY;  Service: Cardiovascular;  Laterality: N/A;    Social History:  Social History   Socioeconomic History  . Marital status: Single    Spouse name: Not on file  . Number of children: Not on file  . Years of education: Not on file  . Highest education level: Not on file  Occupational History  . Not on file  Tobacco Use  . Smoking status: Former Research scientist (life sciences)  . Smokeless tobacco: Never Used  Substance and Sexual Activity  . Alcohol use: Not Currently  . Drug use: Not Currently    Comment: hx marijuana use  . Sexual activity: Not Currently  Other Topics Concern  . Not on file  Social History Narrative  . Not on file   Social Determinants of Health   Financial Resource Strain:   . Difficulty of Paying Living Expenses: Not on file  Food Insecurity:   . Worried About Ship broker in the Last Year: Not on file  . Ran Out of Food in the Last Year: Not on file  Transportation Needs:   . Lack of Transportation (Medical): Not on file  . Lack of Transportation (Non-Medical): Not on file  Physical Activity:   . Days of Exercise per Week: Not on file  . Minutes of Exercise per Session: Not on file  Stress:   . Feeling of Stress : Not on file  Social Connections:   . Frequency of Communication with Friends and Family: Not on file  . Frequency of Social Gatherings with Friends and Family: Not on file  . Attends Religious Services: Not on file  . Active Member of Clubs or Organizations: Not on file  . Attends Archivist Meetings: Not on file  . Marital Status: Not on file  Intimate Partner Violence:   . Fear of Current or Ex-Partner: Not on file  . Emotionally Abused: Not on file  . Physically Abused: Not on file  . Sexually Abused: Not on file    Family History:  Family History  Problem Relation Age of Onset  . Healthy Mother   .  Heart attack Father   . Diabetes Brother     Medications:   Current Outpatient Medications on File Prior to Visit  Medication Sig Dispense Refill  . amLODipine (NORVASC) 5 MG tablet TAKE 1 TABLET BY MOUTH DAILY TO LOWER BLOOD PRESSURE 30 tablet 2  . aspirin EC 81 MG tablet Take 1 tablet (81 mg total) by mouth daily. 90 tablet 3  . atorvastatin (LIPITOR) 40 MG tablet TAKE 1 TABLET BY MOUTH DAILY TO LOWER CHOLESTEROL 30 tablet 2  . calcium-vitamin D (OSCAL WITH D) 500-200 MG-UNIT tablet Take 1 tablet by mouth. OTC    . lisinopril-hydrochlorothiazide (ZESTORETIC) 20-25 MG tablet TAKE 2 TABLETS BY MOUTH DAILY TO LOWER BLOOD PRESSURE 180 tablet 0  . Multiple Vitamins-Minerals (MENS MULTIVITAMIN PLUS PO) Take by mouth. OTC    . naproxen sodium (ALEVE) 220 MG tablet Take 220 mg by mouth. OTC PRN    . vitamin B-12 (CYANOCOBALAMIN) 500 MCG tablet Take 500 mcg by mouth daily. OTC    . clopidogrel (PLAVIX) 75 MG tablet Take 1  tablet (75 mg total) by mouth daily.     No current facility-administered medications on file prior to visit.    Allergies:   Allergies  Allergen Reactions  . Hydrocodone     Pt stated, "I am not allergic, I do not want to take this medicine ever" Codeine - "I do not want to ever take this medicine - not allergic"  . Oxycodone     Pt stated, "I am not allergic, I do not want to take this medicine ever"     Physical Exam  Vitals:   07/11/19 1347  BP: 134/79  Pulse: 62  Temp: (!) 97.5 F (36.4 C)  Weight: 273 lb (123.8 kg)  Height: 5\' 9"  (1.753 m)   Body mass index is 40.32 kg/m. No exam data present  General: Very pleasant pleasant obese African American middle aged male, seated, in no evident distress Head: head normocephalic and atraumatic.   Neck: supple with no carotid or supraclavicular bruits Cardiovascular: regular rate and rhythm, no murmurs Musculoskeletal: no deformity Skin:  no rash/petichiae Vascular:  Normal pulses all extremities   Neurologic Exam Mental Status: Awake and fully alert. Mild dysarthria with speech hesitancy. Oriented to place and time. Recent and remote memory intact. Attention span, concentration and fund of knowledge appropriate. Mood and affect appropriate.  Cranial Nerves: Pupils equal, briskly reactive to light. Extraocular movements full without nystagmus. Visual fields full to confrontation. Hearing intact. Facial sensation intact. Face, tongue, palate moves normally and symmetrically.  Motor: Normal bulk and tone. Normal strength in all tested extremity muscles except mild weakness right hip flexor.  No residual weakness in right ankle dorsiflexion Sensory.: intact to touch , pinprick , position and vibratory sensation.  Coordination: Rapid alternating movements normal in all extremities. Finger-to-nose performed accurately bilaterally and heel-to-shin difficulty performing due to bilateral knee pain. Gait and Station: Arises from chair  without difficulty. Stance is normal. Abnormal gait with short slow steps and stiff and likes with use of rolling walker Reflexes: 1+ and symmetric. Toes downgoing.       ASSESSMENT: Robert Donovan is a 64 y.o. year old male with PMH of prior stroke, HTN, HLD, morbid obesity, osteoarthritis and multifocal left-sided embolic strokes on 02/10/2018.  He was referred to our office by his PCP as he did not follow-up in our office at hospital discharge due to residing in a nursing home setting since hospital discharge and recently discharged from  nursing home currently living independently.  Residual stroke deficits of dysarthria and mild RLE weakness with ongoing improvement.  Recently had loop recorder placed for monitoring of possible A. fib as stroke etiology.    PLAN:  1. Multifocal embolic strokes: Continue aspirin 81 mg daily  and atorvastatin for secondary stroke prevention. Maintain strict control of hypertension with blood pressure goal below 130/90, diabetes with hemoglobin A1c goal below 6.5% and cholesterol with LDL cholesterol (bad cholesterol) goal below 70 mg/dL.  I also advised the patient to eat a healthy diet with plenty of whole grains, cereals, fruits and vegetables, exercise regularly with at least 30 minutes of continuous activity daily and maintain ideal body weight.  Continue to monitor loop recorder for possible atrial fibrillation. 2. HTN: Advised to continue current treatment regimen.  Today's BP stable.  Advised to continue to monitor at home along with continued follow-up with PCP for management 3. HLD: Advised to continue current treatment regimen along with continued follow-up with PCP for future prescribing and monitoring of lipid panel 4. ?  Syphilis: Patient denies prior history and is questioning repeat blood work to ensure diagnosis and if negative, for diagnosis to be removed from chart.  He is currently asymptomatic.  Advised patient to follow-up with PCP for repeat  blood work to ensure correct testing is performed and high likelihood of insurance not covering testing from a neurological office.    Follow up in 6 months or call earlier if needed   Greater than 50% of time during this 20 minute visit was spent on counseling, explanation of diagnosis of multifocal embolic strokes, reviewing risk factor management of HTN, and HLD, discussion regarding placement of loop recorder and ongoing monitoring/management, discussion regarding prior diagnosis of syphilis and possible confirmation testing, planning of further management along with potential future management, and discussion with patient answered all questions to satisfaction    Ihor Austin, AGNP-BC  [facility] [address] [place], [place] [address]  Phone [phone] Fax [phone] Note: This document was prepared with digital dictation and possible smart phrase technology. Any transcriptional errors that result from this process are unintentional.

## 2019-07-12 NOTE — Progress Notes (Signed)
I agree with the above plan 

## 2019-07-21 ENCOUNTER — Ambulatory Visit (INDEPENDENT_AMBULATORY_CARE_PROVIDER_SITE_OTHER): Payer: Medicaid Other | Admitting: *Deleted

## 2019-07-21 DIAGNOSIS — I639 Cerebral infarction, unspecified: Secondary | ICD-10-CM | POA: Diagnosis not present

## 2019-07-22 LAB — CUP PACEART REMOTE DEVICE CHECK
Date Time Interrogation Session: 20210218230109
Implantable Pulse Generator Implant Date: 20201214

## 2019-08-04 ENCOUNTER — Ambulatory Visit: Payer: Medicaid Other | Admitting: Adult Health

## 2019-08-05 ENCOUNTER — Encounter: Payer: Self-pay | Admitting: Family Medicine

## 2019-08-05 ENCOUNTER — Other Ambulatory Visit: Payer: Self-pay

## 2019-08-05 ENCOUNTER — Ambulatory Visit: Payer: Medicaid Other | Attending: Family Medicine | Admitting: Family Medicine

## 2019-08-05 VITALS — BP 122/74 | HR 78 | Temp 98.6°F | Resp 18 | Ht 69.0 in | Wt 269.0 lb

## 2019-08-05 DIAGNOSIS — I69351 Hemiplegia and hemiparesis following cerebral infarction affecting right dominant side: Secondary | ICD-10-CM | POA: Diagnosis not present

## 2019-08-05 DIAGNOSIS — Z7901 Long term (current) use of anticoagulants: Secondary | ICD-10-CM | POA: Diagnosis not present

## 2019-08-05 DIAGNOSIS — I739 Peripheral vascular disease, unspecified: Secondary | ICD-10-CM | POA: Diagnosis not present

## 2019-08-05 DIAGNOSIS — E782 Mixed hyperlipidemia: Secondary | ICD-10-CM | POA: Diagnosis not present

## 2019-08-05 DIAGNOSIS — I69398 Other sequelae of cerebral infarction: Secondary | ICD-10-CM | POA: Diagnosis not present

## 2019-08-05 DIAGNOSIS — Z1211 Encounter for screening for malignant neoplasm of colon: Secondary | ICD-10-CM | POA: Diagnosis not present

## 2019-08-05 DIAGNOSIS — L602 Onychogryphosis: Secondary | ICD-10-CM

## 2019-08-05 DIAGNOSIS — Z7982 Long term (current) use of aspirin: Secondary | ICD-10-CM | POA: Diagnosis not present

## 2019-08-05 DIAGNOSIS — I10 Essential (primary) hypertension: Secondary | ICD-10-CM | POA: Diagnosis present

## 2019-08-05 DIAGNOSIS — Z8249 Family history of ischemic heart disease and other diseases of the circulatory system: Secondary | ICD-10-CM | POA: Diagnosis not present

## 2019-08-05 DIAGNOSIS — Z79899 Other long term (current) drug therapy: Secondary | ICD-10-CM

## 2019-08-05 DIAGNOSIS — Z87891 Personal history of nicotine dependence: Secondary | ICD-10-CM | POA: Insufficient documentation

## 2019-08-05 DIAGNOSIS — M1990 Unspecified osteoarthritis, unspecified site: Secondary | ICD-10-CM | POA: Insufficient documentation

## 2019-08-05 DIAGNOSIS — R269 Unspecified abnormalities of gait and mobility: Secondary | ICD-10-CM

## 2019-08-05 DIAGNOSIS — Z7902 Long term (current) use of antithrombotics/antiplatelets: Secondary | ICD-10-CM

## 2019-08-05 DIAGNOSIS — K089 Disorder of teeth and supporting structures, unspecified: Secondary | ICD-10-CM | POA: Diagnosis not present

## 2019-08-05 NOTE — Progress Notes (Signed)
Subjective:  Patient ID: Robert Donovan, male    DOB: [birth year]  Age: 64 y.o. 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  Patient was last admitted to the hospital in September 2019 due to recurrence of embolic CVA.  After discharge from the hospital, he spent 1 year in assisted living but now has his own apartment.  He reports that he is doing well but has some difficulty with ADLs but has an aide who comes to help.  He reports no new strokelike symptoms.  He continues to have issues with gait due to his prior CVAs with residual right-sided weakness.  Patient has also had issues in the past with long, painful, thickened toenails and did see podiatry for trimming but states that the nails are starting to grow back and he will need another referral to podiatry for follow-up.  He reports that he is taking his medications on a daily basis.  He denies any headaches or dizziness related to his blood pressure.  He continues to take blood thinning medications and denies any issues with unusual bruising or bleeding.  He reports that he is not sure if he has had a prior colonoscopy.  He has noticed no blood in the stool and no black stools.  He would also like to have a referral to see a dentist as he reports that he has poor dentition due to poor dental care when he was homeless.  He does not feel as if he has had any increased muscle/joint pain related to his use of atorvastatin.  Past Medical History:  Diagnosis Date  . Arthritis   . High cholesterol   . Hypertension     Past Surgical History:  Procedure Laterality Date  . TEE WITHOUT CARDIOVERSION N/A 02/16/2018   Procedure: TRANSESOPHAGEAL ECHOCARDIOGRAM (TEE);  Surgeon: Buford Dresser, MD;  Location: [facility] ENDOSCOPY;   Service: Cardiovascular;  Laterality: N/A;    Family History  Problem Relation Age of Onset  . Healthy Mother   . Heart attack Father   . Diabetes Brother     Social History   Tobacco Use  . Smoking status: Former Research scientist (life sciences)  . Smokeless tobacco: Never Used  Substance Use Topics  . Alcohol use: Not Currently    ROS Review of Systems  Constitutional: Positive for fatigue (mild). Negative for chills and fever.  Eyes: Negative for photophobia and visual disturbance.  Respiratory: Negative for cough and shortness of breath.   Cardiovascular: Negative for chest pain and palpitations.  Gastrointestinal: Negative for abdominal pain, blood in stool, constipation, diarrhea and nausea.  Endocrine: Negative for polydipsia, polyphagia and polyuria.  Genitourinary: Negative for dysuria and frequency.  Musculoskeletal: Negative for arthralgias and back pain.  Skin: Negative for rash and wound.       Painful toenails  Neurological: Negative for dizziness and headaches.  Hematological: Negative for adenopathy. Does not bruise/bleed easily.  Psychiatric/Behavioral: Negative for self-injury and suicidal ideas. The patient is not nervous/anxious.     Objective:   Today's Vitals: BP 122/74 (BP Location: Left Arm, Patient Position: Sitting, Cuff Size: Large)   Pulse 78   Temp 98.6 F (37 C) (Oral)   Resp 18   Ht 5\' 9"  (1.753 m)   Wt 269 lb (122 kg)   SpO2 100%   BMI 39.72 kg/m  Physical Exam Vitals and nursing note reviewed.  Constitutional:      General: He is not in acute distress.    Appearance: Normal appearance. He is obese.     Comments: Well-nourished well-developed obese older male in no acute distress sitting on a chair in exam room with wheeled walker nearby  Neck:     Vascular: No carotid bruit.  Cardiovascular:     Rate and Rhythm: Normal rate and regular rhythm.     Comments: Poorly palpable distal lower extremity pulses Pulmonary:     Effort: Pulmonary effort is  normal.     Breath sounds: Normal breath sounds.  Abdominal:     Palpations: Abdomen is soft.     Tenderness: There is no abdominal tenderness. There is no right CVA tenderness, left CVA tenderness, guarding or rebound.  Musculoskeletal:        General: No tenderness.     Cervical back: Normal range of motion and neck supple. No tenderness.     Right lower leg: Edema present.     Left lower leg: Edema present.     Comments: Mild nonpitting, distal lower extremity edema  Lymphadenopathy:     Cervical: No cervical adenopathy.  Skin:    General: Skin is warm and dry.     Comments: Thickened, slightly long toenails.  Neurological:     Mental Status: He is alert and oriented to person, place, and time.     Motor: Weakness (4/5 right lower extremity weakness) present.     Gait: Gait abnormal.  Psychiatric:        Mood and Affect: Mood normal.        Behavior: Behavior normal.     Assessment & Plan:  1. Essential hypertension Blood pressure stable and controlled at today's visit.  He is encouraged to continue the use of his lisinopril hydrochlorothiazide and amlodipine.  Also continue low-sodium diet and efforts at weight loss.  2. Abnormality of gait as late effect of cerebrovascular accident (CVA) Continue use of assistive device to help prevent falls.  Continue exercises as per physical therapy  3. Mixed hyperlipidemia Continue atorvastatin and a healthy, low-fat diet - Comprehensive metabolic panel  4. Poor dentition Referral to dentistry in follow-up of poor dentition and patient with history of embolic strokes with unknown source - Ambulatory referral to Dentistry  5. Long term (current) use of antithrombotics/antiplatelets Comprehensive metabolic panel in follow-up of long-term use of aspirin therapy status post CVA - CBC  6. Encounter for long-term current use of medication Comprehensive metabolic panel in follow-up of long-term use of multiple medications for treatment  of hypertension and hyperlipidemia/statin therapy - Comprehensive metabolic panel  7. Screening for colon cancer GI referral for screening colonoscopy - Ambulatory referral to Gastroenterology  8. Thickened nails Referral placed to podiatry in follow-up of patient's issues with painful, thickened toenails - Ambulatory referral to Podiatry  9. PVD (peripheral vascular disease) (HCC) Patient with peripheral vascular disease and he is advised not to try and trim his own nails due to the risk of poor healing risk if he develops a wound.  He is to continue the use of daily aspirin and atorvastatin.  He will have CBC and comprehensive metabolic panel in follow-up of aspirin use and use of statin medication - Ambulatory referral to Podiatry - CBC - Comprehensive metabolic panel   Outpatient Encounter Medications as of 08/05/2019  Medication Sig  . amLODipine (NORVASC) 5 MG tablet TAKE 1 TABLET BY MOUTH DAILY TO  LOWER BLOOD PRESSURE  . aspirin EC 81 MG tablet Take 1 tablet (81 mg total) by mouth daily.  Marland Kitchen atorvastatin (LIPITOR) 40 MG tablet TAKE 1 TABLET BY MOUTH DAILY TO LOWER CHOLESTEROL  . calcium-vitamin D (OSCAL WITH D) 500-200 MG-UNIT tablet Take 1 tablet by mouth. OTC  . lisinopril-hydrochlorothiazide (ZESTORETIC) 20-25 MG tablet TAKE 2 TABLETS BY MOUTH DAILY TO LOWER BLOOD PRESSURE  . Multiple Vitamins-Minerals (MENS MULTIVITAMIN PLUS PO) Take by mouth. OTC  . vitamin B-12 (CYANOCOBALAMIN) 500 MCG tablet Take 500 mcg by mouth daily. OTC  . naproxen sodium (ALEVE) 220 MG tablet Take 220 mg by mouth. OTC PRN   No facility-administered encounter medications on file as of 08/05/2019.    An After Visit Summary was printed and given to the patient.   Follow-up: Return in about 4 months (around 12/05/2019) for chronic issues; sooner if needed.   40+ minutes spent in face-to-face time with the patient obtaining his history of present illness/status of current illnesses, review of systems,  performing physical examination, discussing assessment of today's medical issues and the treatment plan as well as entering orders for labs and specialty follow-up.  Additional time of approximately 10-12 minutes spent on review of chart and completion of today's visit note.  Cain Saupe MD

## 2019-08-06 LAB — CBC
Hematocrit: 41.7 % (ref 37.5–51.0)
Hemoglobin: 13.8 g/dL (ref 13.0–17.7)
MCH: 28.3 pg (ref 26.6–33.0)
MCHC: 33.1 g/dL (ref 31.5–35.7)
MCV: 86 fL (ref 79–97)
Platelets: 282 x10E3/uL (ref 150–450)
RBC: 4.88 x10E6/uL (ref 4.14–5.80)
RDW: 13.1 % (ref 11.6–15.4)
WBC: 13.8 x10E3/uL — ABNORMAL HIGH (ref 3.4–10.8)

## 2019-08-06 LAB — COMPREHENSIVE METABOLIC PANEL WITH GFR
ALT: 9 IU/L (ref 0–44)
AST: 13 IU/L (ref 0–40)
Albumin/Globulin Ratio: 1.4 (ref 1.2–2.2)
Albumin: 4.2 g/dL (ref 3.8–4.8)
Alkaline Phosphatase: 115 IU/L (ref 39–117)
BUN/Creatinine Ratio: 15 (ref 10–24)
BUN: 17 mg/dL (ref 8–27)
Bilirubin Total: 1.1 mg/dL (ref 0.0–1.2)
CO2: 23 mmol/L (ref 20–29)
Calcium: 10 mg/dL (ref 8.6–10.2)
Chloride: 103 mmol/L (ref 96–106)
Creatinine, Ser: 1.17 mg/dL (ref 0.76–1.27)
GFR calc Af Amer: 77 mL/min/1.73
GFR calc non Af Amer: 67 mL/min/1.73
Globulin, Total: 2.9 g/dL (ref 1.5–4.5)
Glucose: 86 mg/dL (ref 65–99)
Potassium: 4.1 mmol/L (ref 3.5–5.2)
Sodium: 139 mmol/L (ref 134–144)
Total Protein: 7.1 g/dL (ref 6.0–8.5)

## 2019-08-22 ENCOUNTER — Ambulatory Visit (INDEPENDENT_AMBULATORY_CARE_PROVIDER_SITE_OTHER): Payer: Medicaid Other | Admitting: *Deleted

## 2019-08-22 DIAGNOSIS — I639 Cerebral infarction, unspecified: Secondary | ICD-10-CM

## 2019-08-22 LAB — CUP PACEART REMOTE DEVICE CHECK
Date Time Interrogation Session: 20210321230454
Implantable Pulse Generator Implant Date: 20201214

## 2019-08-22 NOTE — Progress Notes (Signed)
ILR Remote 

## 2019-09-12 ENCOUNTER — Ambulatory Visit: Payer: Medicaid Other | Admitting: Podiatrist

## 2019-09-12 ENCOUNTER — Other Ambulatory Visit: Payer: Self-pay

## 2019-09-12 ENCOUNTER — Encounter: Payer: Self-pay | Admitting: Podiatrist

## 2019-09-12 DIAGNOSIS — M79674 Pain in right toe(s): Secondary | ICD-10-CM | POA: Diagnosis not present

## 2019-09-12 DIAGNOSIS — I739 Peripheral vascular disease, unspecified: Secondary | ICD-10-CM

## 2019-09-12 DIAGNOSIS — M79675 Pain in left toe(s): Secondary | ICD-10-CM | POA: Diagnosis not present

## 2019-09-12 DIAGNOSIS — B351 Tinea unguium: Secondary | ICD-10-CM

## 2019-09-12 NOTE — Patient Instructions (Signed)
Foot care is an important part of your health, especially when you have diabetes. Diabetes may cause you to have problems because of poor blood flow (circulation) to your feet and legs, which can cause your skin to:  Become thinner and drier.  How to care for your feet Foot hygiene  Wash your feet daily with warm water and mild soap. Do not use hot water. Then, pat your feet and the areas between your toes until they are completely dry. Do not soak your feet as this can dry your skin.  Trim your toenails straight across. Do not dig under them or around the cuticle. File the edges of your nails with an emery board or nail file.  Apply a moisturizing lotion or petroleum jelly to the skin on your feet and to dry, brittle toenails. Use lotion that does not contain alcohol and is unscented. Do not apply lotion between your toes. Shoes and socks  Wear clean socks or stockings every day. Make sure they are not too tight. Do not wear knee-high stockings since they may decrease blood flow to your legs.  Wear shoes that fit properly and have enough cushioning. Always look in your shoes before you put them on to be sure there are no objects inside.  To break in new shoes, wear them for just a few hours a day. This prevents injuries on your feet. Wounds, scrapes, corns, and calluses  Check your feet daily for blisters, cuts, bruises, sores, and redness. If you cannot see the bottom of your feet, use a mirror or ask someone for help.  Do not cut corns or calluses or try to remove them with medicine.  If you find a minor scrape, cut, or break in the skin on your feet, keep it and the skin around it clean and dry. You may clean these areas with mild soap and water. Do not clean the area with peroxide, alcohol, or iodine.  If you have a wound, scrape, corn, or callus on your foot, look at it several times a day to make sure it is healing and not infected. Check for: ? Redness, swelling, or  pain. ? Fluid or blood. ? Warmth. ? Pus or a bad smell. General instructions  Do not cross your legs. This may decrease blood flow to your feet.  Do not use heating pads or hot water bottles on your feet. They may burn your skin. If you have lost feeling in your feet or legs, you may not know this is happening until it is too late.  Protect your feet from hot and cold by wearing shoes, such as at the beach or on hot pavement.  Schedule a complete foot exam at least once a year (annually) or more often if you have foot problems. If you have foot problems, report any cuts, sores, or bruises to your health care provider immediately. Contact a health care provider if:  You have a medical condition that increases your risk of infection and you have any cuts, sores, or bruises on your feet.  You have an injury that is not healing.  You have redness on your legs or feet.  You feel burning or tingling in your legs or feet.  You have pain or cramps in your legs and feet.  Your legs or feet are numb.  Your feet always feel cold.  You have pain around a toenail. Get help right away if:  You have a wound, scrape, corn, or callus on your  foot and: ? You have pain, swelling, or redness that gets worse. ? You have fluid or blood coming from the wound, scrape, corn, or callus. ? Your wound, scrape, corn, or callus feels warm to the touch. ? You have pus or a bad smell coming from the wound, scrape, corn, or callus. ? You have a fever. ? You have a red line going up your leg. Summary  Check your feet every day for cuts, sores, red spots, swelling, and blisters.  Moisturize feet and legs daily.  Wear shoes that fit properly and have enough cushioning.  If you have foot problems, report any cuts, sores, or bruises to your health care provider immediately.  Schedule a complete foot exam at least once a year (annually) or more often if you have foot problems. This information is not  intended to replace advice given to you by your health care provider. Make sure you discuss any questions you have with your health care provider. Document Revised: 02/09/2019 Document Reviewed: 06/20/2016 Elsevier Patient Education  [place].

## 2019-09-14 ENCOUNTER — Telehealth: Payer: Self-pay | Admitting: Family Medicine

## 2019-09-14 DIAGNOSIS — N182 Chronic kidney disease, stage 2 (mild): Secondary | ICD-10-CM

## 2019-09-14 DIAGNOSIS — I129 Hypertensive chronic kidney disease with stage 1 through stage 4 chronic kidney disease, or unspecified chronic kidney disease: Secondary | ICD-10-CM

## 2019-09-14 DIAGNOSIS — I6523 Occlusion and stenosis of bilateral carotid arteries: Secondary | ICD-10-CM

## 2019-09-14 MED ORDER — ATORVASTATIN CALCIUM 40 MG PO TABS: ORAL_TABLET | ORAL | 0 refills | Status: DC

## 2019-09-14 MED ORDER — LISINOPRIL-HYDROCHLOROTHIAZIDE 20-25 MG PO TABS: 2.0000 | ORAL_TABLET | Freq: Every day | ORAL | 0 refills | Status: DC

## 2019-09-14 MED ORDER — AMLODIPINE BESYLATE 5 MG PO TABS: ORAL_TABLET | ORAL | 0 refills | Status: DC

## 2019-09-14 NOTE — Telephone Encounter (Signed)
Rx sent 

## 2019-09-14 NOTE — Telephone Encounter (Signed)
1) Medication(s) Requested (by name):lisinopril-hydrochlorothiazide (ZESTORETIC) 20-25 MG tablet atorvastatin (LIPITOR) 40 MG tablet amLODipine (NORVASC) 5 MG tablet    2) Pharmacy of Choice:[facility] [phone] [place], [place] - [address] [facility] [address]  [address] [place] [id number]   3) Special Requests:   Approved medications will be sent to the pharmacy, we will reach out if there is an issue.  Requests made after 3pm may not be addressed until the following business day!  If a patient is unsure of the name of the medication(s) please note and ask patient to call back when they are able to provide all info, do not send to responsible party until all information is available!

## 2019-09-18 NOTE — Progress Notes (Signed)
This patient returns to the office for at risk foot care.  This patient requires this care by a professional since this patient will be at risk due to having swelling, peripheral angiopathy and symtomatic toenails.   These nails are painful walking and wearing shoes.  This patient presents for at risk foot care today.  General Appearance  Alert, conversant and in no acute stress.  Vascular  Dorsalis pedis palpable and posterior tibial  pulses are non palpable  bilaterally.  Capillary return is within normal limits  bilaterally. Temperature is within normal limits  Bilaterally. Bilateral swelling is present   Neurologic  Senn-Weinstein monofilament wire test within normal limits  bilaterally. Muscle power within normal limits bilaterally.  Nails Thick disfigured discolored nails with subungual debris  from hallux to fifth toes bilaterally. No evidence of bacterial infection or drainage bilaterally.  Orthopedic  No limitations of motion  feet .  No crepitus or effusions noted.  No bony pathology or digital deformities noted.  Skin  normotropic skin with no porokeratosis noted bilaterally.  No signs of infections or ulcers noted.    Interdigital maceration is present with bilateral interspaces.   Assessment:  Onychomycosis  Pain in right toes  Pain in left toes, peripheral angiopathy with swelling bilateral.   Plan: Consent was obtained for treatment procedures.   Mechanical debridement of nails 1-5  bilaterally performed with a nail nipper.  Filed with dremel without incident.    Return office visit   3 months.    Told patient to return for periodic foot care and evaluation due to potential at risk complications.

## 2019-09-22 ENCOUNTER — Ambulatory Visit (INDEPENDENT_AMBULATORY_CARE_PROVIDER_SITE_OTHER): Payer: Medicaid Other | Admitting: *Deleted

## 2019-09-22 DIAGNOSIS — I639 Cerebral infarction, unspecified: Secondary | ICD-10-CM

## 2019-09-22 LAB — CUP PACEART REMOTE DEVICE CHECK
Date Time Interrogation Session: 20210422100031
Implantable Pulse Generator Implant Date: 20201214

## 2019-09-23 NOTE — Progress Notes (Signed)
ILR Remote 

## 2019-10-24 LAB — CUP PACEART REMOTE DEVICE CHECK
Date Time Interrogation Session: 20210522230517
Implantable Pulse Generator Implant Date: 20201214

## 2019-10-25 ENCOUNTER — Ambulatory Visit (INDEPENDENT_AMBULATORY_CARE_PROVIDER_SITE_OTHER): Payer: Medicaid Other | Admitting: *Deleted

## 2019-10-25 DIAGNOSIS — I6340 Cerebral infarction due to embolism of unspecified cerebral artery: Secondary | ICD-10-CM

## 2019-10-25 NOTE — Progress Notes (Signed)
Carelink Summary Report / Loop Recorder 

## 2019-11-28 ENCOUNTER — Ambulatory Visit (INDEPENDENT_AMBULATORY_CARE_PROVIDER_SITE_OTHER): Payer: Medicaid Other | Admitting: *Deleted

## 2019-11-28 DIAGNOSIS — Z8673 Personal history of transient ischemic attack (TIA), and cerebral infarction without residual deficits: Secondary | ICD-10-CM | POA: Diagnosis not present

## 2019-11-28 DIAGNOSIS — Z1211 Encounter for screening for malignant neoplasm of colon: Secondary | ICD-10-CM | POA: Diagnosis not present

## 2019-11-28 DIAGNOSIS — I6340 Cerebral infarction due to embolism of unspecified cerebral artery: Secondary | ICD-10-CM

## 2019-11-28 LAB — CUP PACEART REMOTE DEVICE CHECK
Date Time Interrogation Session: 20210627231103
Implantable Pulse Generator Implant Date: 20201214

## 2019-11-29 NOTE — Progress Notes (Signed)
Carelink Summary Report / Loop Recorder 

## 2019-12-07 ENCOUNTER — Ambulatory Visit: Payer: Medicaid Other | Admitting: Family Medicine

## 2019-12-13 ENCOUNTER — Ambulatory Visit: Payer: Medicaid Other | Admitting: Podiatry

## 2019-12-13 ENCOUNTER — Other Ambulatory Visit: Payer: Self-pay

## 2019-12-13 ENCOUNTER — Encounter: Payer: Self-pay | Admitting: Podiatry

## 2019-12-13 DIAGNOSIS — M79674 Pain in right toe(s): Secondary | ICD-10-CM

## 2019-12-13 DIAGNOSIS — I739 Peripheral vascular disease, unspecified: Secondary | ICD-10-CM

## 2019-12-13 DIAGNOSIS — N189 Chronic kidney disease, unspecified: Secondary | ICD-10-CM | POA: Diagnosis not present

## 2019-12-13 DIAGNOSIS — B351 Tinea unguium: Secondary | ICD-10-CM

## 2019-12-13 DIAGNOSIS — N179 Acute kidney failure, unspecified: Secondary | ICD-10-CM | POA: Diagnosis not present

## 2019-12-13 DIAGNOSIS — M79675 Pain in left toe(s): Secondary | ICD-10-CM

## 2019-12-13 DIAGNOSIS — D689 Coagulation defect, unspecified: Secondary | ICD-10-CM

## 2019-12-13 NOTE — Progress Notes (Signed)
This patient returns to the office for at risk foot care.  This patient requires this care by a professional since this patient will be at risk due to having swelling, peripheral angiopathy acute kidney disease and symtomatic toenails.    These nails are painful walking and wearing shoes.  This patient presents for at risk foot care today.  General Appearance  Alert, conversant and in no acute stress.  Vascular  Dorsalis pedis palpable and posterior tibial  pulses are non palpable  bilaterally.  Capillary return is within normal limits  bilaterally. Temperature is within normal limits  Bilaterally. Bilateral swelling is present   Neurologic  Senn-Weinstein monofilament wire test within normal limits  bilaterally. Muscle power within normal limits bilaterally.  Nails Thick disfigured discolored nails with subungual debris  from hallux to fifth toes bilaterally. No evidence of bacterial infection or drainage bilaterally.  Orthopedic  No limitations of motion  feet .  No crepitus or effusions noted.  No bony pathology or digital deformities noted.  Skin  normotropic skin with no porokeratosis noted bilaterally.  No signs of infections or ulcers noted.    Interdigital maceration is present with bilateral interspaces.   Assessment:  Onychomycosis  Pain in right toes  Pain in left toes, peripheral angiopathy with swelling bilateral.   Plan: Consent was obtained for treatment procedures.   Mechanical debridement of nails 1-5  bilaterally performed with a nail nipper.  Filed with dremel without incident.    Return office visit   3 months.    Told patient to return for periodic foot care and evaluation due to potential at risk complications.  Helane Gunther DPM

## 2019-12-14 ENCOUNTER — Other Ambulatory Visit: Payer: Self-pay | Admitting: Family Medicine

## 2019-12-14 DIAGNOSIS — N182 Chronic kidney disease, stage 2 (mild): Secondary | ICD-10-CM

## 2019-12-14 DIAGNOSIS — I6523 Occlusion and stenosis of bilateral carotid arteries: Secondary | ICD-10-CM

## 2019-12-23 IMAGING — CR DG LUMBAR SPINE COMPLETE 4+V
7 series · 7 of 7 positions shown · non-contrast
Comparison: 09/24/2017

CLINICAL DATA: Per pt, states he fell about a month ago PCP told
him he had arthritis in right knee and hip-states he fell today
injuring his right hip and knee. Pt states he has midline lower back
pain.

EXAM:
LUMBAR SPINE - COMPLETE 4+ VIEW

[t lumbar spine ap]
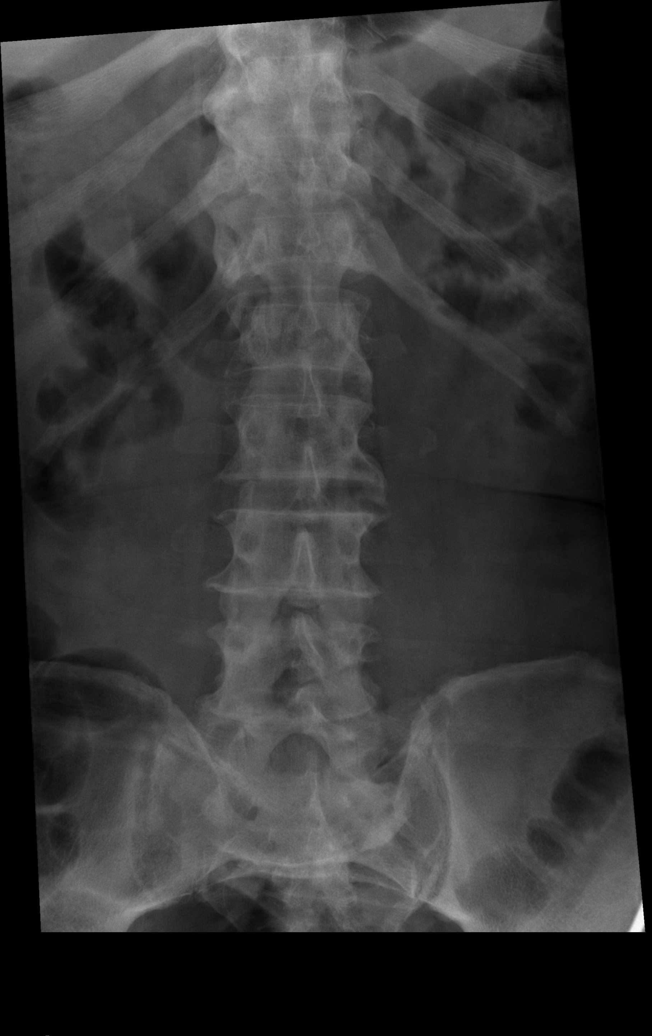

[t lumbar spine obl (1 of 2)]
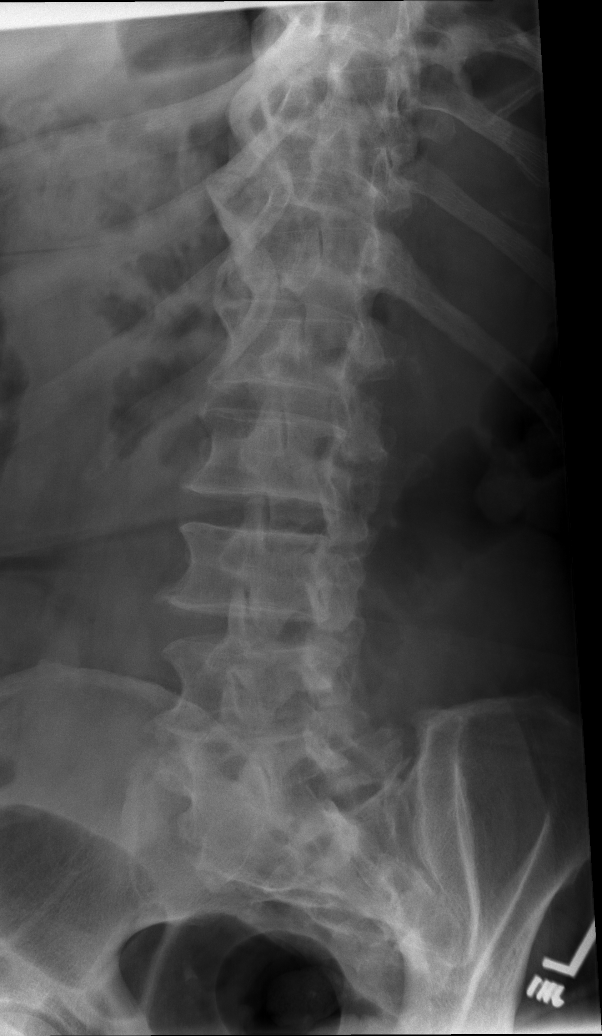

[t lumbar spine obl (2 of 2)]
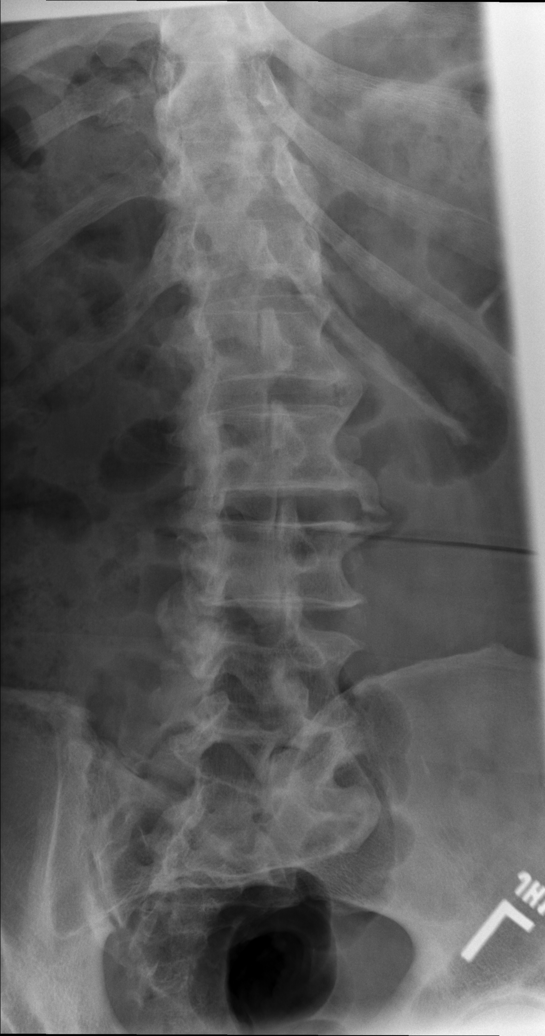

[t lumbar spine lat]
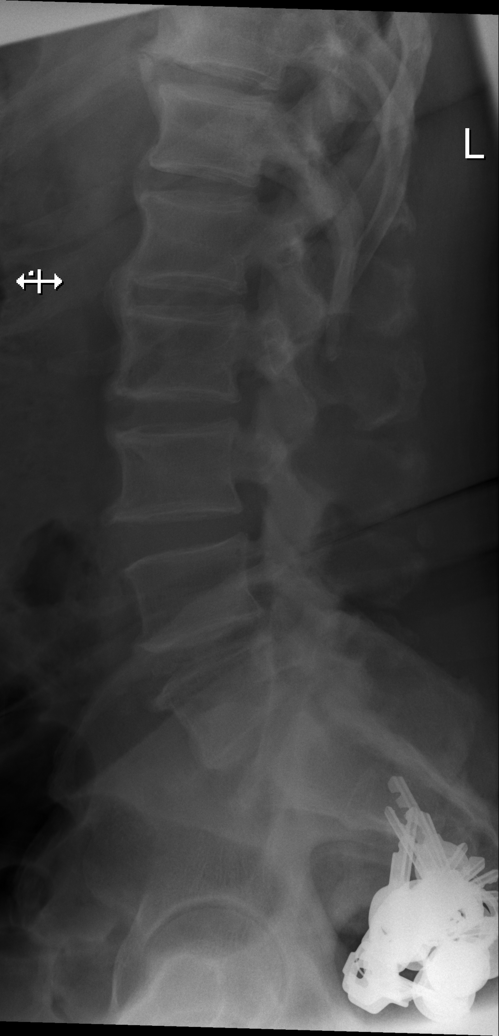

[t lumbar l-5 s-1 spot]
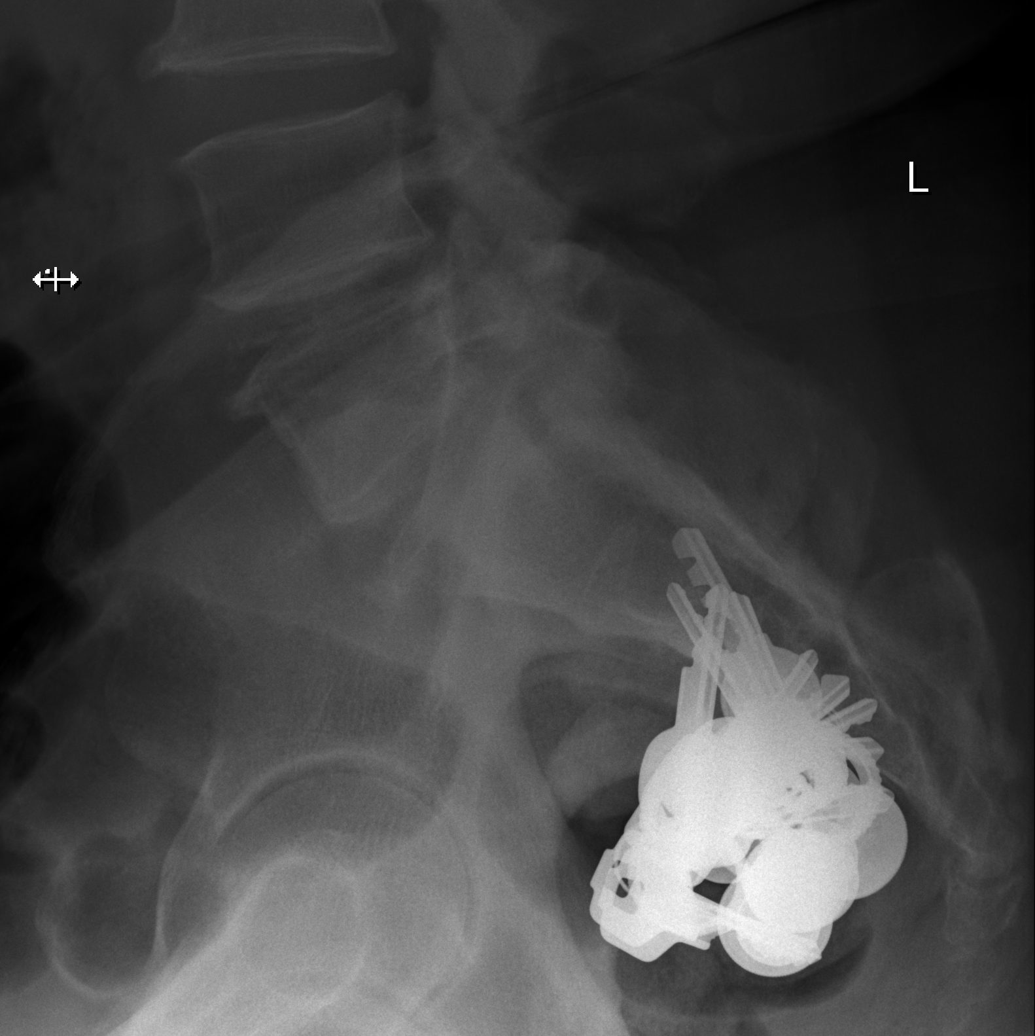

[w lumbar spine lat (1 of 2)]
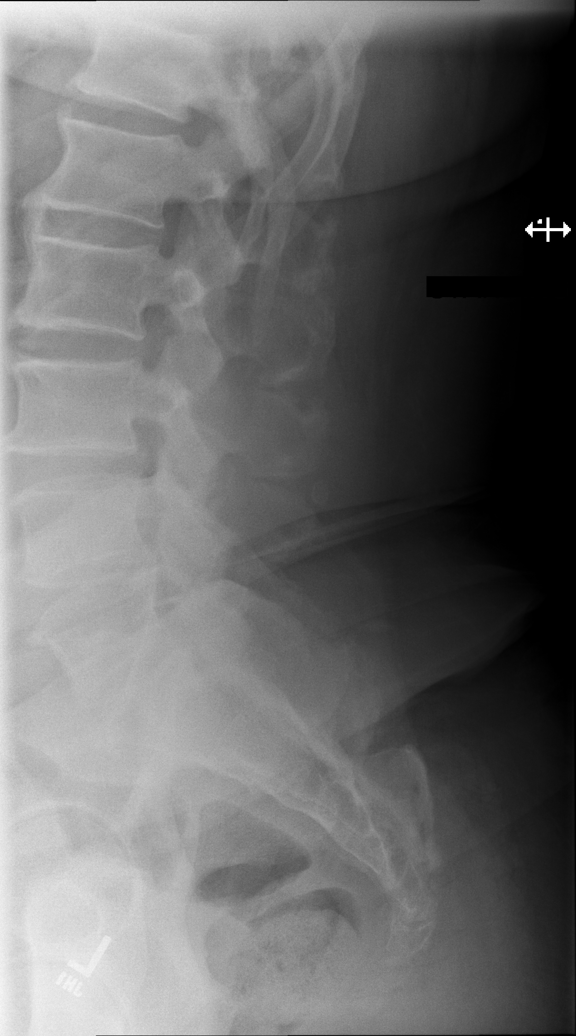

[w lumbar spine lat (2 of 2)]
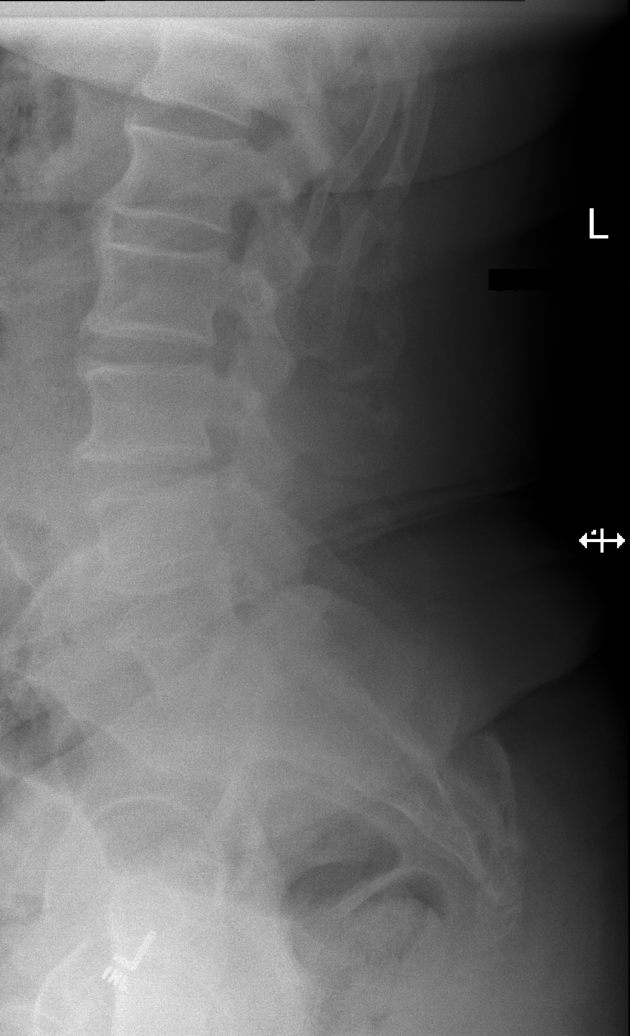

[7 of 7 positions shown; findings below may reference images not displayed]

FINDINGS: There are mild degenerative changes in the LOWER thoracic and lumbar
spine. No acute fracture or subluxation. Visualized bowel gas
pattern is nonobstructive.
IMPRESSION: Degenerative changes.  No evidence for acute  abnormality.

## 2019-12-27 ENCOUNTER — Telehealth: Payer: Self-pay | Admitting: Family Medicine

## 2019-12-27 DIAGNOSIS — I6340 Cerebral infarction due to embolism of unspecified cerebral artery: Secondary | ICD-10-CM

## 2019-12-27 NOTE — Telephone Encounter (Signed)
Pt mother Julieanne Cotton is calling stating son is sch for a colonoscopy on the 29th. She states that he will never be able to take this drink and make it to the bathroom. She is wanting Dr Jillyn Hidden to call in a script for a bedside commode. However she would need it by tomorrow. FU with mother at [phone] or husband's phone [phone].

## 2019-12-27 NOTE — Telephone Encounter (Signed)
I will order a bed side commode, perhaps can be rented while needed then return

## 2019-12-28 ENCOUNTER — Telehealth: Payer: Self-pay

## 2019-12-28 NOTE — Telephone Encounter (Signed)
Call placed to patient's mother regarding order for [facility].  She said that she purchased one yesterday at [facility] and is inquiring if his insurance company will reimburse her.  Instructed her that she will need to contact the ins company.   Also informed her that if she would like to return the [facility] to [place], she can pick one up free of charge from [employer] [facility] [place].  This CM spoke to Richard at [employer] and he said that they have many BSCs and one can be picked up today after 1400.  Provided her with the address/phone number  for [employer].  Also left the prescription at the [facility] front desk for her to pick up if she wishes.

## 2019-12-29 DIAGNOSIS — Z1211 Encounter for screening for malignant neoplasm of colon: Secondary | ICD-10-CM | POA: Diagnosis not present

## 2019-12-29 LAB — HM COLONOSCOPY

## 2020-01-02 ENCOUNTER — Ambulatory Visit (INDEPENDENT_AMBULATORY_CARE_PROVIDER_SITE_OTHER): Payer: Medicaid Other | Admitting: *Deleted

## 2020-01-02 DIAGNOSIS — I6340 Cerebral infarction due to embolism of unspecified cerebral artery: Secondary | ICD-10-CM

## 2020-01-02 NOTE — Telephone Encounter (Signed)
Refer to Erskine Squibb RN,CM notes in [place]

## 2020-01-03 LAB — CUP PACEART REMOTE DEVICE CHECK
Date Time Interrogation Session: 20210730230341
Implantable Pulse Generator Implant Date: 20201214

## 2020-01-04 NOTE — Progress Notes (Signed)
Carelink Summary Report / Loop Recorder 

## 2020-01-09 ENCOUNTER — Encounter: Payer: Self-pay | Admitting: Adult Health

## 2020-01-09 ENCOUNTER — Ambulatory Visit: Payer: Medicaid Other | Admitting: Adult Health

## 2020-01-09 ENCOUNTER — Other Ambulatory Visit: Payer: Self-pay | Admitting: Family Medicine

## 2020-01-09 VITALS — BP 116/73 | HR 73 | Ht 69.0 in | Wt 229.0 lb

## 2020-01-09 DIAGNOSIS — I639 Cerebral infarction, unspecified: Secondary | ICD-10-CM | POA: Diagnosis not present

## 2020-01-09 DIAGNOSIS — E785 Hyperlipidemia, unspecified: Secondary | ICD-10-CM | POA: Diagnosis not present

## 2020-01-09 DIAGNOSIS — I6523 Occlusion and stenosis of bilateral carotid arteries: Secondary | ICD-10-CM

## 2020-01-09 DIAGNOSIS — I10 Essential (primary) hypertension: Secondary | ICD-10-CM

## 2020-01-09 DIAGNOSIS — N182 Chronic kidney disease, stage 2 (mild): Secondary | ICD-10-CM

## 2020-01-09 NOTE — Progress Notes (Signed)
[name] [address] [place]. [place] [address] [phone]       STROKE FOLLOW UP NOTE  [name] Date of Birth:  May 10, 1956 Medical Record Number:  [id number]   Reason for Referral: stroke follow up    CHIEF COMPLAINT:  Chief Complaint  Patient presents with   Follow-up    6 month f/u, states he has been doing well since last visit   room 9    alone     HPI:  Today, 01/09/2020, Robert Donovan returns for stroke follow-up.   Residual deficits of mild RLE weakness, gait impairment and dysarthria.  Ongoing use of cane for ambulation and denies any recent falls.  Use of pedal machine at home with improvement of RLE weakness. Denies new or worsening stroke/TIA symptoms  Continues on aspirin and atorvastatin without side effects.  Blood pressure today 116/73.  Continues to follow with PCP for HTN and HLD management.  Loop recorder has not shown atrial fibrillation thus far.  No concerns at this time.    History provided for reference purposes only Update 07/11/2019: Robert Donovan is a 65 year old male who is being seen today for stroke follow-up.  Residual stroke deficits of mild RLE weakness and dysarthria but improving.  He has been more independent with daily activities independently and continues to due exercises daily.  He does continue to ambulate with assistive device as needed based on chronic bilateral knee pain where some days are better than others.  Denies recent falls.  Continues on aspirin 81 mg daily and atorvastatin 40 mg daily for secondary stroke prevention without side effects.  Blood pressure today 134/79.  At prior visit, referral to cardiology regarding recommended cardiac monitor with evaluation on 04/26/2019 and was referred to Dr. Royann Shivers for possible placement of loop recorder which was placed on 05/16/2019.  Loop recorder is not shown atrial fibrillation thus far.  He continues to question of prior syphilis diagnosis during  hospitalization and is questioning if he actually does have syphilis and if not, if diagnosis can be removed from his chart.  Denies new or worsening stroke/TIA symptoms.  Initial visit 03/30/2019: Robert Donovan is a 64 year old male who was referred to our office to follow-up regarding prior stroke because he did not have hospital discharge follow-up.  He has recently been discharged from nursing home and currently living in a apartment.  He does have supervision by a family friend and mother is in the process of acquiring medical aid to assist with medications, bathing and dressing.  He has difficulty with bathing and dressing independently due to residual stroke deficits.  Residual mild RLE weakness with great improvement from hospital discharge.  He continues to use rolling walker to assist with ambulation and denies any recent falls.  He also has residual dysarthria with speech hesitancy but denies cognitive or memory impairment.  His mother is concerned regarding his mood with frequent outbursts.  Patient states this is because his family is constantly telling him what he should and should not be doing and this frustrates him.  He denies depression or anxiety.  He has continued on aspirin and Plavix despite 3-week DAPT duration but denies bleeding or bruising.  Continues on atorvastatin without myalgias. Recent lipid panel obtained by PCP on 03/17/2019 showed LDL 82.  Also obtain A1c at 4.9.  Blood pressure today 144/89.  Patient and mother had multiple questions regarding diagnosis of prior syphilis infection during hospitalization and if syphilis causes stroke.  Neurosyphilis ruled out  during admission with CSF which was discussed with patient.  Recommended to undergo 30-day cardiac event monitor to rule out A. fib at discharge but this is not been completed.  No further concerns at this time.  Stroke admission 02/12/2018: Robert Donovan is a 64 y.o. male with history of previous stroke, hyperlipidemia,  remote syphilis, hypertension  and multiple embolic strokes on 02/12/2018.  He was referred to our office by his PCP as he did not have follow-up post hospital discharge.  He presented to ED with slurred speech, follows and dragging of left foot.  MRI head showed multiple areas of acute infarct in the left anterior cerebral artery and left middle cerebral artery territory suggesting emboli along with chronic bilateral pontine, left thalamic and right ACA punctate infarcts.  CTA head/neck showed 50% carotic siphon narrowing bilaterally along with distal basilar and left P1 moderate stenosis.  2D echo unremarkable.  Lower extremity venous Dopplers negative for DVT.  TEE negative for PFO or cardiac source of emboli.  LDL 76.  A1c 5.3.  Hypercoagulable and autoimmune work-up negative.  Recommended DAPT for 3 weeks then Plavix alone as previously on aspirin.  History of syphilis and gonorrhea with reports of penicillin treatment.  HTN stable.  Recommend continuation of atorvastatin 40 mg daily.  Other stroke risk factors include former tobacco use, EtOH use, obesity and prior stroke on imaging.  Recommended 30-day cardiac event monitor outpatient to rule out atrial fibrillation.  Residual deficits of right facial droop, mild right grip weakness and right leg monoplegia and discharged to SNF for ongoing therapy.     ROS:   14 system review of systems performed and negative with exception of weakness, gait impairment, speech impairment and joint pain  PMH:  Past Medical History:  Diagnosis Date   Arthritis    High cholesterol    Hypertension     PSH:  Past Surgical History:  Procedure Laterality Date   TEE WITHOUT CARDIOVERSION N/A 02/16/2018   Procedure: TRANSESOPHAGEAL ECHOCARDIOGRAM (TEE);  Surgeon: Jodelle Red, MD;  Location: [facility] ENDOSCOPY;  Service: Cardiovascular;  Laterality: N/A;    Social History:  Social History   Socioeconomic History   Marital status: Single    Spouse  name: Not on file   Number of children: Not on file   Years of education: Not on file   Highest education level: Not on file  Occupational History   Not on file  Tobacco Use   Smoking status: Former Smoker   Smokeless tobacco: Never Used  Building services engineer Use: Never used  Substance and Sexual Activity   Alcohol use: Not Currently   Drug use: Not Currently    Comment: hx marijuana use   Sexual activity: Not Currently  Other Topics Concern   Not on file  Social History Narrative   Not on file   Social Determinants of Health   Financial Resource Strain:    Difficulty of Paying Living Expenses:   Food Insecurity:    Worried About Programme researcher, broadcasting/film/video in the Last Year:    Barista in the Last Year:   Transportation Needs:    Freight forwarder (Medical):    Lack of Transportation (Non-Medical):   Physical Activity:    Days of Exercise per Week:    Minutes of Exercise per Session:   Stress:    Feeling of Stress :   Social Connections:    Frequency of Communication with Friends and Family:  Frequency of Social Gatherings with Friends and Family:    Attends Religious Services:    Active Member of Clubs or Organizations:    Attends Engineer, structuralClub or Organization Meetings:    Marital Status:   Intimate Partner Violence:    Fear of Current or Ex-Partner:    Emotionally Abused:    Physically Abused:    Sexually Abused:     Family History:  Family History  Problem Relation Age of Onset   Healthy Mother    Heart attack Father    Diabetes Brother     Medications:   Current Outpatient Medications on File Prior to Visit  Medication Sig Dispense Refill   aspirin EC 81 MG tablet Take 1 tablet (81 mg total) by mouth daily. 90 tablet 3   calcium-vitamin D (OSCAL WITH D) 500-200 MG-UNIT tablet Take 1 tablet by mouth. OTC     Multiple Vitamins-Minerals (MENS MULTIVITAMIN PLUS PO) Take by mouth. OTC     naproxen sodium (ALEVE) 220 MG  tablet Take 220 mg by mouth. OTC PRN     vitamin B-12 (CYANOCOBALAMIN) 500 MCG tablet Take 500 mcg by mouth daily. OTC     No current facility-administered medications on file prior to visit.    Allergies:   Allergies  Allergen Reactions   Hydrocodone     Pt stated, "I am not allergic, I do not want to take this medicine ever" Codeine - "I do not want to ever take this medicine - not allergic"   Oxycodone     Pt stated, "I am not allergic, I do not want to take this medicine ever"     Physical Exam  Vitals:   01/09/20 1341  BP: 116/73  Pulse: 73  Weight: 229 lb (103.9 kg)  Height: 5\' 9"  (1.753 m)   Body mass index is 33.82 kg/m. No exam data present  General: Very pleasant pleasant obese African American middle aged male, seated, in no evident distress Neck: supple with no carotid or supraclavicular bruits Cardiovascular: regular rate and rhythm, no murmurs Vascular:  Normal pulses all extremities   Neurologic Exam Mental Status: Awake and fully alert. Mild dysarthria with speech hesitancy. Oriented to place and time. Recent and remote memory intact. Attention span, concentration and fund of knowledge appropriate. Mood and affect appropriate.  Cranial Nerves: Pupils equal, briskly reactive to light. Extraocular movements full without nystagmus. Visual fields full to confrontation. Hearing intact. Facial sensation intact. Face, tongue, palate moves normally and symmetrically.  Motor: Normal bulk and tone. Normal strength in all tested extremity muscles except slight weakness right hip flexor.   Sensory.: intact to touch , pinprick , position and vibratory sensation.  Coordination: Rapid alternating movements normal in all extremities. Finger-to-nose performed accurately bilaterally and heel-to-shin difficulty performing due to bilateral knee pain. Gait and Station: Arises from chair without difficulty. Stance is normal. Abnormal gait with wide-based short slow steps and  increased tone with mild imbalance and use of cane Reflexes: 1+ and symmetric. Toes downgoing.       ASSESSMENT: Robert Donovan is a 64 y.o. year old male with PMH of prior stroke, HTN, HLD, morbid obesity, osteoarthritis and multifocal left-sided embolic strokes on 02/10/2018.  Placement of loop recorder for possible atrial fibrillation as stroke etiology.  He was referred to our office by his PCP as he did not follow-up in our office at hospital discharge due to residing in a nursing home setting since hospital discharge and recently discharged from nursing home currently living  independently (approximately 1 year later).     PLAN:  1. Multifocal embolic strokes:  a. Residual deficits: Dysarthria, gait impairment and mild RLE weakness.  Ongoing HEP and use of assistive device for fall prevention.   b. Continue aspirin 81 mg daily  and atorvastatin for secondary stroke prevention.   c. Close PCP follow-up for aggressive stroke risk factor management.   d. Continue to monitor loop recorder for possible atrial fibrillation. 2. HTN:  a. BP goal<130/90.  Stable today.   b. Managed by PCP 3. HLD:  a. LDL goal<70.   b. Continue atorvastatin and advised to reschedule follow-up visit with PCP for general medical follow-up as well as obtain lipid panel with other lab work 4. Hx of gonorrhea/syphilis:  a. Patient again questions accuracy of syphilis diagnosis which has been discussed at prior visits.  Again, advised to speak to PCP for possible repeat testing if indicated.    Overall stable from stroke standpoint recommend follow-up in as-needed basis   I spent 30 minutes of face-to-face and non-face-to-face time with patient.  This included previsit chart review, lab review, study review, order entry, electronic health record documentation, patient education regarding prior strokes, residual deficits, review of loop recorder, importance of managing stroke risk factors, questions regarding prior  diagnosis of syphilis and answered all questions to patient satisfaction   Ihor Austin, AGNP-BC  [facility] [address] [place], [place] [address]  Phone [phone] Fax [phone] Note: This document was prepared with digital dictation and possible smart phrase technology. Any transcriptional errors that result from this process are unintentional.

## 2020-01-09 NOTE — Patient Instructions (Signed)
Continue aspirin 81 mg daily  and atorvastatin for secondary stroke prevention with prescribing managed by PCP  Loop recorder will continue to be monitored for possible atrial fibrillation  Continue to follow up with PCP regarding cholesterol and blood pressure management -please reschedule follow-up visit for repeat lab work including cholesterol levels Maintain strict control of hypertension with blood pressure goal below 130/90 and cholesterol with LDL cholesterol (bad cholesterol) goal below 70 mg/dL.     Overall stable from stroke standpoint recommend follow-up as needed      Thank you for coming to see [place] at [facility]. I hope we have been able to provide you high quality care today.  You may receive a patient satisfaction survey over the next few weeks. We would appreciate your feedback and comments so that we may continue to improve ourselves and the health of our patients.

## 2020-01-09 NOTE — Progress Notes (Signed)
I agree with the above plan 

## 2020-02-02 ENCOUNTER — Other Ambulatory Visit: Payer: Self-pay

## 2020-02-02 ENCOUNTER — Ambulatory Visit (INDEPENDENT_AMBULATORY_CARE_PROVIDER_SITE_OTHER): Payer: Medicaid Other | Admitting: *Deleted

## 2020-02-02 ENCOUNTER — Encounter: Payer: Self-pay | Admitting: Physician Assistant

## 2020-02-02 ENCOUNTER — Ambulatory Visit: Payer: Medicaid Other | Attending: Physician Assistant | Admitting: Physician Assistant

## 2020-02-02 VITALS — BP 116/70 | HR 63 | Ht 69.0 in | Wt 267.2 lb

## 2020-02-02 DIAGNOSIS — N182 Chronic kidney disease, stage 2 (mild): Secondary | ICD-10-CM

## 2020-02-02 DIAGNOSIS — I6340 Cerebral infarction due to embolism of unspecified cerebral artery: Secondary | ICD-10-CM | POA: Diagnosis not present

## 2020-02-02 DIAGNOSIS — Z8619 Personal history of other infectious and parasitic diseases: Secondary | ICD-10-CM | POA: Diagnosis not present

## 2020-02-02 DIAGNOSIS — I10 Essential (primary) hypertension: Secondary | ICD-10-CM | POA: Diagnosis not present

## 2020-02-02 DIAGNOSIS — I6523 Occlusion and stenosis of bilateral carotid arteries: Secondary | ICD-10-CM

## 2020-02-02 DIAGNOSIS — I129 Hypertensive chronic kidney disease with stage 1 through stage 4 chronic kidney disease, or unspecified chronic kidney disease: Secondary | ICD-10-CM

## 2020-02-02 LAB — CUP PACEART REMOTE DEVICE CHECK
Date Time Interrogation Session: 20210901230540
Implantable Pulse Generator Implant Date: 20201214

## 2020-02-02 MED ORDER — AMLODIPINE BESYLATE 5 MG PO TABS: 5.0000 mg | ORAL_TABLET | Freq: Every day | ORAL | 5 refills | Status: DC

## 2020-02-02 MED ORDER — ASPIRIN EC 81 MG PO TBEC: 81.0000 mg | DELAYED_RELEASE_TABLET | Freq: Every day | ORAL | 3 refills | Status: DC

## 2020-02-02 MED ORDER — LISINOPRIL-HYDROCHLOROTHIAZIDE 20-25 MG PO TABS: ORAL_TABLET | ORAL | 4 refills | Status: DC

## 2020-02-02 MED ORDER — ATORVASTATIN CALCIUM 40 MG PO TABS: ORAL_TABLET | ORAL | 5 refills | Status: DC

## 2020-02-02 NOTE — Progress Notes (Signed)
Robert Donovan, is a 64 y.o. male  MVH:[id number]  WUX:[id number]  DOB - August 01, 1955  Subjective:  Chief Complaint and HPI: Robert Donovan is a 64 y.o. male here today with confusion as to why he is here.  He says he has a h/o positive syphilis but was never told he had syphilis.  He says he does not think he has ever been treated.  Patient is very difficult to obtain history from.  He denies any s/sx.  No rash.  No fevers.  Not sexually active.    ROS:   Constitutional:  No f/c, No night sweats, No unexplained weight loss. EENT:  No vision changes, No blurry vision, No hearing changes. No mouth, throat, or ear problems.  Respiratory: No cough, No SOB Cardiac: No CP, no palpitations GI:  No abd pain, No N/V/D. GU: No Urinary s/sx Musculoskeletal: No joint pain Neuro: No headache, no dizziness, no motor weakness.  Skin: No rash Endocrine:  No polydipsia. No polyuria.  Psych: Denies SI/HI  No problems updated.  ALLERGIES: Allergies  Allergen Reactions  . Hydrocodone     Pt stated, "I am not allergic, I do not want to take this medicine ever" Codeine - "I do not want to ever take this medicine - not allergic"  . Oxycodone     Pt stated, "I am not allergic, I do not want to take this medicine ever"    PAST MEDICAL HISTORY: Past Medical History:  Diagnosis Date  . Arthritis   . High cholesterol   . Hypertension     MEDICATIONS AT HOME: Prior to Admission medications   Medication Sig Start Date End Date Taking? Authorizing Provider  amLODipine (NORVASC) 5 MG tablet Take 1 tablet (5 mg total) by mouth daily. for blood pressure 02/02/20  Yes Julious Langlois, [place] Organ  aspirin EC 81 MG tablet Take 1 tablet (81 mg total) by mouth daily. 02/02/20  Yes Georgian Co M, PA-C  atorvastatin (LIPITOR) 40 MG tablet Take 1 in the evening 02/02/20  Yes Vianney Kopecky M, PA-C  calcium-vitamin D (OSCAL WITH D) 500-200 MG-UNIT tablet Take 1 tablet by mouth. OTC   Yes [provider]    lisinopril-hydrochlorothiazide (ZESTORETIC) 20-25 MG tablet TAKE 2 TABLETS BY MOUTH DAILY FOR BLOOD PRESSURE 02/02/20  Yes Lailani Tool M, PA-C  Multiple Vitamins-Minerals (MENS MULTIVITAMIN PLUS PO) Take by mouth. OTC   Yes [provider]  naproxen sodium (ALEVE) 220 MG tablet Take 220 mg by mouth. OTC PRN   Yes [provider]  vitamin B-12 (CYANOCOBALAMIN) 500 MCG tablet Take 500 mcg by mouth daily. OTC   Yes [provider]     Objective:  EXAM:   Vitals:   02/02/20 1451  BP: 116/70  Pulse: 63  SpO2: 98%  Weight: 267 lb 3.2 oz (121.2 kg)  Height: 5\' 9"  (1.753 m)    General appearance : A&OX3. NAD. Non-toxic-appearing HEENT: Atraumatic and Normocephalic.  PERRLA. Chest/Lungs:  Breathing-non-labored, Good air entry bilaterally, breath sounds normal without rales, rhonchi, or wheezing  CVS: S1 S2 regular, no murmurs, gallops, rubs  Extremities: Bilateral Lower Ext shows no edema, both legs are warm to touch with = pulse throughout Neurology:  CN II-XII grossly intact, Non focal.   Psych:  TP scattered. J/I poor. Normal speech. Appropriate eye contact and blunted affect.  Skin:  No Rash  Data Review Lab Results  Component Value Date   HGBA1C 4.9 03/17/2019   HGBA1C 5.3 02/11/2018   HGBA1C  5.0 12/07/2017     Assessment & Plan   1. 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 for blood pressure  Dispense: 30 tablet; Refill: 5 - lisinopril-hydrochlorothiazide (ZESTORETIC) 20-25 MG tablet; TAKE 2 TABLETS BY MOUTH DAILY FOR BLOOD PRESSURE  Dispense: 60 tablet; Refill: 4  2. Hypertension, unspecified type controlled - aspirin EC 81 MG tablet; Take 1 tablet (81 mg total) by mouth daily.  Dispense: 90 tablet; Refill: 3 - Comprehensive metabolic panel  3. Carotid atherosclerosis, bilateral - atorvastatin (LIPITOR) 40 MG tablet; Take 1 in the  evening  Dispense: 30 tablet; Refill: 5 - Lipid panel  4. H/O positive serological reaction for syphilis Unsure-will test and refer for any untreated ID.   - CBC with Differential/Platelet - Ambulatory referral to Infectious Disease - HEP, RPR, HIV Panel   Patient have been counseled extensively about nutrition and exercise  Return in about 2 months (around 04/03/2020) for PCP;  chronic conditions.  The patient was given clear instructions to go to ER or return to medical center if symptoms don't improve, worsen or new problems develop. The patient verbalized understanding. The patient was told to call to get lab results if they haven't heard anything in the next week.     Georgian Co, PA-C [facility] [place], [place] [phone]   02/02/2020, 4:08 PMPatient ID: Robert Donovan, male   DOB: 1955-07-10, 64 y.o.   MRN: [id number]

## 2020-02-03 LAB — CBC WITH DIFFERENTIAL/PLATELET
Basophils Absolute: 0.1 10*3/uL (ref 0.0–0.2)
Basos: 1 %
EOS (ABSOLUTE): 0.2 10*3/uL (ref 0.0–0.4)
Eos: 2 %
Hematocrit: 39.9 % (ref 37.5–51.0)
Hemoglobin: 12.9 g/dL — ABNORMAL LOW (ref 13.0–17.7)
Immature Grans (Abs): 0 10*3/uL (ref 0.0–0.1)
Immature Granulocytes: 0 %
Lymphocytes Absolute: 2.8 10*3/uL (ref 0.7–3.1)
Lymphs: 26 %
MCH: 28.7 pg (ref 26.6–33.0)
MCHC: 32.3 g/dL (ref 31.5–35.7)
MCV: 89 fL (ref 79–97)
Monocytes Absolute: 0.6 10*3/uL (ref 0.1–0.9)
Monocytes: 6 %
Neutrophils Absolute: 7.1 10*3/uL — ABNORMAL HIGH (ref 1.4–7.0)
Neutrophils: 65 %
Platelets: 222 10*3/uL (ref 150–450)
RBC: 4.49 x10E6/uL (ref 4.14–5.80)
RDW: 13.1 % (ref 11.6–15.4)
WBC: 10.8 10*3/uL (ref 3.4–10.8)

## 2020-02-03 LAB — COMPREHENSIVE METABOLIC PANEL
ALT: 8 IU/L (ref 0–44)
AST: 14 IU/L (ref 0–40)
Albumin/Globulin Ratio: 1.5 (ref 1.2–2.2)
Albumin: 4.3 g/dL (ref 3.8–4.8)
Alkaline Phosphatase: 97 IU/L (ref 48–121)
BUN/Creatinine Ratio: 17 (ref 10–24)
BUN: 20 mg/dL (ref 8–27)
Bilirubin Total: 1 mg/dL (ref 0.0–1.2)
CO2: 24 mmol/L (ref 20–29)
Calcium: 9.7 mg/dL (ref 8.6–10.2)
Chloride: 105 mmol/L (ref 96–106)
Creatinine, Ser: 1.21 mg/dL (ref 0.76–1.27)
GFR calc Af Amer: 74 mL/min/{1.73_m2} (ref >59)
GFR calc non Af Amer: 64 mL/min/{1.73_m2} (ref >59)
Globulin, Total: 2.9 g/dL (ref 1.5–4.5)
Glucose: 87 mg/dL (ref 65–99)
Potassium: 4.1 mmol/L (ref 3.5–5.2)
Sodium: 142 mmol/L (ref 134–144)
Total Protein: 7.2 g/dL (ref 6.0–8.5)

## 2020-02-03 LAB — LIPID PANEL
Chol/HDL Ratio: 3.3 ratio (ref 0.0–5.0)
Cholesterol, Total: 123 mg/dL (ref 100–199)
HDL: 37 mg/dL — ABNORMAL LOW (ref >39)
LDL Chol Calc (NIH): 73 mg/dL (ref 0–99)
Triglycerides: 57 mg/dL (ref 0–149)
VLDL Cholesterol Cal: 13 mg/dL (ref 5–40)

## 2020-02-03 LAB — HEP, RPR, HIV PANEL
HIV Screen 4th Generation wRfx: NONREACTIVE
Hepatitis B Surface Ag: NEGATIVE
RPR Ser Ql: REACTIVE — AB

## 2020-02-03 LAB — RPR, QUANT. (REFLEX): Rapid Plasma Reagin, Quant: 1:2 {titer} — ABNORMAL HIGH

## 2020-02-07 NOTE — Progress Notes (Signed)
Carelink Summary Report / Loop Recorder 

## 2020-02-28 ENCOUNTER — Encounter: Payer: Self-pay | Admitting: Internal Medicine

## 2020-02-28 ENCOUNTER — Ambulatory Visit (INDEPENDENT_AMBULATORY_CARE_PROVIDER_SITE_OTHER): Payer: Medicaid Other | Admitting: Internal Medicine

## 2020-02-28 ENCOUNTER — Other Ambulatory Visit (HOSPITAL_COMMUNITY)
Admission: RE | Admit: 2020-02-28 | Discharge: 2020-02-28 | Disposition: A | Discharge status: Home / Self Care | Payer: Medicaid Other | Source: Ambulatory Visit | Attending: Internal Medicine | Admitting: Internal Medicine

## 2020-02-28 ENCOUNTER — Other Ambulatory Visit: Payer: Self-pay

## 2020-02-28 VITALS — BP 128/81 | HR 58 | Temp 97.9°F | Ht 69.0 in | Wt 271.0 lb

## 2020-02-28 DIAGNOSIS — A539 Syphilis, unspecified: Secondary | ICD-10-CM

## 2020-02-28 DIAGNOSIS — E785 Hyperlipidemia, unspecified: Secondary | ICD-10-CM

## 2020-02-28 DIAGNOSIS — I10 Essential (primary) hypertension: Secondary | ICD-10-CM

## 2020-02-28 DIAGNOSIS — Z8673 Personal history of transient ischemic attack (TIA), and cerebral infarction without residual deficits: Secondary | ICD-10-CM

## 2020-02-28 MED ORDER — PENICILLIN G BENZATHINE 1200000 UNIT/2ML IM SUSP
1.2000 10*6.[IU] | Freq: Once | INTRAMUSCULAR | Status: AC
  Administered 2020-02-28: 1.2 10*6.[IU] via INTRAMUSCULAR

## 2020-02-28 NOTE — Progress Notes (Signed)
Cc: Questionable diagnosis of syphilis  HPI: Patient is referred here for questionable hx of syphilis in setting of hx cva & positive rpr 3 years ago.   Pending some dental work 11/10  Patient was last sexually active 10 years prior to this visit. Previously had different sexual partners unprotected.  He doesn't know if any of his previous partners had STD's He doesn't keep contact with any of his previous partner 3 years ago had a stroke and was told as part of workup that he might have hx of syphilis Patient doesn't remember he was ever diagnosed prior to that or treated for syphilis before that  No hx penile ulcer, skin rash, headache Patient lives with friends and his mother helps with grocery. He does his own banking.   Patient is weaker on the right leg. Patient is left handed. He walks with a cane because of the stroke   Std hx: Gonorrhea and pubic lice at age 64; had urethritis with gonorrhea; given penicillin and sx resolved. Doesn't remember if he was told he had it.       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 He hasn't been working since he had the stroke Lives with friend of his No smoking/drinking/drugs.   Fam Hx: No stroke in family. He doesn't know much about his family history  Physical exam: General: pleasant/obese/no distress HEENT: atraumatic; normocephalic; per; conj clear; eomi CV: rrr no mrg Lungs: cta abd obese; non tender; soft Ext no edema Neuro: cn2-12 intact; slight weakness rle 4-5/5; walks with cane Psych: alert/oriented Skin no rash msk no synovitis Lymph: no lymphadenopathy    Labs: 02/02/20 rpr 1:2; hiv nonreactive; hep b sAg negative 01/2018 rpr 1:2; tppa positive; hep b core igm & sAg negative; hep b and a ab negative 01/2018 csf 74 glucose, 235  rbc, 2 wbc, 80 protein 01/2018 csf culture negative 01/2018 csf vdrl non-reactive   Impression/plan: 48's yo male hx htn/hlp and stroke and previous distant gonorrhea, dx'ed with syphilis during stroke w/u 3 years prior to this visit  It is unclear when he was exposed. I believe the stroke could be unrelated. Slightly elevated protein level otherwise benigh csf analysis. rpr titer 1:2 from 2019-2020. Had pcn with gonorrhea at age 25; query syphilis dx then as well  Regardless discussion options with him and to be certain to fully treat, would do 3 weekly benzathrine pcn im   hiv negative this year  Check hep a/b/c full panel to see if vaccination needed for a and b. At risk for std   F/u weekly x2 with nurse for weekly abx F/u with me in a few weeks to discuss labs and need for hepatitis vaccines

## 2020-02-28 NOTE — Patient Instructions (Signed)
F/u in 4-6 weeks with dr Venancio Chenier   Labs today: Full hepatitis panel (including hep b sAg, core igg & Igm, sAb; hep a total ab, hep c ab)   Meds: benzathrine penicillin 2.4 million units im x3 weekly dose   Nurse visit weekly for another 2 weeks to finish benzathrine penicillin shot

## 2020-02-29 LAB — HEPATITIS A ANTIBODY, TOTAL: Hepatitis A AB,Total: NONREACTIVE

## 2020-02-29 LAB — HEPATITIS C ANTIBODY
Hepatitis C Ab: NONREACTIVE
SIGNAL TO CUT-OFF: 0.01

## 2020-02-29 LAB — URINE CYTOLOGY ANCILLARY ONLY
Chlamydia: NEGATIVE
Comment: NEGATIVE
Comment: NORMAL
Neisseria Gonorrhea: NEGATIVE

## 2020-02-29 LAB — HEPATITIS B CORE ANTIBODY, TOTAL: Hep B Core Total Ab: NONREACTIVE

## 2020-02-29 LAB — HEPATITIS B SURFACE ANTIGEN: Hepatitis B Surface Ag: NONREACTIVE

## 2020-02-29 LAB — HEPATITIS B SURFACE ANTIBODY,QUALITATIVE: Hep B S Ab: NONREACTIVE

## 2020-03-06 ENCOUNTER — Ambulatory Visit (INDEPENDENT_AMBULATORY_CARE_PROVIDER_SITE_OTHER): Payer: Medicaid Other

## 2020-03-06 ENCOUNTER — Other Ambulatory Visit: Payer: Self-pay

## 2020-03-06 DIAGNOSIS — I6340 Cerebral infarction due to embolism of unspecified cerebral artery: Secondary | ICD-10-CM

## 2020-03-06 DIAGNOSIS — A539 Syphilis, unspecified: Secondary | ICD-10-CM

## 2020-03-06 LAB — CUP PACEART REMOTE DEVICE CHECK
Date Time Interrogation Session: 20211004230154
Implantable Pulse Generator Implant Date: 20201214

## 2020-03-06 MED ORDER — PENICILLIN G BENZATHINE 1200000 UNIT/2ML IM SUSP
1.2000 10*6.[IU] | Freq: Once | INTRAMUSCULAR | Status: AC
  Administered 2020-03-06: 1.2 10*6.[IU] via INTRAMUSCULAR

## 2020-03-09 NOTE — Progress Notes (Signed)
Carelink Summary Report / Loop Recorder 

## 2020-03-13 ENCOUNTER — Ambulatory Visit (INDEPENDENT_AMBULATORY_CARE_PROVIDER_SITE_OTHER): Payer: Medicaid Other

## 2020-03-13 ENCOUNTER — Other Ambulatory Visit: Payer: Self-pay

## 2020-03-13 DIAGNOSIS — A539 Syphilis, unspecified: Secondary | ICD-10-CM

## 2020-03-13 MED ORDER — PENICILLIN G BENZATHINE 1200000 UNIT/2ML IM SUSP
1.2000 10*6.[IU] | Freq: Once | INTRAMUSCULAR | Status: AC
  Administered 2020-03-13: 1.2 10*6.[IU] via INTRAMUSCULAR

## 2020-03-20 ENCOUNTER — Encounter: Payer: Self-pay | Admitting: Podiatry

## 2020-03-20 ENCOUNTER — Ambulatory Visit (INDEPENDENT_AMBULATORY_CARE_PROVIDER_SITE_OTHER): Payer: Medicaid Other | Admitting: Podiatry

## 2020-03-20 ENCOUNTER — Other Ambulatory Visit: Payer: Self-pay

## 2020-03-20 DIAGNOSIS — M79674 Pain in right toe(s): Secondary | ICD-10-CM | POA: Diagnosis not present

## 2020-03-20 DIAGNOSIS — D689 Coagulation defect, unspecified: Secondary | ICD-10-CM

## 2020-03-20 DIAGNOSIS — N189 Chronic kidney disease, unspecified: Secondary | ICD-10-CM

## 2020-03-20 DIAGNOSIS — B351 Tinea unguium: Secondary | ICD-10-CM

## 2020-03-20 DIAGNOSIS — I739 Peripheral vascular disease, unspecified: Secondary | ICD-10-CM | POA: Diagnosis not present

## 2020-03-20 DIAGNOSIS — N179 Acute kidney failure, unspecified: Secondary | ICD-10-CM

## 2020-03-20 DIAGNOSIS — M79675 Pain in left toe(s): Secondary | ICD-10-CM

## 2020-03-20 NOTE — Progress Notes (Signed)
This patient returns to the office for at risk foot care.  This patient requires this care by a professional since this patient will be at risk due to having swelling, peripheral angiopathy acute kidney disease and symtomatic toenails.    These nails are painful walking and wearing shoes.  This patient presents for at risk foot care today.  General Appearance  Alert, conversant and in no acute stress.  Vascular  Dorsalis pedis palpable and posterior tibial  pulses are non palpable  bilaterally.  Capillary return is within normal limits  bilaterally. Temperature is within normal limits  Bilaterally. Bilateral swelling is present   Neurologic  Senn-Weinstein monofilament wire test within normal limits  bilaterally. Muscle power within normal limits bilaterally.  Nails Thick disfigured discolored nails with subungual debris  from hallux to fifth toes bilaterally. No evidence of bacterial infection or drainage bilaterally.   Orthopedic  No limitations of motion  feet .  No crepitus or effusions noted.  No bony pathology or digital deformities noted.    Skin  normotropic skin with no porokeratosis noted bilaterally.  No signs of infections or ulcers noted.    I  Assessment:  Onychomycosis  Pain in right toes  Pain in left toes, peripheral angiopathy with swelling bilateral.   Plan: Consent was obtained for treatment procedures.   Mechanical debridement of nails 1-5  bilaterally performed with a nail nipper.  Filed with dremel without incident.    Return office visit   3 months.    Told patient to return for periodic foot care and evaluation due to potential at risk complications.  Laelynn Blizzard DPM    

## 2020-04-03 ENCOUNTER — Ambulatory Visit: Payer: Medicaid Other | Admitting: Internal Medicine

## 2020-04-04 ENCOUNTER — Ambulatory Visit: Payer: Medicaid Other | Admitting: Family Medicine

## 2020-04-05 ENCOUNTER — Ambulatory Visit (INDEPENDENT_AMBULATORY_CARE_PROVIDER_SITE_OTHER): Payer: Medicaid Other | Admitting: Internal Medicine

## 2020-04-05 ENCOUNTER — Encounter: Payer: Self-pay | Admitting: Internal Medicine

## 2020-04-05 ENCOUNTER — Other Ambulatory Visit: Payer: Self-pay

## 2020-04-05 VITALS — BP 136/74 | HR 65 | Temp 98.0°F | Wt 269.0 lb

## 2020-04-05 DIAGNOSIS — A539 Syphilis, unspecified: Secondary | ICD-10-CM | POA: Diagnosis not present

## 2020-04-05 DIAGNOSIS — Z23 Encounter for immunization: Secondary | ICD-10-CM | POA: Diagnosis not present

## 2020-04-05 NOTE — Progress Notes (Signed)
[facility]  Patient Active Problem List   Diagnosis Date Noted  . Hyperlipidemia 04/26/2019  . Hypokalemia   . Acute embolic stroke (HCC)   . CVA (cerebral vascular accident) (HCC) 02/11/2018  . Acute kidney injury superimposed on chronic kidney disease (HCC) 02/11/2018  . History of syphilis 02/11/2018  . Constipation 02/11/2018  . Osteoarthritis 02/11/2018  . Benign essential HTN 02/11/2018  . Acute encephalopathy 02/10/2018    Patient's Medications  New Prescriptions   No medications on file  Previous Medications   AMLODIPINE (NORVASC) 5 MG TABLET    Take 1 tablet (5 mg total) by mouth daily. for blood pressure   ASPIRIN EC 81 MG TABLET    Take 1 tablet (81 mg total) by mouth daily.   ATORVASTATIN (LIPITOR) 40 MG TABLET    Take 1 in the evening   CALCIUM-VITAMIN D (OSCAL WITH D) 500-200 MG-UNIT TABLET    Take 1 tablet by mouth. OTC   LISINOPRIL-HYDROCHLOROTHIAZIDE (ZESTORETIC) 20-25 MG TABLET    TAKE 2 TABLETS BY MOUTH DAILY FOR BLOOD PRESSURE   MULTIPLE VITAMINS-MINERALS (MENS MULTIVITAMIN PLUS PO)    Take by mouth. OTC   NAPROXEN SODIUM (ALEVE) 220 MG TABLET    Take 220 mg by mouth. OTC PRN   VITAMIN B-12 (CYANOCOBALAMIN) 500 MCG TABLET    Take 500 mcg by mouth daily. OTC  Modified Medications   No medications on file  Discontinued Medications   No medications on file   HPI: HPI: Patient is referred here for questionable hx of syphilis in setting of hx cva & positive rpr 3 years ago.   Pending some dental work 11/10  Patient was last sexually active 10 years prior to this visit. Previously had different sexual partners unprotected.  He doesn't know if any of his previous partners had STD's He doesn't keep contact with any of his previous partner 3 years ago had a stroke and was told as part of workup that he might have hx of syphilis Patient doesn't remember he was ever diagnosed prior to that or treated for syphilis before  that  No hx penile ulcer, skin rash, headache Patient lives with friends and his mother helps with grocery. He does his own banking.   Patient is weaker on the right leg. Patient is left handed. He walks with a cane because of the stroke   Std hx: Gonorrhea and pubic lice at age 26; had urethritis with gonorrhea; given penicillin and sx resolved. Doesn't remember if he was told he had it.       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 He hasn't been working since he had the stroke Lives with friend of his No smoking/drinking/drugs.   Fam Hx: No stroke in family. 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  Prior covid vaccine moderna 2nd shot by 08/2019. Open to getting booster  Review of Systems: ROS Negative 11 point ros unless mentioned above   Past Medical History:  Diagnosis Date  . Arthritis   . High cholesterol   . Hypertension     Social History  Tobacco Use  . Smoking status: Former Games developer  . Smokeless tobacco: Never Used  Vaping Use  . Vaping Use: Never used  Substance Use Topics  . Alcohol use: Not Currently  . Drug use: Not Currently    Comment: hx marijuana use    Family History  Problem Relation Age of Onset  . Healthy Mother   . Heart attack Father   . Diabetes Brother     Allergies  Allergen Reactions  . Hydrocodone     Pt stated, "I am not allergic, I do not want to take this medicine ever" Codeine - "I do not want to ever take this medicine - not allergic"  . Oxycodone     Pt stated, "I am not allergic, I do not want to take this medicine ever"    Objective: Vitals:   04/05/20  1342  BP: 136/74  Pulse: 65  Temp: 98 F (36.7 C)  TempSrc: Oral  Weight: 269 lb (122 kg)   Body mass index is 39.72 kg/m.  Physical Exam General: pleasant/obese/no distress HEENT: atraumatic; normocephalic; per; conj clear; eomi CV: rrr no mrg Lungs: cta abd obese; non tender; soft Ext no edema Neuro: cn2-12 intact; slight weakness rle 4-5/5; walks with cane Psych: alert/oriented Skin no rash  Labs: Labs: 02/02/20 rpr 1:2; hiv nonreactive; hep b sAg negative 01/2018 rpr 1:2; tppa positive; hep b core igm & sAg negative; hep b and a ab negative 01/2018 csf 74 glucose, 235 rbc, 2 wbc, 80 protein 01/2018 csf culture negative 01/2018 csf vdrl non-reactive Micro:    Assessment/plan: 32's yo male hx htn/hlp and stroke and previous distant gonorrhea, dx'ed with syphilis during stroke w/u 3 years prior to this visit  It is unclear when he was exposed. I believe the stroke could be unrelated. Slightly elevated protein level otherwise benigh csf analysis. rpr titer 1:2 from 2019-2020. Had pcn with gonorrhea at age 53; query syphilis dx then as well  Regardless discussion options with him and to be certain to fully treat, would do 3 weekly benzathrine pcn im   hiv negative this year  ---- negative hep a/b/c full panel. He revealed no risk for blood born pathogen anymore and want to defer hep b vaccine  covid vaccine 08/2019 series finished (moderna). Open to get covid booster but will call when ready  Flu shot given today    -f/u 6 months for repeat rpr titer -call our clinic when you are ready to get covid vaccine -check with your regular doctor on other vaccinations    Problem List Items Addressed This Visit    None       Raymondo Band, MD [facility] -- -- pager   [phone] cell 04/05/2020, 1:54 PM

## 2020-04-05 NOTE — Patient Instructions (Signed)
It was a pleasure seeing you again today   You have been appropriately treated for syphilis.  We'll need to repeat a blood test for the syphilis level again in 6 months (end of spring 2022). Please see [place] at that time and we can order the test (or your primary care provider can also order).  Your number shouldn't go up on retesting   Also please discuss with your primary care to review immunizations record with you and administer any age appropriate vaccination  You received the influenza vaccine today 04/05/2020

## 2020-04-08 LAB — CUP PACEART REMOTE DEVICE CHECK
Date Time Interrogation Session: 20211106230113
Implantable Pulse Generator Implant Date: 20201214

## 2020-04-09 ENCOUNTER — Ambulatory Visit (INDEPENDENT_AMBULATORY_CARE_PROVIDER_SITE_OTHER): Payer: Medicaid Other

## 2020-04-09 DIAGNOSIS — I639 Cerebral infarction, unspecified: Secondary | ICD-10-CM

## 2020-04-09 NOTE — Progress Notes (Signed)
Carelink Summary Report / Loop Recorder 

## 2020-04-26 NOTE — Progress Notes (Deleted)
{Choose 1 Note Type (Telehealth Visit or Telephone Visit):[id number]} Virtual platform was offered given ongoing worsening Covid-19 pandemic.  Date:  04/26/2020   ID:  Robert Donovan, DOB 25-Mar-1956, MRN [id number]  Patient Location: Home Provider Location: [facility]  PCP:  Cain Saupe, MD  Cardiologist:  No primary care provider on file.  Electrophysiologist:  None   Evaluation Performed:  Follow-Up Visit  Chief Complaint: follow-up of stroke s/p loop recorder implantation and hypertension  History of Present Illness:    Robert Donovan is a 64 y.o. male with a history of stroke in 2019 with residual right sided hemiparesis***, hypertension, hyperlipidemia who is followed by Dr. Allyson Sabal and presents today for routine follow-up.  Patient was admitted in 01/2018 for acute multifocal embolic CVA in the left MCA and ACA territories. He had an extensive work-up including TEE with bubble study, hypercoagulable work-up, and evaluation of extracranial and intracranial arterial supply all of which did not show an etiology for stroke. He spent a year in a nursing facility after the stroke recovering. Patient was referred to Dr. Allyson Sabal in 04/2019 for further work-up for evaluation of prior stroke. Patient was then referred to Dr. Royann Shivers for loop recorder implantation to rule out arrhythmogenic cause of stroke. Loop recorder was placed on 05/16/2019 with no evidence of atrial fibrillation/flutter so far.  Patient presents today for routine follow-up. ***  CVA History of multifocal stroke in 01/2018. Extensive work-up including TEE with bubble study, hypercoagulable work-up, and evaluation of extracranial and intracranial arterial supply all of which did not show an etiology for stroke. Underwent loop recorder placement in 05/2019 to rule out arrhythmogenic cause with no evidence of atrial fibrillation/flutter to date. Continue DAPT per Neurology. Continue statin.  Hypertension ***. Continue  Lisinopril-HCTZ ***.  Hyperlipidemia Most recent lipid panel in 02/2020: Total Cholesterol 123, Triglycerides 57, HDL 37, LDL 73. Continue Lipitor 40mg  daily. Labs followed by PCP.    The patient {does/does not:200015} have symptoms concerning for COVID-19 infection (fever, chills, cough, or new shortness of breath).    Past Medical History:  Diagnosis Date  . Arthritis   . High cholesterol   . Hypertension    Past Surgical History:  Procedure Laterality Date  . TEE WITHOUT CARDIOVERSION N/A 02/16/2018   Procedure: TRANSESOPHAGEAL ECHOCARDIOGRAM (TEE);  Surgeon: 02/18/2018, MD;  Location: [facility] ENDOSCOPY;  Service: Cardiovascular;  Laterality: N/A;     No outpatient medications have been marked as taking for the 05/01/20 encounter (Appointment) with 05/03/20, PA-C.     Allergies:   Hydrocodone and Oxycodone   Social History   Tobacco Use  . Smoking status: Former Corrin Parker  . Smokeless tobacco: Never Used  Vaping Use  . Vaping Use: Never used  Substance Use Topics  . Alcohol use: Not Currently  . Drug use: Not Currently    Comment: hx marijuana use     Family Hx: The patient's family history includes Diabetes in his brother; Healthy in his mother; Heart attack in his father.  ROS:   Please see the history of present illness.    All other systems reviewed and are negative.   Prior CV studies:    The following studies were reviewed today:   TEE 02/16/2018: Study Conclusions: - Left ventricle: Systolic function was normal. The estimated  ejection fraction was in the range of 60% to 65%. Wall motion was  normal; there were no regional wall motion abnormalities.  - Left atrium: No evidence of thrombus in the atrial cavity  or  appendage.  - Right atrium: No evidence of thrombus in the atrial cavity or  appendage.  - Atrial septum: No defect or patent foramen ovale was identified.  Echo contrast study showed no right-to-left atrial level  shunt.   Impressions:  - Normal study. No cardiac source of emboli was indentified.  _______________  Carotid Ultrasounds 06/06/2019: Summary:  - Right Carotid: Velocities in the right ICA are consistent with a 1-39% stenosis.  - Left Carotid: Velocities in the left ICA are consistent with a 1-39% stenosis. The ECA appears >50% stenosed.  - Vertebrals: Right vertebral artery demonstrates antegrade flow. Left vertebral artery demonstrates no discernable flow.  - Subclavians: Normal flow hemodynamics were seen in bilateral subclavian arteries.  Labs/Other Tests and Data Reviewed:    EKG:  {EKG/Telemetry Strips Reviewed:[phone]}  Recent Labs: 02/02/2020: ALT 8; BUN 20; Creatinine, Ser 1.21; Hemoglobin 12.9; Platelets 222; Potassium 4.1; Sodium 142   Recent Lipid Panel Lab Results  Component Value Date/Time   CHOL 123 02/02/2020 03:35 PM   TRIG 57 02/02/2020 03:35 PM   HDL 37 (L) 02/02/2020 03:35 PM   CHOLHDL 3.3 02/02/2020 03:35 PM   CHOLHDL 4.3 02/11/2018 03:45 AM   LDLCALC 73 02/02/2020 03:35 PM    Wt Readings from Last 3 Encounters:  04/05/20 269 lb (122 kg)  02/28/20 271 lb (122.9 kg)  02/02/20 267 lb 3.2 oz (121.2 kg)     Objective:    Vital Signs:  There were no vitals taken for this visit.   Vital Signs Reviewed. General: No acute distress. Pulm: No labored breathing. No coughing during visit. No audible wheezing. Speaking in full sentences. Neuro: Alert and oriented. No slurred speech. Answers questions appropriately. Psych: Pleasant affect.  ASSESSMENT & PLAN:      COVID-19 Education: The signs and symptoms of COVID-19 were discussed with the patient and how to seek care for testing (follow up with PCP or arrange E-visit).  The importance of social distancing was discussed today.  Time:   Today, I have spent *** minutes with the patient with telehealth technology discussing the above problems.     Medication Adjustments/Labs and Tests Ordered: Current  medicines are reviewed at length with the patient today.  Concerns regarding medicines are outlined above.   Follow Up:  {F/U Format:[phone]} {follow up:15908}  Signed, Corrin Parker, PA-C  04/26/2020 11:08 AM    [facility]

## 2020-04-30 ENCOUNTER — Other Ambulatory Visit: Payer: Self-pay

## 2020-04-30 ENCOUNTER — Encounter: Payer: Self-pay | Admitting: Internal Medicine

## 2020-04-30 ENCOUNTER — Ambulatory Visit: Payer: Medicaid Other | Attending: Physician Assistant | Admitting: Internal Medicine

## 2020-04-30 VITALS — BP 130/70 | HR 66 | Resp 16 | Wt 269.4 lb

## 2020-04-30 DIAGNOSIS — I10 Essential (primary) hypertension: Secondary | ICD-10-CM | POA: Diagnosis not present

## 2020-04-30 DIAGNOSIS — E782 Mixed hyperlipidemia: Secondary | ICD-10-CM | POA: Diagnosis not present

## 2020-04-30 DIAGNOSIS — Z125 Encounter for screening for malignant neoplasm of prostate: Secondary | ICD-10-CM

## 2020-04-30 DIAGNOSIS — E669 Obesity, unspecified: Secondary | ICD-10-CM | POA: Diagnosis not present

## 2020-04-30 DIAGNOSIS — G8191 Hemiplegia, unspecified affecting right dominant side: Secondary | ICD-10-CM | POA: Diagnosis not present

## 2020-04-30 DIAGNOSIS — I6930 Unspecified sequelae of cerebral infarction: Secondary | ICD-10-CM | POA: Diagnosis not present

## 2020-04-30 NOTE — Progress Notes (Signed)
Patient ID: Robert Donovan, male    DOB: [birth year]  MRN: [id number]  CC: Hypertension and re-establish   Subjective: Robert Donovan is a 64 y.o. male who presents for chronic ds management.  Previous PCP was Dr. Jillyn Hidden who is no longer with this practice. His concerns today include:  Pt with hx of HTN, HL, multifocal LT sided embolic strokes (RT sided weakness lower>upper) loop recorder in place, PVD,  history of syphilis, knee arthritis     Saw ID Dr. Renold Don recently for ?hx of syphilis.  Treated with PCN Q wk x 3 wks.  Plan for f/u in 6 mths to recheck titer.  HYPERTENSION Currently taking: see medication list.  Very compliant with meds that includes lisinopril/hydrochlorothiazide and amlodipine Med Adherence: [x]  Yes    []  No Medication side effects: [x]  Yes    []  No Adherence with salt restriction: []  Yes    [x]  No Home Monitoring?: [x]  Yes    []  No Monitoring Frequency:  daily Home BP results range:  yesterday was 122/65 SOB? []  Yes    [x]  No Chest Pain?: []  Yes    [x]  No Leg swelling?: []  Yes    [x]  No Headaches?: []  Yes    [x]  No Dizziness? []  Yes    [x]  No Comments:   HL tolerating atorvastatin.  CVA:  Ambulates with cane.  No falls.  Lives in an apartment.  Does his own cooking and grocery shopping. Does his own cleaning  Obesity: Working on trying to get his weight down.  Reports he was 341 lbs 3 yrs ago Uses his stationary bike cycle every other day. Drinks grape juice.  He has started eating wheat bread.  Does not eat too much red meat.  He would like to have his prostate checked for screening.  Patient Active Problem List   Diagnosis Date Noted  . Right hemiparesis (HCC) 04/30/2020  . Hyperlipidemia 04/26/2019  . Hypokalemia   . Acute embolic stroke (HCC)   . CVA (cerebral vascular accident) (HCC) 02/11/2018  . Acute kidney injury superimposed on chronic kidney disease (HCC) 02/11/2018  . History of syphilis 02/11/2018  . Constipation 02/11/2018  .  Osteoarthritis 02/11/2018  . Benign essential HTN 02/11/2018  . Acute encephalopathy 02/10/2018     Current Outpatient Medications on File Prior to Visit  Medication Sig Dispense Refill  . amLODipine (NORVASC) 5 MG tablet Take 1 tablet (5 mg total) by mouth daily. for blood pressure 30 tablet 5  . aspirin EC 81 MG tablet Take 1 tablet (81 mg total) by mouth daily. 90 tablet 3  . atorvastatin (LIPITOR) 40 MG tablet Take 1 in the evening 30 tablet 5  . calcium-vitamin D (OSCAL WITH D) 500-200 MG-UNIT tablet Take 1 tablet by mouth. OTC    . lisinopril-hydrochlorothiazide (ZESTORETIC) 20-25 MG tablet TAKE 2 TABLETS BY MOUTH DAILY FOR BLOOD PRESSURE 60 tablet 4  . Multiple Vitamins-Minerals (MENS MULTIVITAMIN PLUS PO) Take by mouth. OTC    . naproxen sodium (ALEVE) 220 MG tablet Take 220 mg by mouth. OTC PRN    . vitamin B-12 (CYANOCOBALAMIN) 500 MCG tablet Take 500 mcg by mouth daily. OTC     No current facility-administered medications on file prior to visit.    Allergies  Allergen Reactions  . Hydrocodone     Pt stated, "I am not allergic, I do not want to take this medicine ever" Codeine - "I do not want to ever take this medicine - not allergic"  .  Oxycodone     Pt stated, "I am not allergic, I do not want to take this medicine ever"    Social History   Socioeconomic History  . Marital status: Single    Spouse name: Not on file  . Number of children: Not on file  . Years of education: Not on file  . Highest education level: Not on file  Occupational History  . Not on file  Tobacco Use  . Smoking status: Former Games developer  . Smokeless tobacco: Never Used  Vaping Use  . Vaping Use: Never used  Substance and Sexual Activity  . Alcohol use: Not Currently  . Drug use: Not Currently    Comment: hx marijuana use  . Sexual activity: Not Currently    Partners: Female    Birth control/protection: None    Comment: last encounter 2011  Other Topics Concern  . Not on file  Social  History Narrative  . Not on file   Social Determinants of Health   Financial Resource Strain:   . Difficulty of Paying Living Expenses: Not on file  Food Insecurity:   . Worried About Programme researcher, broadcasting/film/video in the Last Year: Not on file  . Ran Out of Food in the Last Year: Not on file  Transportation Needs:   . Lack of Transportation (Medical): Not on file  . Lack of Transportation (Non-Medical): Not on file  Physical Activity:   . Days of Exercise per Week: Not on file  . Minutes of Exercise per Session: Not on file  Stress:   . Feeling of Stress : Not on file  Social Connections:   . Frequency of Communication with Friends and Family: Not on file  . Frequency of Social Gatherings with Friends and Family: Not on file  . Attends Religious Services: Not on file  . Active Member of Clubs or Organizations: Not on file  . Attends Banker Meetings: Not on file  . Marital Status: Not on file  Intimate Partner Violence:   . Fear of Current or Ex-Partner: Not on file  . Emotionally Abused: Not on file  . Physically Abused: Not on file  . Sexually Abused: Not on file    Family History  Problem Relation Age of Onset  . Healthy Mother   . Heart attack Father   . Diabetes Brother     Past Surgical History:  Procedure Laterality Date  . TEE WITHOUT CARDIOVERSION N/A 02/16/2018   Procedure: TRANSESOPHAGEAL ECHOCARDIOGRAM (TEE);  Surgeon: Jodelle Red, MD;  Location: [facility] ENDOSCOPY;  Service: Cardiovascular;  Laterality: N/A;    ROS: Review of Systems Negative except as stated above  PHYSICAL EXAM: BP 130/70   Pulse 66   Resp 16   Wt 269 lb 6.4 oz (122.2 kg)   SpO2 95%   BMI 39.78 kg/m   Wt Readings from Last 3 Encounters:  04/30/20 269 lb 6.4 oz (122.2 kg)  04/05/20 269 lb (122 kg)  02/28/20 271 lb (122.9 kg)    Physical Exam  General appearance - alert, well appearing, obese older African-American male and in no distress Mental status - normal  mood, behavior, speech, dress, motor activity, and thought processes Neck - supple, no significant adenopathy Chest - clear to auscultation, no wheezes, rales or rhonchi, symmetric air entry Heart - normal rate, regular rhythm, normal S1, S2, no murmurs, rubs, clicks or gallops Extremities - peripheral pulses normal, no pedal edema, no clubbing or cyanosis Neuro: Grip 5/5 bilaterally.  Power  upper extremities 5/5 on the left, 4+/5 right.  Power lower extremities 4/5 right, 5/5 left.  Low foot to floor clearance with his gait.  He ambulates with a cane. MSK: Mild bow legs at knee jts. CMP Latest Ref Rng & Units 02/02/2020 08/05/2019 03/17/2019  Glucose 65 - 99 mg/dL 87 86 91  BUN 8 - 27 mg/dL 20 17 16   Creatinine 0.76 - 1.27 mg/dL 1.61 0.96  Sodium 134 - 144 mmol/L 142 139 142  Potassium 3.5 - 5.2 mmol/L 4.1 4.1 4.5  Chloride 96 - 106 mmol/L 105 103 106  CO2 20 - 29 mmol/L 24 23 22   Calcium 8.6 - 10.2 mg/dL 9.7 0.45 9.6  Total Protein 6.0 - 8.5 g/dL 7.2 7.1 6.9  Total Bilirubin 0.0 - 1.2 mg/dL 1.0 1.1 1.0  Alkaline Phos 48 - 121 IU/L 97 115 89  AST 0 - 40 IU/L 14 13 12   ALT 0 - 44 IU/L 8 9 7    Lipid Panel     Component Value Date/Time   CHOL 123 02/02/2020 1535   TRIG 57 02/02/2020 1535   HDL 37 (L) 02/02/2020 1535   CHOLHDL 3.3 02/02/2020 1535   CHOLHDL 4.3 02/11/2018 0345   VLDL 16 02/11/2018 0345   LDLCALC 73 02/02/2020 1535    CBC    Component Value Date/Time   WBC 10.8 02/02/2020 1535   WBC 10.4 02/11/2018 0345   RBC 4.49 02/02/2020 1535   RBC 5.06 02/11/2018 0345   HGB 12.9 (L) 02/02/2020 1535   HCT 39.9 02/02/2020 1535   PLT 222 02/02/2020 1535   MCV 89 02/02/2020 1535   MCH 28.7 02/02/2020 1535   MCH 28.5 02/11/2018 0345   MCHC 32.3 02/02/2020 1535   MCHC 32.4 02/11/2018 0345   RDW 13.1 02/02/2020 1535   LYMPHSABS 2.8 02/02/2020 1535   MONOABS 0.7 02/11/2018 0345   EOSABS 0.2 02/02/2020 1535   BASOSABS 0.1 02/02/2020 1535    ASSESSMENT AND PLAN:  1.  Essential hypertension At goal.  Continue amlodipine, lisinopril/HCTZ and low-salt diet  2. History of CVA with residual deficit 3. Right hemiparesis (HCC) Stressed importance of good blood pressure control.  Continue atorvastatin and aspirin  4. Obesity (BMI 35.0-39.9 without comorbidity) Dietary counseling given.  Printed information also given.  Commended him on exercising and encouraged him to continue to do so as tolerated.  5. Prostate cancer screening Discussed prostate cancer screening with a PSA level.  Patient agreeable to screening - PSA  6. Mixed hyperlipidemia Continue atorvastatin    Patient was given the opportunity to ask questions.  Patient verbalized understanding of the plan and was able to repeat key elements of the plan.   Orders Placed This Encounter  Procedures  . PSA     Requested Prescriptions    No prescriptions requested or ordered in this encounter    Return in about 4 months (around 08/28/2020).  04/03/2020, MD, FACP

## 2020-04-30 NOTE — Patient Instructions (Signed)
Healthy Eating Following a healthy eating pattern may help you to achieve and maintain a healthy body weight, reduce the risk of chronic disease, and live a long and productive life. It is important to follow a healthy eating pattern at an appropriate calorie level for your body. Your nutritional needs should be met primarily through food by choosing a variety of nutrient-rich foods. What are tips for following this plan? Reading food labels  Read labels and choose the following: ? Reduced or low sodium. ? Juices with 100% fruit juice. ? Foods with low saturated fats and high polyunsaturated and monounsaturated fats. ? Foods with whole grains, such as whole wheat, cracked wheat, brown rice, and wild rice. ? Whole grains that are fortified with folic acid. This is recommended for women who are pregnant or who want to become pregnant.  Read labels and avoid the following: ? Foods with a lot of added sugars. These include foods that contain brown sugar, corn sweetener, corn syrup, dextrose, fructose, glucose, high-fructose corn syrup, honey, invert sugar, lactose, malt syrup, maltose, molasses, raw sugar, sucrose, trehalose, or turbinado sugar.  Do not eat more than the following amounts of added sugar per day:  6 teaspoons (25 g) for women.  9 teaspoons (38 g) for men. ? Foods that contain processed or refined starches and grains. ? Refined grain products, such as white flour, degermed cornmeal, white bread, and white rice. Shopping  Choose nutrient-rich snacks, such as vegetables, whole fruits, and nuts. Avoid high-calorie and high-sugar snacks, such as potato chips, fruit snacks, and candy.  Use oil-based dressings and spreads on foods instead of solid fats such as butter, stick margarine, or cream cheese.  Limit pre-made sauces, mixes, and "instant" products such as flavored rice, instant noodles, and ready-made pasta.  Try more plant-protein sources, such as tofu, tempeh, black beans,  edamame, lentils, nuts, and seeds.  Explore eating plans such as the Mediterranean diet or vegetarian diet. Cooking  Use oil to saut or stir-fry foods instead of solid fats such as butter, stick margarine, or lard.  Try baking, boiling, grilling, or broiling instead of frying.  Remove the fatty part of meats before cooking.  Steam vegetables in water or broth. Meal planning   At meals, imagine dividing your plate into fourths: ? One-half of your plate is fruits and vegetables. ? One-fourth of your plate is whole grains. ? One-fourth of your plate is protein, especially lean meats, poultry, eggs, tofu, beans, or nuts.  Include low-fat dairy as part of your daily diet. Lifestyle  Choose healthy options in all settings, including home, work, school, restaurants, or stores.  Prepare your food safely: ? Wash your hands after handling raw meats. ? Keep food preparation surfaces clean by regularly washing with hot, soapy water. ? Keep raw meats separate from ready-to-eat foods, such as fruits and vegetables. ? Cook seafood, meat, poultry, and eggs to the recommended internal temperature. ? Store foods at safe temperatures. In general:  Keep cold foods at 59F (4.4C) or below.  Keep hot foods at 159F (60C) or above.  Keep your freezer at [facility] (-17.8C) or below.  Foods are no longer safe to eat when they have been between the temperatures of 40-159F (4.4-60C) for more than 2 hours. What foods should I eat? Fruits Aim to eat 2 cup-equivalents of fresh, canned (in natural juice), or frozen fruits each day. Examples of 1 cup-equivalent of fruit include 1 small apple, 8 large strawberries, 1 cup canned fruit,  cup  dried fruit, or 1 cup 100% juice. Vegetables Aim to eat 2-3 cup-equivalents of fresh and frozen vegetables each day, including different varieties and colors. Examples of 1 cup-equivalent of vegetables include 2 medium carrots, 2 cups raw, leafy greens, 1 cup chopped  vegetable (raw or cooked), or 1 medium baked potato. Grains Aim to eat 6 ounce-equivalents of whole grains each day. Examples of 1 ounce-equivalent of grains include 1 slice of bread, 1 cup ready-to-eat cereal, 3 cups popcorn, or  cup cooked rice, pasta, or cereal. Meats and other proteins Aim to eat 5-6 ounce-equivalents of protein each day. Examples of 1 ounce-equivalent of protein include 1 egg, 1/2 cup nuts or seeds, or 1 tablespoon (16 g) peanut butter. A cut of meat or fish that is the size of a deck of cards is about 3-4 ounce-equivalents.  Of the protein you eat each week, try to have at least 8 ounces come from seafood. This includes salmon, trout, herring, and anchovies. Dairy Aim to eat 3 cup-equivalents of fat-free or low-fat dairy each day. Examples of 1 cup-equivalent of dairy include 1 cup (240 mL) milk, 8 ounces (250 g) yogurt, 1 ounces (44 g) natural cheese, or 1 cup (240 mL) fortified soy milk. Fats and oils  Aim for about 5 teaspoons (21 g) per day. Choose monounsaturated fats, such as canola and olive oils, avocados, peanut butter, and most nuts, or polyunsaturated fats, such as sunflower, corn, and soybean oils, walnuts, pine nuts, sesame seeds, sunflower seeds, and flaxseed. Beverages  Aim for six 8-oz glasses of water per day. Limit coffee to three to five 8-oz cups per day.  Limit caffeinated beverages that have added calories, such as soda and energy drinks.  Limit alcohol intake to no more than 1 drink a day for nonpregnant women and 2 drinks a day for men. One drink equals 12 oz of beer (355 mL), 5 oz of wine (148 mL), or 1 oz of hard liquor (44 mL). Seasoning and other foods  Avoid adding excess amounts of salt to your foods. Try flavoring foods with herbs and spices instead of salt.  Avoid adding sugar to foods.  Try using oil-based dressings, sauces, and spreads instead of solid fats. This information is based on general [place] nutrition guidelines. For more  information, visit [employer]es. Exact amounts may vary based on your nutrition needs. Summary  A healthy eating plan may help you to maintain a healthy weight, reduce the risk of chronic diseases, and stay active throughout your life.  Plan your meals. Make sure you eat the right portions of a variety of nutrient-rich foods.  Try baking, boiling, grilling, or broiling instead of frying.  Choose healthy options in all settings, including home, work, school, restaurants, or stores. This information is not intended to replace advice given to you by your health care provider. Make sure you discuss any questions you have with your health care provider. Document Revised: 08/31/2017 Document Reviewed: 08/31/2017 Elsevier Patient Education  [place].

## 2020-05-01 ENCOUNTER — Telehealth: Payer: Medicaid Other | Admitting: Student

## 2020-05-01 LAB — PSA: Prostate Specific Ag, Serum: 0.6 ng/mL (ref 0.0–4.0)

## 2020-05-02 ENCOUNTER — Encounter: Payer: Self-pay | Admitting: Cardiology

## 2020-05-02 ENCOUNTER — Telehealth (INDEPENDENT_AMBULATORY_CARE_PROVIDER_SITE_OTHER): Payer: Medicaid Other | Admitting: Cardiology

## 2020-05-02 VITALS — BP 127/67 | HR 66 | Ht 69.0 in | Wt 259.0 lb

## 2020-05-02 DIAGNOSIS — I6529 Occlusion and stenosis of unspecified carotid artery: Secondary | ICD-10-CM

## 2020-05-02 DIAGNOSIS — E782 Mixed hyperlipidemia: Secondary | ICD-10-CM | POA: Diagnosis not present

## 2020-05-02 DIAGNOSIS — Z8673 Personal history of transient ischemic attack (TIA), and cerebral infarction without residual deficits: Secondary | ICD-10-CM | POA: Diagnosis not present

## 2020-05-02 DIAGNOSIS — Z9889 Other specified postprocedural states: Secondary | ICD-10-CM

## 2020-05-02 DIAGNOSIS — I10 Essential (primary) hypertension: Secondary | ICD-10-CM | POA: Diagnosis not present

## 2020-05-02 DIAGNOSIS — I779 Disorder of arteries and arterioles, unspecified: Secondary | ICD-10-CM | POA: Insufficient documentation

## 2020-05-02 NOTE — Progress Notes (Signed)
Virtual Visit via Telephone Note   This visit type was conducted due to national recommendations for restrictions regarding the COVID-19 Pandemic (e.g. social distancing) in an effort to limit this patient's exposure and mitigate transmission in our community.  Due to his co-morbid illnesses, this patient is at least at moderate risk for complications without adequate follow up.  This format is felt to be most appropriate for this patient at this time.  The patient did not have access to video technology/had technical difficulties with video requiring transitioning to audio format only (telephone).  All issues noted in this document were discussed and addressed.  No physical exam could be performed with this format.  Please refer to the patient's chart for his  consent to telehealth for [facility].    Date:  05/02/2020   ID:  Robert Donovan, DOB [birth year], MRN [id number] The patient was identified using 2 identifiers.  Patient Location: Home Provider Location: Home Office  PCP:  [name], MD  Cardiologist:  Dr Allyson Sabal Electrophysiologist:  None   Evaluation Performed:  Follow-Up Visit  Chief Complaint:  none  History of Present Illness:    Robert Donovan is a 64 y.o. 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  Work-up including TEE, hypercoagulable evaluation, and carotid evaluation revealed no obvious source.  A loop recorder was implanted in December 2020 and has yet to reveal any arrhythmias.  The patient was contacted today for routine follow-up.  He says he has been progressing well.  He uses an exercise bike and arm weights.  His blood pressure at home has been controlled.  He has had no issues with his medications.  The patient does not have symptoms concerning for COVID-19 infection (fever, chills, cough, or new shortness of breath).    Past Medical History:  Diagnosis Date  . Arthritis   . High cholesterol   . Hypertension    Past  Surgical History:  Procedure Laterality Date  . TEE WITHOUT CARDIOVERSION N/A 02/16/2018   Procedure: TRANSESOPHAGEAL ECHOCARDIOGRAM (TEE);  Surgeon: Jodelle Red, MD;  Location: [facility] ENDOSCOPY;  Service: Cardiovascular;  Laterality: N/A;     Current Meds  Medication Sig  . amLODipine (NORVASC) 5 MG tablet Take 1 tablet (5 mg total) by mouth daily. for blood pressure  . aspirin EC 81 MG tablet Take 1 tablet (81 mg total) by mouth daily.  Marland Kitchen atorvastatin (LIPITOR) 40 MG tablet Take 1 in the evening  . calcium-vitamin D (OSCAL WITH D) 500-200 MG-UNIT tablet Take 1 tablet by mouth. OTC  . lisinopril-hydrochlorothiazide (ZESTORETIC) 20-25 MG tablet TAKE 2 TABLETS BY MOUTH DAILY FOR BLOOD PRESSURE  . Multiple Vitamins-Minerals (MENS MULTIVITAMIN PLUS PO) Take by mouth. OTC  . naproxen sodium (ALEVE) 220 MG tablet Take 220 mg by mouth. OTC PRN  . vitamin B-12 (CYANOCOBALAMIN) 500 MCG tablet Take 500 mcg by mouth daily. OTC     Allergies:   Hydrocodone and Oxycodone   Social History   Tobacco Use  . Smoking status: Former Games developer  . Smokeless tobacco: Never Used  Vaping Use  . Vaping Use: Never used  Substance Use Topics  . Alcohol use: Not Currently  . Drug use: Not Currently    Comment: hx marijuana use     Family Hx: The patient's family history includes Diabetes in his brother; Healthy in his mother; Heart attack in his father.  ROS:   Please see the history of present illness.   All other systems  reviewed and are negative.   Prior CV studies:   The following studies were reviewed today:  TEE Sept 2019- Study Conclusions   - Left ventricle: Systolic function was normal. The estimated  ejection fraction was in the range of 60% to 65%. Wall motion was  normal; there were no regional wall motion abnormalities.  - Left atrium: No evidence of thrombus in the atrial cavity or  appendage.  - Right atrium: No evidence of thrombus in the atrial cavity or   appendage.  - Atrial septum: No defect or patent foramen ovale was identified.  Echo contrast study showed no right-to-left atrial level shunt.   Impressions:   - Normal study. No cardiac source of emboli was indentified.   Labs/Other Tests and Data Reviewed:    EKG:  An ECG dated 04/27/2019 was personally reviewed today and demonstrated:  NSR- HR 71  Recent Labs: 02/02/2020: ALT 8; BUN 20; Creatinine, Ser 1.21; Hemoglobin 12.9; Platelets 222; Potassium 4.1; Sodium 142   Recent Lipid Panel Lab Results  Component Value Date/Time   CHOL 123 02/02/2020 03:35 PM   TRIG 57 02/02/2020 03:35 PM   HDL 37 (L) 02/02/2020 03:35 PM   CHOLHDL 3.3 02/02/2020 03:35 PM   CHOLHDL 4.3 02/11/2018 03:45 AM   LDLCALC 73 02/02/2020 03:35 PM    Wt Readings from Last 3 Encounters:  05/02/20 259 lb (117.5 kg)  04/30/20 269 lb 6.4 oz (122.2 kg)  04/05/20 269 lb (122 kg)     Risk Assessment/Calculations:      Objective:    Vital Signs:  BP 127/67   Pulse 66   Ht 5\' 9"  (1.753 m)   Wt 259 lb (117.5 kg)   BMI 38.25 kg/m    VITAL SIGNS:  reviewed  ASSESSMENT & PLAN:    H/O embolic stroke- Negative work up.    S/P Loop recorder- No arrhythmia documented to date.   HTN- Controlled  HLD- LDL 73 Sept 2021 on statin Rx  Plan- Same Rx- f/u in the office in one year.   Shared Decision Making/Informed Consent        COVID-19 Education: The signs and symptoms of COVID-19 were discussed with the patient and how to seek care for testing (follow up with PCP or arrange E-visit).  The importance of social distancing was discussed today.  Time:   Today, I have spent 10 minutes with the patient with telehealth technology discussing the above problems.     Medication Adjustments/Labs and Tests Ordered: Current medicines are reviewed at length with the patient today.  Concerns regarding medicines are outlined above.   Tests Ordered: No orders of the defined types were placed in this  encounter.   Medication Changes: No orders of the defined types were placed in this encounter.   Follow Up:  In Person with Dr 03-03-1998   Signed, Allyson Sabal, PA-C  05/02/2020 8:50 AM    [facility]

## 2020-05-02 NOTE — Patient Instructions (Signed)

## 2020-05-08 ENCOUNTER — Ambulatory Visit: Payer: Medicaid Other | Attending: Internal Medicine

## 2020-05-08 DIAGNOSIS — Z23 Encounter for immunization: Secondary | ICD-10-CM

## 2020-05-08 NOTE — Progress Notes (Signed)
   Covid-19 Vaccination Clinic  Name:  Amari Zagal    MRN: [id number] DOB: March 18, 1956  05/08/2020  Mr. Vert was observed post Covid-19 immunization for 15 minutes without incident. He was provided with Vaccine Information Sheet and instruction to access the V-Safe system.   Mr. Ruhe was instructed to call 911 with any severe reactions post vaccine: Marland Kitchen Difficulty breathing  . Swelling of face and throat  . A fast heartbeat  . A bad rash all over body  . Dizziness and weakness   Immunizations Administered    No immunizations on file.

## 2020-05-13 LAB — CUP PACEART REMOTE DEVICE CHECK
Date Time Interrogation Session: 20211210003038
Implantable Pulse Generator Implant Date: 20201214

## 2020-05-14 ENCOUNTER — Ambulatory Visit (INDEPENDENT_AMBULATORY_CARE_PROVIDER_SITE_OTHER): Payer: Medicaid Other

## 2020-05-14 DIAGNOSIS — I639 Cerebral infarction, unspecified: Secondary | ICD-10-CM | POA: Diagnosis not present

## 2020-05-28 NOTE — Progress Notes (Signed)
Carelink Summary Report / Loop Recorder 

## 2020-06-18 ENCOUNTER — Ambulatory Visit (INDEPENDENT_AMBULATORY_CARE_PROVIDER_SITE_OTHER): Payer: Medicaid Other

## 2020-06-18 DIAGNOSIS — I639 Cerebral infarction, unspecified: Secondary | ICD-10-CM

## 2020-06-20 ENCOUNTER — Ambulatory Visit: Payer: Medicaid Other | Admitting: Podiatry

## 2020-06-20 LAB — CUP PACEART REMOTE DEVICE CHECK
Date Time Interrogation Session: 20220111230106
Implantable Pulse Generator Implant Date: 20201214

## 2020-07-02 NOTE — Progress Notes (Signed)
Carelink Summary Report / Loop Recorder 

## 2020-07-04 ENCOUNTER — Emergency Department (HOSPITAL_COMMUNITY): Payer: Medicaid Other

## 2020-07-04 ENCOUNTER — Encounter (HOSPITAL_COMMUNITY): Payer: Self-pay

## 2020-07-04 ENCOUNTER — Emergency Department (HOSPITAL_COMMUNITY)
Admission: EM | Admit: 2020-07-04 | Discharge: 2020-07-05 | Disposition: A | Discharge status: Home / Self Care | Payer: Medicaid Other | Attending: Emergency Medicine | Admitting: Emergency Medicine

## 2020-07-04 DIAGNOSIS — I2510 Atherosclerotic heart disease of native coronary artery without angina pectoris: Secondary | ICD-10-CM | POA: Diagnosis not present

## 2020-07-04 DIAGNOSIS — W19XXXA Unspecified fall, initial encounter: Secondary | ICD-10-CM | POA: Diagnosis not present

## 2020-07-04 DIAGNOSIS — I129 Hypertensive chronic kidney disease with stage 1 through stage 4 chronic kidney disease, or unspecified chronic kidney disease: Secondary | ICD-10-CM | POA: Insufficient documentation

## 2020-07-04 DIAGNOSIS — S8261XA Displaced fracture of lateral malleolus of right fibula, initial encounter for closed fracture: Secondary | ICD-10-CM | POA: Diagnosis not present

## 2020-07-04 DIAGNOSIS — Y92481 Parking lot as the place of occurrence of the external cause: Secondary | ICD-10-CM | POA: Diagnosis not present

## 2020-07-04 DIAGNOSIS — S82891A Other fracture of right lower leg, initial encounter for closed fracture: Secondary | ICD-10-CM

## 2020-07-04 DIAGNOSIS — Z79899 Other long term (current) drug therapy: Secondary | ICD-10-CM | POA: Diagnosis not present

## 2020-07-04 DIAGNOSIS — Z7982 Long term (current) use of aspirin: Secondary | ICD-10-CM | POA: Insufficient documentation

## 2020-07-04 DIAGNOSIS — N189 Chronic kidney disease, unspecified: Secondary | ICD-10-CM | POA: Diagnosis not present

## 2020-07-04 DIAGNOSIS — S99911A Unspecified injury of right ankle, initial encounter: Secondary | ICD-10-CM | POA: Diagnosis not present

## 2020-07-04 DIAGNOSIS — W1830XA Fall on same level, unspecified, initial encounter: Secondary | ICD-10-CM | POA: Insufficient documentation

## 2020-07-04 DIAGNOSIS — R531 Weakness: Secondary | ICD-10-CM | POA: Diagnosis not present

## 2020-07-04 DIAGNOSIS — Z87891 Personal history of nicotine dependence: Secondary | ICD-10-CM | POA: Diagnosis not present

## 2020-07-04 DIAGNOSIS — X501XXA Overexertion from prolonged static or awkward postures, initial encounter: Secondary | ICD-10-CM | POA: Insufficient documentation

## 2020-07-04 DIAGNOSIS — M7989 Other specified soft tissue disorders: Secondary | ICD-10-CM | POA: Diagnosis not present

## 2020-07-04 DIAGNOSIS — R52 Pain, unspecified: Secondary | ICD-10-CM | POA: Diagnosis not present

## 2020-07-04 DIAGNOSIS — S8254XA Nondisplaced fracture of medial malleolus of right tibia, initial encounter for closed fracture: Secondary | ICD-10-CM | POA: Diagnosis not present

## 2020-07-04 MED ORDER — ONDANSETRON 4 MG PO TBDP: 4.0000 mg | ORAL_TABLET | Freq: Three times a day (TID) | ORAL | Status: DC | PRN

## 2020-07-04 MED ORDER — IBUPROFEN 800 MG PO TABS: 800.0000 mg | ORAL_TABLET | Freq: Three times a day (TID) | ORAL | 0 refills | Status: DC

## 2020-07-04 MED ORDER — IBUPROFEN 800 MG PO TABS
800.0000 mg | ORAL_TABLET | Freq: Once | ORAL | Status: DC
  Filled 2020-07-04: qty 1

## 2020-07-04 MED ORDER — ACETAMINOPHEN 500 MG PO TABS: 1000.0000 mg | ORAL_TABLET | Freq: Once | ORAL | Status: DC

## 2020-07-04 MED ORDER — ZOLPIDEM TARTRATE 5 MG PO TABS
5.0000 mg | ORAL_TABLET | Freq: Every evening | ORAL | Status: DC | PRN
  Administered 2020-07-04: 5 mg via ORAL
  Filled 2020-07-04: qty 1

## 2020-07-04 MED ORDER — IBUPROFEN 800 MG PO TABS: 800.0000 mg | ORAL_TABLET | Freq: Four times a day (QID) | ORAL | Status: DC | PRN

## 2020-07-04 MED ORDER — ACETAMINOPHEN 325 MG PO TABS
650.0000 mg | ORAL_TABLET | Freq: Four times a day (QID) | ORAL | Status: DC | PRN
  Administered 2020-07-04: 650 mg via ORAL
  Filled 2020-07-04: qty 2

## 2020-07-04 MED ORDER — ATORVASTATIN CALCIUM 40 MG PO TABS
40.0000 mg | ORAL_TABLET | Freq: Every evening | ORAL | Status: DC
  Administered 2020-07-04: 40 mg via ORAL
  Filled 2020-07-04: qty 1

## 2020-07-04 MED ORDER — CYCLOBENZAPRINE HCL 10 MG PO TABS: 10.0000 mg | ORAL_TABLET | Freq: Two times a day (BID) | ORAL | 0 refills | Status: DC | PRN

## 2020-07-04 MED ORDER — AMLODIPINE BESYLATE 5 MG PO TABS
5.0000 mg | ORAL_TABLET | Freq: Every day | ORAL | Status: DC
  Administered 2020-07-04: 5 mg via ORAL
  Filled 2020-07-04: qty 1

## 2020-07-04 NOTE — Evaluation (Signed)
Physical Therapy Evaluation Patient Details Name: Robert Donovan MRN: [id number] DOB: Aug 31, 1955 Today's Date: 07/04/2020   History of Present Illness  Patient is 65 y.o. male with PMH significant for HTN, HLD, CAD, obesity, OA, CVA with Rt hemiparesis. Patient presents to [facility] after fall today at grocery store an inability to [facility] on Rt ankle. In ED patient found to have nondisplaced fracture of the medial malleolus and fracture of the posterior malleolus.    Clinical Impression  Robert Donovan is 65 y.o. male admitted with above HPI and diagnosis. Patient is currently limited by functional impairments below (see PT problem list). Patient lives alone and is modified independent with SPC for gait at baseline. He is greatly limited by NWB status on Rt LE due to ankle fracture and impaired balance and weakness at baseline. Patient required 2+ Mod assist to complete sit<>stand transfer with RW and was unable to maintain NWB to attempt small side steps. Assist required to prevent NWB to complete lateral side scoots while sitting EOB to reposition. Patient will benefit from continued skilled PT interventions to address impairments and progress independence with mobility, recommending SNF level follow up as pt will need 24/7 assist with mobility and +2 physical assist to complete transfers to wheelchair. He will need wheelchair, shower seat, RW, and BSC if he returns home. Acute PT will follow and progress as able.     Follow Up Recommendations Follow surgeon's recommendation for DC plan and follow-up therapies    Equipment Recommendations  Rolling walker with 5" wheels;Wheelchair (measurements PT);Wheelchair cushion (measurements PT);3in1 (PT) (shower chair)    Recommendations for Other Services       Precautions / Restrictions Precautions Precautions: Fall Restrictions Weight Bearing Restrictions: Yes RLE Weight Bearing: Non weight bearing      Mobility  Bed Mobility Overal bed mobility: Needs  Assistance Bed Mobility: Supine to Sit;Sit to Supine     Supine to sit: HOB elevated;Min assist Sit to supine: Min assist;HOB elevated;Min guard   General bed mobility comments: HOB slightly elevated, cues to bring bil LE's off EOB and assist required to press up with UE's to raise trunk and sit. Min assist to bring Rt LE onto bed due to extra weight from splint.    Transfers Overall transfer level: Needs assistance Equipment used: Rolling walker (2 wheeled) Transfers: Sit to/from Stand;Lateral/Scoot Transfers Sit to Stand: Mod assist;+2 physical assistance;+2 safety/equipment;From elevated surface        Lateral/Scoot Transfers: Min guard General transfer comment: Cues for safe hand placement and technique with RW. pt unable to maintain Rt LE NWB during transfer. pt able to march Rt LE up to follow NWB status but unable to maintain when attempting small side hop/scoot with Lt foot using  RW. Pt required min guard for safety to scoot laterally towards HOB while sitting and min assist to keep Rt LE NWB.  Ambulation/Gait             General Gait Details: pt unsafe to progress due to inability to maintain NWB on Rt LE  Stairs            Wheelchair Mobility    Modified Rankin (Stroke Patients Only)       Balance Overall balance assessment: Needs assistance;History of Falls Sitting-balance support: Feet supported Sitting balance-Leahy Scale: Fair     Standing balance support: During functional activity;Bilateral upper extremity supported Standing balance-Leahy Scale: Poor Standing balance comment: heavy reliance on RW and external support  Pertinent Vitals/Pain Pain Assessment: Faces Faces Pain Scale: Hurts a little bit Pain Location: Rt foot Pain Descriptors / Indicators: Discomfort Pain Intervention(s): Limited activity within patient's tolerance;Monitored during session;Repositioned    Home Living Family/patient  expects to be discharged to:: Private residence Living Arrangements: Alone Available Help at Discharge: Available PRN/intermittently;Family Type of Home: Apartment Home Access: Level entry (handicap entrance/cut out at sidewalk)     Home Layout: One level Home Equipment: Cane - single point;Walker - 2 wheels      Prior Function Level of Independence: Independent with assistive device(s)         Comments: pt reports independence with SPC for mobility. He lives alone and uses [employer] or taxi for transportation. occasionally pt's parents can help with transportation. His brother also lives close by.     Hand Dominance        Extremity/Trunk Assessment   Upper Extremity Assessment Upper Extremity Assessment: Overall WFL for tasks assessed;Defer to OT evaluation    Lower Extremity Assessment Lower Extremity Assessment: RLE deficits/detail;LLE deficits/detail RLE Deficits / Details: Rt hip flexion 3-/5 with seated marching, pt unable to meet resistance RLE: Unable to fully assess due to immobilization RLE Sensation: WNL RLE Coordination: WNL LLE Deficits / Details: 4/5 for ankle dorsi/plantar flexion, knee extension, and hip flexion in sitting. LLE Sensation: WNL LLE Coordination: WNL    Cervical / Trunk Assessment Cervical / Trunk Assessment: Normal;Other exceptions Cervical / Trunk Exceptions: large habitus  Communication   Communication: Expressive difficulties (residual deficits from CVA, pt reports trouble finding words)  Cognition Arousal/Alertness: Awake/alert Behavior During Therapy: WFL for tasks assessed/performed Overall Cognitive Status: No family/caregiver present to determine baseline cognitive functioning                                 General Comments: pt pleasant and alet/oriented to situation, self, place. He provides accurate HPI and PLOF and expresses concerns about mobility limitations related to prior CVA. He also requires increased time  for processing.      General Comments      Exercises     Assessment/Plan    PT Assessment Patient needs continued PT services  PT Problem List Decreased strength;Decreased range of motion;Decreased activity tolerance;Decreased balance;Decreased mobility;Decreased knowledge of use of DME;Decreased knowledge of precautions;Decreased cognition;Decreased safety awareness;Obesity;Pain       PT Treatment Interventions DME instruction;Gait training;Stair training;Functional mobility training;Therapeutic activities;Therapeutic exercise;Balance training;Patient/family education;Wheelchair mobility training    PT Goals (Current goals can be found in the Care Plan section)  Acute Rehab PT Goals Patient Stated Goal: be able to move PT Goal Formulation: With patient Time For Goal Achievement: 07/18/20 Potential to Achieve Goals: Good    Frequency 7X/week   Barriers to discharge        Co-evaluation               AM-PAC PT "6 Clicks" Mobility  Outcome Measure Help needed turning from your back to your side while in a flat bed without using bedrails?: A Little Help needed moving from lying on your back to sitting on the side of a flat bed without using bedrails?: A Little Help needed moving to and from a bed to a chair (including a wheelchair)?: A Lot Help needed standing up from a chair using your arms (e.g., wheelchair or bedside chair)?: A Lot Help needed to walk in hospital room?: Total Help needed climbing 3-5 steps with a railing? :  Total 6 Click Score: 12    End of Session Equipment Utilized During Treatment: Gait belt Activity Tolerance: Patient tolerated treatment well Patient left: in bed Nurse Communication: Mobility status PT Visit Diagnosis: Other abnormalities of gait and mobility (R26.89);Muscle weakness (generalized) (M62.81);Difficulty in walking, not elsewhere classified (R26.2)    Time: 9379-0240 PT Time Calculation (min) (ACUTE ONLY): 19  min   Charges:   PT Evaluation $PT Eval Moderate Complexity: 1 Mod          Wynn Maudlin, DPT Acute Rehabilitation Services Office [phone] Pager [phone]    Anitra Lauth 07/04/2020, 6:28 PM

## 2020-07-04 NOTE — ED Provider Notes (Addendum)
[facility]-EMERGENCY DEPT Provider Note   CSN: [id number] Arrival date & time: 07/04/20  1030     History Chief Complaint  Patient presents with  . Fall    Robert Donovan is a 65 y.o. male.  The history is provided by the patient. No language interpreter was used.  Fall     65 year old male with prior stroke and having residual right-sided hemiparesis, obesity, hypertension, brought here via EMS for evaluation of a fall.  Patient report he went to the grocery store today and upon leaving the store to get into the taxi, he lost his balance, fell and twisted his right ankle.  He denies hitting his head or loss of consciousness.  He did require help to get into the cab.  Once he was dropped off at home he realized he was unable to bear weight on his right ankle due to pain.  He call EMS to bring him here for further evaluation.  At this time he report 3 out of 10 sharp pain about the right ankle.  Pain is nonradiating.  He denies any headache, neck pain, back pain, hip pain, or knee pain.  He denies any new numbness or new weakness.  No precipitating symptoms prior to the fall.  He denies being on blood thinner medication.  No specific treatment tried.  Patient normally walks with a cane.  Past Medical History:  Diagnosis Date  . Arthritis   . High cholesterol   . Hypertension     Patient Active Problem List   Diagnosis Date Noted  . History of loop recorder 05/02/2020  . Carotid artery disease (HCC) 05/02/2020  . Right hemiparesis (HCC) 04/30/2020  . Obesity (BMI 35.0-39.9 without comorbidity) 04/30/2020  . Hyperlipidemia 04/26/2019  . Hypokalemia   . Acute embolic stroke (HCC)   . History of CVA (cerebrovascular accident) 02/11/2018  . Acute kidney injury superimposed on chronic kidney disease (HCC) 02/11/2018  . History of syphilis 02/11/2018  . Constipation 02/11/2018  . Osteoarthritis 02/11/2018  . Benign essential HTN 02/11/2018  . Acute  encephalopathy 02/10/2018    Past Surgical History:  Procedure Laterality Date  . TEE WITHOUT CARDIOVERSION N/A 02/16/2018   Procedure: TRANSESOPHAGEAL ECHOCARDIOGRAM (TEE);  Surgeon: Jodelle Red, MD;  [facility] END[facility]COPY;  Service: Cardiovascular;  Laterality: N/A;       Family History  Problem Relation Age of Onset  . Healthy Mother   . Heart attack Father   . Diabetes Brother     Social History   Tobacco Use  . Smoking status: Former Games developer  . Smokeless tobacco: Never Used  Vaping Use  . Vaping Use: Never used  Substance Use Topics  . Alcohol use: Not Currently  . Drug use: Not Currently    Comment: hx marijuana use    Home Medications Prior to Admission medications   Medication Sig Start Date End Date Taking? Authorizing Provider  amLODipine (NORVASC) 5 MG tablet Take 1 tablet (5 mg total) by mouth daily. for blood pressure 02/02/20   Anders Simmonds, PA-C  aspirin EC 81 MG tablet Take 1 tablet (81 mg total) by mouth daily. 02/02/20   Anders Simmonds, PA-C  atorvastatin (LIPITOR) 40 MG tablet Take 1 in the evening 02/02/20   Anders Simmonds, PA-C  calcium-vitamin D (OSCAL WITH D) 500-200 MG-UNIT tablet Take 1 tablet by mouth. OTC    [provider]  lisinopril-hydrochlorothiazide (ZESTORETIC) 20-25 MG tablet TAKE 2 TABLETS BY MOUTH DAILY FOR BLOOD PRESSURE  02/02/20   Anders Simmonds, PA-C  Multiple Vitamins-Minerals (MENS MULTIVITAMIN PLUS PO) Take by mouth. OTC    [provider]  naproxen sodium (ALEVE) 220 MG tablet Take 220 mg by mouth. OTC PRN    [provider]  vitamin B-12 (CYANOCOBALAMIN) 500 MCG tablet Take 500 mcg by mouth daily. OTC    [provider]    Allergies    Hydrocodone and Oxycodone  Review of Systems   Review of Systems  All other systems reviewed and are negative.   Physical Exam Updated Vital Signs BP (!) 142/91 (BP Location: Right Arm)   Pulse 62   Temp 98.5 F (36.9 C) (Oral)    Resp 20   Ht 5\' 9"  (1.753 m)   Wt 120.2 kg   SpO2 98%   BMI 39.13 kg/m   Physical Exam Vitals and nursing note reviewed.  Constitutional:      General: He is not in acute distress.    Appearance: He is well-developed and well-nourished. He is obese.  HENT:     Head: Atraumatic.  Eyes:     Conjunctiva/sclera: Conjunctivae normal.  Abdominal:     Palpations: Abdomen is soft.  Musculoskeletal:        General: Tenderness (Right ankle: Tenderness to lateral ankle on palpation without obvious crepitus edema deformity or skin changes.  Intact DP pulse.  No tenderness to fifth metatarsal region.) present.     Cervical back: Neck supple.     Comments: No significant tenderness to right hip and right knee on palpation.  No midline spine tenderness  Skin:    Findings: No rash.  Neurological:     Mental Status: He is alert.     Comments: 4 out of 5 strength to right upper extremity, 5 out of 5 strength to left upper extremity, 4 out of 5 strength to right lower extremity, 5 out of 5 strength to left lower extremity  Psychiatric:        Mood and Affect: Mood and affect and mood normal.     ED Results / Procedures / Treatments   Labs (all labs ordered are listed, but only abnormal results are displayed) Labs Reviewed  CBC WITH DIFFERENTIAL/PLATELET  BASIC METABOLIC PANEL    EKG None  Radiology DG Ankle Complete Right  Result Date: 07/04/2020 CLINICAL DATA:  Fall EXAM: RIGHT ANKLE - COMPLETE 3+ VIEW COMPARISON:  None. FINDINGS: Nondisplaced fracture of the medial malleolus. Fracture of the posterior malleolus. No definite fracture of the fibula although there is significant soft tissue swelling laterally. There is also medial soft tissue swelling. Ankle mortise intact.  Small joint effusion. IMPRESSION: Fracture of the medial malleolus and posterior malleolus. Ankle mortise intact. Diffuse soft tissue swelling. Electronically Signed   By: 09/01/2020 M.D.   On: 07/04/2020 11:19     Procedures Procedures   Medications Ordered in ED Medications  ibuprofen (ADVIL) tablet 800 mg (800 mg Oral Patient Refused/Not Given 07/04/20 1123)  amLODipine (NORVASC) tablet 5 mg (has no administration in time range)  atorvastatin (LIPITOR) tablet 40 mg (has no administration in time range)  acetaminophen (TYLENOL) tablet 650 mg (has no administration in time range)  ondansetron (ZOFRAN-ODT) disintegrating tablet 4 mg (has no administration in time range)  zolpidem (AMBIEN) tablet 5 mg (has no administration in time range)    ED Course  I have reviewed the triage vital signs and the nursing notes.  Pertinent labs & imaging results that were available during my  care of the patient were reviewed by me and considered in my medical decision making (see chart for details).    MDM Rules/Calculators/A&P                          BP (!) 143/82   Pulse (!) 58   Temp 98.5 F (36.9 C) (Oral)   Resp 19   Ht 5\' 9"  (1.753 m)   Wt 120.2 kg   SpO2 100%   BMI 39.13 kg/m   Final Clinical Impression(s) / ED Diagnoses Final diagnoses:  Fracture of malleolus of right ankle, closed, initial encounter    Rx / DC Orders ED Discharge Orders         Ordered    ibuprofen (ADVIL) 800 MG tablet  3 times daily        07/04/20 1317    cyclobenzaprine (FLEXERIL) 10 MG tablet  2 times daily PRN        07/04/20 1317         11:09 AM Patient with prior stroke causing right hemiparesis normally use of a cane to walk who had what sounds like a mechanical fall earlier today.  His primary complaint is pain to his right ankle.  Will obtain x-ray.  No obvious other signs of injury noted.  Patient did have a c-collar however he does not have any significant cervical midline spine tenderness.  11:43 AM X-ray of the right ankle demonstrate fracture of the medial malleolus and posterior malleolus.  The ankle mortise is intact.  There is diffuse soft tissue swelling to the affected area.  This is a  closed injury, will place patient in a posterior splint and will provide crutches.  11:49 AM Patient report he has a walker at home that he will use as he prefers to use his cane.  Encourage patient to use his walker nonweightbearing on his ankle.  Will provide referral to orthopedist for outpatient care.   2:38 PM Unfortunately pt unable to use wheel chair due to having R side weakness due to prior stroke.  He is not safe to be non weight bearing on his right ankle.  I have discussed with our case manager and have ordered wheel chair along with outpt PT/OT needs.  Pt lives at home by himself.   5:39 PM Physical therapist evaluate patient.  States patient is unable to ambulate and is unsafe to return home.  Patient does qualify for skilled nursing facility.  Our case manager is aware and is actively looking for placement for him.  In the meantime, patient will be holding in the ED.  I have ordered basic labs as well as his home medication.     Durable Medical Equipment  (From admission, onward)         Start     Ordered   07/04/20 1432  For home use only DME 3 n 1  Once        07/04/20 1434   07/04/20 1432  For home use only DME lightweight manual wheelchair with seat cushion  Once       Comments: Patient suffers from right  fracture of the medial malleolus and posterior malleolus which impairs their ability to perform daily activities like standing, bathing, walking in the home.  A rolling walker will not resolve  issue with performing activities of daily living. A wheelchair will allow patient to safely perform daily activities. Patient is not able to propel themselves in  the home using a standard weight wheelchair due to pain, weakness. Patient can self propel in the lightweight wheelchair. Length of need lifetime. Accessories: elevating leg rests (ELRs), wheel locks, extensions and anti-tippers, back cushion  5'9"  120 kg   07/04/20 1434   07/04/20 1218  For home use only DME  wheelchair cushion (seat and back)  Once        07/04/20 1218           Fayrene Helper, PA-C 07/04/20 1318    Fayrene Helper, PA-C 07/04/20 1439    Milagros Loll, MD 07/04/20 1546    Fayrene Helper, PA-C 07/04/20 1740    Milagros Loll, MD 07/07/20 [phone]

## 2020-07-04 NOTE — ED Notes (Signed)
Contacted family of pt and arranged for them to take pt's personal medical equipment home that could not be picked up by [facility]. Equipment was transferred to family member at [facility].

## 2020-07-04 NOTE — ED Triage Notes (Signed)
Pt BIB [facility] from home. Witnessed fall in parking lot of store, lost balance getting into vehicle. Landed on R side, mostly ankle, some knee, hip, back. C/o lateral R ankle pain on palpation, unable to bear weight. No visible deformity, crepitus, bruising. No LOC, no blood thinners. A&Ox4. Pt arrives in c-collar. Prior CVA affected right leg.   BP 118/80 HR 66 RR 18 SpO2 98% RA Temp 97

## 2020-07-04 NOTE — Progress Notes (Signed)
.   Transition of Care [facility]) - Emergency Department Mini Assessment   Patient Details  Name: Robert Donovan MRN: [id number] Date of Birth: 06/08/1955  Transition of Care [facility]) CM/SW Contact:    Elliot Cousin, RN Phone Number: [phone] 07/04/2020, 3:57 PM   Clinical Narrative: TOC CM spoke to pt at bedside. States he lives alone and requested CM speak to his mother, [name]. Spoke to mother and states he has RW at home. She is concerned he lives alone. Pt will need wheelchair, Knee scooter, and bedside commode at home. Pt will be non weight bearing and may have difficulties maneuvering in home with DME. ED provider updated and PT eval ordered. Contacted [facility] PT to evaluated for mobility with DME. Pt will need PTAR home. Contacted [employer] for DME delivery to room and [employer] for wheelchair delivery. Scanned order for knee scooter and [place] Medicaid PCS application to his mother, at [email or web address]. Faxed [place] Medicaid PCS application to his PCP's office to complete and expedite.    ED Mini Assessment: What brought you to the Emergency Department? : fall  Barriers to Discharge: ED DME delivery  Barrier interventions: DME arranged, [place] Medicaid form sent to PCP  Means of departure: Ambulance  Interventions which prevented an admission or readmission: Transportation Screening,Home Health Consult or Services,DME Provided    Patient Contact and Communications        ,                 Admission diagnosis:  fall;ankle pain Patient Active Problem List   Diagnosis Date Noted  . History of loop recorder 05/02/2020  . Carotid artery disease (HCC) 05/02/2020  . Right hemiparesis (HCC) 04/30/2020  . Obesity (BMI 35.0-39.9 without comorbidity) 04/30/2020  . Hyperlipidemia 04/26/2019  . Hypokalemia   . Acute embolic stroke (HCC)   . History of CVA (cerebrovascular accident) 02/11/2018  . Acute kidney injury superimposed on chronic kidney disease (HCC) 02/11/2018  .  History of syphilis 02/11/2018  . Constipation 02/11/2018  . Osteoarthritis 02/11/2018  . Benign essential HTN 02/11/2018  . Acute encephalopathy 02/10/2018   PCP:  [name], MD Pharmacy:   [facility] [phone] - [place], [place] - [phone] AT [facility] [address] [place] [address] Phone: [phone] Fax: [phone]

## 2020-07-04 NOTE — ED Notes (Addendum)
[facility] called. Explained safe transport and pt doen't think he can get in his house on crutches and in his recliner via safe transport. York Spaniel he needs someone to put him in his recliner.

## 2020-07-04 NOTE — ED Notes (Signed)
Patient was given a carb modified dinner tray.

## 2020-07-04 NOTE — Progress Notes (Signed)
TOC CM spoke to pt's and mother, Mother states she will stay with him to care for him at home. DME delivered to pt, wheelchair, bedside commode and shower chair. Parents to pick up DME and take to his home. Waiting [facility]. Isidoro Donning RN CCM, [facility] ED TOC CM [phone]

## 2020-07-04 NOTE — Discharge Instructions (Signed)
You have been evaluated for your fall.  You have a broken right ankle.  Do not bear any weight on this ankle.  Use wheelchair to move about.  Take pain medication as needed for pain.  Call and follow-up closely with orthopedist for further care.

## 2020-07-04 NOTE — Progress Notes (Signed)
Orthopedic Tech Progress Note Patient Details:  Robert Donovan [birth year] [id number]  Ortho Devices Type of Ortho Device: Ace wrap,Post (short leg) splint,Stirrup splint Ortho Device/Splint Location: RLE Ortho Device/Splint Interventions: Ordered,Application   Post Interventions Patient Tolerated: Well Instructions Provided: Care of device   Jennye Moccasin 07/04/2020, 12:25 PM

## 2020-07-06 ENCOUNTER — Telehealth: Payer: Self-pay | Admitting: *Deleted

## 2020-07-06 NOTE — Telephone Encounter (Signed)
TOC CM received call from mother stating pt is requesting crutches to help maneuver in small places in his apt. ED provider updated and order received. Faxed order to [employer] for processing. Contacted [employer] rep, Velna Hatchet for processing for store pick up.  Mother will pick up in retail store on [address]. Isidoro Donning RN CCM, [facility] ED TOC CM [phone]

## 2020-07-10 ENCOUNTER — Telehealth: Payer: Self-pay | Admitting: Internal Medicine

## 2020-07-10 NOTE — Telephone Encounter (Signed)
Medication list has been printed and mailed out

## 2020-07-10 NOTE — Telephone Encounter (Signed)
Copied from CRM [id number]. Topic: General - Other >> Jul 10, 2020  3:16 PM Gaetana Michaelis A wrote: Patiricia from [employer] making contact to inquire about patient potentially receiving an Mining engineer wheelchair Elease Hashimoto would like to speak with PCP or a clinical staff member to further discuss patient's need

## 2020-07-10 NOTE — Telephone Encounter (Signed)
Will forward to provider  

## 2020-07-10 NOTE — Telephone Encounter (Signed)
Copied from CRM [id number]. Topic: General - Other >> Jul 10, 2020 12:58 PM Jaquita Rector A wrote: Reason for CRM: Patient and mom called in asking Dr Laural Benes to please send a list of his medication to his home address please ASAP. Any questions please call Ph# [phone]   Not sure if you can print this and mail it? We have to have medical records release to do it. If not I can call patient and inform them we need a medical records release.

## 2020-07-11 NOTE — Telephone Encounter (Signed)
Phone call returned the call of Whitney Muse.  I left message on her voicemail letting her know that I was returning her call in regards to this patient.  She can give me a call back tomorrow when I am back in office.

## 2020-07-12 ENCOUNTER — Ambulatory Visit: Payer: Medicaid Other | Admitting: Surgery

## 2020-07-12 ENCOUNTER — Telehealth: Payer: Self-pay

## 2020-07-12 NOTE — Telephone Encounter (Signed)
Completed PCS form faxed to [facility] CAC's with request to expedite referral.   Call placed to [facility] care management # [phone] regarding expediting referral. Message left with call back requested to this CM

## 2020-07-13 ENCOUNTER — Other Ambulatory Visit: Payer: Self-pay

## 2020-07-13 ENCOUNTER — Ambulatory Visit: Payer: Medicaid Other | Admitting: Podiatry

## 2020-07-13 ENCOUNTER — Ambulatory Visit (INDEPENDENT_AMBULATORY_CARE_PROVIDER_SITE_OTHER): Payer: Medicaid Other | Admitting: Podiatry

## 2020-07-13 ENCOUNTER — Encounter: Payer: Self-pay | Admitting: Podiatry

## 2020-07-13 DIAGNOSIS — N189 Chronic kidney disease, unspecified: Secondary | ICD-10-CM

## 2020-07-13 DIAGNOSIS — B351 Tinea unguium: Secondary | ICD-10-CM

## 2020-07-13 DIAGNOSIS — D689 Coagulation defect, unspecified: Secondary | ICD-10-CM

## 2020-07-13 DIAGNOSIS — I739 Peripheral vascular disease, unspecified: Secondary | ICD-10-CM

## 2020-07-13 DIAGNOSIS — M79674 Pain in right toe(s): Secondary | ICD-10-CM

## 2020-07-13 DIAGNOSIS — N179 Acute kidney failure, unspecified: Secondary | ICD-10-CM

## 2020-07-13 DIAGNOSIS — M79675 Pain in left toe(s): Secondary | ICD-10-CM

## 2020-07-13 NOTE — Progress Notes (Signed)
This patient returns to the office for at risk foot care.  This patient requires this care by a professional since this patient will be at risk due to having swelling, peripheral angiopathy acute kidney disease and symtomatic toenails.    These nails are painful walking and wearing shoes.  This patient presents for at risk foot care today.  Patient has broken foot and presents to the office in a wheelchair with his mother  General Appearance  Alert, conversant and in no acute stress.  Vascular  Dorsalis pedis palpable and posterior tibial  pulses are non palpable  bilaterally.  Capillary return is within normal limits  bilaterally. Temperature is within normal limits  Bilaterally. Bilateral swelling is present   Neurologic  Senn-Weinstein monofilament wire test within normal limits  bilaterally. Muscle power within normal limits bilaterally.  Nails Thick disfigured discolored nails with subungual debris  from hallux to fifth toes bilaterally. No evidence of bacterial infection or drainage bilaterally.  Orthopedic  No limitations of motion  feet .  No crepitus or effusions noted.  No bony pathology or digital deformities noted.  Skin  normotropic skin with no porokeratosis noted bilaterally.  No signs of infections or ulcers noted.    I  Assessment:  Onychomycosis  Pain in right toes  Pain in left toes, peripheral angiopathy with swelling bilateral.   Plan: Consent was obtained for treatment procedures.   Mechanical debridement of nails 1-5  bilaterally performed with a nail nipper.  Filed with dremel without incident.    Return office visit   3 months.    Told patient to return for periodic foot care and evaluation due to potential at risk complications.  Helane Gunther DPM

## 2020-07-13 NOTE — Telephone Encounter (Signed)
Robert Donovan with [facility] calling to ask if you can send the last OV notes to her direct fax at [phone]. so this request can be expedited.  cb [phone]

## 2020-07-16 NOTE — Telephone Encounter (Signed)
Call returned to [facility] and informed her that this CM will fax her the most recent provider notes, but the provider has not seen the patient since he fractured his ankle. This office is only able to send notes from providers in this clinic.  She said that would be fine. She attempted to call the patient 07/06/2020 and has not been able to reach him.  She will try again today and will also try his mother's phone number again.   Visit notes faxed as requested.

## 2020-07-18 ENCOUNTER — Ambulatory Visit: Payer: Self-pay

## 2020-07-18 ENCOUNTER — Encounter: Payer: Self-pay | Admitting: Surgery

## 2020-07-18 ENCOUNTER — Ambulatory Visit (INDEPENDENT_AMBULATORY_CARE_PROVIDER_SITE_OTHER): Payer: Medicaid Other | Admitting: Surgery

## 2020-07-18 ENCOUNTER — Telehealth: Payer: Self-pay | Admitting: Orthopaedic Surgery

## 2020-07-18 VITALS — BP 117/74 | HR 76

## 2020-07-18 DIAGNOSIS — S82891A Other fracture of right lower leg, initial encounter for closed fracture: Secondary | ICD-10-CM

## 2020-07-18 DIAGNOSIS — M25571 Pain in right ankle and joints of right foot: Secondary | ICD-10-CM

## 2020-07-18 LAB — CUP PACEART REMOTE DEVICE CHECK
Date Time Interrogation Session: 20220213230341
Implantable Pulse Generator Implant Date: 20201214

## 2020-07-18 NOTE — Telephone Encounter (Addendum)
Pt would like to have a home health aid to help the pt during the day and would also like a rx for a scooter; pt would like to know what he needs to do to have that approved?  [phone] or [phone]

## 2020-07-18 NOTE — Telephone Encounter (Signed)
Will discuss with pt at [facility] . [facility] thanks.

## 2020-07-18 NOTE — Progress Notes (Signed)
Office Visit Note   Patient: Robert Donovan           Date of Birth: April 21, 1956           MRN: [id number] Visit Date: 07/18/2020              Requested by: [name], MD [address] [place],  [place] [address] PCP: [name], MD   Assessment & Plan: Visit Diagnoses:  1. Pain in right ankle and joints of right foot   2. Closed fracture of right ankle, initial encounter     Plan: Today patient was put in a splint.  Will be nonweightbearing.  Follow-up with Dr. Ophelia Charter February 25th for recheck.  Follow-Up Instructions: No follow-ups on file.   Orders:  Orders Placed This Encounter  Procedures   XR Ankle Complete Right   No orders of the defined types were placed in this encounter.     Procedures: No procedures performed   Clinical Data: No additional findings.   Subjective: No chief complaint on file.   HPI 65 year old male was sent over by the ED for right ankle pain.  Fibrosing thousand 22 he presented after a fall leaving the grocery store.  Lost his balance and twisted his ankle.  X-rays in the ED showed fracture of the medial malleolus and posterior malleolus.  Ankle mortise intact.  Diffuse soft tissue swelling.  Patient has a history of stroke with right-sided hemiparesis. Review of Systems No current cardiopulmonary GI/GU issues  Objective: Vital Signs: BP 117/74   Pulse 76   Physical Exam HENT:     Head: Normocephalic.     Nose: Nose normal.  Eyes:     Extraocular Movements: Extraocular movements intact.  Musculoskeletal:     Comments: Right ankle swelling.  Tender over the medial aspect.  Neurological:     Mental Status: He is alert.  Psychiatric:        Mood and Affect: Mood normal.     Ortho Exam  Specialty Comments:  No specialty comments available.  Imaging: No results found.   PMFS History: Patient Active Problem List   Diagnosis Date Noted   History of loop recorder 05/02/2020   Carotid artery disease  (HCC) 05/02/2020   Right hemiparesis (HCC) 04/30/2020   Obesity (BMI 35.0-39.9 without comorbidity) 04/30/2020   Hyperlipidemia 04/26/2019   Hypokalemia    Acute embolic stroke (HCC)    History of CVA (cerebrovascular accident) 02/11/2018   Acute kidney injury superimposed on chronic kidney disease (HCC) 02/11/2018   History of syphilis 02/11/2018   Constipation 02/11/2018   Osteoarthritis 02/11/2018   Benign essential HTN 02/11/2018   Acute encephalopathy 02/10/2018   Past Medical History:  Diagnosis Date   Arthritis    High cholesterol    Hypertension     Family History  Problem Relation Age of Onset   Healthy Mother    Heart attack Father    Diabetes Brother     Past Surgical History:  Procedure Laterality Date   TEE WITHOUT CARDIOVERSION N/A 02/16/2018   Procedure: TRANSESOPHAGEAL ECHOCARDIOGRAM (TEE);  Surgeon: Jodelle Red, MD;  Location: [facility] ENDOSCOPY;  Service: Cardiovascular;  Laterality: N/A;   Social History   Occupational History   Not on file  Tobacco Use   Smoking status: Former Smoker   Smokeless tobacco: Never Used  Building services engineer Use: Never used  Substance and Sexual Activity   Alcohol use: Not Currently   Drug use: Not Currently  Comment: hx marijuana use   Sexual activity: Not Currently    Partners: Female    Birth control/protection: None    Comment: last encounter 2011

## 2020-07-18 NOTE — Telephone Encounter (Signed)
LMOM for patient of the below message  

## 2020-07-19 ENCOUNTER — Telehealth: Payer: Self-pay | Admitting: Orthopaedic Surgery

## 2020-07-19 ENCOUNTER — Other Ambulatory Visit: Payer: Self-pay | Admitting: Physician Assistant

## 2020-07-19 DIAGNOSIS — I129 Hypertensive chronic kidney disease with stage 1 through stage 4 chronic kidney disease, or unspecified chronic kidney disease: Secondary | ICD-10-CM

## 2020-07-19 DIAGNOSIS — N182 Chronic kidney disease, stage 2 (mild): Secondary | ICD-10-CM

## 2020-07-19 NOTE — Telephone Encounter (Signed)
Pts mom Julieanne Cotton called wanting to know wether Dr.Yates decided if the pt was going to be having surgery? She states he needs to have a procedure to have all his teeth removed and DR. Ophelia Charter wanted to hold off on that until he decided if he would be doing surgery. Julieanne Cotton would like a CB to further discuss.  [phone] Available to 4:30 pm  [phone]

## 2020-07-19 NOTE — Telephone Encounter (Signed)
Future visit in 2 weeks  

## 2020-07-19 NOTE — Telephone Encounter (Signed)
Please advise 

## 2020-07-19 NOTE — Telephone Encounter (Signed)
No surgery planned at this time. Will likely apply cast on ROV. [facility] thanks.  OK for teeth work .

## 2020-07-19 NOTE — Telephone Encounter (Signed)
Mom aware of the below message from [place]

## 2020-07-23 ENCOUNTER — Ambulatory Visit (INDEPENDENT_AMBULATORY_CARE_PROVIDER_SITE_OTHER): Payer: Medicaid Other

## 2020-07-23 DIAGNOSIS — I639 Cerebral infarction, unspecified: Secondary | ICD-10-CM | POA: Diagnosis not present

## 2020-07-26 NOTE — Progress Notes (Signed)
Carelink Summary Report / Loop Recorder 

## 2020-07-27 ENCOUNTER — Encounter: Payer: Self-pay | Admitting: Orthopaedic Surgery

## 2020-07-27 ENCOUNTER — Ambulatory Visit (INDEPENDENT_AMBULATORY_CARE_PROVIDER_SITE_OTHER): Payer: Medicaid Other | Admitting: Orthopaedic Surgery

## 2020-07-27 ENCOUNTER — Other Ambulatory Visit: Payer: Self-pay

## 2020-07-27 DIAGNOSIS — S82841A Displaced bimalleolar fracture of right lower leg, initial encounter for closed fracture: Secondary | ICD-10-CM | POA: Diagnosis not present

## 2020-07-27 NOTE — Progress Notes (Signed)
Office Visit Note   Patient: Robert Donovan           Date of Birth: 04-09-1956           MRN: [id number] Visit Date: 07/27/2020              Requested by: [name], MD [address] [place],  [place] [address] PCP: [name], MD   Assessment & Plan: Visit Diagnoses:  1. Closed bimalleolar fracture of right ankle, initial encounter     Plan: Shirley fiberglass cast applied for bimalleolar ankle fracture with plan for conservative treatment he will be nonweightbearing.  He is using his sister's power chair which is short for him.  He can talk with his neurologist about whether he needs a manual wheelchair or power wheelchair since they followed him for his previous CVAs.  Currently is in a cast with a broken ankle and I am unable to assess whether he meets Medicare criteria for a power chair.  He will return in 5 weeks for cast off x-rays of his ankle and probable cam boot. Decision for closed treatment nonoperative made today and discussed in detail with pt and wife.  GLOBAL. Follow-Up Instructions: No follow-ups on file.   Orders:  No orders of the defined types were placed in this encounter.  No orders of the defined types were placed in this encounter.     Procedures: No procedures performed   Clinical Data: No additional findings.   Subjective: Chief Complaint  Patient presents with  . Right Ankle - Fracture, Follow-up    HPI follow-up right bimalleolar ankle fracture essentially nondisplaced which involves the medial and posterior malleolus.  Patient is in a power chair he got from the sister he said previous stroke with hemiparesis.  He has been followed by neurology.  He has right hemiparesis. Review of Systems Updated noncontributory to HPI.  Objective: Vital Signs: Ht 5\' 9"  (1.753 m)   Wt 265 lb (120.2 kg)   BMI 39.13 kg/m   Physical Exam unchanged General exam.  Extraocular movements intact patient's alert conversive.  No rash over  exposed skin no shortness of breath no dyspnea.  Pulse regular.  Ortho Exam splint removed short leg fiberglass cast applied when he gets home we will elevated for the next few days since he may have some expected swelling late in the day making the cast tight.  Specialty Comments:  No specialty comments available.  Imaging: No results found.   PMFS History: Patient Active Problem List   Diagnosis Date Noted  . Closed bimalleolar fracture of right ankle 07/27/2020  . History of loop recorder 05/02/2020  . Carotid artery disease (HCC) 05/02/2020  . Right hemiparesis (HCC) 04/30/2020  . Obesity (BMI 35.0-39.9 without comorbidity) 04/30/2020  . Hyperlipidemia 04/26/2019  . Hypokalemia   . Acute embolic stroke (HCC)   . History of CVA (cerebrovascular accident) 02/11/2018  . Acute kidney injury superimposed on chronic kidney disease (HCC) 02/11/2018  . History of syphilis 02/11/2018  . Constipation 02/11/2018  . Osteoarthritis 02/11/2018  . Benign essential HTN 02/11/2018  . Acute encephalopathy 02/10/2018   Past Medical History:  Diagnosis Date  . Arthritis   . High cholesterol   . Hypertension     Family History  Problem Relation Age of Onset  . Healthy Mother   . Heart attack Father   . Diabetes Brother     Past Surgical History:  Procedure Laterality Date  . TEE WITHOUT CARDIOVERSION  N/A 02/16/2018   Procedure: TRANSESOPHAGEAL ECHOCARDIOGRAM (TEE);  Surgeon: Jodelle Red, MD;  Location: [facility] ENDOSCOPY;  Service: Cardiovascular;  Laterality: N/A;   Social History   Occupational History  . Not on file  Tobacco Use  . Smoking status: Former Games developer  . Smokeless tobacco: Never Used  Vaping Use  . Vaping Use: Never used  Substance and Sexual Activity  . Alcohol use: Not Currently  . Drug use: Not Currently    Comment: hx marijuana use  . Sexual activity: Not Currently    Partners: Female    Birth control/protection: None    Comment: last encounter 2011

## 2020-08-01 ENCOUNTER — Telehealth: Payer: Self-pay | Admitting: Internal Medicine

## 2020-08-01 NOTE — Telephone Encounter (Signed)
Called patient and LVM apologizing for the short notice but that his appointment for tomorrow had been cancelled due to the provider being out of the office. Advised patient to call back [phone] to schedule.

## 2020-08-01 NOTE — Telephone Encounter (Signed)
Copied from CRM [id number]. Topic: General - Other >> Jul 31, 2020  3:51 PM Jaquita Rector A wrote: Reason for CRM: Elease Hashimoto with [employer] called in to inquire of an Rx faxed over on 07/27/20 to Dr Laural Benes for patient to get evaluated for a new power chair. Say that it was already discussed with Dr Laural Benes.   Please call Ph# [phone]

## 2020-08-01 NOTE — Telephone Encounter (Signed)
We have paperwork. Spoke with Erskine Squibb and per Erskine Squibb Dr. Laural Benes will just need to sign because they set pt up with appointment. Will give form to provider in the morning

## 2020-08-02 ENCOUNTER — Inpatient Hospital Stay: Payer: Medicaid Other | Admitting: Physician Assistant

## 2020-08-02 NOTE — Telephone Encounter (Signed)
Paperwork has been faxed this morning  

## 2020-08-07 ENCOUNTER — Ambulatory Visit: Payer: Medicaid Other | Admitting: Podiatry

## 2020-08-09 ENCOUNTER — Other Ambulatory Visit: Payer: Self-pay | Admitting: Physician Assistant

## 2020-08-09 DIAGNOSIS — I129 Hypertensive chronic kidney disease with stage 1 through stage 4 chronic kidney disease, or unspecified chronic kidney disease: Secondary | ICD-10-CM

## 2020-08-09 DIAGNOSIS — I6523 Occlusion and stenosis of bilateral carotid arteries: Secondary | ICD-10-CM

## 2020-08-21 ENCOUNTER — Telehealth: Payer: Self-pay

## 2020-08-21 NOTE — Telephone Encounter (Signed)
   Primary Cardiologist: Nanetta Batty, MD  Chart reviewed as part of pre-operative protocol coverage. Given past medical history and time since last visit, based on ACC/AHA guidelines, Robert Donovan would be at acceptable risk for the planned procedure without further cardiovascular testing.   His aspirin may be held for 5-7 days prior to his surgery.  Please resume as soon as hemostasis is achieved at the discretion of the surgeon.  I will route this recommendation to the requesting party via Epic fax function and remove from pre-op pool.  Please call with questions.  Thomasene Ripple. Cleaver NP-C    08/21/2020, 1:44 PM [facility] [address] Office [phone] Fax [phone]

## 2020-08-21 NOTE — Telephone Encounter (Signed)
Cleared for surgery. OK to hold ASA 

## 2020-08-21 NOTE — Telephone Encounter (Signed)
Jden Maiello 65 year old male is requesting clearance for 19 teeth to be removed and alveoloplasty.  He was last seen by Corine Shelter, PA-C 05/02/2020.  He was doing well at that time.  He reported using his exercise bike and arm weights.  His blood pressure was well controlled at home.  Follow-up plan for 1 year.  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  May his aspirin be held prior to his procedure?  Thank you for your help.  Please direct your response to CV DIV periodical.  Thomasene Ripple. Cleaver NP-C    08/21/2020, 10:12 AM [facility] [address] Office [phone] Fax [phone]

## 2020-08-21 NOTE — Telephone Encounter (Signed)
   [facility] Pre-operative Risk Assessment    HEARTCARE STAFF: - Please ensure there is not already an duplicate clearance open for this procedure. - Under Visit Info/Reason for Call, type in Other and utilize the format Clearance MM/DD/YY or Clearance TBD. Do not use dashes or single digits. - If request is for dental extraction, please clarify the # of teeth to be extracted.  Request for surgical clearance:  1. What type of surgery is being performed? Dental extractionx19 teeth, alveoloplasty maxilla and mandible    2. When is this surgery scheduled? TBD   3. What type of clearance is required (medical clearance vs. Pharmacy clearance to hold med vs. Both)? Both  4. Are there any medications that need to be held prior to surgery and how long? ASA   5. Practice name and name of physician performing surgery? Vincent, Diona Browner, DMD   6. What is the office phone number? [phone]   7.   What is the office fax number? [phone]  8.   Anesthesia type (None, local, MAC, general) ? General    Robert Donovan 08/21/2020, 10:00 AM  _________________________________________________________________   Robert Donovan the requesting office to confirm number of teeth being extracted. Will be 19 teeth at one time in hospital

## 2020-08-26 LAB — CUP PACEART REMOTE DEVICE CHECK
Date Time Interrogation Session: 20220318230609
Implantable Pulse Generator Implant Date: 20201214

## 2020-08-27 ENCOUNTER — Ambulatory Visit (INDEPENDENT_AMBULATORY_CARE_PROVIDER_SITE_OTHER): Payer: Medicaid Other

## 2020-08-27 ENCOUNTER — Telehealth: Payer: Self-pay

## 2020-08-27 DIAGNOSIS — I639 Cerebral infarction, unspecified: Secondary | ICD-10-CM

## 2020-08-27 NOTE — Telephone Encounter (Signed)
Returned pt mother call. Pt mother wasn't at her desk left my name and contact info for her to give me a call back

## 2020-08-27 NOTE — Telephone Encounter (Signed)
Patient mother called wanted PCP to get in contact with her. Patient mother states that [facility] is requesting a call from PCP.  [facility] [phone]  Patient mother states [facility] is also requesting Home Health Services for Physical Therapy.  Patient mother Bernardo Heater) [phone]

## 2020-08-28 ENCOUNTER — Ambulatory Visit: Payer: Medicaid Other | Attending: Internal Medicine | Admitting: Internal Medicine

## 2020-08-28 ENCOUNTER — Other Ambulatory Visit: Payer: Self-pay

## 2020-08-28 ENCOUNTER — Telehealth: Payer: Self-pay | Admitting: Internal Medicine

## 2020-08-28 DIAGNOSIS — S82841S Displaced bimalleolar fracture of right lower leg, sequela: Secondary | ICD-10-CM

## 2020-08-28 DIAGNOSIS — I10 Essential (primary) hypertension: Secondary | ICD-10-CM

## 2020-08-28 DIAGNOSIS — I6930 Unspecified sequelae of cerebral infarction: Secondary | ICD-10-CM

## 2020-08-28 DIAGNOSIS — G8193 Hemiplegia, unspecified affecting right nondominant side: Secondary | ICD-10-CM | POA: Diagnosis not present

## 2020-08-28 DIAGNOSIS — R269 Unspecified abnormalities of gait and mobility: Secondary | ICD-10-CM

## 2020-08-28 NOTE — Progress Notes (Signed)
Please see telephone encounter for today for visit as well

## 2020-08-28 NOTE — Telephone Encounter (Signed)
Pt has an televisit with provider today

## 2020-08-28 NOTE — Telephone Encounter (Signed)
Copied from CRM [id number]. Topic: Referral - Request for Referral >> Aug 24, 2020  2:34 PM Gaetana Michaelis A wrote: Has patient seen PCP for this complaint? No  *If NO, is insurance requiring patient see PCP for this issue before PCP can refer them?  Referral for which specialty: Home Healthcare / Physical Therapy   Preferred provider/office: Eliezer Mccoy [phone] (patient's mother was uncertain of exact spelling)  Reason for referral: Physical therapy for foot  [name] has requested that someone reach out regarding this referral today 08/24/20 if possible

## 2020-08-28 NOTE — Progress Notes (Addendum)
Virtual Visit via Telephone Note  I connected with Robert Donovan on 08/28/20 at  4:35 p.m by telephone and verified that I am speaking with the correct person using two identifiers.  Location: Patient: home Provider: office  Participants: Myself Patient CMA: Ms. Robert Donovan   I discussed the limitations, risks, security and privacy concerns of performing an evaluation and management service by telephone and the availability of in person appointments. I also discussed with the patient that there may be a patient responsible charge related to this service. The patient expressed understanding and agreed to proceed.   History of Present Illness: Pt with hx of HTN, HL, multifocal LT sided embolic strokes (RT sided weakness lower>upper) loop recorder in place, PVD,  history of syphilis treated by ID Dr. Renold Don, knee arthritis, obesity.  Last seen 04/2020.  Since last visit, he sustained a fall the early part of last mth while trying to get into a taxi leaving the grocery store.  He loss his balance and fell.  fx RT ankle.  Seeing Dr. Ophelia Charter. Currently in cast and nonwgh bearing for 5 wks. according to Dr. Ophelia Charter note, when he returns in 5 weeks he will probably need a cam boot. -Patient reports difficulty in getting around in his apartment to perform ADLs since sustaining RT ankle fracture.  He has baseline right-sided weakness from previous embolic stroke.  -Prior to the fracture, he was walking very short distances in his apartment with a walker or cane but felt unsteady with that.  He also admits that his memory is poor and relies on his mother for help but she does not live with him.  He would like for me to call and speak with her also. -Was given manual WC from the ER post fx but the chair is too big/wide to use in his apartment.  Currently uses his sister's power chair when he has to go to doctors appointments but it is too small for him. Currently having to sleep in his living room because of  difficulty getting into his bedroom with the manual wheelchair. -Receiving home health through [facility].  They requested that he be evaluated for power chair.  Patient referred to physical therapy for this eval.  Was seen last week.  I have reviewed the therapist's note.  Patient's UE and LE strength and endurance was deemed not sufficient to participate in mobility related ADLs using a manual wheelchair.  Patient deemed not appropriate for scooter due to unsafe transfers.  She recommended a [employer] 3 power wheelchair.   -Patient's home has a level entry in our living areas including the bedroom, bathroom a wheelchair accessible.. Patient reports a home health aide came today for the first time.   HTN: compliant with taking medications. Has BP device and checks daily. Recent 133/78, 120/71. 110/72 No CP, SOB  HL:  Taking Lipitor   Observations/Objective: No direct observation done as this was a telephone visit.  Assessment and Plan:  1. Gait disturbance 2. Ankle fracture, bimalleolar, closed, right, sequela Patient with gait disturbance due to previous CVA with residual weakness in the right upper and lower extremities.  This most likely caused his fall with resultant ankle fracture.  Gait disturbance is now worsened by recent ankle fracture.  He has difficulties performing his ADLs as a result of this.  He lives alone. -He has been evaluated by physical therapy and deemed to be an appropriate candidate for a mobility power chair.  Please refer to the physical therapist  note for her evaluation and reasons why a manual wheelchair or other assistive devices would not be adequate to meet his needs.   -Patient states that he has been measured for the chair and his residence can accommodate the power chair and has handicap entrance.  Patient desires to remain independent as long as he can and I do think having a mobility chair will greatly assist  him in achieving that goal.   -Patient  currently in a cast and followed by Dr. Ophelia Charter for the ankle fracture.  3. Essential hypertension Reported home blood pressure readings are at goal.  Advised to continue current medications and low-salt diet.  4. History of CVA with residual deficit Patient to continue aspirin, atorvastatin and his current blood pressure medicines.  Follow Up Instructions: 3-4 mths   I discussed the assessment and treatment plan with the patient. The patient was provided an opportunity to ask questions and all were answered. The patient agreed with the plan and demonstrated an understanding of the instructions.   The patient was advised to call back or seek an in-person evaluation if the symptoms worsen or if the condition fails to improve as anticipated.  I  Spent 19 minutes on this telephone encounter  Robert Blue, MD

## 2020-08-29 ENCOUNTER — Telehealth: Payer: Self-pay | Admitting: Internal Medicine

## 2020-08-29 DIAGNOSIS — G8193 Hemiplegia, unspecified affecting right nondominant side: Secondary | ICD-10-CM | POA: Diagnosis not present

## 2020-08-29 NOTE — Telephone Encounter (Signed)
Copied from CRM [id number]. Topic: Appointment Scheduling - Scheduling Inquiry for Clinic >> Aug 28, 2020  4:33 PM Daphine Deutscher D wrote: Reason for CRM: Pt's mother Julieanne Cotton called asking if she could speak with a nurse regarding his appt today.  She said he does not remember anything regarding the appt except about his teeth getting fixed.  Mother [phone]

## 2020-08-29 NOTE — Telephone Encounter (Signed)
I spoke with patient's mother today.  She states that [name] lives alone and has had memory issues since his stroke.  She helps in his care.  She would like on his future visits to be able to be called in on the conversation or come with him.  I told her that is okay as long as it is approved by the patient which he did give me authorization yesterday to speak with her. She is following up on trying to get him a power wheelchair.  She states that patient was evaluated by the physical therapist last week and was told that based on his current condition and needs, he would qualify for a power wheelchair based on the physical therapist evaluation.  She is awaiting to hear further information on when he will get it.  She asked that I touch base with the physical therapist Bary Richard. [phone]. I subsequently called the physical therapist.  She states that she completed evaluation for motorized wheelchair.  She has indicated in her note why other ambulatory devices and  scooter would not be adequate for his needs.  She has sent her evaluation to [facility] and should be hearing from them soon with forms if I have not received them as yet.  I have not.

## 2020-08-30 ENCOUNTER — Other Ambulatory Visit: Payer: Self-pay | Admitting: Internal Medicine

## 2020-08-30 DIAGNOSIS — G8193 Hemiplegia, unspecified affecting right nondominant side: Secondary | ICD-10-CM | POA: Diagnosis not present

## 2020-08-30 DIAGNOSIS — I6523 Occlusion and stenosis of bilateral carotid arteries: Secondary | ICD-10-CM

## 2020-08-30 DIAGNOSIS — N182 Chronic kidney disease, stage 2 (mild): Secondary | ICD-10-CM

## 2020-08-30 DIAGNOSIS — I129 Hypertensive chronic kidney disease with stage 1 through stage 4 chronic kidney disease, or unspecified chronic kidney disease: Secondary | ICD-10-CM

## 2020-08-30 NOTE — Telephone Encounter (Signed)
Requested Prescriptions  Pending Prescriptions Disp Refills  . atorvastatin (LIPITOR) 40 MG tablet [Pharmacy Med Name: ATORVASTATIN 40MG  TABLETS] 90 tablet 2    Sig: TAKE 1 TABLET BY MOUTH IN THE EVENING     Cardiovascular:  Antilipid - Statins Failed - 08/30/2020  6:36 AM      Failed - LDL in normal range and within 360 days    LDL Chol Calc (NIH)  Date Value Ref Range Status  02/02/2020 73 0 - 99 mg/dL Final         Failed - HDL in normal range and within 360 days    HDL  Date Value Ref Range Status  02/02/2020 37 (L) >39 mg/dL Final         Passed - Total Cholesterol in normal range and within 360 days    Cholesterol, Total  Date Value Ref Range Status  02/02/2020 123 100 - 199 mg/dL Final         Passed - Triglycerides in normal range and within 360 days    Triglycerides  Date Value Ref Range Status  02/02/2020 57 0 - 149 mg/dL Final         Passed - Patient is not pregnant      Passed - Valid encounter within last 12 months    Recent Outpatient Visits          2 days ago Gait disturbance   [facility] 04/03/2020, MD   4 months ago Essential hypertension   [facility] [name], MD   7 months ago H/O positive serological reaction for syphilis   [facility] [place], [place], Marzella Schlein   1 year ago Essential hypertension   [facility] [place], [place], MD   1 year ago Abnormality of gait as late effect of cerebrovascular accident (CVA)   [facility] [place], [place], MD      Future Appointments            Tomorrow [place], MD [facility]           . amLODipine (NORVASC) 5 MG tablet [Pharmacy Med Name: AMLODIPINE BESYLATE 5MG  TABLETS] 90 tablet 0    Sig: TAKE 1 TABLET(5 MG) BY MOUTH DAILY FOR BLOOD PRESSURE     Cardiovascular:  Calcium Channel Blockers Passed - 08/30/2020  6:36 AM       Passed - Last BP in normal range    BP Readings from Last 1 Encounters:  07/18/20 117/74         Passed - Valid encounter within last 6 months    Recent Outpatient Visits          2 days ago Gait disturbance   [facility] 07/20/20, MD   4 months ago Essential hypertension   [facility], MD   7 months ago H/O positive serological reaction for syphilis   [facility] [place], [facility], [place]   1 year ago Essential hypertension   [facility] [place], [place], MD   1 year ago Abnormality of gait as late effect of cerebrovascular accident (CVA)   [facility] [place], [place], MD      Future Appointments            Tomorrow  Eldred Manges, MD [facility]

## 2020-08-31 ENCOUNTER — Ambulatory Visit (INDEPENDENT_AMBULATORY_CARE_PROVIDER_SITE_OTHER): Payer: Medicaid Other | Admitting: Orthopaedic Surgery

## 2020-08-31 ENCOUNTER — Ambulatory Visit (INDEPENDENT_AMBULATORY_CARE_PROVIDER_SITE_OTHER): Payer: Medicaid Other

## 2020-08-31 ENCOUNTER — Other Ambulatory Visit: Payer: Self-pay | Admitting: Orthopaedic Surgery

## 2020-08-31 ENCOUNTER — Encounter: Payer: Self-pay | Admitting: Orthopaedic Surgery

## 2020-08-31 ENCOUNTER — Other Ambulatory Visit: Payer: Self-pay

## 2020-08-31 DIAGNOSIS — S82841A Displaced bimalleolar fracture of right lower leg, initial encounter for closed fracture: Secondary | ICD-10-CM

## 2020-08-31 DIAGNOSIS — R531 Weakness: Secondary | ICD-10-CM | POA: Diagnosis not present

## 2020-08-31 DIAGNOSIS — G8193 Hemiplegia, unspecified affecting right nondominant side: Secondary | ICD-10-CM | POA: Diagnosis not present

## 2020-08-31 NOTE — Progress Notes (Signed)
   Post-Op Visit Note   Patient: Robert Donovan           Date of Birth: [birth year]           MRN: [id number] Visit Date: 08/31/2020 PCP: [name], MD   Assessment & Plan: Post me malleolar fracture.  He has some callus formation ankle still remains in satisfactory position is might might have a millimeter or so shortening of the medial malleolus.  No angulation of the ankle he basically is benign nonweightbearing but due to his previous stroke and weakness problems he still been putting some weight on it when he transfers.  New cast applied return 1 month cast off x-rays and probable cam boot at that time.  Chief Complaint:  Chief Complaint  Patient presents with  . Right Ankle - Pain, Follow-up   Visit Diagnoses:  1. Closed bimalleolar fracture of right ankle, initial encounter     Plan: Return in 1 month for cast off x-rays out of cast and cam boot.  Follow-Up Instructions: No follow-ups on file.   Orders:  Orders Placed This Encounter  Procedures  . XR Ankle Complete Right   No orders of the defined types were placed in this encounter.   Imaging: XR Ankle Complete Right  Result Date: 08/31/2020 Alona Bene is AP lateral mortise view right ankle obtained and reviewed.  This shows malleolar fracture slight callus formation.  Possible 1 mm shortening of the medial malleolus but no shifting of the talus.  Lateral malleolus appears intact. Impression: New malleolar fracture nonoperative treatment some interval callus formation healing.  Comparison 07/04/2020 images.   PMFS History: Patient Active Problem List   Diagnosis Date Noted  . Closed bimalleolar fracture of right ankle 07/27/2020  . History of loop recorder 05/02/2020  . Carotid artery disease (HCC) 05/02/2020  . Right hemiparesis (HCC) 04/30/2020  . Obesity (BMI 35.0-39.9 without comorbidity) 04/30/2020  . Hyperlipidemia 04/26/2019  . Hypokalemia   . Acute embolic stroke (HCC)   . History of CVA (cerebrovascular  accident) 02/11/2018  . Acute kidney injury superimposed on chronic kidney disease (HCC) 02/11/2018  . History of syphilis 02/11/2018  . Constipation 02/11/2018  . Osteoarthritis 02/11/2018  . Benign essential HTN 02/11/2018  . Acute encephalopathy 02/10/2018   Past Medical History:  Diagnosis Date  . Arthritis   . High cholesterol   . Hypertension     Family History  Problem Relation Age of Onset  . Healthy Mother   . Heart attack Father   . Diabetes Brother     Past Surgical History:  Procedure Laterality Date  . TEE WITHOUT CARDIOVERSION N/A 02/16/2018   Procedure: TRANSESOPHAGEAL ECHOCARDIOGRAM (TEE);  Surgeon: Jodelle Red, MD;  Location: [facility] ENDOSCOPY;  Service: Cardiovascular;  Laterality: N/A;   Social History   Occupational History  . Not on file  Tobacco Use  . Smoking status: Former Games developer  . Smokeless tobacco: Never Used  Vaping Use  . Vaping Use: Never used  Substance and Sexual Activity  . Alcohol use: Not Currently  . Drug use: Not Currently    Comment: hx marijuana use  . Sexual activity: Not Currently    Partners: Female    Birth control/protection: None    Comment: last encounter 2011

## 2020-08-31 NOTE — Telephone Encounter (Signed)
Returned pt mother call.Marland Kitchen Pt just wanted to know if provider knew about his broken his foot. Made pt aware that provider is aware

## 2020-09-03 DIAGNOSIS — G8193 Hemiplegia, unspecified affecting right nondominant side: Secondary | ICD-10-CM | POA: Diagnosis not present

## 2020-09-04 DIAGNOSIS — G8193 Hemiplegia, unspecified affecting right nondominant side: Secondary | ICD-10-CM | POA: Diagnosis not present

## 2020-09-05 DIAGNOSIS — G8193 Hemiplegia, unspecified affecting right nondominant side: Secondary | ICD-10-CM | POA: Diagnosis not present

## 2020-09-06 ENCOUNTER — Other Ambulatory Visit: Payer: Self-pay

## 2020-09-06 ENCOUNTER — Encounter: Payer: Self-pay | Admitting: Internal Medicine

## 2020-09-06 ENCOUNTER — Ambulatory Visit: Payer: Medicaid Other | Attending: Internal Medicine | Admitting: Internal Medicine

## 2020-09-06 VITALS — BP 110/68 | HR 79 | Resp 16 | Ht 68.0 in | Wt 280.4 lb

## 2020-09-06 DIAGNOSIS — G8193 Hemiplegia, unspecified affecting right nondominant side: Secondary | ICD-10-CM | POA: Diagnosis not present

## 2020-09-06 DIAGNOSIS — M25511 Pain in right shoulder: Secondary | ICD-10-CM | POA: Diagnosis not present

## 2020-09-06 DIAGNOSIS — S82841S Displaced bimalleolar fracture of right lower leg, sequela: Secondary | ICD-10-CM | POA: Diagnosis not present

## 2020-09-06 DIAGNOSIS — G8929 Other chronic pain: Secondary | ICD-10-CM | POA: Diagnosis not present

## 2020-09-06 DIAGNOSIS — I69351 Hemiplegia and hemiparesis following cerebral infarction affecting right dominant side: Secondary | ICD-10-CM | POA: Diagnosis not present

## 2020-09-06 DIAGNOSIS — Z9181 History of falling: Secondary | ICD-10-CM | POA: Diagnosis not present

## 2020-09-06 NOTE — Progress Notes (Addendum)
Patient ID: Robert Donovan, male    DOB: 1956-03-15  MRN: [id number]  CC: mobility assessment for power wheelchair  Subjective: Robert Donovan is a 65 y.o. male who presents for mobility assessment for power wheelchair.  Mother, is with him. His concerns today include:  Pt with hx of HTN, HL,multifocalLT sidedembolic strokes(RT sided weakness lower>upper) loop recorder in place,PVD,history of syphilis treated by ID Dr. Renold Don, knee arthritis, obesity.    Patient presenting today for mobility assessment for power wheelchair. He suffered a stroke several years ago that left him with residual weakness on his right side.  He has a walker and cane at home.  Even with the walker and cane, he felt off balance and fell intermittently.  - He fell in February of this year while trying to get into a taxi leaving the grocery store.  He had his cane with him but states he loss his balance. -Sustained a fracture to his right ankle.  Currently in a cast and is nonweightbearing.  Last saw orthopedics in follow-up a few days ago and new cast applied. Told to f/u in 1 mth. Has had difficulty performing his ADLs due to current nonweightbearing status and weakness on his RT side. -Given manual wheelchair from the ER post fracture but the chair is too big/wide to use in his apartment and he has difficulty trying to self propel given his right-sided weakness.  Also reports pain in the right shoulder from where he fell during a hospitalization in the past.  Also reports chronic pain in both knees due to arthritis -Currently uses his sisters power wheelchair when he is to go to doctor's appointments but it is too small for him. -Currently having to sleep in his living room because of difficulty getting into his bed down the hall in the manual WC.  Has set up a bedside commode in his living room also because he is unable to get to the bathroom. Pt lives alone. -Now has a home health aide who comes to assist with care  and meal preps as he cannot stand in his kitchen and again, cannot self propel in the manual WC -saw P.T 08/23/2020 for eval  Patient Active Problem List   Diagnosis Date Noted  . Closed bimalleolar fracture of right ankle 07/27/2020  . History of loop recorder 05/02/2020  . Carotid artery disease (HCC) 05/02/2020  . Right hemiparesis (HCC) 04/30/2020  . Obesity (BMI 35.0-39.9 without comorbidity) 04/30/2020  . Hyperlipidemia 04/26/2019  . Hypokalemia   . Acute embolic stroke (HCC)   . History of CVA (cerebrovascular accident) 02/11/2018  . Acute kidney injury superimposed on chronic kidney disease (HCC) 02/11/2018  . History of syphilis 02/11/2018  . Constipation 02/11/2018  . Osteoarthritis 02/11/2018  . Benign essential HTN 02/11/2018  . Acute encephalopathy 02/10/2018     Current Outpatient Medications on File Prior to Visit  Medication Sig Dispense Refill  . amLODipine (NORVASC) 5 MG tablet TAKE 1 TABLET(5 MG) BY MOUTH DAILY FOR BLOOD PRESSURE 90 tablet 0  . aspirin EC 81 MG tablet Take 1 tablet (81 mg total) by mouth daily. 90 tablet 3  . atorvastatin (LIPITOR) 40 MG tablet TAKE 1 TABLET BY MOUTH IN THE EVENING 90 tablet 2  . calcium-vitamin D (OSCAL WITH D) 500-200 MG-UNIT tablet Take 1 tablet by mouth daily.    Marland Kitchen lisinopril-hydrochlorothiazide (ZESTORETIC) 20-25 MG tablet TAKE 2 TABLETS BY MOUTH DAILY FOR BLOOD PRESSURE 60 tablet 4  . Multiple Vitamins-Minerals (MENS MULTIVITAMIN PLUS  PO) Take 1 tablet by mouth daily.    . vitamin B-12 (CYANOCOBALAMIN) 500 MCG tablet Take 500 mcg by mouth daily.     No current facility-administered medications on file prior to visit.    Allergies  Allergen Reactions  . Hydrocodone     Pt stated, "I am not allergic, I do not want to take this medicine ever" Codeine - "I do not want to ever take this medicine - not allergic"  . Oxycodone     Pt stated, "I am not allergic, I do not want to take this medicine ever"    Social History    Socioeconomic History  . Marital status: Single    Spouse name: Not on file  . Number of children: Not on file  . Years of education: Not on file  . Highest education level: Not on file  Occupational History  . Not on file  Tobacco Use  . Smoking status: Former Games developer  . Smokeless tobacco: Never Used  Vaping Use  . Vaping Use: Never used  Substance and Sexual Activity  . Alcohol use: Not Currently  . Drug use: Not Currently    Comment: hx marijuana use  . Sexual activity: Not Currently    Partners: Female    Birth control/protection: None    Comment: last encounter 2011  Other Topics Concern  . Not on file  Social History Narrative  . Not on file   Social Determinants of Health   Financial Resource Strain: Not on file  Food Insecurity: Not on file  Transportation Needs: Not on file  Physical Activity: Not on file  Stress: Not on file  Social Connections: Not on file  Intimate Partner Violence: Not on file    Family History  Problem Relation Age of Onset  . Healthy Mother   . Heart attack Father   . Diabetes Brother     Past Surgical History:  Procedure Laterality Date  . TEE WITHOUT CARDIOVERSION N/A 02/16/2018   Procedure: TRANSESOPHAGEAL ECHOCARDIOGRAM (TEE);  Surgeon: Jodelle Red, MD;  Location: [facility] ENDOSCOPY;  Service: Cardiovascular;  Laterality: N/A;    ROS: Review of Systems Negative except as stated above  PHYSICAL EXAM: BP 110/68   Pulse 79   Resp 16   Ht 5\' 8"  (1.727 m)   Wt 280 lb 6.4 oz (127.2 kg)   SpO2 96%   BMI 42.63 kg/m   Physical Exam   General appearance - alert, well appearing, obese African-American male sitting in a power wheelchair that is notably too small for him.  He is in no acute distress  Mental status -patient a little forgetful but answers questions appropriately. Neurological -power: Grip 3/5 right, 5/5 left.  Power right upper extremity proximally and distally 3+/5 right, 5/5 left upper extremity  proximally and distally. RLE: Hip flexion 2/5, knee extension 2/5.  LLE: Hip flexion 3+/5, knee extension and flexion 4/5 Musculoskeletal -patient has a cast on his right lower extremity that comes up to about an inch or two below the knee.  Gait not observed as patient is nonweightbearing.  However to stand up from the wheelchair to get his weight, he required hands-on assistance. Right shoulder: No point tenderness.  He has mild to moderate discomfort with passive range of motion in all directions. Knees: Mild joint enlargement.  No point tenderness.  Rather difficult to do flexion and extension of the right knee with him wearing a cast.  CMP Latest Ref Rng & Units 02/02/2020 08/05/2019 03/17/2019  Glucose 65 - 99 mg/dL 87 86 91  BUN 8 - 27 mg/dL 20 17 16   Creatinine 0.76 - 1.27 mg/dL 6.07 3.71  Sodium 134 - 144 mmol/L 142 139 142  Potassium 3.5 - 5.2 mmol/L 4.1 4.1 4.5  Chloride 96 - 106 mmol/L 105 103 106  CO2 20 - 29 mmol/L 24 23 22   Calcium 8.6 - 10.2 mg/dL 9.7 0.62 9.6  Total Protein 6.0 - 8.5 g/dL 7.2 7.1 6.9  Total Bilirubin 0.0 - 1.2 mg/dL 1.0 1.1 1.0  Alkaline Phos 48 - 121 IU/L 97 115 89  AST 0 - 40 IU/L 14 13 12   ALT 0 - 44 IU/L 8 9 7    Lipid Panel     Component Value Date/Time   CHOL 123 02/02/2020 1535   TRIG 57 02/02/2020 1535   HDL 37 (L) 02/02/2020 1535   CHOLHDL 3.3 02/02/2020 1535   CHOLHDL 4.3 02/11/2018 0345   VLDL 16 02/11/2018 0345   LDLCALC 73 02/02/2020 1535    CBC    Component Value Date/Time   WBC 10.8 02/02/2020 1535   WBC 10.4 02/11/2018 0345   RBC 4.49 02/02/2020 1535   RBC 5.06 02/11/2018 0345   HGB 12.9 (L) 02/02/2020 1535   HCT 39.9 02/02/2020 1535   PLT 222 02/02/2020 1535   MCV 89 02/02/2020 1535   MCH 28.7 02/02/2020 1535   MCH 28.5 02/11/2018 0345   MCHC 32.3 02/02/2020 1535   MCHC 32.4 02/11/2018 0345   RDW 13.1 02/02/2020 1535   LYMPHSABS 2.8 02/02/2020 1535   MONOABS 0.7 02/11/2018 0345   EOSABS 0.2 02/02/2020 1535    BASOSABS 0.1 02/02/2020 1535    ASSESSMENT AND PLAN: 1. Hemiparesis affecting right side as late effect of cerebrovascular accident (CVA) (HCC) 2. Personal history of fall 3. Ankle fracture, bimalleolar, closed, right, sequela 4. Chronic right shoulder pain -Patient seems appropriate for power wheelchair. Patient's right-sided weakness has led to gait disturbance and has resulted in falls.  He sustained a fracture to his right ankle in his most recent fall episode. -Strength in the right upper extremity and lower extremity is not sufficient to participate in mobility related ADLs using a manual wheelchair. -A cane or walker would not suffice as patient has had falls despite using these devices due to poor balance -Patient's decrease strength, handgrip, and poor balance makes a power scooter inappropriate for his needs.  Grip is not adequate for the tiller type steering of a scooter. Power WC is recommended to allow pt to safely negotiate his home and community environment. It will allow him to access all rooms in his apartment including the kitchen (to  to prepare meals), bedroom and bathroom.  The patient has the mental capacity to operate a power wheelchair safely and is willing to use it in the home. Addendum:  I have also reviewed the physical therapist's mobility/seating evaluation for power wheelchair and I agree with it.      Patient was given the opportunity to ask questions.  Patient verbalized understanding of the plan and was able to repeat key elements of the plan.   No orders of the defined types were placed in this encounter.    Requested Prescriptions    No prescriptions requested or ordered in this encounter    No follow-ups on file.  04/03/2020, MD, FACP

## 2020-09-07 DIAGNOSIS — G8193 Hemiplegia, unspecified affecting right nondominant side: Secondary | ICD-10-CM | POA: Diagnosis not present

## 2020-09-10 DIAGNOSIS — G8193 Hemiplegia, unspecified affecting right nondominant side: Secondary | ICD-10-CM | POA: Diagnosis not present

## 2020-09-10 NOTE — Progress Notes (Signed)
Carelink Summary Report / Loop Recorder 

## 2020-09-11 DIAGNOSIS — G8193 Hemiplegia, unspecified affecting right nondominant side: Secondary | ICD-10-CM | POA: Diagnosis not present

## 2020-09-12 DIAGNOSIS — G8193 Hemiplegia, unspecified affecting right nondominant side: Secondary | ICD-10-CM | POA: Diagnosis not present

## 2020-09-13 DIAGNOSIS — G8193 Hemiplegia, unspecified affecting right nondominant side: Secondary | ICD-10-CM | POA: Diagnosis not present

## 2020-09-14 DIAGNOSIS — G8193 Hemiplegia, unspecified affecting right nondominant side: Secondary | ICD-10-CM | POA: Diagnosis not present

## 2020-09-17 DIAGNOSIS — G8193 Hemiplegia, unspecified affecting right nondominant side: Secondary | ICD-10-CM | POA: Diagnosis not present

## 2020-09-18 DIAGNOSIS — G8193 Hemiplegia, unspecified affecting right nondominant side: Secondary | ICD-10-CM | POA: Diagnosis not present

## 2020-09-20 DIAGNOSIS — G8193 Hemiplegia, unspecified affecting right nondominant side: Secondary | ICD-10-CM | POA: Diagnosis not present

## 2020-09-21 DIAGNOSIS — G8193 Hemiplegia, unspecified affecting right nondominant side: Secondary | ICD-10-CM | POA: Diagnosis not present

## 2020-09-24 DIAGNOSIS — G8193 Hemiplegia, unspecified affecting right nondominant side: Secondary | ICD-10-CM | POA: Diagnosis not present

## 2020-09-25 ENCOUNTER — Ambulatory Visit: Payer: Medicaid Other | Admitting: Orthopaedic Surgery

## 2020-09-25 DIAGNOSIS — G8193 Hemiplegia, unspecified affecting right nondominant side: Secondary | ICD-10-CM | POA: Diagnosis not present

## 2020-09-26 DIAGNOSIS — R531 Weakness: Secondary | ICD-10-CM | POA: Diagnosis not present

## 2020-09-26 DIAGNOSIS — G8193 Hemiplegia, unspecified affecting right nondominant side: Secondary | ICD-10-CM | POA: Diagnosis not present

## 2020-09-29 ENCOUNTER — Other Ambulatory Visit: Payer: Self-pay | Admitting: Internal Medicine

## 2020-09-29 DIAGNOSIS — I129 Hypertensive chronic kidney disease with stage 1 through stage 4 chronic kidney disease, or unspecified chronic kidney disease: Secondary | ICD-10-CM

## 2020-09-29 DIAGNOSIS — N182 Chronic kidney disease, stage 2 (mild): Secondary | ICD-10-CM

## 2020-09-30 DIAGNOSIS — R531 Weakness: Secondary | ICD-10-CM | POA: Diagnosis not present

## 2020-10-01 ENCOUNTER — Ambulatory Visit (INDEPENDENT_AMBULATORY_CARE_PROVIDER_SITE_OTHER): Payer: Medicaid Other

## 2020-10-01 DIAGNOSIS — I639 Cerebral infarction, unspecified: Secondary | ICD-10-CM | POA: Diagnosis not present

## 2020-10-01 DIAGNOSIS — G8193 Hemiplegia, unspecified affecting right nondominant side: Secondary | ICD-10-CM | POA: Diagnosis not present

## 2020-10-02 ENCOUNTER — Ambulatory Visit (INDEPENDENT_AMBULATORY_CARE_PROVIDER_SITE_OTHER): Payer: Medicaid Other

## 2020-10-02 ENCOUNTER — Other Ambulatory Visit: Payer: Self-pay

## 2020-10-02 ENCOUNTER — Encounter: Payer: Self-pay | Admitting: Orthopaedic Surgery

## 2020-10-02 ENCOUNTER — Ambulatory Visit (INDEPENDENT_AMBULATORY_CARE_PROVIDER_SITE_OTHER): Payer: Medicaid Other | Admitting: Orthopaedic Surgery

## 2020-10-02 VITALS — BP 116/77 | HR 66

## 2020-10-02 DIAGNOSIS — S82841A Displaced bimalleolar fracture of right lower leg, initial encounter for closed fracture: Secondary | ICD-10-CM

## 2020-10-02 DIAGNOSIS — G8193 Hemiplegia, unspecified affecting right nondominant side: Secondary | ICD-10-CM | POA: Diagnosis not present

## 2020-10-02 LAB — CUP PACEART REMOTE DEVICE CHECK
Date Time Interrogation Session: 20220430230215
Implantable Pulse Generator Implant Date: 20201214

## 2020-10-02 NOTE — Progress Notes (Signed)
Office Visit Note   Patient: Robert Donovan           Date of Birth: 05/12/1956           MRN: [id number] Visit Date: 10/02/2020              Requested by: [name], MD [address] [place],  [place] [address] PCP: [name], MD   Assessment & Plan: Visit Diagnoses:  1. Closed bimalleolar fracture of right ankle, initial encounter     Plan: Cam boot applied.  Return in 6 weeks for cam boot removal and exam.  No x-ray needed on return.  Follow-Up Instructions: Return in about 6 weeks (around 11/13/2020).   Orders:  Orders Placed This Encounter  Procedures  . XR Ankle Complete Right   No orders of the defined types were placed in this encounter.     Procedures: No procedures performed   Clinical Data: No additional findings.   Subjective: Chief Complaint  Patient presents with  . Right Ankle - Fracture, Follow-up    HPI patient returns he is in his electric scooter has been in a cast and cast is removed today.  Exam out of cast demonstrates no tenderness at the medial malleolus.  Slight edema is noted.  Skin looks good no plantar or dorsal or medial foot problems.  Review of Systems updated unchanged.   Objective: Vital Signs: BP 116/77   Pulse 66   Physical Exam Constitutional:      Appearance: He is well-developed.  HENT:     Head: Normocephalic and atraumatic.  Eyes:     Pupils: Pupils are equal, round, and reactive to light.  Neck:     Thyroid: No thyromegaly.     Trachea: No tracheal deviation.  Cardiovascular:     Rate and Rhythm: Normal rate.  Pulmonary:     Effort: Pulmonary effort is normal.     Breath sounds: No wheezing.  Abdominal:     General: Bowel sounds are normal.     Palpations: Abdomen is soft.  Skin:    General: Skin is warm and dry.     Capillary Refill: Capillary refill takes less than 2 seconds.  Neurological:     Mental Status: He is alert and oriented to person, place, and time.  Psychiatric:         Behavior: Behavior normal.        Thought Content: Thought content normal.        Judgment: Judgment normal.     Ortho Exam weakness secondary to previous CVA.  Skin over his ankle is normal no tenderness over the medial malleolus or posterior malleolus.  Specialty Comments:  No specialty comments available.  Imaging: No results found.   PMFS History: Patient Active Problem List   Diagnosis Date Noted  . Hemiparesis affecting right side as late effect of cerebrovascular accident (CVA) (HCC) 09/06/2020  . Personal history of fall 09/06/2020  . Chronic right shoulder pain 09/06/2020  . Closed bimalleolar fracture of right ankle 07/27/2020  . History of loop recorder 05/02/2020  . Carotid artery disease (HCC) 05/02/2020  . Right hemiparesis (HCC) 04/30/2020  . Obesity (BMI 35.0-39.9 without comorbidity) 04/30/2020  . Hyperlipidemia 04/26/2019  . Hypokalemia   . Acute embolic stroke (HCC)   . History of CVA (cerebrovascular accident) 02/11/2018  . Acute kidney injury superimposed on chronic kidney disease (HCC) 02/11/2018  . History of syphilis 02/11/2018  . Constipation 02/11/2018  . Osteoarthritis 02/11/2018  .  Benign essential HTN 02/11/2018  . Acute encephalopathy 02/10/2018   Past Medical History:  Diagnosis Date  . Arthritis   . High cholesterol   . Hypertension     Family History  Problem Relation Age of Onset  . Healthy Mother   . Heart attack Father   . Diabetes Brother     Past Surgical History:  Procedure Laterality Date  . TEE WITHOUT CARDIOVERSION N/A 02/16/2018   Procedure: TRANSESOPHAGEAL ECHOCARDIOGRAM (TEE);  Surgeon: Jodelle Red, MD;  Location: [facility] ENDOSCOPY;  Service: Cardiovascular;  Laterality: N/A;   Social History   Occupational History  . Not on file  Tobacco Use  . Smoking status: Former Games developer  . Smokeless tobacco: Never Used  Vaping Use  . Vaping Use: Never used  Substance and Sexual Activity  . Alcohol use: Not Currently   . Drug use: Not Currently    Comment: hx marijuana use  . Sexual activity: Not Currently    Partners: Female    Birth control/protection: None    Comment: last encounter 2011

## 2020-10-03 DIAGNOSIS — G8193 Hemiplegia, unspecified affecting right nondominant side: Secondary | ICD-10-CM | POA: Diagnosis not present

## 2020-10-05 ENCOUNTER — Other Ambulatory Visit: Payer: Self-pay

## 2020-10-05 NOTE — Patient Instructions (Signed)
Visit Information  [name] was given information about Medicaid Managed Care team care coordination services as a part of their [facility] Medicaid benefit. Robert Donovan verbally consented to engagement with the [facility] Managed Care team.   For questions related to your [facility], please call: [phone] or visit the homepage here: [email or web address]  If you would like to schedule transportation through your [facility], please call the following number at least 2 days in advance of your appointment: [phone].   Call the Behavioral Health Crisis Line at [phone], at any time, 24 hours a day, 7 days a week. If you are in danger or need immediate medical attention call 911.  [name] - following are the goals we discussed in your visit today:  Goals Addressed   None      Social Worker will follow up in 14 days.   [name], BSW, [place] [facility]  [place]  High Risk Managed Medicaid Team  [phone]  Following is a copy of your plan of care:  There are no care plans to display for this patient.

## 2020-10-05 NOTE — Patient Outreach (Signed)
Medicaid Managed Care Social Work Note  10/05/2020 Name:  Robert Donovan MRN:  [id number] DOB:  [birth year]  Robert Donovan is an 65 y.o. year old male who is a primary patient of [name], MD.  The Medicaid Managed Care Coordination team was consulted for assistance with:  Community Resources   Robert Donovan was given information about Medicaid Managed CareCoordination services today. Robert Donovan agreed to services and verbal consent obtained.  Engaged with patient  for by telephone forinitial visit in response to referral for case management and/or care coordination services.   Assessments/Interventions:  Review of past medical history, allergies, medications, health status, including review of consultants reports, laboratory and other test data, was performed as part of comprehensive evaluation and provision of chronic care management services.  SDOH: (Social Determinant of Health) assessments and interventions performed: BSW received referral for patient from [facility]. Referral stated that patient needed some DME and a blood pressure cuff. BSW contacted patient's mother that handles everything for him. Mom states that patient is in need of a device for when/if he falls he can press the button. He needs a blood pressure cuff and another aide. Mom states that she had two aides that is supposed to assist patient in the home but they do not come. Mom does not remember the name of the agency, but would like a new agency to come into the home. BSW informed mom that she would get some additional  information from [facility] and send a message to patients PCP about having a prescription sent to [facility].   Advanced Directives Status:  Not addressed in this encounter.  Care Plan                 Allergies  Allergen Reactions  . Hydrocodone     Pt stated, "I am not allergic, I do not want to take this medicine ever" Codeine - "I do not want to ever take this medicine - not allergic"  . Oxycodone      Pt stated, "I am not allergic, I do not want to take this medicine ever"    Medications Reviewed Today    Reviewed by Robert Donovan, RT (Technologist) on 10/02/20 at 1415  Med List Status: <None>  Medication Order Taking? Sig Documenting Provider Last Dose Status Informant  amLODipine (NORVASC) 5 MG tablet [id number]  TAKE 1 TABLET(5 MG) BY MOUTH DAILY FOR BLOOD PRESSURE [name], MD  Active   aspirin EC 81 MG tablet [id number] No Take 1 tablet (81 mg total) by mouth daily. Anders Simmonds, [place] Taking Active Self  atorvastatin (LIPITOR) 40 MG tablet [id number]  TAKE 1 TABLET BY MOUTH IN THE Janese Banks, MD  Active   calcium-vitamin D (OSCAL WITH D) 500-200 MG-UNIT tablet [id number] No Take 1 tablet by mouth daily. [provider] Taking Active Self  lisinopril-hydrochlorothiazide (ZESTORETIC) 20-25 MG tablet [id number] No TAKE 2 TABLETS BY MOUTH DAILY FOR BLOOD PRESSURE Anders Simmonds, PA-C Taking Active   Multiple Vitamins-Minerals (MENS MULTIVITAMIN PLUS PO) [id number] No Take 1 tablet by mouth daily. [provider] Taking Active Self  vitamin B-12 (CYANOCOBALAMIN) 500 MCG tablet [id number] No Take 500 mcg by mouth daily. [provider] Taking Active Self          Patient Active Problem List   Diagnosis Date Noted  . Hemiparesis affecting right side as late effect of cerebrovascular accident (CVA) (HCC) 09/06/2020  . Personal history of fall 09/06/2020  .  Chronic right shoulder pain 09/06/2020  . Closed bimalleolar fracture of right ankle 07/27/2020  . History of loop recorder 05/02/2020  . Carotid artery disease (HCC) 05/02/2020  . Right hemiparesis (HCC) 04/30/2020  . Obesity (BMI 35.0-39.9 without comorbidity) 04/30/2020  . Hyperlipidemia 04/26/2019  . Hypokalemia   . Acute embolic stroke (HCC)   . History of CVA (cerebrovascular accident) 02/11/2018  . Acute kidney injury superimposed on chronic kidney disease (HCC)  02/11/2018  . History of syphilis 02/11/2018  . Constipation 02/11/2018  . Osteoarthritis 02/11/2018  . Benign essential HTN 02/11/2018  . Acute encephalopathy 02/10/2018    Conditions to be addressed/monitored per PCP order:  blood pressure cuff  There are no care plans that you recently modified to display for this patient.   Follow up:  Patient agrees to Care Plan and Follow-up.  Plan: The Managed Medicaid care management team will reach out to the patient again over the next 14 days.  Date/time of next scheduled Social Work care management/care coordination outreach:  10/25/20  [name], Kenard Gower, [facility]  [facility]  High Risk Managed Medicaid Team  [phone]

## 2020-10-08 DIAGNOSIS — G8193 Hemiplegia, unspecified affecting right nondominant side: Secondary | ICD-10-CM | POA: Diagnosis not present

## 2020-10-09 ENCOUNTER — Telehealth: Payer: Self-pay | Admitting: Internal Medicine

## 2020-10-09 NOTE — Telephone Encounter (Signed)
..   Medicaid Managed Care   Unsuccessful Outreach Note  10/09/2020 Name: Robert Donovan MRN: [id number] DOB: 02-04-1956  Referred by: [name], MD Reason for referral : Appointment and High Risk Managed Medicaid (Left message for the patient's mother to return my call at [phone].)   An unsuccessful telephone outreach was attempted today. The patient was referred to the case management team for assistance with care management and care coordination.    Follow Up Plan: The care management team will reach out to the patient again over the next 7-14 days.   [name] Care Guide, High Risk Medicaid Managed Care Embedded Care Coordination [facility]  [facility]

## 2020-10-10 DIAGNOSIS — G8193 Hemiplegia, unspecified affecting right nondominant side: Secondary | ICD-10-CM | POA: Diagnosis not present

## 2020-10-12 ENCOUNTER — Ambulatory Visit: Payer: Medicaid Other | Admitting: Podiatry

## 2020-10-12 ENCOUNTER — Encounter: Payer: Self-pay | Admitting: Podiatry

## 2020-10-12 ENCOUNTER — Other Ambulatory Visit: Payer: Self-pay

## 2020-10-12 DIAGNOSIS — B351 Tinea unguium: Secondary | ICD-10-CM | POA: Diagnosis not present

## 2020-10-12 DIAGNOSIS — M79674 Pain in right toe(s): Secondary | ICD-10-CM | POA: Diagnosis not present

## 2020-10-12 DIAGNOSIS — M79675 Pain in left toe(s): Secondary | ICD-10-CM | POA: Diagnosis not present

## 2020-10-12 DIAGNOSIS — I739 Peripheral vascular disease, unspecified: Secondary | ICD-10-CM

## 2020-10-12 DIAGNOSIS — D689 Coagulation defect, unspecified: Secondary | ICD-10-CM

## 2020-10-12 NOTE — Progress Notes (Signed)
This patient returns to the office for at risk foot care.  This patient requires this care by a professional since this patient will be at risk due to having swelling, peripheral angiopathy acute kidney disease and symtomatic toenails.    These nails are painful walking and wearing shoes.  This patient presents for at risk foot care today.  Patient has broken foot and presents to the office in a wheelchair with his mother  General Appearance  Alert, conversant and in no acute stress.  Vascular  Dorsalis pedis palpable and posterior tibial  pulses are non palpable  bilaterally.  Capillary return is within normal limits  bilaterally. Temperature is within normal limits  Bilaterally. Bilateral swelling is present   Neurologic  Senn-Weinstein monofilament wire test within normal limits  bilaterally. Muscle power within normal limits bilaterally.  Nails Thick disfigured discolored nails with subungual debris  from hallux to fifth toes bilaterally. No evidence of bacterial infection or drainage bilaterally.  Orthopedic  No limitations of motion  feet .  No crepitus or effusions noted.  No bony pathology or digital deformities noted.  Skin  normotropic skin with no porokeratosis noted bilaterally.  No signs of infections or ulcers noted.    I  Assessment:  Onychomycosis  Pain in right toes  Pain in left toes, peripheral angiopathy with swelling bilateral.   Plan: Consent was obtained for treatment procedures.   Mechanical debridement of nails 1-5  bilaterally performed with a nail nipper.  Filed with dremel without incident.    Return office visit   3 months.    Told patient to return for periodic foot care and evaluation due to potential at risk complications.  Helane Gunther DPM

## 2020-10-15 DIAGNOSIS — G8193 Hemiplegia, unspecified affecting right nondominant side: Secondary | ICD-10-CM | POA: Diagnosis not present

## 2020-10-17 NOTE — H&P (Signed)
HISTORY AND PHYSICAL  Robert Donovan is a 65 y.o. male patient with CC: Painful teeth.  No diagnosis found.  Past Medical History:  Diagnosis Date  . Arthritis   . High cholesterol   . Hypertension     No current facility-administered medications for this encounter.   Current Outpatient Medications  Medication Sig Dispense Refill  . amLODipine (NORVASC) 5 MG tablet TAKE 1 TABLET(5 MG) BY MOUTH DAILY FOR BLOOD PRESSURE (Patient taking differently: Take 5 mg by mouth daily.) 90 tablet 0  . aspirin EC 81 MG tablet Take 1 tablet (81 mg total) by mouth daily. 90 tablet 3  . atorvastatin (LIPITOR) 40 MG tablet TAKE 1 TABLET BY MOUTH IN THE EVENING (Patient taking differently: Take 40 mg by mouth every evening.) 90 tablet 2  . calcium-vitamin D (OSCAL WITH D) 500-200 MG-UNIT tablet Take 1 tablet by mouth daily.    Marland Kitchen lisinopril-hydrochlorothiazide (ZESTORETIC) 20-25 MG tablet TAKE 2 TABLETS BY MOUTH DAILY FOR BLOOD PRESSURE (Patient taking differently: Take 1 tablet by mouth 2 (two) times daily. FOR BLOOD PRESSURE) 60 tablet 4  . Multiple Vitamins-Minerals (MENS MULTIVITAMIN PLUS PO) Take 1 tablet by mouth daily.    . naproxen (NAPROSYN) 250 MG tablet Take 500 mg by mouth 2 (two) times daily with a meal.    . vitamin B-12 (CYANOCOBALAMIN) 500 MCG tablet Take 500 mcg by mouth daily.     Allergies  Allergen Reactions  . Hydrocodone     Pt stated, "I am not allergic, I do not want to take this medicine ever" Codeine - "I do not want to ever take this medicine - not allergic"  . Oxycodone     Pt stated, "I am not allergic, I do not want to take this medicine ever"   Active Problems:   * No active hospital problems. *  Vitals: There were no vitals taken for this visit. Lab results:No results found for this or any previous visit (from the past 24 hour(s)). Radiology Results: No results found. General appearance: alert, cooperative, no distress and moderately obese Head: Normocephalic,  without obvious abnormality, atraumatic Eyes: negative Nose: Nares normal. Septum midline. Mucosa normal. No drainage or sinus tenderness. Throat: Gross calculus, periodontal disease, fetid odor. All teeth non-restorable. Pharynx clear. Neck: no adenopathy and supple, symmetrical, trachea midline Resp: clear to auscultation bilaterally Cardio: regular rate and rhythm, S1, S2 normal, no murmur, click, rub or gallop  Assessment: Non-restorable teeth secondary to periodontal disease, caries.  Plan: Full mouth extractions with alveoloplasty. SGA. Day surgery.   Ocie Doyne 10/17/2020

## 2020-10-18 ENCOUNTER — Encounter (HOSPITAL_COMMUNITY): Payer: Self-pay | Admitting: Oral Surgery

## 2020-10-18 NOTE — Progress Notes (Addendum)
EKG: DOS CXR: 12/07/17 ECHO: 02/16/18 Stress Test: denies Cardiac Cath: denies  Fasting Blood Sugar- na Checks Blood Sugar_na__ times a day  OSA/CPAP: No  ASA: Last dose 10/11/20 Blood Thinners: No  No covid testing required  Anesthesia Review: yes, [employer] loop recorder.  Recent stroke 08/16/20  Patient denies shortness of breath, fever, cough, and chest pain at PAT appointment.  Patient verbalized understanding of instructions provided today at the PAT appointment.  Patient asked to review instructions at home and day of surgery.

## 2020-10-18 NOTE — Progress Notes (Signed)
Anesthesia Chart Review:  Case: [id number] Date/Time: 10/19/20 0850   Procedure: DENTAL RESTORATION/EXTRACTIONS (Bilateral )   Anesthesia type: General   Pre-op diagnosis: NONRESTORABLE   [facility]: [facility] ROOM 04 / [facility]   Surgeons: Ocie Doyne, DMD      DISCUSSION: Patient is a 65 year old male scheduled for the above procedure.  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   S/p Medtronic LINQII + AccurRhythm AI loop recorder 05/16/19 by Thurmon Fair, MD. Last monitoring period 08/18/20-09/30/20: "ILR summary report received. Battery status OK. Normal device function. No new symptom, tachy, brady, or pause episodes. No new AF episodes. Monthly summary reports and ROV/PRN As of 05/02/20, no arrhythmias documented on interrogation."  Preoperative cardiology input outlined on 08/21/20 by Edd Fabian, NP. "Given past medical history and time since last visit, based on ACC/AHA guidelines, Robert Donovan would be at acceptable risk for the planned procedure without further cardiovascular testing.   His aspirin may be held for 5-7 days prior to his surgery.  Please resume as soon as hemostasis is achieved at the discretion of the surgeon."  Anesthesia team to evaluate on the day of surgery. Labs and EKG as indicated.    VS:  BP Readings from Last 3 Encounters:  10/02/20 116/77  09/06/20 110/68  07/18/20 117/74   Pulse Readings from Last 3 Encounters:  10/02/20 66  09/06/20 79  07/18/20 76    PROVIDERS: [name], MD is PCP   - Delia Heady, MD is neurologist. Last visit 01/09/20 with Ihor Austin, NP with as needed neurology follow-up recommended with ongoing primary care management.  Nanetta Batty, MD is cardiologist. Last visit 05/02/20 with Corine Shelter, PA-C.  - Myra Gianotti, V. Anner Crete, MD is vascular surgeon   LABS: For day of surgery. As of 02/02/20, Cr 1.21, H/H 12.9/39.9, PLT 222.    IMAGES: MRI Brain 02/11/18: IMPRESSION: - Multiple areas of acute infarct in the left anterior cerebral artery and left middle cerebral artery territory suggesting emboli. - Advanced chronic ischemic changes as described above. Multiple areas of chronic microhemorrhage in the brain likely due to poorly controlled hypertension. - If there is continued concern of neuro syphilis, lumbar puncture suggested. (Per 02/18/18 Discharge Summary, ID was consulted for syphilis, reactive RPR. 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   CV: Carotid US 06/06/19: Summary:  - Right Carotid: Velocities in the right ICA are consistent with a 1-39%  stenosis.  - Left Carotid: Velocities in the left ICA are consistent with a 1-39%  stenosis. The ECA appears >50% stenosed.  - Vertebrals: Right vertebral artery demonstrates antegrade flow. Left  vertebral artery demonstrates no discernable flow.  - Subclavians: Normal flow hemodynamics were seen in bilateral subclavian arteries.   TEE 02/16/18: Study Conclusions  - Left ventricle: Systolic function was normal. The estimated  ejection fraction was in the range of 60% to 65%. Wall motion was  normal; there were no regional wall motion abnormalities.  - Left  atrium: No evidence of thrombus in the atrial cavity or  appendage.  - Right atrium: No evidence of thrombus in the atrial cavity or  appendage.  - Atrial septum: No defect or  patent foramen ovale was identified.  Echo contrast study showed no right-to-left atrial level shunt.   Impressions:  - Normal study. No cardiac source of emboli was indentified.    TTE 02/11/18: Impressions:  - Normal LV systolic function.  Grade 1 diastolic dysfunction.  Technically difficult images.    Past Medical History:  Diagnosis Date  . Arthritis   . High cholesterol   . History of loop recorder 05/16/2019  . Hypertension   . Stroke [facility]) 02/10/2018    Past Surgical History:  Procedure Laterality Date  . LOOP RECORDER IMPLANT  05/16/2019  . TEE WITHOUT CARDIOVERSION N/A 02/16/2018   Procedure: TRANSESOPHAGEAL ECHOCARDIOGRAM (TEE);  Surgeon: Jodelle Red, MD;  Location: [facility] ENDOSCOPY;  Service: Cardiovascular;  Laterality: N/A;    MEDICATIONS: No current facility-administered medications for this encounter.   Marland Kitchen amLODipine (NORVASC) 5 MG tablet  . aspirin EC 81 MG tablet  . atorvastatin (LIPITOR) 40 MG tablet  . calcium-vitamin D (OSCAL WITH D) 500-200 MG-UNIT tablet  . lisinopril-hydrochlorothiazide (ZESTORETIC) 20-25 MG tablet  . Multiple Vitamins-Minerals (MENS MULTIVITAMIN PLUS PO)  . naproxen (NAPROSYN) 250 MG tablet  . vitamin B-12 (CYANOCOBALAMIN) 500 MCG tablet    Shonna Chock, PA-C Surgical Short Stay/Anesthesiology [facility] Phone [phone] [facility] Phone [phone] 10/18/2020 2:20 PM

## 2020-10-18 NOTE — Anesthesia Preprocedure Evaluation (Addendum)
Anesthesia Evaluation  Patient identified by MRN, date of birth, ID band Patient awake    Reviewed: Allergy & Precautions, H&P , NPO status , Patient's Chart, lab work & pertinent test results  Airway Mallampati: II   Neck ROM: full    Dental   Pulmonary former smoker,    breath sounds clear to auscultation       Cardiovascular hypertension,  Rhythm:regular Rate:Normal     Neuro/Psych Right hemiparesis CVA    GI/Hepatic   Endo/Other    Renal/GU Renal InsufficiencyRenal disease     Musculoskeletal  (+) Arthritis ,   Abdominal   Peds  Hematology   Anesthesia Other Findings   Reproductive/Obstetrics                            Anesthesia Physical Anesthesia Plan  ASA: III  Anesthesia Plan: General   Post-op Pain Management:    Induction: Intravenous  PONV Risk Score and Plan: 2 and Ondansetron, Dexamethasone and Treatment may vary due to age or medical condition  Airway Management Planned: Nasal ETT  Additional Equipment:   Intra-op Plan:   Post-operative Plan: Extubation in OR  Informed Consent: I have reviewed the patients History and Physical, chart, labs and discussed the procedure including the risks, benefits and alternatives for the proposed anesthesia with the patient or authorized representative who has indicated his/her understanding and acceptance.     Dental advisory given  Plan Discussed with: CRNA, Anesthesiologist and Surgeon  Anesthesia Plan Comments: (PAT note written 10/18/2020 by Shonna Chock, PA-C. )       Anesthesia Quick Evaluation

## 2020-10-19 ENCOUNTER — Encounter (HOSPITAL_COMMUNITY)
Admission: RE | Disposition: A | Discharge status: Home / Self Care | Payer: Self-pay | Source: Home / Self Care | Attending: Oral Surgery

## 2020-10-19 ENCOUNTER — Other Ambulatory Visit: Payer: Self-pay

## 2020-10-19 ENCOUNTER — Encounter (HOSPITAL_COMMUNITY): Payer: Self-pay | Admitting: Oral Surgery

## 2020-10-19 ENCOUNTER — Ambulatory Visit (HOSPITAL_COMMUNITY): Payer: Medicaid Other | Admitting: Physician Assistant

## 2020-10-19 ENCOUNTER — Ambulatory Visit (HOSPITAL_COMMUNITY)
Admission: RE | Admit: 2020-10-19 | Discharge: 2020-10-19 | Disposition: A | Discharge status: Home / Self Care | Payer: Medicaid Other | Attending: Oral Surgery | Admitting: Oral Surgery

## 2020-10-19 DIAGNOSIS — Z791 Long term (current) use of non-steroidal anti-inflammatories (NSAID): Secondary | ICD-10-CM | POA: Diagnosis not present

## 2020-10-19 DIAGNOSIS — Z7982 Long term (current) use of aspirin: Secondary | ICD-10-CM | POA: Diagnosis not present

## 2020-10-19 DIAGNOSIS — Z87891 Personal history of nicotine dependence: Secondary | ICD-10-CM | POA: Insufficient documentation

## 2020-10-19 DIAGNOSIS — I6990 Unspecified sequelae of unspecified cerebrovascular disease: Secondary | ICD-10-CM | POA: Insufficient documentation

## 2020-10-19 DIAGNOSIS — I10 Essential (primary) hypertension: Secondary | ICD-10-CM | POA: Insufficient documentation

## 2020-10-19 DIAGNOSIS — K056 Periodontal disease, unspecified: Secondary | ICD-10-CM | POA: Insufficient documentation

## 2020-10-19 DIAGNOSIS — Z79899 Other long term (current) drug therapy: Secondary | ICD-10-CM | POA: Insufficient documentation

## 2020-10-19 DIAGNOSIS — E7800 Pure hypercholesterolemia, unspecified: Secondary | ICD-10-CM | POA: Insufficient documentation

## 2020-10-19 DIAGNOSIS — K029 Dental caries, unspecified: Secondary | ICD-10-CM | POA: Diagnosis not present

## 2020-10-19 DIAGNOSIS — K0889 Other specified disorders of teeth and supporting structures: Secondary | ICD-10-CM | POA: Diagnosis not present

## 2020-10-19 DIAGNOSIS — K055 Other periodontal diseases: Secondary | ICD-10-CM | POA: Diagnosis not present

## 2020-10-19 HISTORY — PX: TOOTH EXTRACTION: SHX859

## 2020-10-19 LAB — POCT I-STAT, CHEM 8
BUN: 20 mg/dL (ref 8–23)
Calcium, Ion: 1.23 mmol/L (ref 1.15–1.40)
Chloride: 104 mmol/L (ref 98–111)
Creatinine, Ser: 1.1 mg/dL (ref 0.61–1.24)
Glucose, Bld: 95 mg/dL (ref 70–99)
HCT: 40 % (ref 39.0–52.0)
Hemoglobin: 13.6 g/dL (ref 13.0–17.0)
Potassium: 3.7 mmol/L (ref 3.5–5.1)
Sodium: 140 mmol/L (ref 135–145)
TCO2: 28 mmol/L (ref 22–32)

## 2020-10-19 SURGERY — DENTAL RESTORATION/EXTRACTIONS
Anesthesia: General | Laterality: Bilateral

## 2020-10-19 MED ORDER — LIDOCAINE-EPINEPHRINE 2 %-1:100000 IJ SOLN
INTRAMUSCULAR | Status: DC | PRN
  Administered 2020-10-19: 20 mL

## 2020-10-19 MED ORDER — ROCURONIUM BROMIDE 10 MG/ML (PF) SYRINGE
PREFILLED_SYRINGE | INTRAVENOUS | Status: DC | PRN
  Administered 2020-10-19: 70 mg via INTRAVENOUS

## 2020-10-19 MED ORDER — FENTANYL CITRATE (PF) 250 MCG/5ML IJ SOLN
INTRAMUSCULAR | Status: DC | PRN
  Administered 2020-10-19: 50 ug via INTRAVENOUS
  Administered 2020-10-19: 150 ug via INTRAVENOUS
  Administered 2020-10-19 (×3): 50 ug via INTRAVENOUS

## 2020-10-19 MED ORDER — CHLORHEXIDINE GLUCONATE 0.12 % MT SOLN
OROMUCOSAL | Status: AC
  Administered 2020-10-19: 15 mL via OROMUCOSAL
  Filled 2020-10-19: qty 15

## 2020-10-19 MED ORDER — CEFAZOLIN SODIUM-DEXTROSE 2-4 GM/100ML-% IV SOLN
2.0000 g | INTRAVENOUS | Status: AC
  Administered 2020-10-19: 2 g via INTRAVENOUS

## 2020-10-19 MED ORDER — FENTANYL CITRATE (PF) 250 MCG/5ML IJ SOLN
INTRAMUSCULAR | Status: AC
  Filled 2020-10-19: qty 5

## 2020-10-19 MED ORDER — CEFAZOLIN SODIUM-DEXTROSE 2-4 GM/100ML-% IV SOLN
INTRAVENOUS | Status: AC
  Filled 2020-10-19: qty 100

## 2020-10-19 MED ORDER — PROPOFOL 10 MG/ML IV BOLUS
INTRAVENOUS | Status: DC | PRN
  Administered 2020-10-19: 20 mg via INTRAVENOUS
  Administered 2020-10-19: 100 mg via INTRAVENOUS

## 2020-10-19 MED ORDER — FENTANYL CITRATE (PF) 100 MCG/2ML IJ SOLN: 25.0000 ug | INTRAMUSCULAR | Status: DC | PRN

## 2020-10-19 MED ORDER — LACTATED RINGERS IV SOLN: INTRAVENOUS | Status: DC

## 2020-10-19 MED ORDER — OXYMETAZOLINE HCL 0.05 % NA SOLN
NASAL | Status: AC
  Filled 2020-10-19: qty 60

## 2020-10-19 MED ORDER — SUGAMMADEX SODIUM 200 MG/2ML IV SOLN
INTRAVENOUS | Status: DC | PRN
  Administered 2020-10-19 (×2): 100 mg via INTRAVENOUS

## 2020-10-19 MED ORDER — LIDOCAINE-EPINEPHRINE 2 %-1:100000 IJ SOLN
INTRAMUSCULAR | Status: AC
  Filled 2020-10-19: qty 1

## 2020-10-19 MED ORDER — ONDANSETRON HCL 4 MG/2ML IJ SOLN
INTRAMUSCULAR | Status: DC | PRN
  Administered 2020-10-19: 4 mg via INTRAVENOUS

## 2020-10-19 MED ORDER — AMOXICILLIN 500 MG PO CAPS: 500.0000 mg | ORAL_CAPSULE | Freq: Three times a day (TID) | ORAL | 0 refills | Status: DC

## 2020-10-19 MED ORDER — 0.9 % SODIUM CHLORIDE (POUR BTL) OPTIME
TOPICAL | Status: DC | PRN
  Administered 2020-10-19: 1000 mL

## 2020-10-19 MED ORDER — MIDAZOLAM HCL 2 MG/2ML IJ SOLN
INTRAMUSCULAR | Status: AC
  Filled 2020-10-19: qty 2

## 2020-10-19 MED ORDER — SODIUM CHLORIDE 0.9 % IR SOLN
Status: DC | PRN
  Administered 2020-10-19: 1000 mL

## 2020-10-19 MED ORDER — ORAL CARE MOUTH RINSE: 15.0000 mL | Freq: Once | OROMUCOSAL | Status: AC

## 2020-10-19 MED ORDER — MIDAZOLAM HCL 2 MG/2ML IJ SOLN
INTRAMUSCULAR | Status: DC | PRN
  Administered 2020-10-19: 2 mg via INTRAVENOUS

## 2020-10-19 MED ORDER — DEXAMETHASONE SODIUM PHOSPHATE 10 MG/ML IJ SOLN
INTRAMUSCULAR | Status: DC | PRN
  Administered 2020-10-19: 10 mg via INTRAVENOUS

## 2020-10-19 MED ORDER — ONDANSETRON HCL 4 MG/2ML IJ SOLN: 4.0000 mg | Freq: Four times a day (QID) | INTRAMUSCULAR | Status: DC | PRN

## 2020-10-19 MED ORDER — TRAMADOL HCL 50 MG PO TABS: 50.0000 mg | ORAL_TABLET | Freq: Four times a day (QID) | ORAL | 0 refills | Status: DC | PRN

## 2020-10-19 MED ORDER — LIDOCAINE 2% (20 MG/ML) 5 ML SYRINGE
INTRAMUSCULAR | Status: DC | PRN
  Administered 2020-10-19: 100 mg via INTRAVENOUS

## 2020-10-19 MED ORDER — CHLORHEXIDINE GLUCONATE 0.12 % MT SOLN: 15.0000 mL | Freq: Once | OROMUCOSAL | Status: AC

## 2020-10-19 SURGICAL SUPPLY — 36 items
BLADE SURG 15 STRL LF DISP TIS (BLADE) ×1 IMPLANT
BLADE SURG 15 STRL SS (BLADE) ×2
BUR CROSS CUT FISSURE 1.6 (BURR) ×2 IMPLANT
BUR EGG ELITE 4.0 (BURR) ×2 IMPLANT
CANISTER SUCT 3000ML PPV (MISCELLANEOUS) ×2 IMPLANT
COVER SURGICAL LIGHT HANDLE (MISCELLANEOUS) ×2 IMPLANT
COVER WAND RF STERILE (DRAPES) IMPLANT
DECANTER SPIKE VIAL GLASS SM (MISCELLANEOUS) ×2 IMPLANT
DRAPE U-SHAPE 76X120 STRL (DRAPES) ×2 IMPLANT
GAUZE PACKING FOLDED 2  STR (GAUZE/BANDAGES/DRESSINGS) ×2
GAUZE PACKING FOLDED 2 STR (GAUZE/BANDAGES/DRESSINGS) ×1 IMPLANT
GLOVE BIO SURGEON STRL SZ 6.5 (GLOVE) IMPLANT
GLOVE BIO SURGEON STRL SZ7 (GLOVE) IMPLANT
GLOVE BIO SURGEON STRL SZ8 (GLOVE) ×2 IMPLANT
GLOVE SURG UNDER POLY LF SZ6.5 (GLOVE) IMPLANT
GLOVE SURG UNDER POLY LF SZ7 (GLOVE) IMPLANT
GOWN STRL REUS W/ TWL LRG LVL3 (GOWN DISPOSABLE) ×1 IMPLANT
GOWN STRL REUS W/ TWL XL LVL3 (GOWN DISPOSABLE) ×1 IMPLANT
GOWN STRL REUS W/TWL LRG LVL3 (GOWN DISPOSABLE) ×2
GOWN STRL REUS W/TWL XL LVL3 (GOWN DISPOSABLE) ×2
IV NS 1000ML (IV SOLUTION) ×2
IV NS 1000ML BAXH (IV SOLUTION) ×1 IMPLANT
KIT BASIN OR (CUSTOM PROCEDURE TRAY) ×2 IMPLANT
KIT TURNOVER KIT B (KITS) ×2 IMPLANT
NDL HYPO 25GX1X1/2 BEV (NEEDLE) ×2 IMPLANT
NEEDLE HYPO 25GX1X1/2 BEV (NEEDLE) ×4 IMPLANT
NS IRRIG 1000ML POUR BTL (IV SOLUTION) ×2 IMPLANT
PAD ARMBOARD 7.5X6 YLW CONV (MISCELLANEOUS) ×2 IMPLANT
SLEEVE IRRIGATION ELITE 7 (MISCELLANEOUS) ×2 IMPLANT
SPONGE SURGIFOAM ABS GEL 12-7 (HEMOSTASIS) ×1 IMPLANT
SUT CHROMIC 3 0 PS 2 (SUTURE) ×4 IMPLANT
SYR BULB IRRIG 60ML STRL (SYRINGE) ×2 IMPLANT
SYR CONTROL 10ML LL (SYRINGE) ×2 IMPLANT
TRAY ENT MC OR (CUSTOM PROCEDURE TRAY) ×2 IMPLANT
TUBING IRRIGATION (MISCELLANEOUS) ×2 IMPLANT
YANKAUER SUCT BULB TIP NO VENT (SUCTIONS) ×2 IMPLANT

## 2020-10-19 NOTE — Op Note (Signed)
10/19/2020  10:57 AM  PATIENT:  Robert Donovan  65 y.o. male  PRE-OPERATIVE DIAGNOSIS:  NONRESTORABLE TEETH # 1, 3, 6, 7, 8, 10, 11, 15, 18, 19, 20, 21, 22, 23, 24, 27, 28, 29, 31 POST-OPERATIVE DIAGNOSIS:  SAME  PROCEDURE:  Procedure(s)EXTRACTION  TEETH # 1, 3, 6, 7, 8, 10, 11, 15, 18, 19, 20, 21, 22, 23, 24, 27, 28, 29, 31; ALVEOLOPLASTY RIGHT AND LEFT MAXILLA AND MANDIBLE`  SURGEON:  Surgeon(s): Ocie Doyne, DMD  ANESTHESIA:   local and general  EBL:  150CC  DRAINS: none   SPECIMEN:  No Specimen  COUNTS:  YES  PLAN OF CARE: Discharge to home after PACU  PATIENT DISPOSITION:  PACU - hemodynamically stable.   PROCEDURE DETAILS: Dictation #[id number]  Georgia Lopes, DMD 10/19/2020 10:57 AM

## 2020-10-19 NOTE — Progress Notes (Signed)
Patient has some confusion when relaying last time medications were taken. Patient unable to remember the medication and when it was last taken. Patient states he only took Aspirin 81 mg this am but then says he took all medications this morning. When surgeon was present in room patient stated he took stopped Aspirin 7 days ago but took it this morning.  Asked patient's mother who was waiting in the lobby and she is not aware of last meds taken and when. States patient lives alone and takes care of himself and manages his medications. Home health aides are scheduled to come assist the patient three times a week per mother. Dr. Luretha Rued and Dr. Tammy Sours made aware.

## 2020-10-19 NOTE — H&P (Signed)
H&P documentation  -History and Physical Reviewed  -Patient has been re-examined  -No change in the plan of care  Robert Donovan  

## 2020-10-19 NOTE — Progress Notes (Signed)
Patient is alert and oriented, VSS, no pain.  He is awaiting SCAT for transportation home.  His mom is also aware of this plan. Patient no longer needs monitoring at this time.

## 2020-10-19 NOTE — Transfer of Care (Signed)
Immediate Anesthesia Transfer of Care Note  Patient: Robert Donovan  Procedure(s) Performed: DENTAL RESTORATION/EXTRACTIONS (Bilateral )  Patient Location: PACU  Anesthesia Type:General  Level of Consciousness: awake, alert  and oriented  Airway & Oxygen Therapy: Patient Spontanous Breathing and Patient connected to face mask oxygen  Post-op Assessment: Report given to RN and Post -op Vital signs reviewed and stable  Post vital signs: Reviewed and stable  Last Vitals:  Vitals Value Taken Time  BP 170/71 10/19/20 1109  Temp 36.9 C 10/19/20 1108  Pulse 92 10/19/20 1110  Resp 19 10/19/20 1110  SpO2 98 % 10/19/20 1110  Vitals shown include unvalidated device data.  Last Pain:  Vitals:   10/19/20 0845  TempSrc:   PainSc: 0-No pain      Patients Stated Pain Goal: 2 (10/19/20 0845)  Complications: No complications documented.

## 2020-10-19 NOTE — Op Note (Signed)
Robert Donovan, Robert Donovan MEDICAL RECORD NO: [id number] ACCOUNT NO: [id number] DATE OF BIRTH: 1956/05/13 FACILITY: [facility] LOCATION: [facility] PHYSICIAN: Georgia Lopes, DDS  Operative Report   DATE OF PROCEDURE: 10/19/2020  PREOPERATIVE DIAGNOSIS:  Nonrestorable teeth secondary to dental caries and periodontitis numbers 1, 3, 6, 7, 8, 10, 11, 15 18, 19, 20, 21, 22, 23, 24, 27, 28, 29, 31.  POSTOPERATIVE DIAGNOSIS:  Nonrestorable teeth secondary to dental caries and periodontitis numbers 1, 3, 6, 7, 8, 10, 11, 15 18, 19, 20, 21, 22, 23, 24, 27, 28, 29, 31.  PROCEDURES:  Extraction of teeth numbers 1, 3, 6, 7, 8, 10, 11, 15 18, 19, 20, 21, 22, 23, 24, 27, 28, 29, 31 and alveoplasty of right and left maxilla and mandible.  SURGEON:  Georgia Lopes, DDS  ANESTHESIA:  General, Dr. Chaney Malling, attending.  DESCRIPTION OF PROCEDURE:  The patient was taken to the operating room and placed on the table in supine position.  General anesthesia was administered intravenously, and a nasal endotracheal tube was placed atraumatically.  The tube was secured.  The  eyes were protected.  The patient was draped for surgery.  Timeout was performed.  The posterior pharynx was suctioned.  A throat pack was placed.  2% lidocaine, 1:100,000 epinephrine was infiltrated in an inferior alveolar block on the right and left  sides and buccal and palatal infiltration of the maxilla around the teeth to be removed.  A bite block was placed on the right side of the mouth.  A sweetheart retractor was used to retract the tongue.  #15 blade was used to make an incision starting at  tooth #18 in the mandible, carried forward buccally and gingivally and lingually into the gingival sulcus across the midline until tooth #27 was reached.  The overall gingiva was edematous, inflamed, and gingiva was trimmed as the incision was made to  better identify the teeth.  Teeth were grossly mobile and had gross calculus accumulations.  The teeth were  elevated with 301 elevator and easily removed with a dental forceps.  The sockets were curetted.  The tissue was trimmed and debrided and then the  area was reflected to expose the alveolar crest.  There was irregular bone contour owing to the severe periodontal disease.  Alveoplasty was then performed using the egg bur followed by the bone file.  Then, the area was irrigated and closed with 3-0  chromic.  Attention was turned to the left maxilla.  A 15 blade used to make an incision around tooth #15 and carried forward on the edentulous alveolar crest until tooth #11 was encountered.  Then, the incision was created in the buccal and palatal  sulcus of numbers 11, 10, 8, and 7.  Then, the periosteum was reflected.  The tissue was trimmed.  The teeth were elevated and removed with a dental forceps.  The sockets were curetted, the tissue was debrided, and then alveoplasty was performed using  the egg bur and the bone file.  Then, area was irrigated and closed with 3-0 chromic.  The bite block and sweetheart retractor were repositioned to the other side of the mouth.  A 15 blade was used to make an incision around the lower teeth numbers 31,  29, 28 27.  The teeth were elevated.  Tooth number 28 was removed with a dental forceps, but teeth numbers 27, 29, and 31 required removal of bone around roots, which fractured. The roots were retrieved and then the tissue was trimmed.  Sockets were  curetted.  Tissue was reflected to expose the crest of the alveolus, which had multiple undercuts and irregularities.  Alveoplasty was performed using the egg-shaped bur and then the bone file was used to smooth the area, and then, in the right maxilla,  an incision was made around teeth numbers 1, 3, and 6.  The incision was connected between these teeth.  The teeth were elevated and removed.  Number 3 fractured upon attempted removal and required sectioning of the crowns and removal of residual roots  with Stryker handpiece  and a root tip pick.  After the teeth were removed, the sockets were curetted.  The tissue was trimmed and debrided.  Then, the area was closed with 3-0 chromic after alveoplasty using the egg burr and bone file.  The oral cavity  was then irrigated, suctioned, inspected and found to have good contour, hemostasis, and closure.  The throat pack was removed.  The patient was left in the care of anesthesia for extubation and transport to recovery room with plans for discharge home  through day surgery.  ESTIMATED BLOOD LOSS:  150 mL.  SPECIMENS:  None.  COUNTS:  Correct.  DRAINS:  None.   [facility] D: 10/19/2020 11:03:13 am T: 10/19/2020 2:56:00 pm  JOB: [id number]/ [id number]

## 2020-10-19 NOTE — Anesthesia Procedure Notes (Signed)
Procedure Name: Intubation Date/Time: 10/19/2020 9:49 AM Performed by: Rosiland Oz, CRNA Pre-anesthesia Checklist: Patient identified, Emergency Drugs available, Patient being monitored, Suction available and Timeout performed Patient Re-evaluated:Patient Re-evaluated prior to induction Oxygen Delivery Method: Circle system utilized Preoxygenation: Pre-oxygenation with 100% oxygen Induction Type: IV induction Ventilation: Mask ventilation without difficulty Laryngoscope Size: Miller and 3 Grade View: Grade I Nasal Tubes: Left, Nasal prep performed and Nasal Rae Tube size: 7.0 mm Number of attempts: 1 Airway Equipment and Method: Stylet Placement Confirmation: ETT inserted through vocal cords under direct vision,  positive ETCO2 and breath sounds checked- equal and bilateral Tube secured with: Tape Dental Injury: Teeth and Oropharynx as per pre-operative assessment

## 2020-10-19 NOTE — Anesthesia Postprocedure Evaluation (Signed)
Anesthesia Post Note  Patient: Robert Donovan  Procedure(s) Performed: DENTAL RESTORATION/EXTRACTIONS (Bilateral )     Patient location during evaluation: PACU Anesthesia Type: General Level of consciousness: awake and alert Pain management: pain level controlled Vital Signs Assessment: post-procedure vital signs reviewed and stable Respiratory status: spontaneous breathing, nonlabored ventilation, respiratory function stable and patient connected to nasal cannula oxygen Cardiovascular status: blood pressure returned to baseline and stable Postop Assessment: no apparent nausea or vomiting Anesthetic complications: no   No complications documented.  Last Vitals:  Vitals:   10/19/20 1130 10/19/20 1138  BP: (!) 146/72 (!) 150/66  Pulse: 84 81  Resp: (!) 22 20  Temp:  36.9 C  SpO2: 98% 99%    Last Pain:  Vitals:   10/19/20 1138  TempSrc:   PainSc: 0-No pain                 Carleton [place] S

## 2020-10-20 ENCOUNTER — Encounter (HOSPITAL_COMMUNITY): Payer: Self-pay | Admitting: Oral Surgery

## 2020-10-22 ENCOUNTER — Other Ambulatory Visit: Payer: Self-pay | Admitting: *Deleted

## 2020-10-22 ENCOUNTER — Other Ambulatory Visit: Payer: Self-pay

## 2020-10-22 NOTE — Patient Instructions (Signed)
Visit Information  Robert Donovan was given information about Medicaid Managed Care team care coordination services as a part of their [facility] Community Plan Medicaid benefit. Robert Donovan verbally consented to engagement with the [facility] Managed Care team.   For questions related to your [facility], please call: [phone] or visit the homepage here: [email or web address]  If you would like to schedule transportation through your [facility], please call the following number at least 2 days in advance of your appointment: [phone].   Call the Behavioral Health Crisis Line at [phone], at any time, 24 hours a day, 7 days a week. If you are in danger or need immediate medical attention call 911.  Robert Donovan - following are the goals we discussed in your visit today:  Goals Addressed            This Visit's Progress   . Find Help in My Community       Timeframe:  Long-Range Goal Priority:  High Start Date:   10/22/20                          Expected End Date:  01/25/21                    Follow Up Date 11/26/20   - Call [facility] [phone]) to schedule an appointment for an eye exam - begin a notebook of services in my neighborhood or community - follow-up on any referrals for help I am given    Why is this important?    Knowing how and where to find help for yourself or family in your neighborhood and community is an important skill.   You will want to take some steps to learn how.        . Make and Keep All Appointments       Timeframe:  Long-Range Goal Priority:  High Start Date:   10/22/20                          Expected End Date: 01/25/21                      Follow Up Date 11/26/20  - attend all scheduled appointments, call to schedule follow up with PCP - arrange a ride through an agency 1 week before appointment - call to cancel if needed - keep  a calendar with prescription refill dates - keep a calendar with appointment dates  - Contact provider office with any questions or concerns   Why is this important?    Part of staying healthy is seeing the doctor for follow-up care.   If you forget your appointments, there are some things you can do to stay on track.           Please see education materials related to nutrition provided by MyChart link. and as Financial risk analyst.     Managing Your Hypertension Hypertension, also called high blood pressure, is when the force of the blood pressing against the walls of the arteries is too strong. Arteries are blood vessels that carry blood from your heart throughout your body. Hypertension forces the heart to work harder to pump blood and may cause the arteries to become narrow or stiff. Understanding blood pressure readings Your personal target blood pressure may vary depending on your medical conditions, your age, and other factors. A  blood pressure reading includes a higher number over a lower number. Ideally, your blood pressure should be below 120/80. You should know that:  The first, or top, number is called the systolic pressure. It is a measure of the pressure in your arteries as your heart beats.  The second, or bottom number, is called the diastolic pressure. It is a measure of the pressure in your arteries as the heart relaxes. Blood pressure is classified into four stages. Based on your blood pressure reading, your health care provider may use the following stages to determine what type of treatment you need, if any. Systolic pressure and diastolic pressure are measured in a unit called mmHg. Normal  Systolic pressure: below 120.  Diastolic pressure: below 80. Elevated  Systolic pressure: 120-129.  Diastolic pressure: below 80. Hypertension stage 1  Systolic pressure: 130-139.  Diastolic pressure: 80-89. Hypertension stage 2  Systolic pressure: 140 or  above.  Diastolic pressure: 90 or above. How can this condition affect me? Managing your hypertension is an important responsibility. Over time, hypertension can damage the arteries and decrease blood flow to important parts of the body, including the brain, heart, and kidneys. Having untreated or uncontrolled hypertension can lead to:  A heart attack.  A stroke.  A weakened blood vessel (aneurysm).  Heart failure.  Kidney damage.  Eye damage.  Metabolic syndrome.  Memory and concentration problems.  Vascular dementia. What actions can I take to manage this condition? Hypertension can be managed by making lifestyle changes and possibly by taking medicines. Your health care provider will help you make a plan to bring your blood pressure within a normal range. Nutrition  Eat a diet that is high in fiber and potassium, and low in salt (sodium), added sugar, and fat. An example eating plan is called the Dietary Approaches to Stop Hypertension (DASH) diet. To eat this way: ? Eat plenty of fresh fruits and vegetables. Try to fill one-half of your plate at each meal with fruits and vegetables. ? Eat whole grains, such as whole-wheat pasta, brown rice, or whole-grain bread. Fill about one-fourth of your plate with whole grains. ? Eat low-fat dairy products. ? Avoid fatty cuts of meat, processed or cured meats, and poultry with skin. Fill about one-fourth of your plate with lean proteins such as fish, chicken without skin, beans, eggs, and tofu. ? Avoid pre-made and processed foods. These tend to be higher in sodium, added sugar, and fat.  Reduce your daily sodium intake. Most people with hypertension should eat less than 1,500 mg of sodium a day.   Lifestyle  Work with your health care provider to maintain a healthy body weight or to lose weight. Ask what an ideal weight is for you.  Get at least 30 minutes of exercise that causes your heart to beat faster (aerobic exercise) most days  of the week. Activities may include walking, swimming, or biking.  Include exercise to strengthen your muscles (resistance exercise), such as weight lifting, as part of your weekly exercise routine. Try to do these types of exercises for 30 minutes at least 3 days a week.  Do not use any products that contain nicotine or tobacco, such as cigarettes, e-cigarettes, and chewing tobacco. If you need help quitting, ask your health care provider.  Control any long-term (chronic) conditions you have, such as high cholesterol or diabetes.  Identify your sources of stress and find ways to manage stress. This may include meditation, deep breathing, or making time for fun  activities.   Alcohol use  Do not drink alcohol if: ? Your health care provider tells you not to drink. ? You are pregnant, may be pregnant, or are planning to become pregnant.  If you drink alcohol: ? Limit how much you use to:  0-1 drink a day for women.  0-2 drinks a day for men. ? Be aware of how much alcohol is in your drink. In the [place] one drink equals one 12 oz bottle of beer (355 mL), one 5 oz glass of wine (148 mL), or one 1 oz glass of hard liquor (44 mL). Medicines Your health care provider may prescribe medicine if lifestyle changes are not enough to get your blood pressure under control and if:  Your systolic blood pressure is 130 or higher.  Your diastolic blood pressure is 80 or higher. Take medicines only as told by your health care provider. Follow the directions carefully. Blood pressure medicines must be taken as told by your health care provider. The medicine does not work as well when you skip doses. Skipping doses also puts you at risk for problems. Monitoring Before you monitor your blood pressure:  Do not smoke, drink caffeinated beverages, or exercise within 30 minutes before taking a measurement.  Use the bathroom and empty your bladder (urinate).  Sit quietly for at least 5 minutes before  taking measurements. Monitor your blood pressure at home as told by your health care provider. To do this:  Sit with your back straight and supported.  Place your feet flat on the floor. Do not cross your legs.  Support your arm on a flat surface, such as a table. Make sure your upper arm is at heart level.  Each time you measure, take two or three readings one minute apart and record the results. You may also need to have your blood pressure checked regularly by your health care provider.   General information  Talk with your health care provider about your diet, exercise habits, and other lifestyle factors that may be contributing to hypertension.  Review all the medicines you take with your health care provider because there may be side effects or interactions.  Keep all visits as told by your health care provider. Your health care provider can help you create and adjust your plan for managing your high blood pressure. Where to find more information  National Heart, Lung, and Blood Institute: PopSteam.is  American Heart Association: www.heart.org Contact a health care provider if:  You think you are having a reaction to medicines you have taken.  You have repeated (recurrent) headaches.  You feel dizzy.  You have swelling in your ankles.  You have trouble with your vision. Get help right away if:  You develop a severe headache or confusion.  You have unusual weakness or numbness, or you feel faint.  You have severe pain in your chest or abdomen.  You vomit repeatedly.  You have trouble breathing. These symptoms may represent a serious problem that is an emergency. Do not wait to see if the symptoms will go away. Get medical help right away. Call your local emergency services (911 in the [place] Do not drive yourself to the hospital. Summary  Hypertension is when the force of blood pumping through your arteries is too strong. If this condition is not controlled,  it may put you at risk for serious complications.  Your personal target blood pressure may vary depending on your medical conditions, your age, and other factors. For most  people, a normal blood pressure is less than 120/80.  Hypertension is managed by lifestyle changes, medicines, or both.  Lifestyle changes to help manage hypertension include losing weight, eating a healthy, low-sodium diet, exercising more, stopping smoking, and limiting alcohol. This information is not intended to replace advice given to you by your health care provider. Make sure you discuss any questions you have with your health care provider. Document Revised: 06/24/2019 Document Reviewed: 04/19/2019 Elsevier Patient Education  2021 Elsevier Inc.     Soft-Food Eating Plan A soft-food eating plan includes foods that are safe and easy to chew and swallow. Your health care provider or dietitian can help you find foods and flavors that fit into this plan. Follow this plan until your health care provider or dietitian says it is safe to start eating other foods and food textures. What are tips for following this plan? General guidelines  Take small bites of food, or cut food into pieces about  inch or smaller. Bite-sized pieces of food are easier to chew and swallow.  Eat moist foods. Avoid overly dry foods.  Avoid foods that: ? Are difficult to swallow, such as dry, chunky, crispy, or sticky foods. ? Are difficult to chew, such as hard, tough, or stringy foods. ? Contain nuts, seeds, or fruits.  Follow instructions from your dietitian about the types of liquids that are safe for you to swallow. You may be allowed to have: ? Thick liquids only. This includes only liquids that are thicker than honey. ? Thin and thick liquids. This includes all beverages and foods that become liquid at room temperature.  To make thick liquids: ? Purchase a commercial liquid thickening powder. These are available at grocery stores  and pharmacies. ? Mix the thickener into liquids according to instructions on the label. ? Purchase ready-made thickened liquids. ? Thicken soup by pureeing, straining to remove chunks, and adding flour, potato flakes, or corn starch. ? Add commercial thickener to foods that become liquid at room temperature, such as milk shakes, yogurt, ice cream, gelatin, and sherbet.  Ask your health care provider whether you need to take a fiber supplement.   Cooking  Cook meats so they stay tender and moist. Use methods like braising, stewing, or baking in liquid.  Cook vegetables and fruit until they are soft enough to be mashed with a fork.  Peel soft, fresh fruits such as peaches, nectarines, and melons.  When making soup, make sure chunks of meat and vegetables are smaller than  inch.  Reheat leftover foods slowly so that a tough crust does not form. What foods are allowed? The items listed below may not be a complete list. Talk with your dietitian about what dietary choices are best for you. Grains Breads, muffins, pancakes, or waffles moistened with syrup, jelly, or butter. Dry cereals well-moistened with milk. Moist, cooked cereals. Well-cooked pasta and rice. Vegetables All soft-cooked vegetables. Shredded lettuce. Fruits All canned and cooked fruits. Soft, peeled fresh fruits. Strawberries. Dairy Milk. Cream. Yogurt. Cottage cheese. Soft cheese without the rind. Meats and other protein foods Tender, moist ground meat, poultry, or fish. Meat cooked in gravy or sauces. Eggs. Sweets and desserts Ice cream. Milk shakes. Sherbet. Pudding. Fats and oils Butter. Margarine. Olive, canola, sunflower, and grapeseed oil. Smooth salad dressing. Smooth cream cheese. Mayonnaise. Gravy. What foods are not allowed? The items listed bemay not be a complete list. Talk with your dietitian about what dietary choices are best for you. Grains Coarse or dry  cereals, such as bran, granola, and shredded  wheat. Tough or chewy crusty breads, such as [place] bread or baguettes. Breads with nuts, seeds, or fruit. Vegetables All raw vegetables. Cooked corn. Cooked vegetables that are tough or stringy. Tough, crisp, fried potatoes and potato skins. Fruits Fresh fruits with skins or seeds, or both, such as apples, pears, and grapes. Stringy, high-pulp fruits, such as papaya, pineapple, coconut, and mango. Fruit leather and all dried fruit. Dairy Yogurt with nuts or coconut. Meats and other protein foods Hard, dry sausages. Dry meat, poultry, or fish. Meats with gristle. Fish with bones. Fried meat or fish. Lunch meat and hotdogs. Nuts and seeds. Chunky peanut butter or other nut butters. Sweets and desserts Cakes or cookies that are very dry or chewy. Desserts with dried fruit, nuts, or coconut. Fried pastries. Very rich pastries. Fats and oils Cream cheese with fruit or nuts. Salad dressings with seeds or chunks. Summary  A soft-food eating plan includes foods that are safe and easy to swallow. Generally, the foods should be soft enough to be mashed with a fork.  Avoid foods that are dry, hard to chew, crunchy, sticky, stringy, or crispy.  Ask your health care provider whether you need to thicken your liquids and if you need to take a fiber supplement. This information is not intended to replace advice given to you by your health care provider. Make sure you discuss any questions you have with your health care provider. Document Revised: 09/09/2018 Document Reviewed: 07/22/2016 Elsevier Patient Education  2021 [employer].   The patient verbalized understanding of instructions provided today and agreed to receive a mailed copy of patient instruction and/or educational materials.  Telephone follow up appointment with Managed Medicaid care management team member scheduled for:11/26/20 @ 2:30pm  Estanislado Emms RN, BSN [place]  [facility] RN Care Coordinator   Following is a  copy of your plan of care:  Patient Care Plan: General Plan of Care (Adult)    Problem Identified: Health Promotion or Disease Self-Management (General Plan of Care)     Long-Range Goal: Self-Management Plan Developed   Start Date: 10/22/2020  Expected End Date: 01/25/2021  This Visit's Progress: On track  Priority: High  Note:   Current Barriers:   Ineffective Self Health Maintenance-RNCM was unable to reach Robert Donovan, spoke with patient's DPR Robert Donovan. Robert Donovan helps Robert Donovan manage his healthcare at home. 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 He fell while using a walker and broke his foot.  His short term memory is declining and he forgets to take his medications and gets confused as to what he is suppose to take and when. He has an aide that comes in 3 times a week to assist with bathing and dressing. Robert Donovan is in need of an eye exam. Robert Donovan has questions regarding the patients BP medications and dosage, progress of referral for motorized wheelchair. Robert Donovan had all of his teeth pulled last week and will be getting dentures in the future.  Unable to self administer medications as prescribed  Unable to perform ADLs independently  Does not contact provider office for questions/concerns  Currently UNABLE TO independently self manage needs related to chronic health conditions.   Knowledge Deficits related to short term plan for care coordination needs and long term plans for chronic disease management needs Nurse Case Manager Clinical Goal(s):   patient will work with care management team to address care  coordination and chronic disease management needs related to Disease Management   Interventions:   Evaluation of current treatment plan related to HTN and mobility and patient's adherence to plan as established by provider.  Advised patient/DPR to contact PCP provider with any questions and concerns  Reviewed medications with patient's  DPR and discussed the importance of taking as directed. Encouraged to write down any questions to discuss with Robert Donovan tomorrow  Discussed plans with patient/DPR for ongoing care management follow up and provided patient with direct contact information for care management team  Advised patient/DPR, providing education and rationale, to monitor blood pressure daily and record, calling PCP for findings outside established parameters.   Provided patient/ and/or caregiver with contact information about [facility] [phone]   Reviewed scheduled/upcoming provider appointments including: [facility] Pharmacist 10/23/20 and 10/25/20 with Robert Donovan, BSW  Discussed the importance of nutrition for healing  Collaborated with PCP office regarding update on referral for motorized wheelchair Self Care Activities:  . Patient will self administer medications as prescribed . Patient will attend all scheduled provider appointments . Patient will attend church or other social activities . Patient will call provider office for new concerns or questions . Patient will work with BSW to address care coordination needs and will continue to work with the clinical team to address health care and disease management related needs.   . Patient will work with [facility] Pharmacist, Robert Donovan for medication management Patient Goals: - Call [facility] [phone]) to schedule an appointment for an eye exam - begin a notebook of services in my neighborhood or community - follow-up on any referrals for help I am given  - attend all scheduled appointments, call to schedule follow up with PCP - arrange a ride through an agency 1 week before appointment - call to cancel if needed - keep a calendar with prescription refill dates - keep a calendar with appointment dates  - Contact provider office with any questions or concerns Follow Up Plan: Telephone follow up appointment with care management team member scheduled for:11/26/20 @ 2:30pm

## 2020-10-23 ENCOUNTER — Other Ambulatory Visit: Payer: Self-pay

## 2020-10-23 NOTE — Progress Notes (Signed)
Carelink Summary Report / Loop Recorder 

## 2020-10-23 NOTE — Patient Outreach (Signed)
Medicaid Managed Care    Pharmacy Note  10/23/2020 Name: Robert Donovan MRN: [id number] DOB: November 30, 1955  Robert Donovan is a 65 y.o. year old male who is a primary care patient of [name], MD. The [facility] Managed Care Coordination team was consulted for assistance with disease management and care coordination needs.    Engaged with patient Engaged with patient by telephone for initial visit in response to referral for case management and/or care coordination services.  [name] was given information about Managed Medicaid Care Coordination team services today. [name] agreed to services and verbal consent obtained.   Objective:  Lab Results  Component Value Date   CREATININE 1.10 10/19/2020   CREATININE 1.21 02/02/2020   CREATININE 1.17 08/05/2019    Lab Results  Component Value Date   HGBA1C 4.9 03/17/2019       Component Value Date/Time   CHOL 123 02/02/2020 1535   TRIG 57 02/02/2020 1535   HDL 37 (L) 02/02/2020 1535   CHOLHDL 3.3 02/02/2020 1535   CHOLHDL 4.3 02/11/2018 0345   VLDL 16 02/11/2018 0345   LDLCALC 73 02/02/2020 1535    Other: (TSH, CBC, Vit D, etc.)  Clinical ASCVD: Yes  The ASCVD Risk score Robert Donovan DC Jr., et al., 2013) failed to calculate for the following reasons:   The patient has a prior MI or stroke diagnosis    Other: (CHADS2VASc if Afib, PHQ9 if depression, MMRC or CAT for COPD, ACT, DEXA)  BP Readings from Last 3 Encounters:  10/19/20 (!) 150/66  10/02/20 116/77  09/06/20 110/68    Assessment/Interventions: Review of patient past medical history, allergies, medications, health status, including review of consultants reports, laboratory and other test data, was performed as part of comprehensive evaluation and provision of chronic care management services.    Stroke: RT side weakness -Using power wheelchair -ASA 81mg  Plan: At goal,  patient stable/ symptoms controlled   Lipids -Atorvastatin 40mg  Plan: At goal,   patient stable/ symptoms controlled   HTN -110/6/, 115/70 -Lisinopril-HCTZ 20-25 take 2QD -Amlodipine 5mg  Plan: Would like new BP machine with larger cuff, will ask PCP. Went into great detail of why patient is on 3 BP meds. She said, "Why is he on 3 if they all do the same thing." Will also ask PCP about switching HCTZ BID to QD  Meds: -Spoke with mother who was in the car (Not driving), didn't have meds on her so couldn't go over timing of meds nor onboarding. BUT, she stated she'd love to have his meds packaged and delivered on 1 day/month. Plan: Onboard next month, Verbal consent obtained for [facility] enhanced pharmacy services (medication synchronization, adherence packaging, delivery coordination). A medication sync plan was created to allow patient to get all medications delivered once every 30 to 90 days per patient preference. Patient understands they have freedom to choose pharmacy and clinical pharmacist will coordinate care between all prescribers and [facility].    SDOH (Social Determinants of Health) assessments and interventions performed:    Care Plan  Allergies  Allergen Reactions  . Hydrocodone     Pt stated, "I am not allergic, I do not want to take this medicine ever" Codeine - "I do not want to ever take this medicine - not allergic"  . Oxycodone     Pt stated, "I am not allergic, I do not want to take this medicine ever"    Medications Reviewed Today    Reviewed by , RN (Registered  Nurse) on 10/22/20 at 1544  Med List Status: <None>  Medication Order Taking? Sig Documenting Provider Last Dose Status Informant  amLODipine (NORVASC) 5 MG tablet [id number] Yes TAKE 1 TABLET(5 MG) BY MOUTH DAILY FOR BLOOD PRESSURE  Patient taking differently: Take 5 mg by mouth daily.   [name], MD Taking Active Self  amoxicillin (AMOXIL) 500 MG capsule [id number] Yes Take 1 capsule (500 mg total) by mouth 3 (three) times daily. Ocie Doyne, DMD Taking Active   aspirin EC 81 MG tablet [id number] Yes Take 1 tablet (81 mg total) by mouth daily. Anders Simmonds, PA-C Taking Active Self  atorvastatin (LIPITOR) 40 MG tablet [id number] Yes TAKE 1 TABLET BY MOUTH IN THE EVENING  Patient taking differently: Take 40 mg by mouth every evening.   [name], MD Taking Active Self  calcium-vitamin D [name] WITH D) 500-200 MG-UNIT tablet [id number] Yes Take 1 tablet by mouth daily. [provider] Taking Active Self  lisinopril-hydrochlorothiazide (ZESTORETIC) 20-25 MG tablet [id number] Yes TAKE 2 TABLETS BY MOUTH DAILY FOR BLOOD PRESSURE  Patient taking differently: Take 1 tablet by mouth 2 (two) times daily. FOR BLOOD PRESSURE   Anders Simmonds, PA-C Taking Active Self  Multiple Vitamins-Minerals (MENS MULTIVITAMIN PLUS PO) [id number] Yes Take 1 tablet by mouth daily. [provider] Taking Active Self  naproxen (NAPROSYN) 250 MG tablet [id number] Yes Take 500 mg by mouth 2 (two) times daily with a meal. [provider] Taking Active Self  traMADol (ULTRAM) 50 MG tablet [id number] Yes Take 1 tablet (50 mg total) by mouth every 6 (six) hours as needed. Ocie Doyne, DMD Taking Active            Med Note Ardelia Mems, MELANIE A   Mon Oct 22, 2020  3:44 PM) Took one time, has not needed since  vitamin B-12 (CYANOCOBALAMIN) 500 MCG tablet [id number] Yes Take 500 mcg by mouth daily. [provider] Taking Active Self          Patient Active Problem List   Diagnosis Date Noted  . Hemiparesis affecting right side as late effect of cerebrovascular accident (CVA) (HCC) 09/06/2020  . Personal history of fall 09/06/2020  . Chronic right shoulder pain 09/06/2020  . Closed bimalleolar fracture of right ankle 07/27/2020  . History of loop recorder 05/02/2020  . Carotid artery disease (HCC) 05/02/2020  . Right hemiparesis (HCC) 04/30/2020  . Obesity (BMI 35.0-39.9 without comorbidity) 04/30/2020  .  Hyperlipidemia 04/26/2019  . Hypokalemia   . Acute embolic stroke (HCC)   . History of CVA (cerebrovascular accident) 02/11/2018  . Acute kidney injury superimposed on chronic kidney disease (HCC) 02/11/2018  . History of syphilis 02/11/2018  . Constipation 02/11/2018  . Osteoarthritis 02/11/2018  . Benign essential HTN 02/11/2018  . Acute encephalopathy 02/10/2018    Conditions to be addressed/monitored: HTN and Hypertriglyceridemia  Care Plan : Medication Management  Updates made by Zettie Pho, RPH since 10/23/2020 12:00 AM    Problem: Health Promotion or Disease Self-Management (General Plan of Care)     Goal: Medication Management   Note:   Current Barriers:  . Unable to self administer medications as prescribed . Does not adhere to prescribed medication regimen . Does not maintain contact with provider office . Does not contact provider office for questions/concerns .   Pharmacist Clinical Goal(s):  Marland Kitchen Over the next 30 days, patient will contact provider office for questions/concerns as evidenced notation of same in electronic health record through  collaboration with PharmD and provider.  .   Interventions: . Inter-disciplinary care team collaboration (see longitudinal plan of care) . Comprehensive medication review performed; medication list updated in electronic medical record  @RXCPHYPERTENSION @ @RXCPHYPERLIPIDEMIA @  Patient Goals/Self-Care Activities . Over the next 30 days, patient will:  - collaborate with provider on medication access solutions  Follow Up Plan: The care management team will reach out to the patient again over the next 30 days.     Task: Mutually Develop and Achievement of Patient Goals   Note:   Care Management Activities:    - verbalization of feelings encouraged    Notes:      Medication Assistance: Would like meds packaged, f/u 1 month to set up  Follow up: Agree   Plan: The care management team will reach out to  the patient again over the next 30 days.   , Pharm.D., Managed Medicaid Pharmacist - [phone]

## 2020-10-23 NOTE — Patient Instructions (Signed)
Visit Information  Robert Donovan was given information about Medicaid Managed Care team care coordination services as a part of their [facility] Community Plan Medicaid benefit. Robert Donovan verbally consented to engagement with the [facility] Managed Care team.   For questions related to your [facility], please call: [phone] or visit the homepage here: [email or web address]  If you would like to schedule transportation through your [facility], please call the following number at least 2 days in advance of your appointment: [phone].   Call the Behavioral Health Crisis Line at [phone], at any time, 24 hours a day, 7 days a week. If you are in danger or need immediate medical attention call 911.  Robert Donovan - following are the goals we discussed in your visit today:  Goals Addressed            This Visit's Progress   . Manage My Medicine       Timeframe:  Short-Term Goal Priority:  High Start Date:                             Expected End Date:                       Follow Up Date Monthly     - call for medicine refill 2 or 3 days before it runs out - keep a list of all the medicines I take; vitamins and herbals too - learn to read medicine labels    Why is this important?   . These steps will help you keep on track with your medicines.   Notes:        Please see education materials related to HTN provided as print materials.   Patient verbalizes understanding of instructions provided today.   The Managed Medicaid care management team will reach out to the patient again over the next 30 days.   Robert Donovan, Pharm.D., Managed Medicaid Pharmacist [phone]   Following is a copy of your plan of care:  Patient Care Plan: General Plan of Care (Adult)    Problem Identified: Health Promotion or Disease Self-Management (General Plan of Care)      Long-Range Goal: Self-Management Plan Developed   Start Date: 10/22/2020  Expected End Date: 01/25/2021  This Visit's Progress: On track  Priority: High  Note:   Current Barriers:   Ineffective Self Health Maintenance-RNCM was unable to reach Robert Donovan, spoke with patient's DPR Robert Donovan. Robert Donovan helps Robert Donovan manage his healthcare at home. 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 He fell while using a walker and broke his foot.  His short term memory is declining and he forgets to take his medications and gets confused as to what he is suppose to take and when. He has an aide that comes in 3 times a week to assist with bathing and dressing. Robert Donovan is in need of an eye exam. Robert Donovan has questions regarding the patients BP medications and dosage, progress of referral for motorized wheelchair. Robert Donovan had all of his teeth pulled last week and will be getting dentures in the future.  Unable to self administer medications as prescribed  Unable to perform ADLs independently  Does not contact provider office for questions/concerns  Currently UNABLE TO independently self manage needs related to chronic health conditions.   Knowledge Deficits related to  short term plan for care coordination needs and long term plans for chronic disease management needs Nurse Case Manager Clinical Goal(s):   patient will work with care management team to address care coordination and chronic disease management needs related to Disease Management   Interventions:   Evaluation of current treatment plan related to HTN and mobility and patient's adherence to plan as established by provider.  Advised patient/DPR to contact PCP provider with any questions and concerns  Reviewed medications with patient's DPR and discussed the importance of taking as directed. Encouraged to write down any questions to discuss with Robert Donovan tomorrow  Discussed plans with patient/DPR for  ongoing care management follow up and provided patient with direct contact information for care management team  Advised patient/DPR, providing education and rationale, to monitor blood pressure daily and record, calling PCP for findings outside established parameters.   Provided patient/ and/or caregiver with contact information about [facility] [phone]   Reviewed scheduled/upcoming provider appointments including: [facility] Pharmacist 10/23/20 and 10/25/20 with Robert Donovan, Robert Donovan  Discussed the importance of nutrition for healing  Collaborated with PCP office regarding update on referral for motorized wheelchair Self Care Activities:  . Patient will self administer medications as prescribed . Patient will attend all scheduled provider appointments . Patient will attend church or other social activities . Patient will call provider office for new concerns or questions . Patient will work with Robert Donovan to address care coordination needs and will continue to work with the clinical team to address health care and disease management related needs.   . Patient will work with [facility] Pharmacist, Robert Donovan for medication management Patient Goals: - Call [facility] [phone]) to schedule an appointment for an eye exam - begin a notebook of services in my neighborhood or community - follow-up on any referrals for help I am given  - attend all scheduled appointments, call to schedule follow up with PCP - arrange a ride through an agency 1 week before appointment - call to cancel if needed - keep a calendar with prescription refill dates - keep a calendar with appointment dates  - Contact provider office with any questions or concerns Follow Up Plan: Telephone follow up appointment with care management team member scheduled for:11/26/20 @ 2:30pm    Patient Care Plan: Medication Management    Problem Identified: Health Promotion or Disease Self-Management (General Plan of Care)     Goal: Medication Management   Note:    Current Barriers:  . Unable to self administer medications as prescribed . Does not adhere to prescribed medication regimen . Does not maintain contact with provider office . Does not contact provider office for questions/concerns .   Pharmacist Clinical Goal(s):  Marland Kitchen Over the next 30 days, patient will contact provider office for questions/concerns as evidenced notation of same in electronic health record through collaboration with PharmD and provider.  .   Interventions: . Inter-disciplinary care team collaboration (see longitudinal plan of care) . Comprehensive medication review performed; medication list updated in electronic medical record  @RXCPHYPERTENSION @ @RXCPHYPERLIPIDEMIA @  Patient Goals/Self-Care Activities . Over the next 30 days, patient will:  - collaborate with provider on medication access solutions  Follow Up Plan: The care management team will reach out to the patient again over the next 30 days.     Task: Mutually Develop and Achievement of Patient Goals   Note:   Care Management Activities:    - verbalization of feelings encouraged    Notes:

## 2020-10-24 ENCOUNTER — Telehealth: Payer: Self-pay | Admitting: Internal Medicine

## 2020-10-24 DIAGNOSIS — I10 Essential (primary) hypertension: Secondary | ICD-10-CM

## 2020-10-24 NOTE — Telephone Encounter (Signed)
-----   Message from Zettie Pho, [facility] sent at 10/23/2020  3:29 PM EDT ----- Regarding: Medication Question Afternoon!   Sorry to bother you. Patient was wondering if they could get a script written for BP Cuff LARGE. The one they have doesn't fit. Could you please send a new script to [facility]?

## 2020-10-24 NOTE — Telephone Encounter (Signed)
Rx will be faxed to [facility] once provider signs it

## 2020-10-25 ENCOUNTER — Other Ambulatory Visit: Payer: Self-pay

## 2020-10-25 NOTE — Patient Outreach (Signed)
Medicaid Managed Care Social Work Note  10/25/2020 Name:  Robert Donovan MRN:  [id number] DOB:  01/22/1956  Robert Donovan is an 65 y.o. year old male who is a primary patient of [name], MD.  The [facility] Managed Care Coordination team was consulted for assistance with:  blood pressure cuff and pcs  Robert Donovan was given information about Medicaid Managed CareCoordination services today. Robert Donovan agreed to services and verbal consent obtained.  Engaged with patient  for by telephone forfollow up visit in response to referral for case management and/or care coordination services.   Assessments/Interventions:  Review of past medical history, allergies, medications, health status, including review of consultants reports, laboratory and other test data, was performed as part of comprehensive evaluation and provision of chronic care management services.  SDOH: (Social Determinant of Health) assessments and interventions performed:  . BSW spoke with patients mother, she states that they have not receieved a blood pressure cuff yet, BSW informed mom that a request for a new script had been placed for a large blood pressure cuff to [facility]. BSW asked about PCS, she stated they patient has a new aide named Robert Donovan, she cannot remember the name of the company she works for. She states patient informed Robert Donovan not to come because he just had all of his teeth removed and had a lot of bleeding, he did not want the aide to come in while he was like that. Mom states once patient is better she is going to contact [facility].  . No other resources needed at this time.    Advanced Directives Status:  Not addressed in this encounter.  Care Plan                 Allergies  Allergen Reactions  . Hydrocodone     Pt stated, "I am not allergic, I do not want to take this medicine ever" Codeine - "I do not want to ever take this medicine - not allergic"  . Oxycodone     Pt stated, "I am not  allergic, I do not want to take this medicine ever"    Medications Reviewed Today    Reviewed by [name], RN (Registered Nurse) on 10/22/20 at 1544  Med List Status: <None>  Medication Order Taking? Sig Documenting Provider Last Dose Status Informant  amLODipine (NORVASC) 5 MG tablet [id number] Yes TAKE 1 TABLET(5 MG) BY MOUTH DAILY FOR BLOOD PRESSURE  Patient taking differently: Take 5 mg by mouth daily.   [name], MD Taking Active Self  amoxicillin (AMOXIL) 500 MG capsule [id number] Yes Take 1 capsule (500 mg total) by mouth 3 (three) times daily. Ocie Doyne, DMD Taking Active   aspirin EC 81 MG tablet [id number] Yes Take 1 tablet (81 mg total) by mouth daily. Anders Simmonds, PA-C Taking Active Self  atorvastatin (LIPITOR) 40 MG tablet [id number] Yes TAKE 1 TABLET BY MOUTH IN THE EVENING  Patient taking differently: Take 40 mg by mouth every evening.   [name], MD Taking Active Self  calcium-vitamin D [name] WITH D) 500-200 MG-UNIT tablet [id number] Yes Take 1 tablet by mouth daily. [provider] Taking Active Self  lisinopril-hydrochlorothiazide (ZESTORETIC) 20-25 MG tablet [id number] Yes TAKE 2 TABLETS BY MOUTH DAILY FOR BLOOD PRESSURE  Patient taking differently: Take 1 tablet by mouth 2 (two) times daily. FOR BLOOD PRESSURE   Anders Simmonds, PA-C Taking Active Self  Multiple Vitamins-Minerals (MENS MULTIVITAMIN PLUS PO) [id number] Yes Take 1  tablet by mouth daily. [provider] Taking Active Self  naproxen (NAPROSYN) 250 MG tablet [id number] Yes Take 500 mg by mouth 2 (two) times daily with a meal. [provider] Taking Active Self  traMADol (ULTRAM) 50 MG tablet [id number] Yes Take 1 tablet (50 mg total) by mouth every 6 (six) hours as needed. Ocie Doyne, DMD Taking Active            Med Note Ardelia Mems, MELANIE A   Mon Oct 22, 2020  3:44 PM) Took one time, has not needed since  vitamin B-12 (CYANOCOBALAMIN) 500 MCG tablet  [id number] Yes Take 500 mcg by mouth daily. [provider] Taking Active Self          Patient Active Problem List   Diagnosis Date Noted  . Hemiparesis affecting right side as late effect of cerebrovascular accident (CVA) (HCC) 09/06/2020  . Personal history of fall 09/06/2020  . Chronic right shoulder pain 09/06/2020  . Closed bimalleolar fracture of right ankle 07/27/2020  . History of loop recorder 05/02/2020  . Carotid artery disease (HCC) 05/02/2020  . Right hemiparesis (HCC) 04/30/2020  . Obesity (BMI 35.0-39.9 without comorbidity) 04/30/2020  . Hyperlipidemia 04/26/2019  . Hypokalemia   . Acute embolic stroke (HCC)   . History of CVA (cerebrovascular accident) 02/11/2018  . Acute kidney injury superimposed on chronic kidney disease (HCC) 02/11/2018  . History of syphilis 02/11/2018  . Constipation 02/11/2018  . Osteoarthritis 02/11/2018  . Benign essential HTN 02/11/2018  . Acute encephalopathy 02/10/2018    Conditions to be addressed/monitored per PCP order:  Blood pressure cuff  There are no care plans that you recently modified to display for this patient.   Follow up:  Patient agrees to Care Plan and Follow-up.  Plan: The Managed Medicaid care management team will reach out to the patient again over the next 30 days.  Date/time of next scheduled Social Work care management/care coordination outreach:  11/26/20  [name], Kenard Gower, [facility]  [facility]  High Risk Managed Medicaid Team  [phone]

## 2020-10-25 NOTE — Patient Instructions (Signed)
Visit Information  [name] was given information about Medicaid Managed Care team care coordination services as a part of their [facility] Community Plan Medicaid benefit. Robert Donovan verbally consented to engagement with the [facility] Managed Care team.   For questions related to your [facility], please call: [phone] or visit the homepage here: [email or web address]  If you would like to schedule transportation through your [facility], please call the following number at least 2 days in advance of your appointment: [phone].   Call the Behavioral Health Crisis Line at [phone], at any time, 24 hours a day, 7 days a week. If you are in danger or need immediate medical attention call 911.  [name] - following are the goals we discussed in your visit today:  Goals Addressed   None      Social Worker will follow up in 30 days.   Robert Donovan, BSW, [facility]  Robert Donovan  High Risk Managed Medicaid Team  [phone]  Following is a copy of your plan of care:

## 2020-10-26 ENCOUNTER — Other Ambulatory Visit: Payer: Self-pay

## 2020-10-26 DIAGNOSIS — I10 Essential (primary) hypertension: Secondary | ICD-10-CM

## 2020-10-26 MED ORDER — MISC. DEVICES MISC: 0 refills | Status: AC

## 2020-10-31 DIAGNOSIS — R531 Weakness: Secondary | ICD-10-CM | POA: Diagnosis not present

## 2020-11-01 DIAGNOSIS — G8193 Hemiplegia, unspecified affecting right nondominant side: Secondary | ICD-10-CM | POA: Diagnosis not present

## 2020-11-02 DIAGNOSIS — G8193 Hemiplegia, unspecified affecting right nondominant side: Secondary | ICD-10-CM | POA: Diagnosis not present

## 2020-11-04 LAB — CUP PACEART REMOTE DEVICE CHECK
Date Time Interrogation Session: 20220602230658
Implantable Pulse Generator Implant Date: 20201214

## 2020-11-05 ENCOUNTER — Ambulatory Visit (INDEPENDENT_AMBULATORY_CARE_PROVIDER_SITE_OTHER): Payer: Medicaid Other

## 2020-11-05 DIAGNOSIS — G8193 Hemiplegia, unspecified affecting right nondominant side: Secondary | ICD-10-CM | POA: Diagnosis not present

## 2020-11-05 DIAGNOSIS — I639 Cerebral infarction, unspecified: Secondary | ICD-10-CM | POA: Diagnosis not present

## 2020-11-06 DIAGNOSIS — G8193 Hemiplegia, unspecified affecting right nondominant side: Secondary | ICD-10-CM | POA: Diagnosis not present

## 2020-11-07 DIAGNOSIS — G8193 Hemiplegia, unspecified affecting right nondominant side: Secondary | ICD-10-CM | POA: Diagnosis not present

## 2020-11-12 ENCOUNTER — Other Ambulatory Visit: Payer: Self-pay

## 2020-11-12 DIAGNOSIS — G8193 Hemiplegia, unspecified affecting right nondominant side: Secondary | ICD-10-CM | POA: Diagnosis not present

## 2020-11-12 NOTE — Patient Outreach (Signed)
Spoke with mother, she forgot to get the meds today. Will call back next week

## 2020-11-13 ENCOUNTER — Encounter: Payer: Self-pay | Admitting: Orthopaedic Surgery

## 2020-11-13 ENCOUNTER — Ambulatory Visit (INDEPENDENT_AMBULATORY_CARE_PROVIDER_SITE_OTHER): Payer: Medicaid Other | Admitting: Orthopaedic Surgery

## 2020-11-13 VITALS — BP 113/72 | HR 67 | Ht 68.0 in | Wt 280.0 lb

## 2020-11-13 DIAGNOSIS — S82841S Displaced bimalleolar fracture of right lower leg, sequela: Secondary | ICD-10-CM

## 2020-11-13 DIAGNOSIS — G8193 Hemiplegia, unspecified affecting right nondominant side: Secondary | ICD-10-CM | POA: Diagnosis not present

## 2020-11-13 DIAGNOSIS — I69351 Hemiplegia and hemiparesis following cerebral infarction affecting right dominant side: Secondary | ICD-10-CM | POA: Diagnosis not present

## 2020-11-13 NOTE — Progress Notes (Signed)
Office Visit Note   Patient: Robert Donovan           Date of Birth: 1955/11/14           MRN: [id number] Visit Date: 11/13/2020              Requested by: [name], MD [address] [place],  [place] [address] PCP: [name], MD   Assessment & Plan: Visit Diagnoses:  1. Hemiparesis affecting right side as late effect of cerebrovascular accident (CVA) (HCC)   2. Closed bimalleolar fracture of right ankle, sequela     Plan: Patient is released from care.  Bimalleolar ankle fracture treated conservatively with casting healed and mild varus deformity.  He can follow-up on an as-needed basis.  He can continue to use the cam boot in the short-term gradually work his way into a tennis shoe.  Follow-Up Instructions: No follow-ups on file.   Orders:  No orders of the defined types were placed in this encounter.  No orders of the defined types were placed in this encounter.     Procedures: No procedures performed   Clinical Data: No additional findings.   Subjective: Chief Complaint  Patient presents with   Right Ankle - Fracture, Follow-up    HPI follow-up right bimalleolar ankle fracture in February from falling in a grocery store.  He has had previous CVAs.  He is ambulatory in his cam boot.  He can use his walker or cane when he gets up.  He has been using his sister's power chair .  Patient states it no longer hurts he still has mild varus of the ankle which is not changed since initial casting.  Review of Systems updated unchanged.   Objective: Vital Signs: BP 113/72   Pulse 67   Ht 5\' 8"  (1.727 m)   Wt 280 lb (127 kg)   BMI 42.57 kg/m   Physical Exam Constitutional:      Appearance: He is well-developed.  HENT:     Head: Normocephalic and atraumatic.     Right Ear: External ear normal.     Left Ear: External ear normal.  Eyes:     Pupils: Pupils are equal, round, and reactive to light.  Neck:     Thyroid: No thyromegaly.     Trachea:  No tracheal deviation.  Cardiovascular:     Rate and Rhythm: Normal rate.  Pulmonary:     Effort: Pulmonary effort is normal.     Breath sounds: No wheezing.  Abdominal:     General: Bowel sounds are normal.     Palpations: Abdomen is soft.  Musculoskeletal:     Cervical back: Neck supple.  Skin:    General: Skin is warm and dry.     Capillary Refill: Capillary refill takes less than 2 seconds.  Neurological:     Mental Status: He is alert and oriented to person, place, and time.  Psychiatric:        Behavior: Behavior normal.        Thought Content: Thought content normal.        Judgment: Judgment normal.    Ortho Exam patient has some mild edema right lower extremity.  Mild varus with ankle.  Ankle dorsiflexion plantarflexion is nonpainful.  No tenderness at the medial or lateral malleolus.  Specialty Comments:  No specialty comments available.  Imaging: No results found.   PMFS History: Patient Active Problem List   Diagnosis Date Noted   Hemiparesis  affecting right side as late effect of cerebrovascular accident (CVA) (HCC) 09/06/2020   Personal history of fall 09/06/2020   Chronic right shoulder pain 09/06/2020   Closed bimalleolar fracture of right ankle 07/27/2020   History of loop recorder 05/02/2020   Carotid artery disease (HCC) 05/02/2020   Right hemiparesis (HCC) 04/30/2020   Obesity (BMI 35.0-39.9 without comorbidity) 04/30/2020   Hyperlipidemia 04/26/2019   Hypokalemia    Acute embolic stroke (HCC)    History of CVA (cerebrovascular accident) 02/11/2018   Acute kidney injury superimposed on chronic kidney disease (HCC) 02/11/2018   History of syphilis 02/11/2018   Constipation 02/11/2018   Osteoarthritis 02/11/2018   Benign essential HTN 02/11/2018   Acute encephalopathy 02/10/2018   Past Medical History:  Diagnosis Date   Arthritis    Bilateral Knees   High cholesterol    History of loop recorder 05/16/2019   Hypertension    Stroke (HCC)  02/10/2018    Family History  Problem Relation Age of Onset   Healthy Mother    Heart attack Father    Diabetes Brother     Past Surgical History:  Procedure Laterality Date   LOOP RECORDER IMPLANT  05/16/2019   TEE WITHOUT CARDIOVERSION N/A 02/16/2018   Procedure: TRANSESOPHAGEAL ECHOCARDIOGRAM (TEE);  Surgeon: Jodelle Red, MD;  [facility] END[facility]COPY;  Service: Cardiovascular;  Laterality: N/A;   TOOTH EXTRACTION Bilateral 10/19/2020   Procedure: DENTAL RESTORATION/EXTRACTIONS;  Surgeon: Ocie Doyne, DMD;  Location: [facility] OR;  Service: Oral Surgery;  Laterality: Bilateral;   Social History   Occupational History   Not on file  Tobacco Use   Smoking status: Former    Pack years: 0.00   Smokeless tobacco: Never  Vaping Use   Vaping Use: Never used  Substance and Sexual Activity   Alcohol use: Not Currently   Drug use: Not Currently    Comment: hx marijuana use   Sexual activity: Not Currently    Partners: Female    Birth control/protection: None    Comment: last encounter 2011

## 2020-11-14 DIAGNOSIS — G8193 Hemiplegia, unspecified affecting right nondominant side: Secondary | ICD-10-CM | POA: Diagnosis not present

## 2020-11-19 DIAGNOSIS — G8193 Hemiplegia, unspecified affecting right nondominant side: Secondary | ICD-10-CM | POA: Diagnosis not present

## 2020-11-20 DIAGNOSIS — G8193 Hemiplegia, unspecified affecting right nondominant side: Secondary | ICD-10-CM | POA: Diagnosis not present

## 2020-11-21 ENCOUNTER — Other Ambulatory Visit: Payer: Self-pay | Admitting: Internal Medicine

## 2020-11-21 ENCOUNTER — Other Ambulatory Visit: Payer: Self-pay

## 2020-11-21 DIAGNOSIS — I10 Essential (primary) hypertension: Secondary | ICD-10-CM

## 2020-11-21 DIAGNOSIS — I6523 Occlusion and stenosis of bilateral carotid arteries: Secondary | ICD-10-CM

## 2020-11-21 DIAGNOSIS — G8193 Hemiplegia, unspecified affecting right nondominant side: Secondary | ICD-10-CM | POA: Diagnosis not present

## 2020-11-21 DIAGNOSIS — N182 Chronic kidney disease, stage 2 (mild): Secondary | ICD-10-CM

## 2020-11-21 MED ORDER — ASPIRIN EC 81 MG PO TBEC: 81.0000 mg | DELAYED_RELEASE_TABLET | Freq: Every day | ORAL | 3 refills | Status: DC

## 2020-11-21 MED ORDER — ATORVASTATIN CALCIUM 40 MG PO TABS: 40.0000 mg | ORAL_TABLET | Freq: Every evening | ORAL | 2 refills | Status: DC

## 2020-11-21 MED ORDER — CYANOCOBALAMIN 500 MCG PO TABS: 500.0000 ug | ORAL_TABLET | Freq: Every day | ORAL | 2 refills | Status: DC

## 2020-11-21 MED ORDER — AMLODIPINE BESYLATE 5 MG PO TABS: 5.0000 mg | ORAL_TABLET | Freq: Every day | ORAL | 2 refills | Status: DC

## 2020-11-21 MED ORDER — LISINOPRIL-HYDROCHLOROTHIAZIDE 20-25 MG PO TABS: 1.0000 | ORAL_TABLET | Freq: Two times a day (BID) | ORAL | 2 refills | Status: DC

## 2020-11-21 NOTE — Patient Outreach (Signed)
Coordinated medications with mother and looked up fill Hx   Medication Name PCP Transfer (put Dr.'s name) -Timing Last Fill Date & Day Supply Format: MM/DD/YY - DS (If last fill/DS unavailable, list pt.'s quantity on hand) Anticipated next due date  Format: MM/DD/YY      BB  B  L  EM  BT    Amlodipine 5mg    1    09/12/20 for 90 days 12/11/20  ASA 81mg  02/11/21   1     First packs  Atorvastatin 40mg    1    09/12/20 for 90 days 12/11/20  Calcium/Vit D OSCAL 500-200 Jonah Blue   1     First packs  Lisinopril/HCTZ 20/25   09/14/20  2    11/06/20 for 30 days 12/06/20  Vitamin B12 [place] 01/06/21   1     First packs  Men's MVI 02/06/21   1     First packs

## 2020-11-26 ENCOUNTER — Other Ambulatory Visit: Payer: Self-pay

## 2020-11-26 ENCOUNTER — Other Ambulatory Visit: Payer: Self-pay | Admitting: *Deleted

## 2020-11-26 DIAGNOSIS — G8193 Hemiplegia, unspecified affecting right nondominant side: Secondary | ICD-10-CM | POA: Diagnosis not present

## 2020-11-26 NOTE — Patient Outreach (Signed)
Medicaid Managed Care Social Work Note  11/26/2020 Name:  Robert Donovan MRN:  [id number] DOB:  [birth year]  Robert Donovan is an 65 y.o. year old male who is a primary patient of [name], MD.  The [facility] Managed Care Coordination team was consulted for assistance with:   pcs, blood pressure cuff, and inconitance supplies.  Robert Donovan was given information about Medicaid Managed CareCoordination services today. Robert Donovan agreed to services and verbal consent obtained.  Engaged with patient  for by telephone forfollow up visit in response to referral for case management and/or care coordination services.   Assessments/Interventions:  Review of past medical history, allergies, medications, health status, including review of consultants reports, laboratory and other test data, was performed as part of comprehensive evaluation and provision of chronic care management services.  SDOH: (Social Determinant of Health) assessments and interventions performed: BSW completed a follow up call with Robert Donovan. She states patient is doing well. Patient has received a new aide that he likes. Mom states she does not remember the name of the agency patient is using. Mom states patient still has not received his blood pressure cuff. Mom states that patient is in need of incontinence supplies size 1x. BSW will send a message to Robert PCP.  Advanced Directives Status:  Not addressed in this encounter.  Care Plan                 Allergies  Allergen Reactions   Hydrocodone     Pt stated, "I am not allergic, I do not want to take this medicine ever" Codeine - "I do not want to ever take this medicine - not allergic"   Oxycodone     Pt stated, "I am not allergic, I do not want to take this medicine ever"    Medications Reviewed Today     Reviewed by Adah Perl, Autumn L, RT (Technologist) on 11/13/20 at 1018  Med List Status: <None>   Medication Order Taking? Sig Documenting Provider  Last Dose Status Informant  amLODipine (NORVASC) 5 MG tablet [id number] No TAKE 1 TABLET(5 MG) BY MOUTH DAILY FOR BLOOD PRESSURE  Patient taking differently: Take 5 mg by mouth daily.   [name], MD Taking Active Self  amoxicillin (AMOXIL) 500 MG capsule [id number] No Take 1 capsule (500 mg total) by mouth 3 (three) times daily. Ocie Doyne, DMD Taking Active   aspirin EC 81 MG tablet [id number] No Take 1 tablet (81 mg total) by mouth daily. Anders Simmonds, PA-C Taking Active Self  atorvastatin (LIPITOR) 40 MG tablet [id number] No TAKE 1 TABLET BY MOUTH IN THE EVENING  Patient taking differently: Take 40 mg by mouth every evening.   [name], MD Taking Active Self  calcium-vitamin D (OSCAL WITH D) 500-200 MG-UNIT tablet [id number] No Take 1 tablet by mouth daily. [provider] Taking Active Self  lisinopril-hydrochlorothiazide (ZESTORETIC) 20-25 MG tablet [id number] No TAKE 2 TABLETS BY MOUTH DAILY FOR BLOOD PRESSURE  Patient taking differently: Take 1 tablet by mouth 2 (two) times daily. FOR BLOOD PRESSURE   Sharon Seller Marzella Schlein, PA-C Taking Active Self  Misc. Devices MISC [id number]  Large blood pressure cuff device For home blood pressure monitoring [name], MD  Active   Multiple Vitamins-Minerals (MENS MULTIVITAMIN PLUS PO) [id number] No Take 1 tablet by mouth daily. [provider] Taking Active Self  naproxen (NAPROSYN) 250 MG tablet [id number] No Take 500 mg by mouth 2 (two) times daily with a meal.  [provider] Taking Active Self  traMADol (ULTRAM) 50 MG tablet [id number] No Take 1 tablet (50 mg total) by mouth every 6 (six) hours as needed. Ocie Doyne, DMD Taking Active            Med Note Ardelia Mems, MELANIE A   Mon Oct 22, 2020  3:44 PM) Took one time, has not needed since  vitamin B-12 (CYANOCOBALAMIN) 500 MCG tablet [id number] No Take 500 mcg by mouth daily. [provider] Taking Active Self            Patient  Active Problem List   Diagnosis Date Noted   Hemiparesis affecting right side as late effect of cerebrovascular accident (CVA) (HCC) 09/06/2020   Personal history of fall 09/06/2020   Chronic right shoulder pain 09/06/2020   Closed bimalleolar fracture of right ankle 07/27/2020   History of loop recorder 05/02/2020   Carotid artery disease (HCC) 05/02/2020   Right hemiparesis (HCC) 04/30/2020   Obesity (BMI 35.0-39.9 without comorbidity) 04/30/2020   Hyperlipidemia 04/26/2019   Hypokalemia    Acute embolic stroke (HCC)    History of CVA (cerebrovascular accident) 02/11/2018   Acute kidney injury superimposed on chronic kidney disease (HCC) 02/11/2018   History of syphilis 02/11/2018   Constipation 02/11/2018   Osteoarthritis 02/11/2018   Benign essential HTN 02/11/2018   Acute encephalopathy 02/10/2018    Conditions to be addressed/monitored per PCP order:   blood pressure cuff and incontinence supplies  There are no care plans that you recently modified to display for this patient.   Follow up:  Patient agrees to Care Plan and Follow-up.  Plan: The Managed Medicaid care management team will reach out to the patient again over the next 30 days.  Date/time of next scheduled Social Work care management/care coordination outreach:  12/26/20  [name], Kenard Gower, [facility]  [facility]  High Risk Managed Medicaid Team  [phone]

## 2020-11-26 NOTE — Patient Outreach (Signed)
Medicaid Managed Care   Nurse Care Manager Note  11/26/2020 Name:  Robert Donovan MRN:  [id number] DOB:  [birth year]  Robert Donovan is an 65 y.o. year old male who is a primary patient of Robert Matar, MD.  The Medicaid Managed Care Coordination team was consulted for assistance with:    Hx Stroke  [name] was given information about Medicaid Managed Care Coordination team services today. [name] agreed to services and verbal consent obtained.  Engaged with patient by telephone for follow up visit in response to provider referral for case management and/or care coordination services.   Assessments/Interventions:  Review of past medical history, allergies, medications, health status, including review of consultants reports, laboratory and other test data, was performed as part of comprehensive evaluation and provision of chronic care management services.  SDOH (Social Determinants of Health) assessments and interventions performed:   Care Plan  Allergies  Allergen Reactions   Hydrocodone     Pt stated, "I am not allergic, I do not want to take this medicine ever" Codeine - "I do not want to ever take this medicine - not allergic"   Oxycodone     Pt stated, "I am not allergic, I do not want to take this medicine ever"    Medications Reviewed Today     Reviewed by Adah Perl, Autumn L, RT (Technologist) on 11/13/20 at 1018  Med List Status: <None>   Medication Order Taking? Sig Documenting Provider Last Dose Status Informant  amLODipine (NORVASC) 5 MG tablet [id number] No TAKE 1 TABLET(5 MG) BY MOUTH DAILY FOR BLOOD PRESSURE  Patient taking differently: Take 5 mg by mouth daily.   Robert Matar, MD Taking Active Self  amoxicillin (AMOXIL) 500 MG capsule [id number] No Take 1 capsule (500 mg total) by mouth 3 (three) times daily. Ocie Doyne, DMD Taking Active   aspirin EC 81 MG tablet [id number] No Take 1 tablet (81 mg total) by mouth daily. Anders Simmonds, PA-C  Taking Active Self  atorvastatin (LIPITOR) 40 MG tablet [id number] No TAKE 1 TABLET BY MOUTH IN THE EVENING  Patient taking differently: Take 40 mg by mouth every evening.   Robert Matar, MD Taking Active Self  calcium-vitamin D (OSCAL WITH D) 500-200 MG-UNIT tablet [id number] No Take 1 tablet by mouth daily. [provider] Taking Active Self  lisinopril-hydrochlorothiazide (ZESTORETIC) 20-25 MG tablet [id number] No TAKE 2 TABLETS BY MOUTH DAILY FOR BLOOD PRESSURE  Patient taking differently: Take 1 tablet by mouth 2 (two) times daily. FOR BLOOD PRESSURE   Sharon Seller Marzella Schlein, PA-C Taking Active Self  Misc. Devices MISC [id number]  Large blood pressure cuff device For home blood pressure monitoring Robert Matar, MD  Active   Multiple Vitamins-Minerals (MENS MULTIVITAMIN PLUS PO) [id number] No Take 1 tablet by mouth daily. [provider] Taking Active Self  naproxen (NAPROSYN) 250 MG tablet [id number] No Take 500 mg by mouth 2 (two) times daily with a meal. [provider] Taking Active Self  traMADol (ULTRAM) 50 MG tablet [id number] No Take 1 tablet (50 mg total) by mouth every 6 (six) hours as needed. Ocie Doyne, DMD Taking Active            Med Note Ardelia Mems, Louella Medaglia A   Mon Oct 22, 2020  3:44 PM) Took one time, has not needed since  vitamin B-12 (CYANOCOBALAMIN) 500 MCG tablet [id number] No Take 500 mcg by mouth daily. [provider] Taking Active Self  Patient Active Problem List   Diagnosis Date Noted   Hemiparesis affecting right side as late effect of cerebrovascular accident (CVA) (HCC) 09/06/2020   Personal history of fall 09/06/2020   Chronic right shoulder pain 09/06/2020   Closed bimalleolar fracture of right ankle 07/27/2020   History of loop recorder 05/02/2020   Carotid artery disease (HCC) 05/02/2020   Right hemiparesis (HCC) 04/30/2020   Obesity (BMI 35.0-39.9 without comorbidity) 04/30/2020   Hyperlipidemia  04/26/2019   Hypokalemia    Acute embolic stroke (HCC)    History of CVA (cerebrovascular accident) 02/11/2018   Acute kidney injury superimposed on chronic kidney disease (HCC) 02/11/2018   History of syphilis 02/11/2018   Constipation 02/11/2018   Osteoarthritis 02/11/2018   Benign essential HTN 02/11/2018   Acute encephalopathy 02/10/2018    Conditions to be addressed/monitored per PCP order:   hx stroke  Care Plan : General Plan of Care (Adult)  Updates made by [name], RN since 11/26/2020 12:00 AM     Problem: Health Promotion or Disease Self-Management (General Plan of Care)      Long-Range Goal: Self-Management Plan Developed   Start Date: 10/22/2020  Expected End Date: 01/25/2021  Recent Progress: On track  Priority: High  Note:   Current Barriers:  Ineffective Self Health Maintenance-RNCM was unable to reach [name], spoke with patient's DPR [name]. [name] helps [name] manage his healthcare at home. [name] had a stroke which has left him with right sided weakness. He fell while using a walker and broke his foot.  His short term memory is declining and he forgets to take his medications and gets confused as to what he is suppose to take and when. He has an aide that comes in 3 times a week to assist with bathing and dressing. [name] is in need of an eye exam. [name] has questions regarding the patients BP medications and dosage, progress of referral for motorized wheelchair. [name] had all of his teeth pulled last week and will be getting dentures in the future.-Update-[name] and DPR, [name], reports that they have been contacted re: motorized wheelchair and it is being built. They are happy to hear the progress. Patient is healing after having all of his teeth extracted and waiting on dentures. An eye exam has not been scheduled at this time.  Unable to self administer medications as prescribed Unable to perform ADLs  independently Does not contact provider office for questions/concerns Currently UNABLE TO independently self manage needs related to chronic health conditions.  Knowledge Deficits related to short term plan for care coordination needs and long term plans for chronic disease management needs Nurse Case Manager Clinical Goal(s):  patient will work with care management team to address care coordination and chronic disease management needs related to Disease Management   Interventions:  Evaluation of current treatment plan related to HTN and mobility and patient's adherence to plan as established by provider. Advised patient/DPR to contact PCP provider with any questions and concerns Reviewed medications with patient's DPR and discussed the importance of taking as directed. Encouraged to write down any questions to discuss with Harrold Donath tomorrow Discussed plans with patient/DPR for ongoing care management follow up and provided patient with direct contact information for care management team Advised patient/DPR, providing education and rationale, to monitor blood pressure daily and record, calling PCP for findings outside established parameters.  Provided patient/ and/or caregiver with contact information about [facility] [phone] and [facility]  [phone] Reviewed scheduled/upcoming provider appointments  Collaborated with PCP office regarding referral for eye exam Self Care Activities:  Patient will self administer medications as prescribed Patient will attend all scheduled provider appointments Patient will attend church or other social activities Patient will call provider office for new concerns or questions Patient will work with BSW to address care coordination needs and will continue to work with the clinical team to address health care and disease management related needs.   Patient will work with [facility] Pharmacist, Harrold Donath for medication management Patient Goals: - Call [facility]  [phone]) or follow up on referral from PCP, to schedule an appointment for an eye exam - begin a notebook of services in my neighborhood or community - follow-up on any referrals for help I am given  - attend all scheduled appointments, call to schedule follow up with PCP - arrange a ride through an agency 1 week before appointment - call to cancel if needed - keep a calendar with prescription refill dates - keep a calendar with appointment dates  - Contact provider office with any questions or concerns Follow Up Plan: Telephone follow up appointment with care management team member scheduled for:01/28/21 @ 2:30pm      Follow Up:  Patient agrees to Care Plan and Follow-up.  Plan: The Managed Medicaid care management team will reach out to the patient again over the next 60 days.  Date/time of next scheduled RN care management/care coordination outreach:  01/28/21 @ 2:30pm  Estanislado Emms RN, BSN [place]  [facility] RN Care Coordinator

## 2020-11-26 NOTE — Patient Instructions (Signed)
Visit Information  Robert Donovan was given information about Medicaid Managed Care team care coordination services as a part of their [facility] Community Plan Medicaid benefit. Robert Donovan verbally consented to engagement with the [facility] Managed Care team.   For questions related to your [facility], please call: [phone] or visit the homepage here: [email or web address]  If you would like to schedule transportation through your [facility], please call the following number at least 2 days in advance of your appointment: [phone].   Call the Behavioral Health Crisis Line at [phone], at any time, 24 hours a day, 7 days a week. If you are in danger or need immediate medical attention call 911.  Robert Donovan - following are the goals we discussed in your visit today:   Goals Addressed   None      Social Worker will follow up with patient in 30 days.   [name], BSW, [place] [facility]  Center  High Risk Managed Medicaid Team  [phone]   Following is a copy of your plan of care:

## 2020-11-26 NOTE — Patient Instructions (Signed)
Visit Information  Mr. Schweigert's DPR, [name], was given information about Medicaid Managed Care team care coordination services as a part of their [facility] Community Plan Medicaid benefit. Halim Lore's DPR, [name]  to engagement with the [facility] Managed Care team.   For questions related to your [facility], please call: [phone] or visit the homepage here: [email or web address]  If you would like to schedule transportation through your [facility], please call the following number at least 2 days in advance of your appointment: [phone].   Call the Behavioral Health Crisis Line at [phone], at any time, 24 hours a day, 7 days a week. If you are in danger or need immediate medical attention call 911.  Mr. Robben - following are the goals we discussed in your visit today:   Goals Addressed             This Visit's Progress    Find Help in My Community       Timeframe:  Long-Range Goal Priority:  High Start Date:   10/22/20                          Expected End Date:  01/28/21                    Follow Up Date 01/28/21   - Call [facility] [phone]) to schedule an appointment for an eye exam - begin a notebook of services in my neighborhood or community - follow-up on any referrals for help I am given    Why is this important?   Knowing how and where to find help for yourself or family in your neighborhood and community is an important skill.  You will want to take some steps to learn how.          Make and Keep All Appointments       Timeframe:  Long-Range Goal Priority:  High Start Date:   10/22/20                          Expected End Date: 01/28/21                      Follow Up Date 01/28/21  - attend all scheduled appointments, call to schedule follow up with PCP - arrange a ride through an agency 1 week before appointment - call to  cancel if needed - keep a calendar with prescription refill dates - keep a calendar with appointment dates  - Contact provider office with any questions or concerns   Why is this important?   Part of staying healthy is seeing the doctor for follow-up care.  If you forget your appointments, there are some things you can do to stay on track.             Please see education materials related to dash diet provided by MyChart link.  Patient verbalizes understanding of instructions provided today.   Telephone follow up appointment with Managed Medicaid care management team member scheduled for:01/28/21 @ 2:30pm  Estanislado Emms RN, BSN [place]  [facility] RN Care Coordinator   Following is a copy of your plan of care:  Patient Care Plan: General Plan of Care (Adult)     Problem Identified: Health Promotion or Disease Self-Management (General Plan of Care)      Long-Range Goal: Self-Management  Plan Developed   Start Date: 10/22/2020  Expected End Date: 01/25/2021  Recent Progress: On track  Priority: High  Note:   Current Barriers:  Ineffective Self Health Maintenance-RNCM was unable to reach Mr. Motta, spoke with patient's DPR [name]. [name] helps Mr. Kreitzer manage his healthcare at home. 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 He fell while using a walker and broke his foot.  His short term memory is declining and he forgets to take his medications and gets confused as to what he is suppose to take and when. He has an aide that comes in 3 times a week to assist with bathing and dressing. Mr. Mclinden is in need of an eye exam. [name] has questions regarding the patients BP medications and dosage, progress of referral for motorized wheelchair. Mr. Kleckner had all of his teeth pulled last week and will be getting dentures in the future.-Update-[name] and DPR, [name], reports that they have been contacted re:  motorized wheelchair and it is being built. They are happy to hear the progress. Patient is healing after having all of his teeth extracted and waiting on dentures. An eye exam has not been scheduled at this time.  Unable to self administer medications as prescribed Unable to perform ADLs independently Does not contact provider office for questions/concerns Currently UNABLE TO independently self manage needs related to chronic health conditions.  Knowledge Deficits related to short term plan for care coordination needs and long term plans for chronic disease management needs Nurse Case Manager Clinical Goal(s):  patient will work with care management team to address care coordination and chronic disease management needs related to Disease Management   Interventions:  Evaluation of current treatment plan related to HTN and mobility and patient's adherence to plan as established by provider. Advised patient/DPR to contact PCP provider with any questions and concerns Reviewed medications with patient's DPR and discussed the importance of taking as directed. Encouraged to write down any questions to discuss with Harrold Donath tomorrow Discussed plans with patient/DPR for ongoing care management follow up and provided patient with direct contact information for care management team Advised patient/DPR, providing education and rationale, to monitor blood pressure daily and record, calling PCP for findings outside established parameters.  Provided patient/ and/or caregiver with contact information about [facility] [phone] and [phone] Reviewed scheduled/upcoming provider appointments  Collaborated with PCP office regarding referral for eye exam Self Care Activities:  Patient will self administer medications as prescribed Patient will attend all scheduled provider appointments Patient will attend church or other social activities Patient will call provider office for new concerns or  questions Patient will work with BSW to address care coordination needs and will continue to work with the clinical team to address health care and disease management related needs.   Patient will work with [facility] Pharmacist, Harrold Donath for medication management Patient Goals: - Call [facility] [phone]) or follow up on referral from PCP, to schedule an appointment for an eye exam - begin a notebook of services in my neighborhood or community - follow-up on any referrals for help I am given  - attend all scheduled appointments, call to schedule follow up with PCP - arrange a ride through an agency 1 week before appointment - call to cancel if needed - keep a calendar with prescription refill dates - keep a calendar with appointment dates  - Contact provider office with any questions or concerns Follow Up Plan: Telephone  follow up appointment with care management team member scheduled for:01/28/21 @ 2:30pm     Patient Care Plan: Medication Management     Problem Identified: Health Promotion or Disease Self-Management (General Plan of Care)      Goal: Medication Management   Note:   Current Barriers:  Unable to self administer medications as prescribed Does not adhere to prescribed medication regimen Does not maintain contact with provider office Does not contact provider office for questions/concerns   Pharmacist Clinical Goal(s):  Over the next 30 days, patient will contact provider office for questions/concerns as evidenced notation of same in electronic health record through collaboration with PharmD and provider.    Interventions: Inter-disciplinary care team collaboration (see longitudinal plan of care) Comprehensive medication review performed; medication list updated in electronic medical record  @RXCPHYPERTENSION @ @RXCPHYPERLIPIDEMIA @  Patient Goals/Self-Care Activities Over the next 30 days, patient will:  - collaborate with provider on medication access  solutions  Follow Up Plan: The care management team will reach out to the patient again over the next 30 days.

## 2020-11-27 ENCOUNTER — Telehealth: Payer: Self-pay | Admitting: Internal Medicine

## 2020-11-27 DIAGNOSIS — G8193 Hemiplegia, unspecified affecting right nondominant side: Secondary | ICD-10-CM | POA: Diagnosis not present

## 2020-11-27 DIAGNOSIS — Z0100 Encounter for examination of eyes and vision without abnormal findings: Secondary | ICD-10-CM

## 2020-11-27 NOTE — Progress Notes (Signed)
Carelink Summary Report / Loop Recorder 

## 2020-11-27 NOTE — Telephone Encounter (Signed)
-----   Message from [name], RN sent at 11/26/2020  3:14 PM EDT ----- Regarding: referral Hi Dr. Laural Benes,  Can you place a referral for opthalmology for this patient for an eye exam? Thank you!  Estanislado Emms RN, BSN [place]  [facility]

## 2020-11-28 DIAGNOSIS — G8193 Hemiplegia, unspecified affecting right nondominant side: Secondary | ICD-10-CM | POA: Diagnosis not present

## 2020-11-29 NOTE — Patient Outreach (Signed)
Asked PCP for Calcium script to [facility]

## 2020-11-30 DIAGNOSIS — R531 Weakness: Secondary | ICD-10-CM | POA: Diagnosis not present

## 2020-12-03 DIAGNOSIS — G8193 Hemiplegia, unspecified affecting right nondominant side: Secondary | ICD-10-CM | POA: Diagnosis not present

## 2020-12-04 DIAGNOSIS — G8193 Hemiplegia, unspecified affecting right nondominant side: Secondary | ICD-10-CM | POA: Diagnosis not present

## 2020-12-05 DIAGNOSIS — G8193 Hemiplegia, unspecified affecting right nondominant side: Secondary | ICD-10-CM | POA: Diagnosis not present

## 2020-12-06 ENCOUNTER — Other Ambulatory Visit: Payer: Self-pay | Admitting: Physician Assistant

## 2020-12-06 DIAGNOSIS — I129 Hypertensive chronic kidney disease with stage 1 through stage 4 chronic kidney disease, or unspecified chronic kidney disease: Secondary | ICD-10-CM

## 2020-12-06 DIAGNOSIS — G8193 Hemiplegia, unspecified affecting right nondominant side: Secondary | ICD-10-CM | POA: Diagnosis not present

## 2020-12-10 ENCOUNTER — Ambulatory Visit (INDEPENDENT_AMBULATORY_CARE_PROVIDER_SITE_OTHER): Payer: Medicaid Other

## 2020-12-10 ENCOUNTER — Telehealth: Payer: Self-pay | Admitting: Cardiovascular Disease

## 2020-12-10 DIAGNOSIS — I639 Cerebral infarction, unspecified: Secondary | ICD-10-CM | POA: Diagnosis not present

## 2020-12-10 DIAGNOSIS — G8193 Hemiplegia, unspecified affecting right nondominant side: Secondary | ICD-10-CM | POA: Diagnosis not present

## 2020-12-10 LAB — CUP PACEART REMOTE DEVICE CHECK
Date Time Interrogation Session: 20220705230726
Implantable Pulse Generator Implant Date: 20201214

## 2020-12-10 NOTE — Telephone Encounter (Signed)
  1. Has your device fired? no  2. Is you device beeping? no  3. Are you experiencing draining or swelling at device site? no  4. Are you calling to see if we received your device transmission? no  5. Have you passed out? No   Patient has a transmission scheduled for today, but does not know how to send one in.    Please route to Device Clinic Pool

## 2020-12-10 NOTE — Telephone Encounter (Signed)
I explained to the patient that the monitor works automatically. All he has to do is plug the monitor up near where he is sleeping.

## 2020-12-11 DIAGNOSIS — G8193 Hemiplegia, unspecified affecting right nondominant side: Secondary | ICD-10-CM | POA: Diagnosis not present

## 2020-12-12 DIAGNOSIS — G8193 Hemiplegia, unspecified affecting right nondominant side: Secondary | ICD-10-CM | POA: Diagnosis not present

## 2020-12-13 DIAGNOSIS — G8193 Hemiplegia, unspecified affecting right nondominant side: Secondary | ICD-10-CM | POA: Diagnosis not present

## 2020-12-14 DIAGNOSIS — G8193 Hemiplegia, unspecified affecting right nondominant side: Secondary | ICD-10-CM | POA: Diagnosis not present

## 2020-12-17 DIAGNOSIS — G8193 Hemiplegia, unspecified affecting right nondominant side: Secondary | ICD-10-CM | POA: Diagnosis not present

## 2020-12-18 DIAGNOSIS — G8193 Hemiplegia, unspecified affecting right nondominant side: Secondary | ICD-10-CM | POA: Diagnosis not present

## 2020-12-19 DIAGNOSIS — G8193 Hemiplegia, unspecified affecting right nondominant side: Secondary | ICD-10-CM | POA: Diagnosis not present

## 2020-12-20 DIAGNOSIS — G8193 Hemiplegia, unspecified affecting right nondominant side: Secondary | ICD-10-CM | POA: Diagnosis not present

## 2020-12-21 DIAGNOSIS — G8193 Hemiplegia, unspecified affecting right nondominant side: Secondary | ICD-10-CM | POA: Diagnosis not present

## 2020-12-24 DIAGNOSIS — G8193 Hemiplegia, unspecified affecting right nondominant side: Secondary | ICD-10-CM | POA: Diagnosis not present

## 2020-12-25 DIAGNOSIS — G8193 Hemiplegia, unspecified affecting right nondominant side: Secondary | ICD-10-CM | POA: Diagnosis not present

## 2020-12-26 ENCOUNTER — Other Ambulatory Visit: Payer: Self-pay

## 2020-12-26 DIAGNOSIS — G8193 Hemiplegia, unspecified affecting right nondominant side: Secondary | ICD-10-CM | POA: Diagnosis not present

## 2020-12-26 NOTE — Patient Instructions (Signed)
Visit Information  [name]  - as a part of your Medicaid benefit, you are eligible for care management and care coordination services at no cost or copay. I was unable to reach you by phone today but would be happy to help you with your health related needs. Please feel free to call me @ [facility] number).   A member of the Managed Medicaid care management team will reach out to you again over the next 30 days.   [name], BSW, [place] [facility]  [employer] Risk Managed Medicaid Team  [phone]

## 2020-12-26 NOTE — Patient Outreach (Signed)
Care Coordination  12/26/2020  [name] 07/20/1955 [id number]   Medicaid Managed Care   Unsuccessful Outreach Note  12/26/2020 Name: Robert Donovan MRN: [id number] DOB: 06-02-1956  Referred by: [name], MD Reason for referral : No chief complaint on file.   An unsuccessful telephone outreach was attempted today. The patient was referred to the case management team for assistance with care management and care coordination.   Follow Up Plan: The care management team will reach out to the patient again over the next 30 days.   [name], BSW, [place] [facility]  [employer] Risk Managed Medicaid Team  [phone]

## 2020-12-27 DIAGNOSIS — G8193 Hemiplegia, unspecified affecting right nondominant side: Secondary | ICD-10-CM | POA: Diagnosis not present

## 2020-12-28 DIAGNOSIS — G8193 Hemiplegia, unspecified affecting right nondominant side: Secondary | ICD-10-CM | POA: Diagnosis not present

## 2020-12-31 DIAGNOSIS — R531 Weakness: Secondary | ICD-10-CM | POA: Diagnosis not present

## 2020-12-31 DIAGNOSIS — G8193 Hemiplegia, unspecified affecting right nondominant side: Secondary | ICD-10-CM | POA: Diagnosis not present

## 2021-01-01 DIAGNOSIS — G8193 Hemiplegia, unspecified affecting right nondominant side: Secondary | ICD-10-CM | POA: Diagnosis not present

## 2021-01-01 NOTE — Progress Notes (Signed)
Carelink Summary Report / Loop Recorder 

## 2021-01-02 DIAGNOSIS — G8193 Hemiplegia, unspecified affecting right nondominant side: Secondary | ICD-10-CM | POA: Diagnosis not present

## 2021-01-03 DIAGNOSIS — G8193 Hemiplegia, unspecified affecting right nondominant side: Secondary | ICD-10-CM | POA: Diagnosis not present

## 2021-01-04 DIAGNOSIS — G8193 Hemiplegia, unspecified affecting right nondominant side: Secondary | ICD-10-CM | POA: Diagnosis not present

## 2021-01-07 ENCOUNTER — Telehealth: Payer: Self-pay | Admitting: Internal Medicine

## 2021-01-07 DIAGNOSIS — G8193 Hemiplegia, unspecified affecting right nondominant side: Secondary | ICD-10-CM | POA: Diagnosis not present

## 2021-01-07 NOTE — Telephone Encounter (Signed)
Dois Davenport from [facility] called in to see if Patient can be called to schedule an appt for home health of if this was already done. Please call back

## 2021-01-08 ENCOUNTER — Telehealth: Payer: Self-pay

## 2021-01-08 DIAGNOSIS — G8193 Hemiplegia, unspecified affecting right nondominant side: Secondary | ICD-10-CM | POA: Diagnosis not present

## 2021-01-08 DIAGNOSIS — I69351 Hemiplegia and hemiparesis following cerebral infarction affecting right dominant side: Secondary | ICD-10-CM

## 2021-01-08 NOTE — Telephone Encounter (Signed)
Call placed to [facility] and explained need for visit with Dr Laural Benes in order for Robert Donovan to continue to receive PCS.  Appointment scheduled for 01/15/2021 @ 1050 @ [facility].  Julieanne Cotton said he can take SCAT to the clinic.  She is also requesting PT for him to establish an exercise program.  Explained to her that home health PT has been very difficult to obtain for patients due to agency staffing and insurance contract issues.  She was in agreement to having a referral placed for outpatient PT and she said he could take SCAT to rehab also.   She then requested a new wheelchair for patient. She said he is using a borrowed chair and it is too small.  He has fallen out of it twice.   Informed her that Dr Laural Benes would be notified of her requests.

## 2021-01-08 NOTE — Telephone Encounter (Signed)
See TE  notes on 01/08/21 concerning this issue.

## 2021-01-08 NOTE — Telephone Encounter (Signed)
Call placed to [facility] Coordinator [facility] # [phone] regarding request for [facility] referral. She explained that the patient was approved for 6 months PCS through 01/20/2021, so a new referral will be needed in order for patient to continue with services. Explained to  her that the patient has not been seen by Dr Laural Benes since 09/06/2020 so he will need to have an appointment before a new referral can be placed. She said she would notify patient's CM of this status.  Call placed to patient regarding PCS and scheduling appointment with Dr Laural Benes.  He would not provide his birth date and said he was hesitant to provide information over the phone. He was agreeable to having this CM contact his mother , [name].  Call placed to Ms [facility] # [phone]. Peggy answered and stated that [name] was not in yet.  She took message to have [name] return the call to this CM.

## 2021-01-09 ENCOUNTER — Telehealth: Payer: Self-pay

## 2021-01-09 DIAGNOSIS — G8193 Hemiplegia, unspecified affecting right nondominant side: Secondary | ICD-10-CM | POA: Diagnosis not present

## 2021-01-09 NOTE — Telephone Encounter (Signed)
Order placed for PT 

## 2021-01-09 NOTE — Telephone Encounter (Signed)
Call placed to patient's mother, Julieanne Cotton and explained status of power chair order and that [employer] is working on appealing the denial.  She said that he definitely needs a power chair because he is not able to maneuver a manual chair.  This CM also explained that his insurance will not usually pay for manual and power chairs at the same time.    He currently borrows a friend's wheelchair when he needs to go to appointments and then returns the chair when he arrives back home. She also explained that he needs to sleep in a recliner because he is not able to transfer into his bed. She hopes that with PT he will be stronger and able to transfer into the bed even if he needs to use a sliding board.  Regarding  personal care services, Julieanne Cotton stated that the current aide is pregnant and is not really able to hep him with bathing and getting in/out of the shower/tub. Explained to her that she can contact the aide's agency and explain the patient's needs and request a new aide. She then said that she thinks she should try a new agency.  Instructed her to call his insurance company for list of PCS providers in their network.   Also informed her that Dr Laural Benes placed an order for outpatient PT and that office will be contacting her / her son to schedule an appointment.

## 2021-01-09 NOTE — Telephone Encounter (Signed)
Message received from [facility] regarding the power chair order:  They received  have received a denial from insurance and are working on next steps for appeal.

## 2021-01-10 DIAGNOSIS — G8193 Hemiplegia, unspecified affecting right nondominant side: Secondary | ICD-10-CM | POA: Diagnosis not present

## 2021-01-11 DIAGNOSIS — G8193 Hemiplegia, unspecified affecting right nondominant side: Secondary | ICD-10-CM | POA: Diagnosis not present

## 2021-01-14 ENCOUNTER — Ambulatory Visit (INDEPENDENT_AMBULATORY_CARE_PROVIDER_SITE_OTHER): Payer: Medicaid Other

## 2021-01-14 DIAGNOSIS — I639 Cerebral infarction, unspecified: Secondary | ICD-10-CM

## 2021-01-14 DIAGNOSIS — G8193 Hemiplegia, unspecified affecting right nondominant side: Secondary | ICD-10-CM | POA: Diagnosis not present

## 2021-01-14 LAB — CUP PACEART REMOTE DEVICE CHECK
Date Time Interrogation Session: 20220807230709
Implantable Pulse Generator Implant Date: 20201214

## 2021-01-15 ENCOUNTER — Telehealth: Payer: Self-pay

## 2021-01-15 ENCOUNTER — Ambulatory Visit: Payer: Medicaid Other | Attending: Internal Medicine | Admitting: Internal Medicine

## 2021-01-15 ENCOUNTER — Other Ambulatory Visit: Payer: Self-pay

## 2021-01-15 VITALS — BP 111/72 | HR 53 | Temp 98.2°F | Ht 68.0 in | Wt 250.4 lb

## 2021-01-15 DIAGNOSIS — I69351 Hemiplegia and hemiparesis following cerebral infarction affecting right dominant side: Secondary | ICD-10-CM

## 2021-01-15 DIAGNOSIS — Z79899 Other long term (current) drug therapy: Secondary | ICD-10-CM | POA: Diagnosis not present

## 2021-01-15 DIAGNOSIS — Z7982 Long term (current) use of aspirin: Secondary | ICD-10-CM | POA: Insufficient documentation

## 2021-01-15 DIAGNOSIS — Z8249 Family history of ischemic heart disease and other diseases of the circulatory system: Secondary | ICD-10-CM | POA: Insufficient documentation

## 2021-01-15 DIAGNOSIS — S82841S Displaced bimalleolar fracture of right lower leg, sequela: Secondary | ICD-10-CM

## 2021-01-15 DIAGNOSIS — M25511 Pain in right shoulder: Secondary | ICD-10-CM

## 2021-01-15 DIAGNOSIS — I129 Hypertensive chronic kidney disease with stage 1 through stage 4 chronic kidney disease, or unspecified chronic kidney disease: Secondary | ICD-10-CM | POA: Insufficient documentation

## 2021-01-15 DIAGNOSIS — G8929 Other chronic pain: Secondary | ICD-10-CM | POA: Diagnosis not present

## 2021-01-15 DIAGNOSIS — G8193 Hemiplegia, unspecified affecting right nondominant side: Secondary | ICD-10-CM | POA: Diagnosis not present

## 2021-01-15 DIAGNOSIS — Z8619 Personal history of other infectious and parasitic diseases: Secondary | ICD-10-CM | POA: Diagnosis not present

## 2021-01-15 DIAGNOSIS — Z885 Allergy status to narcotic agent status: Secondary | ICD-10-CM | POA: Diagnosis not present

## 2021-01-15 DIAGNOSIS — S82841D Displaced bimalleolar fracture of right lower leg, subsequent encounter for closed fracture with routine healing: Secondary | ICD-10-CM | POA: Insufficient documentation

## 2021-01-15 NOTE — Telephone Encounter (Signed)
Completed PCS form ad office note from today faxed to [employer] [facility] - # [phone]

## 2021-01-15 NOTE — Progress Notes (Signed)
Patient ID: Robert Donovan, male    DOB: 1956-05-09  MRN: [id number]  CC: Referral   Subjective: Robert Donovan is a 65 y.o. male who presents to request PCS services His concerns today include:  Pt with hx of HTN, HL, multifocal LT sided embolic strokes (RT sided weakness lower>upper) loop recorder in place, PVD,  history of syphilis treated by ID Dr. Renold Don, knee arthritis, obesity.    Patient here today to request PCS services.  He is currently receiving PCS services but his current aide is pregnant and not able to do this much as his mother would like.  Mother is requesting a different home health agency altogether. -Patient states he needs assistance with bathing, meal preps and transfers.  He is unable to get in the tub because of the right-sided weakness from previous stroke and healing fracture of the right ankle for which she still wears a Velcro boot.  Currently having to sleep in his living room because of difficulty getting into his bed down the hall.  He has a bedside commode in his living room.  His mother also sent a note with him requesting that his right shoulder be evaluated.  Pain in the right shoulder started last year after he fell in the shower at the nursing home.  Reports pain with certain movements.  He also requests referral back to infectious disease specialist.  He was treated for syphilis by Dr Renold Don last year and was told to f/u in 6 mths Patient Active Problem List   Diagnosis Date Noted   Hemiparesis affecting right side as late effect of cerebrovascular accident (CVA) (HCC) 09/06/2020   Personal history of fall 09/06/2020   Chronic right shoulder pain 09/06/2020   Closed bimalleolar fracture of right ankle 07/27/2020   History of loop recorder 05/02/2020   Carotid artery disease (HCC) 05/02/2020   Right hemiparesis (HCC) 04/30/2020   Obesity (BMI 35.0-39.9 without comorbidity) 04/30/2020   Hyperlipidemia 04/26/2019   Hypokalemia    Acute embolic stroke (HCC)     History of CVA (cerebrovascular accident) 02/11/2018   Acute kidney injury superimposed on chronic kidney disease (HCC) 02/11/2018   History of syphilis 02/11/2018   Constipation 02/11/2018   Osteoarthritis 02/11/2018   Benign essential HTN 02/11/2018   Acute encephalopathy 02/10/2018     Current Outpatient Medications on File Prior to Visit  Medication Sig Dispense Refill   amLODipine (NORVASC) 5 MG tablet Take 1 tablet (5 mg total) by mouth daily. 90 tablet 2   aspirin EC 81 MG tablet Take 1 tablet (81 mg total) by mouth daily. 90 tablet 3   atorvastatin (LIPITOR) 40 MG tablet Take 1 tablet (40 mg total) by mouth every evening. 90 tablet 2   calcium-vitamin D (OSCAL WITH D) 500-200 MG-UNIT tablet Take 1 tablet by mouth daily.     lisinopril-hydrochlorothiazide (ZESTORETIC) 20-25 MG tablet TAKE 2 TABLETS BY MOUTH DAILY FOR BLOOD PRESSURE 60 tablet 3   Multiple Vitamins-Minerals (MENS MULTIVITAMIN PLUS PO) Take 1 tablet by mouth daily.     naproxen (NAPROSYN) 250 MG tablet Take 500 mg by mouth 2 (two) times daily with a meal.     vitamin B-12 (CYANOCOBALAMIN) 500 MCG tablet Take 1 tablet (500 mcg total) by mouth daily. 100 tablet 2   amoxicillin (AMOXIL) 500 MG capsule Take 1 capsule (500 mg total) by mouth 3 (three) times daily. (Patient not taking: No sig reported) 21 capsule 0   Misc. Devices MISC Large blood pressure cuff  device For home blood pressure monitoring 1 Device 0   traMADol (ULTRAM) 50 MG tablet Take 1 tablet (50 mg total) by mouth every 6 (six) hours as needed. (Patient not taking: Reported on 01/15/2021) 20 tablet 0   No current facility-administered medications on file prior to visit.    Allergies  Allergen Reactions   Hydrocodone     Pt stated, "I am not allergic, I do not want to take this medicine ever" Codeine - "I do not want to ever take this medicine - not allergic"   Oxycodone     Pt stated, "I am not allergic, I do not want to take this medicine ever"     Social History   Socioeconomic History   Marital status: Single    Spouse name: Not on file   Number of children: Not on file   Years of education: Not on file   Highest education level: Not on file  Occupational History   Not on file  Tobacco Use   Smoking status: Former   Smokeless tobacco: Never  Vaping Use   Vaping Use: Never used  Substance and Sexual Activity   Alcohol use: Not Currently   Drug use: Not Currently    Comment: hx marijuana use   Sexual activity: Not Currently    Partners: Female    Birth control/protection: None    Comment: last encounter 2011  Other Topics Concern   Not on file  Social History Narrative   Not on file   Social Determinants of Health   Financial Resource Strain: Not on file  Food Insecurity: Not on file  Transportation Needs: Not on file  Physical Activity: Not on file  Stress: Not on file  Social Connections: Not on file  Intimate Partner Violence: Not on file    Family History  Problem Relation Age of Onset   Healthy Mother    Heart attack Father    Diabetes Brother     Past Surgical History:  Procedure Laterality Date   LOOP RECORDER IMPLANT  05/16/2019   TEE WITHOUT CARDIOVERSION N/A 02/16/2018   Procedure: TRANSESOPHAGEAL ECHOCARDIOGRAM (TEE);  Surgeon: Jodelle Red, MD;  Location: [facility] ENDOSCOPY;  Service: Cardiovascular;  Laterality: N/A;   TOOTH EXTRACTION Bilateral 10/19/2020   Procedure: DENTAL RESTORATION/EXTRACTIONS;  Surgeon: Ocie Doyne, DMD;  Location: [facility] OR;  Service: Oral Surgery;  Laterality: Bilateral;    ROS: Review of Systems Negative except as stated above  PHYSICAL EXAM: BP 111/72   Pulse (!) 53   Temp 98.2 F (36.8 C) (Oral)   Ht 5\' 8"  (1.727 m)   Wt 250 lb 6.4 oz (113.6 kg)   SpO2 100%   BMI 38.07 kg/m   Physical Exam   General appearance - alert, well appearing, obese African-American male sitting in a motorized chair and in no distress Mental status - alert, oriented  to person, place, and time Neurological -patient with grip 4/5 on the right, 5/5 on the left.  Power in the upper extremities proximally and distally 3-4/5 on the right, 5/5 on the left.  Power in the lower extremities  3/5 on the right, 4/5 on the left proximally and distally. Musculoskeletal -right shoulder: No point tenderness.  He has mild to moderate discomfort with passive range of motion in all directions.  He is wearing a Velcro cast from the knee down on the right leg and includes the foot  CMP Latest Ref Rng & Units 10/19/2020 02/02/2020 08/05/2019  Glucose 70 - 99 mg/dL  95 87 86  BUN 8 - 23 mg/dL 20 20 17   Creatinine 0.61 - 1.24 mg/dL 4.50 3.88  Sodium 135 - 145 mmol/L 140 142 139  Potassium 3.5 - 5.1 mmol/L 3.7 4.1 4.1  Chloride 98 - 111 mmol/L 104 105 103  CO2 20 - 29 mmol/L - 24 23  Calcium 8.6 - 10.2 mg/dL - 9.7 8.28  Total Protein 6.0 - 8.5 g/dL - 7.2 7.1  Total Bilirubin 0.0 - 1.2 mg/dL - 1.0 1.1  Alkaline Phos 48 - 121 IU/L - 97 115  AST 0 - 40 IU/L - 14 13  ALT 0 - 44 IU/L - 8 9   Lipid Panel     Component Value Date/Time   CHOL 123 02/02/2020 1535   TRIG 57 02/02/2020 1535   HDL 37 (L) 02/02/2020 1535   CHOLHDL 3.3 02/02/2020 1535   CHOLHDL 4.3 02/11/2018 0345   VLDL 16 02/11/2018 0345   LDLCALC 73 02/02/2020 1535    CBC    Component Value Date/Time   WBC 10.8 02/02/2020 1535   WBC 10.4 02/11/2018 0345   RBC 4.49 02/02/2020 1535   RBC 5.06 02/11/2018 0345   HGB 13.6 10/19/2020 0853   HGB 12.9 (L) 02/02/2020 1535   HCT 40.0 10/19/2020 0853   HCT 39.9 02/02/2020 1535   PLT 222 02/02/2020 1535   MCV 89 02/02/2020 1535   MCH 28.7 02/02/2020 1535   MCH 28.5 02/11/2018 0345   MCHC 32.3 02/02/2020 1535   MCHC 32.4 02/11/2018 0345   RDW 13.1 02/02/2020 1535   LYMPHSABS 2.8 02/02/2020 1535   MONOABS 0.7 02/11/2018 0345   EOSABS 0.2 02/02/2020 1535   BASOSABS 0.1 02/02/2020 1535    ASSESSMENT AND PLAN: 1. Hemiparesis affecting right side as late  effect of cerebrovascular accident (CVA) (HCC) 2. Ankle fracture, bimalleolar, closed, right, sequela I think it is reasonable for him to continue with PCS services due to residual right-sided weakness from stroke and gait disturbance associated with the stroke and his ankle fracture.  3. Chronic right shoulder pain We will get an x-ray of the shoulder to see whether he has developed some arthritis in the shoulder.  Further management will be based on results. - DG Shoulder Right; Future  4. History of syphilis -Patient requesting follow-up visit with infectious disease specialist as he was told. - Ambulatory referral to Infectious Disease    Patient was given the opportunity to ask questions.  Patient verbalized understanding of the plan and was able to repeat key elements of the plan.   Orders Placed This Encounter  Procedures   DG Shoulder Right   Ambulatory referral to Infectious Disease     Requested Prescriptions    No prescriptions requested or ordered in this encounter    No follow-ups on file.  04/03/2020, MD, FACP

## 2021-01-16 DIAGNOSIS — G8193 Hemiplegia, unspecified affecting right nondominant side: Secondary | ICD-10-CM | POA: Diagnosis not present

## 2021-01-17 DIAGNOSIS — G8193 Hemiplegia, unspecified affecting right nondominant side: Secondary | ICD-10-CM | POA: Diagnosis not present

## 2021-01-18 DIAGNOSIS — G8193 Hemiplegia, unspecified affecting right nondominant side: Secondary | ICD-10-CM | POA: Diagnosis not present

## 2021-01-21 DIAGNOSIS — G8193 Hemiplegia, unspecified affecting right nondominant side: Secondary | ICD-10-CM | POA: Diagnosis not present

## 2021-01-22 DIAGNOSIS — G8193 Hemiplegia, unspecified affecting right nondominant side: Secondary | ICD-10-CM | POA: Diagnosis not present

## 2021-01-23 DIAGNOSIS — G8193 Hemiplegia, unspecified affecting right nondominant side: Secondary | ICD-10-CM | POA: Diagnosis not present

## 2021-01-24 DIAGNOSIS — G8193 Hemiplegia, unspecified affecting right nondominant side: Secondary | ICD-10-CM | POA: Diagnosis not present

## 2021-01-25 ENCOUNTER — Other Ambulatory Visit: Payer: Self-pay

## 2021-01-25 ENCOUNTER — Ambulatory Visit: Payer: Medicaid Other | Admitting: Internal Medicine

## 2021-01-25 DIAGNOSIS — G8193 Hemiplegia, unspecified affecting right nondominant side: Secondary | ICD-10-CM | POA: Diagnosis not present

## 2021-01-25 NOTE — Patient Outreach (Signed)
Medicaid Managed Care Social Work Note  01/25/2021 Name:  Robert Donovan MRN:  [id number] DOB:  1955-06-29  Robert Donovan is an 65 y.o. year old male who is a primary patient of Robert Matar, MD.  The Medicaid Managed Care Coordination team was consulted for assistance with:  Community Resources   [name] was given information about Medicaid Managed Care Coordination team services today. Robert Donovan agreed to services and verbal consent obtained.  Engaged with patient  for by telephone forfollow up visit in response to referral for case management and/or care coordination services.   Assessments/Interventions:  Review of past medical history, allergies, medications, health status, including review of consultants reports, laboratory and other test data, was performed as part of comprehensive evaluation and provision of chronic care management services.  SDOH: (Social Determinant of Health) assessments and interventions performed:   BSW contact patient's mother Robert Donovan. She stated that patient is doing well. She states he does need someone that can lift patient so that is he able to take a bath. Patient stated someone told her about a lift. BSW will research to see if a lift is covered by Medicaid. Robert Donovan states patient is interested in going to the [facility] to workout and get an eye exam. BSW will research and provide with information.  Advanced Directives Status:  Not addressed in this encounter.  Care Plan                 Allergies  Allergen Reactions   Hydrocodone     Pt stated, "I am not allergic, I do not want to take this medicine ever" Codeine - "I do not want to ever take this medicine - not allergic"   Oxycodone     Pt stated, "I am not allergic, I do not want to take this medicine ever"    Medications Reviewed Today     Reviewed by Robert Donovan, Robert Donovan, Robert (Certified Medical Assistant) on 01/15/21 at 1121  Med List Status: <None>   Medication  Order Taking? Sig Documenting Provider Last Dose Status Informant  amLODipine (NORVASC) 5 MG tablet [id number] Yes Take 1 tablet (5 mg total) by mouth daily. Robert Matar, MD Taking Active   amoxicillin (AMOXIL) 500 MG capsule [id number] No Take 1 capsule (500 mg total) by mouth 3 (three) times daily.  Patient not taking: No sig reported   Robert Donovan, Robert Donovan Not Taking Active   aspirin EC 81 MG tablet [id number] Yes Take 1 tablet (81 mg total) by mouth daily. Robert Matar, MD Taking Active   atorvastatin (LIPITOR) 40 MG tablet [id number] Yes Take 1 tablet (40 mg total) by mouth every evening. Robert Matar, MD Taking Active   calcium-vitamin Donovan [name] WITH Donovan) 500-200 MG-UNIT tablet [id number] Yes Take 1 tablet by mouth daily. [provider] Taking Active Self  lisinopril-hydrochlorothiazide (ZESTORETIC) 20-25 MG tablet [id number] Yes TAKE 2 TABLETS BY MOUTH DAILY FOR BLOOD PRESSURE Robert Matar, MD Taking Active   Misc. Devices MISC [id number]  Large blood pressure cuff device For home blood pressure monitoring Robert Matar, MD  Active   Multiple Vitamins-Minerals (MENS MULTIVITAMIN PLUS PO) [id number] Yes Take 1 tablet by mouth daily. [provider] Taking Active Self  naproxen (NAPROSYN) 250 MG tablet [id number] Yes Take 500 mg by mouth 2 (two) times daily with a meal. [provider] Taking Active Self  traMADol (ULTRAM) 50 MG tablet [id number] No Take 1 tablet (50 mg total) by  mouth every 6 (six) hours as needed.  Patient not taking: Reported on 01/15/2021   Robert Donovan, Robert Donovan Not Taking Active            Med Note (ROBB, MELANIE A   Mon Oct 22, 2020  3:44 PM) Rochele Pages one time, has not needed since  vitamin B-12 (CYANOCOBALAMIN) 500 MCG tablet [id number] Yes Take 1 tablet (500 mcg total) by mouth daily. Robert Matar, MD Taking Active             Patient Active Problem List   Diagnosis Date Noted   Hemiparesis affecting right side as late  effect of cerebrovascular accident (CVA) (HCC) 09/06/2020   Personal history of fall 09/06/2020   Chronic right shoulder pain 09/06/2020   Closed bimalleolar fracture of right ankle 07/27/2020   History of loop recorder 05/02/2020   Carotid artery disease (HCC) 05/02/2020   Right hemiparesis (HCC) 04/30/2020   Obesity (BMI 35.0-39.9 without comorbidity) 04/30/2020   Hyperlipidemia 04/26/2019   Hypokalemia    Acute embolic stroke [facility])    History of CVA (cerebrovascular accident) 02/11/2018   Acute kidney injury superimposed on chronic kidney disease (HCC) 02/11/2018   History of syphilis 02/11/2018   Constipation 02/11/2018   Osteoarthritis 02/11/2018   Benign essential HTN 02/11/2018   Acute encephalopathy 02/10/2018    Conditions to be addressed/monitored per PCP order:   resources  There are no care plans that you recently modified to display for this patient.   Follow up:  Patient agrees to Care Plan and Follow-up.  Plan: The Managed Medicaid care management team will reach out to the patient again over the next 5 days.  Date/time of next scheduled Social Work care management/care coordination outreach:  02/01/21  [name], Kenard Gower, [facility]  [facility]  High Risk Managed Medicaid Team  [phone]

## 2021-01-25 NOTE — Patient Instructions (Signed)
Visit Information  [name] was given information about Medicaid Managed Care team care coordination services as a part of their [place] [facility] Medicaid benefit. [name] verbally consented to engagement with the [facility] Managed Care team.   If you are experiencing a medical emergency, please call 911 or report to your local emergency department or urgent care.   If you have a non-emergency medical problem during routine business hours, please contact your provider's office and ask to speak with a nurse.   For questions related to your [facility], please call: [phone] or visit the homepage here: [email or web address]  If you would like to schedule transportation through your [facility], please call the following number at least 2 days in advance of your appointment: [phone].   Call the Behavioral Health Crisis Line at [phone]) [phone], at any time, 24 hours a day, 7 days a week. If you are in danger or need immediate medical attention call 911.  If you would like help to quit smoking, call 1-800-QUIT-NOW (681-443-2025) OR Espaol: 1-855-Djelo-Ya (0-254-270-6237) o para ms informacin haga clic aqu or Text READY to 628-315 to register via text  [name] - following are the goals we discussed in your visit today:   Goals Addressed   None       Social Worker will follow up in 5 days.   [name], BSW, [place] [facility]  [place]  High Risk Managed Medicaid Team  [phone]   Following is a copy of your plan of care:

## 2021-01-28 ENCOUNTER — Other Ambulatory Visit: Payer: Self-pay

## 2021-01-28 ENCOUNTER — Other Ambulatory Visit: Payer: Self-pay | Admitting: *Deleted

## 2021-01-28 DIAGNOSIS — G8193 Hemiplegia, unspecified affecting right nondominant side: Secondary | ICD-10-CM | POA: Diagnosis not present

## 2021-01-28 NOTE — Patient Outreach (Signed)
Medicaid Managed Care   Nurse Care Manager Note  01/28/2021 Name:  Robert Donovan MRN:  [id number] DOB:  1956-02-13  Robert Donovan is an 65 y.o. year old male who is a primary patient of [name], MD.  The [facility] Managed Care Coordination team was consulted for assistance with:    HTN Mobility deficits  Robert Donovan was given information about Medicaid Managed Care Coordination team services today. Robert Donovan agreed to services and verbal consent obtained.  Engaged with patient by telephone for follow up visit in response to provider referral for case management and/or care coordination services.   Assessments/Interventions:  Review of past medical history, allergies, medications, health status, including review of consultants reports, laboratory and other test data, was performed as part of comprehensive evaluation and provision of chronic care management services.  SDOH (Social Determinants of Health) assessments and interventions performed: SDOH Interventions    Flowsheet Row Most Recent Value  SDOH Interventions   Transportation Interventions Intervention Not Indicated       Care Plan  Allergies  Allergen Reactions   Hydrocodone     Pt stated, "I am not allergic, I do not want to take this medicine ever" Codeine - "I do not want to ever take this medicine - not allergic"   Oxycodone     Pt stated, "I am not allergic, I do not want to take this medicine ever"    Medications Reviewed Today     Reviewed by [name], RN (Registered Nurse) on 01/28/21 at 1509  Med List Status: <None>   Medication Order Taking? Sig Documenting Provider Last Dose Status Informant  amLODipine (NORVASC) 5 MG tablet [id number] No Take 1 tablet (5 mg total) by mouth daily. [name], MD Taking Active   amoxicillin (AMOXIL) 500 MG capsule [id number] No Take 1 capsule (500 mg total) by mouth 3 (three) times daily.  Patient not taking: No sig reported   Ocie Doyne,  DMD Not Taking Active   aspirin EC 81 MG tablet [id number] No Take 1 tablet (81 mg total) by mouth daily. [name], MD Taking Active   atorvastatin (LIPITOR) 40 MG tablet [id number] No Take 1 tablet (40 mg total) by mouth every evening. [name], MD Taking Active   calcium-vitamin D (OSCAL WITH D) 500-200 MG-UNIT tablet [id number] No Take 1 tablet by mouth daily. [provider] Taking Active Self  lisinopril-hydrochlorothiazide (ZESTORETIC) 20-25 MG tablet [id number] No TAKE 2 TABLETS BY MOUTH DAILY FOR BLOOD PRESSURE [name], MD Taking Active   Misc. Devices MISC [id number]  Large blood pressure cuff device For home blood pressure monitoring [name], MD  Active   Multiple Vitamins-Minerals (MENS MULTIVITAMIN PLUS PO) [id number] No Take 1 tablet by mouth daily. [provider] Taking Active Self  naproxen (NAPROSYN) 250 MG tablet [id number] No Take 500 mg by mouth 2 (two) times daily with a meal. [provider] Taking Active Self  traMADol (ULTRAM) 50 MG tablet [id number] No Take 1 tablet (50 mg total) by mouth every 6 (six) hours as needed.  Patient not taking: Reported on 01/15/2021   Ocie Doyne, DMD Not Taking Active            Med Note (Robert Donovan A   Mon Oct 22, 2020  3:44 PM) Rochele Pages one time, has not needed since  vitamin B-12 (CYANOCOBALAMIN) 500 MCG tablet [id number] No Take 1 tablet (500 mcg total) by mouth daily. [name], MD  Taking Active             Patient Active Problem List   Diagnosis Date Noted   Hemiparesis affecting right side as late effect of cerebrovascular accident (CVA) (HCC) 09/06/2020   Personal history of fall 09/06/2020   Chronic right shoulder pain 09/06/2020   Closed bimalleolar fracture of right ankle 07/27/2020   History of loop recorder 05/02/2020   Carotid artery disease (HCC) 05/02/2020   Right hemiparesis (HCC) 04/30/2020   Obesity (BMI 35.0-39.9 without comorbidity)  04/30/2020   Hyperlipidemia 04/26/2019   Hypokalemia    Acute embolic stroke (HCC)    History of CVA (cerebrovascular accident) 02/11/2018   Acute kidney injury superimposed on chronic kidney disease (HCC) 02/11/2018   History of syphilis 02/11/2018   Constipation 02/11/2018   Osteoarthritis 02/11/2018   Benign essential HTN 02/11/2018   Acute encephalopathy 02/10/2018    Conditions to be addressed/monitored per PCP order:  HTN and mobility deficits  Care Plan : General Plan of Care (Adult)  Updates made by [name], RN since 01/28/2021 12:00 AM     Problem: Health Promotion or Disease Self-Management (General Plan of Care)      Long-Range Goal: Self-Management Plan Developed   Start Date: 10/22/2020  Expected End Date: 03/04/2021  Recent Progress: On track  Priority: High  Note:   Current Barriers:  Ineffective Self Health Maintenance-RNCM was unable to reach Robert Donovan, spoke with patient's DPR Robert Donovan. Robert Donovan helps Robert Donovan manage his healthcare at home. 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 He fell while using a walker and broke his foot.  His short term memory is declining and he forgets to take his medications and gets confused as to what he is suppose to take and when. He has an aide that comes in 3 times a week to assist with bathing and dressing. Robert Donovan is in need of an eye exam. Robert Donovan has questions regarding the patients BP medications and dosage, progress of referral for motorized wheelchair. Robert Donovan had all of his teeth pulled last week and will be getting dentures in the future.-Update-[name] and DPR, Robert Donovan, reports [employer] denied motorized wheelchair and an appeal has been filed. An eye exam has not been scheduled at this time. A referral for PT has been placed by PCP-needs to be scheduled. Robert Donovan is having great success taking prepackaged medications via [facility]. Unable to self administer  medications as prescribed Unable to perform ADLs independently Does not contact provider office for questions/concerns Currently UNABLE TO independently self manage needs related to chronic health conditions.  Knowledge Deficits related to short term plan for care coordination needs and long term plans for chronic disease management needs Nurse Case Manager Clinical Goal(s):  patient will work with care management team to address care coordination and chronic disease management needs related to Disease Management   Interventions:  Evaluation of current treatment plan related to HTN and mobility and patient's adherence to plan as established by provider. Advised patient/DPR to contact PCP provider with any questions and concerns Discussed plans with patient/DPR for ongoing care management follow up and provided patient with direct contact information for care management team Advised patient/DPR, providing education and rationale, to monitor blood pressure daily and record, calling PCP for findings outside established parameters.  Provided patient/ and/or caregiver with contact information for [facility] [phone], and Outpatient Rehab [phone] to call and schedule an appointment Reviewed scheduled/upcoming provider  appointments  Provided education on heart healthy diet  Self Care Activities:  Patient will self administer medications as prescribed Patient will attend all scheduled provider appointments Patient will attend church or other social activities Patient will call provider office for new concerns or questions Patient will work with BSW to address care coordination needs and will continue to work with the clinical team to address health care and disease management related needs.   Patient will work with MM Pharmacist, Harrold Donath for medication management Patient Goals: - Contact [employer] [phone] for Wellness Benefits - begin a notebook of services in my neighborhood or  community - follow-up on any referrals for help I am given - call [facility] [phone] to schedule an eye appointment(referral placed by Dr. Laural Benes) - call Outpatient Rehab [phone] to schedule PT appointment(referral placed by Dr. Laural Benes) - attend all scheduled appointments, call to schedule follow up with PCP - arrange a ride through an agency 1 week before appointment - call to cancel if needed - keep a calendar with prescription refill dates - keep a calendar with appointment dates  - Contact provider office with any questions or concerns Follow Up Plan: Telephone follow up appointment with care management team member scheduled for:03/04/21 @ 2:30pm      Follow Up:  Patient agrees to Care Plan and Follow-up.  Plan: The Managed Medicaid care management team will reach out to the patient again over the next 30 days.  Date/time of next scheduled RN care management/care coordination outreach:  03/04/21 @ 3:30pm  Estanislado Emms RN, BSN [place]  [facility] RN Care Coordinator

## 2021-01-28 NOTE — Patient Instructions (Addendum)
Visit Information  Mr. Calbert's DPR, [name] was given information about Medicaid Managed Care team care coordination services as a part of their [facility] Community Plan Medicaid benefit. Perley Jain DPR verbally consented to engagement with the [facility] Managed Care team.   If you are experiencing a medical emergency, please call 911 or report to your local emergency department or urgent care.   If you have a non-emergency medical problem during routine business hours, please contact your provider's office and ask to speak with a nurse.   For questions related to your [facility], please call: [phone] or visit the homepage here: [email or web address]  If you would like to schedule transportation through your [facility], please call the following number at least 2 days in advance of your appointment: [phone].   Call the Behavioral Health Crisis Line at [phone], at any time, 24 hours a day, 7 days a week. If you are in danger or need immediate medical attention call 911.  If you would like help to quit smoking, call 1-800-QUIT-NOW (562-152-5394) OR Espaol: 1-855-Djelo-Ya (9-390-300-9233) o para ms informacin haga clic aqu or Text READY to 007-622 to register via text  [name] - following are the goals we discussed in your visit today:   Goals Addressed             This Visit's Progress    Find Help in My Community       Timeframe:  Long-Range Goal Priority:  High Start Date:   10/22/20                          Expected End Date:  03/04/21                    Follow Up Date 03/04/21   - Contact [employer]HC [phone] for Wellness Benefits - begin a notebook of services in my neighborhood or community - follow-up on any referrals for help I am given    Why is this important?   Knowing how and where to find help for yourself or family in your  neighborhood and community is an important skill.  You will want to take some steps to learn how.         Make and Keep All Appointments       Timeframe:  Long-Range Goal Priority:  High Start Date:   10/22/20                          Expected End Date: 03/04/21                      Follow Up Date 03/04/21  - call [facility] [phone] to schedule an eye appointment(referral placed by Dr. Laural Benes) - call Outpatient Rehab [phone] to schedule PT appointment(referral placed by Dr. Laural Benes) - attend all scheduled appointments, call to schedule follow up with PCP - arrange a ride through an agency 1 week before appointment - call to cancel if needed - keep a calendar with prescription refill dates - keep a calendar with appointment dates  - Contact provider office with any questions or concerns   Why is this important?   Part of staying healthy is seeing the doctor for follow-up care.  If you forget your appointments, there are some things you can do to stay on track.  Please see education materials related to HTN and diet provided by MyChart link. and as Financial risk analyst.   The patient verbalized understanding of instructions provided today and agreed to receive a mailed copy of patient instruction and/or educational materials.  Telephone follow up appointment with Managed Medicaid care management team member scheduled for:03/04/21 @ 3:30pm  Estanislado Emms RN, BSN [place]  [facility] RN Care Coordinator   Following is a copy of your plan of care:  Patient Care Plan: General Plan of Care (Adult)     Problem Identified: Health Promotion or Disease Self-Management (General Plan of Care)      Long-Range Goal: Self-Management Plan Developed   Start Date: 10/22/2020  Expected End Date: 03/04/2021  Recent Progress: On track  Priority: High  Note:   Current Barriers:  Ineffective Self Health Maintenance-RNCM was unable to reach Mr. Gritton,  spoke with patient's DPR [name]. [name] helps Mr. Conwell manage his healthcare at home. Mr. Lukes had a stroke which has left him with right sided weakness. He fell while using a walker and broke his foot.  His short term memory is declining and he forgets to take his medications and gets confused as to what he is suppose to take and when. He has an aide that comes in 3 times a week to assist with bathing and dressing. Mr. Antuna is in need of an eye exam. [name] has questions regarding the patients BP medications and dosage, progress of referral for motorized wheelchair. Mr. Rafter had all of his teeth pulled last week and will be getting dentures in the future.-Update-[name] and DPR, [name], reports [employer] denied motorized wheelchair and an appeal has been filed. An eye exam has not been scheduled at this time. A referral for PT has been placed by PCP-needs to be scheduled. Mr. Polgar is having great success taking prepackaged medications via [facility]. Unable to self administer medications as prescribed Unable to perform ADLs independently Does not contact provider office for questions/concerns Currently UNABLE TO independently self manage needs related to chronic health conditions.  Knowledge Deficits related to short term plan for care coordination needs and long term plans for chronic disease management needs Nurse Case Manager Clinical Goal(s):  patient will work with care management team to address care coordination and chronic disease management needs related to Disease Management   Interventions:  Evaluation of current treatment plan related to HTN and mobility and patient's adherence to plan as established by provider. Advised patient/DPR to contact PCP provider with any questions and concerns Discussed plans with patient/DPR for ongoing care management follow up and provided patient with direct contact information for care management team Advised patient/DPR,  providing education and rationale, to monitor blood pressure daily and record, calling PCP for findings outside established parameters.  Provided patient/ and/or caregiver with contact information for [facility] [phone], and Outpatient Rehab [phone] to call and schedule an appointment Reviewed scheduled/upcoming provider appointments  Provided education on heart healthy diet  Self Care Activities:  Patient will self administer medications as prescribed Patient will attend all scheduled provider appointments Patient will attend church or other social activities Patient will call provider office for new concerns or questions Patient will work with BSW to address care coordination needs and will continue to work with the clinical team to address health care and disease management related needs.   Patient will work with MM Pharmacist, Harrold Donath for medication management Patient Goals: - Contact [employer] [phone] for Wellness Benefits -  begin a notebook of services in my neighborhood or community - follow-up on any referrals for help I am given - call [facility] [phone] to schedule an eye appointment(referral placed by Dr. Laural Benes) - call Outpatient Rehab [phone] to schedule PT appointment(referral placed by Dr. Laural Benes) - attend all scheduled appointments, call to schedule follow up with PCP - arrange a ride through an agency 1 week before appointment - call to cancel if needed - keep a calendar with prescription refill dates - keep a calendar with appointment dates  - Contact provider office with any questions or concerns Follow Up Plan: Telephone follow up appointment with care management team member scheduled for:03/04/21 @ 2:30pm     Patient Care Plan: Medication Management     Problem Identified: Health Promotion or Disease Self-Management (General Plan of Care)      Goal: Medication Management   Note:   Current Barriers:  Unable to self administer  medications as prescribed Does not adhere to prescribed medication regimen Does not maintain contact with provider office Does not contact provider office for questions/concerns   Pharmacist Clinical Goal(s):  Over the next 30 days, patient will contact provider office for questions/concerns as evidenced notation of same in electronic health record through collaboration with PharmD and provider.    Interventions: Inter-disciplinary care team collaboration (see longitudinal plan of care) Comprehensive medication review performed; medication list updated in electronic medical record    Patient Goals/Self-Care Activities Over the next 30 days, patient will:  - collaborate with provider on medication access solutions  Follow Up Plan: The care management team will reach out to the patient again over the next 30 days.

## 2021-01-29 DIAGNOSIS — G8193 Hemiplegia, unspecified affecting right nondominant side: Secondary | ICD-10-CM | POA: Diagnosis not present

## 2021-01-30 ENCOUNTER — Ambulatory Visit: Payer: Medicaid Other | Admitting: Podiatry

## 2021-01-30 DIAGNOSIS — G8193 Hemiplegia, unspecified affecting right nondominant side: Secondary | ICD-10-CM | POA: Diagnosis not present

## 2021-01-31 DIAGNOSIS — R531 Weakness: Secondary | ICD-10-CM | POA: Diagnosis not present

## 2021-01-31 DIAGNOSIS — G8193 Hemiplegia, unspecified affecting right nondominant side: Secondary | ICD-10-CM | POA: Diagnosis not present

## 2021-02-01 ENCOUNTER — Other Ambulatory Visit: Payer: Self-pay

## 2021-02-01 DIAGNOSIS — G8193 Hemiplegia, unspecified affecting right nondominant side: Secondary | ICD-10-CM | POA: Diagnosis not present

## 2021-02-01 NOTE — Patient Outreach (Signed)
Medicaid Managed Care Social Work Note  02/01/2021 Name:  Robert Donovan MRN:  [id number] DOB:  1956/04/19  Robert Donovan is an 65 y.o. year old male who is a primary patient of [name], MD.  The [facility] Managed Care Coordination team was consulted for assistance with:   lift, [employer], and eye exam  Robert Donovan was given information about Medicaid Managed Care Coordination team services today. Robert Donovan agreed to services and verbal consent obtained.  Engaged with patient  for by telephone forfollow up visit in response to referral for case management and/or care coordination services.   Assessments/Interventions:  Review of past medical history, allergies, medications, health status, including review of consultants reports, laboratory and other test data, was performed as part of comprehensive evaluation and provision of chronic care management services.  SDOH: (Social Determinant of Health) assessments and interventions performed:  BSW contacted patient's mom [name], she stated patient was able to get in the tub and out and bath himself. Mom states the lift is no longer needed. BSW informed mom that patient can go to [employer] for his eye exam and complete an application with the [employer] and turn it in to their closest [facility]. Mom stated she would like to go over all of patients medications with Harrold Donath or patients PCP.   Advanced Directives Status:  Not addressed in this encounter.  Care Plan                 Allergies  Allergen Reactions   Hydrocodone     Pt stated, "I am not allergic, I do not want to take this medicine ever" Codeine - "I do not want to ever take this medicine - not allergic"   Oxycodone     Pt stated, "I am not allergic, I do not want to take this medicine ever"    Medications Reviewed Today     Reviewed by [name], RN (Registered Nurse) on 01/28/21 at 1509  Med List Status: <None>   Medication Order Taking? Sig Documenting Provider  Last Dose Status Informant  amLODipine (NORVASC) 5 MG tablet [id number] No Take 1 tablet (5 mg total) by mouth daily. [name], MD Taking Active   amoxicillin (AMOXIL) 500 MG capsule [id number] No Take 1 capsule (500 mg total) by mouth 3 (three) times daily.  Patient not taking: No sig reported   Ocie Doyne, DMD Not Taking Active   aspirin EC 81 MG tablet [id number] No Take 1 tablet (81 mg total) by mouth daily. [name], MD Taking Active   atorvastatin (LIPITOR) 40 MG tablet [id number] No Take 1 tablet (40 mg total) by mouth every evening. [name], MD Taking Active   calcium-vitamin D (OSCAL WITH D) 500-200 MG-UNIT tablet [id number] No Take 1 tablet by mouth daily. [provider] Taking Active Self  lisinopril-hydrochlorothiazide (ZESTORETIC) 20-25 MG tablet [id number] No TAKE 2 TABLETS BY MOUTH DAILY FOR BLOOD PRESSURE [name], MD Taking Active   Misc. Devices MISC [id number]  Large blood pressure cuff device For home blood pressure monitoring [name], MD  Active   Multiple Vitamins-Minerals (MENS MULTIVITAMIN PLUS PO) [id number] No Take 1 tablet by mouth daily. [provider] Taking Active Self  naproxen (NAPROSYN) 250 MG tablet [id number] No Take 500 mg by mouth 2 (two) times daily with a meal. [provider] Taking Active Self  traMADol (ULTRAM) 50 MG tablet [id number] No Take 1 tablet (50 mg total) by mouth every  6 (six) hours as needed.  Patient not taking: Reported on 01/15/2021   Ocie Doyne, DMD Not Taking Active            Med Note (ROBB, MELANIE A   Mon Oct 22, 2020  3:44 PM) Rochele Pages one time, has not needed since  vitamin B-12 (CYANOCOBALAMIN) 500 MCG tablet [id number] No Take 1 tablet (500 mcg total) by mouth daily. [name], MD Taking Active             Patient Active Problem List   Diagnosis Date Noted   Hemiparesis affecting right side as late effect of cerebrovascular accident (CVA) (HCC)  09/06/2020   Personal history of fall 09/06/2020   Chronic right shoulder pain 09/06/2020   Closed bimalleolar fracture of right ankle 07/27/2020   History of loop recorder 05/02/2020   Carotid artery disease (HCC) 05/02/2020   Right hemiparesis (HCC) 04/30/2020   Obesity (BMI 35.0-39.9 without comorbidity) 04/30/2020   Hyperlipidemia 04/26/2019   Hypokalemia    Acute embolic stroke [facility])    History of CVA (cerebrovascular accident) 02/11/2018   Acute kidney injury superimposed on chronic kidney disease (HCC) 02/11/2018   History of syphilis 02/11/2018   Constipation 02/11/2018   Osteoarthritis 02/11/2018   Benign essential HTN 02/11/2018   Acute encephalopathy 02/10/2018    Conditions to be addressed/monitored per PCP order:   lift, [employer], eye exam  There are no care plans that you recently modified to display for this patient.   Follow up:  Patient agrees to Care Plan and Follow-up.  Plan: The Managed Medicaid care management team will reach out to the patient again over the next 30 days.  Date/time of next scheduled Social Work care management/care coordination outreach:  03/04/21  [name], Kenard Gower, [facility]  [facility]  High Risk Managed Medicaid Team  [phone]

## 2021-02-01 NOTE — Patient Instructions (Signed)
Visit Information  [name] was given information about Medicaid Managed Care team care coordination services as a part of their [facility] Community Plan Medicaid benefit. [name] verbally consented to engagement with the [facility] Managed Care team.   If you are experiencing a medical emergency, please call 911 or report to your local emergency department or urgent care.   If you have a non-emergency medical problem during routine business hours, please contact your provider's office and ask to speak with a nurse.   For questions related to your [facility], please call: [phone] or visit the homepage here: [email or web address]  If you would like to schedule transportation through your [facility], please call the following number at least 2 days in advance of your appointment: [phone].   Call the Behavioral Health Crisis Line at [phone], at any time, 24 hours a day, 7 days a week. If you are in danger or need immediate medical attention call 911.  If you would like help to quit smoking, call 1-800-QUIT-NOW (548-321-8198) OR Espaol: 1-855-Djelo-Ya (7-253-664-4034) o para ms informacin haga clic aqu or Text READY to 742-595 to register via text  [name] - following are the goals we discussed in your visit today:   Goals Addressed   None     Social Worker will follow up in 30 days.   [name], BSW, [place] [facility]  [place]  High Risk Managed Medicaid Team  [phone]   Following is a copy of your plan of care:

## 2021-02-02 NOTE — Progress Notes (Signed)
Carelink Summary Report / Loop Recorder 

## 2021-02-04 DIAGNOSIS — G8193 Hemiplegia, unspecified affecting right nondominant side: Secondary | ICD-10-CM | POA: Diagnosis not present

## 2021-02-05 DIAGNOSIS — G8193 Hemiplegia, unspecified affecting right nondominant side: Secondary | ICD-10-CM | POA: Diagnosis not present

## 2021-02-06 ENCOUNTER — Ambulatory Visit (INDEPENDENT_AMBULATORY_CARE_PROVIDER_SITE_OTHER): Payer: Medicaid Other | Admitting: Internal Medicine

## 2021-02-06 ENCOUNTER — Other Ambulatory Visit (HOSPITAL_COMMUNITY): Payer: Self-pay

## 2021-02-06 ENCOUNTER — Other Ambulatory Visit: Payer: Self-pay

## 2021-02-06 VITALS — BP 105/68 | HR 76 | Resp 16 | Ht 68.0 in | Wt 250.0 lb

## 2021-02-06 DIAGNOSIS — G8193 Hemiplegia, unspecified affecting right nondominant side: Secondary | ICD-10-CM | POA: Diagnosis not present

## 2021-02-06 DIAGNOSIS — A539 Syphilis, unspecified: Secondary | ICD-10-CM

## 2021-02-06 NOTE — Progress Notes (Signed)
[facility]  Patient Active Problem List   Diagnosis Date Noted   Hemiparesis affecting right side as late effect of cerebrovascular accident (CVA) (HCC) 09/06/2020   Personal history of fall 09/06/2020   Chronic right shoulder pain 09/06/2020   Closed bimalleolar fracture of right ankle 07/27/2020   History of loop recorder 05/02/2020   Carotid artery disease (HCC) 05/02/2020   Right hemiparesis (HCC) 04/30/2020   Obesity (BMI 35.0-39.9 without comorbidity) 04/30/2020   Hyperlipidemia 04/26/2019   Hypokalemia    Acute embolic stroke (HCC)    History of CVA (cerebrovascular accident) 02/11/2018   Acute kidney injury superimposed on chronic kidney disease (HCC) 02/11/2018   History of syphilis 02/11/2018   Constipation 02/11/2018   Osteoarthritis 02/11/2018   Benign essential HTN 02/11/2018   Acute encephalopathy 02/10/2018      Subjective:    Patient ID: Robert Donovan, male    DOB: 10-Nov-1955, 65 y.o.   MRN: [id number]  Chief Complaint  Patient presents with   New Patient (Initial Visit)    Seen in past ar [facility] for Syphilis needs labs to make sure 3 doses of Bicillin worked.     HPI:  Robert Donovan is a 65 y.o. 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 No further exposure/risk for syphlis   Allergies  Allergen Reactions   Hydrocodone     Pt stated, "I am not allergic, I do not want to take this medicine ever" Codeine - "I do not want to ever take this medicine - not allergic"   Oxycodone     Pt stated, "I am not allergic, I do not want to take this medicine ever"      Outpatient Medications Prior to Visit  Medication Sig Dispense Refill   amLODipine (NORVASC) 5 MG tablet Take 1 tablet (5 mg total) by mouth daily. 90 tablet 2   aspirin EC 81 MG tablet Take 1 tablet (81 mg total) by mouth daily. 90 tablet 3   atorvastatin (LIPITOR) 40 MG  tablet Take 1 tablet (40 mg total) by mouth every evening. 90 tablet 2   calcium-vitamin D (OSCAL WITH D) 500-200 MG-UNIT tablet Take 1 tablet by mouth daily.     lisinopril-hydrochlorothiazide (ZESTORETIC) 20-25 MG tablet TAKE 2 TABLETS BY MOUTH DAILY FOR BLOOD PRESSURE 60 tablet 3   Misc. Devices MISC Large blood pressure cuff device For home blood pressure monitoring 1 Device 0   Multiple Vitamins-Minerals (MENS MULTIVITAMIN PLUS PO) Take 1 tablet by mouth daily.     naproxen (NAPROSYN) 250 MG tablet Take 500 mg by mouth 2 (two) times daily with a meal.     vitamin B-12 (CYANOCOBALAMIN) 500 MCG tablet Take 1 tablet (500 mcg total) by mouth daily. 100 tablet 2   amoxicillin (AMOXIL) 500 MG capsule Take 1 capsule (500 mg total) by mouth 3 (three) times daily. (Patient not taking: No sig reported) 21 capsule 0   traMADol (ULTRAM) 50 MG tablet Take 1 tablet (50 mg total) by mouth every 6 (six) hours as needed. (Patient not taking: Reported on 01/15/2021) 20 tablet 0   No facility-administered medications prior to visit.     Social History   Socioeconomic History   Marital status: Single    Spouse name: Not on file   Number of children: Not on file   Years of education:  Not on file   Highest education level: Not on file  Occupational History   Not on file  Tobacco Use   Smoking status: Former   Smokeless tobacco: Never  Vaping Use   Vaping Use: Never used  Substance and Sexual Activity   Alcohol use: Not Currently   Drug use: Not Currently    Comment: hx marijuana use   Sexual activity: Not Currently    Partners: Female    Birth control/protection: None    Comment: last encounter 2011  Other Topics Concern   Not on file  Social History Narrative   Not on file   Social Determinants of Health   Financial Resource Strain: Not on file  Food Insecurity: Not on file  Transportation Needs: No Transportation Needs   Lack of Transportation (Medical): No   Lack of Transportation  (Non-Medical): No  Physical Activity: Not on file  Stress: Not on file  Social Connections: Not on file  Intimate Partner Violence: Not on file      Review of Systems    All other ros negative Objective:    BP 105/68   Pulse 76   Resp 16   Ht 5\' 8"  (1.727 m)   Wt 250 lb (113.4 kg)   SpO2 97%   BMI 38.01 kg/m  Nursing note and vital signs reviewed.  Physical Exam     General/constitutional: no distress, pleasant HEENT: Normocephalic, PER, Conj Clear, EOMI, Oropharynx clear Neck supple CV: rrr no mrg Lungs: clear to auscultation, normal respiratory effort Abd: Soft, Nontender Ext: no edema Skin: No Rash Neuro: nonfocal; right hemiparesis MSK: no peripheral joint swelling/tenderness/warmth; back spines nontender   Labs:  Micro:  Serology:  Imaging:  Assessment & Plan:   Problem List Items Addressed This Visit   None Visit Diagnoses     Syphilis    -  Primary   Relevant Orders   RPR       Discussed natural history of RPR titer  Patient had late latent tx 04/2020 for persistent titer 1:2, anticipate possibility of serofast.    -rpr today -no need for id f/u   I have spent a total of 20 minutes of face-to-face and non-face-to-face time, excluding clinical staff time, preparing to see patient, ordering tests and/or medications, and provide counseling the patient     Follow-up: No follow-ups on file.      05/2020, MD [facility] for Infectious Disease [facility] [phone]  pager   [phone] cell 02/06/2021, 9:30 AM

## 2021-02-07 ENCOUNTER — Ambulatory Visit: Payer: Medicaid Other | Admitting: Physical Therapy

## 2021-02-07 ENCOUNTER — Telehealth: Payer: Self-pay

## 2021-02-07 DIAGNOSIS — G8193 Hemiplegia, unspecified affecting right nondominant side: Secondary | ICD-10-CM | POA: Diagnosis not present

## 2021-02-07 NOTE — Telephone Encounter (Signed)
justification for power chair from PT signed by Dr Laural Benes and faxed to [facility] # [phone].

## 2021-02-08 DIAGNOSIS — G8193 Hemiplegia, unspecified affecting right nondominant side: Secondary | ICD-10-CM | POA: Diagnosis not present

## 2021-02-08 LAB — RPR TITER: RPR Titer: 1:1 {titer} — ABNORMAL HIGH

## 2021-02-08 LAB — FLUORESCENT TREPONEMAL AB(FTA)-IGG-BLD: Fluorescent Treponemal ABS: REACTIVE — AB

## 2021-02-08 LAB — RPR: RPR Ser Ql: REACTIVE — AB

## 2021-02-11 ENCOUNTER — Ambulatory Visit: Payer: Medicaid Other | Attending: Internal Medicine

## 2021-02-11 ENCOUNTER — Other Ambulatory Visit: Payer: Self-pay

## 2021-02-11 DIAGNOSIS — M6281 Muscle weakness (generalized): Secondary | ICD-10-CM | POA: Insufficient documentation

## 2021-02-11 DIAGNOSIS — R269 Unspecified abnormalities of gait and mobility: Secondary | ICD-10-CM | POA: Diagnosis present

## 2021-02-11 DIAGNOSIS — R2681 Unsteadiness on feet: Secondary | ICD-10-CM | POA: Diagnosis not present

## 2021-02-11 DIAGNOSIS — G8193 Hemiplegia, unspecified affecting right nondominant side: Secondary | ICD-10-CM | POA: Diagnosis not present

## 2021-02-12 DIAGNOSIS — G8193 Hemiplegia, unspecified affecting right nondominant side: Secondary | ICD-10-CM | POA: Diagnosis not present

## 2021-02-13 DIAGNOSIS — G8193 Hemiplegia, unspecified affecting right nondominant side: Secondary | ICD-10-CM | POA: Diagnosis not present

## 2021-02-13 NOTE — Therapy (Signed)
[facility] [address] [place], [place], [address] Phone: [phone]   Fax:  [phone]  Physical Therapy Evaluation  Patient Details  Name: Robert Donovan MRN: [id number] Date of Birth: 1956/05/30 Referring Provider (PT): Jonah Blue MD   Encounter Date: 02/11/2021   02/11/21 1519  PT Visits / Re-Eval  Visit Number 1  Number of Visits 17  Date for PT Re-Evaluation 04/15/21  Authorization  Authorization Type [employer] MCD  Authorization Time Period 27 visit limit combined disciplines  Authorization - Visit Number 27  Progress Note Due on Visit 10  PT Time Calculation  PT Start Time 1100  PT Stop Time 1145  PT Time Calculation (min) 45 min  PT - End of Session  Equipment Utilized During Treatment Gait belt  Activity Tolerance Patient tolerated treatment well  Behavior During Therapy [facility] for tasks assessed/performed     Past Medical History:  Diagnosis Date   Arthritis    Bilateral Knees   High cholesterol    History of loop recorder 05/16/2019   Hypertension    Stroke (HCC) 02/10/2018    Past Surgical History:  Procedure Laterality Date   LOOP RECORDER IMPLANT  05/16/2019   TEE WITHOUT CARDIOVERSION N/A 02/16/2018   Procedure: TRANSESOPHAGEAL ECHOCARDIOGRAM (TEE);  Surgeon: Jodelle Red, MD;  Location: [facility] ENDOSCOPY;  Service: Cardiovascular;  Laterality: N/A;   TOOTH EXTRACTION Bilateral 10/19/2020   Procedure: DENTAL RESTORATION/EXTRACTIONS;  Surgeon: Ocie Doyne, DMD;  Location: [facility] OR;  Service: Oral Surgery;  Laterality: Bilateral;    There were no vitals filed for this visit.    02/11/21 1109  Symptoms/Limitations  Subjective Incurred a CVA on 02/20/19(chart reports 02/11/18) and has had R side weakness since.  R ankle fx following CVA but has healed and released by ortho to resume mobility in shoe however he is reluctant to DC his CAM boot.  Goal of returning to PLOF cited  Pertinent  History 1. Hemiparesis affecting right side as late effect of cerebrovascular accident (CVA) (HCC)  2. Ankle fracture, bimalleolar, closed, right, sequela  I think it is reasonable for him to continue with PCS services due to residual right-sided weakness from stroke and gait disturbance associated with the stroke and his ankle fracture.     3. Chronic right shoulder pain  We will get an x-ray of the shoulder to see whether he has developed some arthritis in the shoulder.  Further management will be based on results.  - DG Shoulder Right; Future     4. History of syphilis  -Patient requesting follow-up visit with infectious disease specialist as he was told.  - Ambulatory referral to Infectious Disease  Limitations Walking;Standing  How long can you sit comfortably? >15 min  How long can you stand comfortably? <5 min  How long can you walk comfortably? n/a  Patient Stated Goals To improve my mobility  Pain Assessment  Currently in Pain? No/denies       02/11/21 0001  Assessment  Medical Diagnosis CVA  Referring Provider (PT) Jonah Blue MD  Onset Date/Surgical Date 02/12/19  Prior Therapy HH  Precautions  Precautions Fall  Required Braces or Orthoses Other Brace/Splint  Other Brace/Splint R walking boot  Restrictions  Weight Bearing Restrictions No  Balance Screen  Has the patient fallen in the past 6 months No  Has the patient had a decrease in activity level because of a fear of falling?  No  Is the patient reluctant to leave their home because of  a fear of falling?  No  Home Environment  Living Environment Private residence  Living Arrangements Alone  Type of Home Apartment  Home Access Level entry  Prior Function  Level of Independence Independent with basic ADLs;Needs assistance with gait  Observation/Other Assessments  Skin Integrity intact R foot  Sensation  Light Touch Appears Intact  Strength  Right Hip Flexion 3/5  Right Knee Flexion 3/5  Right Knee Extension  3/5  Right Ankle Dorsiflexion 3/5  Right Hip Extension 3+/5  Transfers  Transfers Sit to Stand;Stand to Sit;Stand Pivot Transfers  Sit to Stand 4: Min guard  Five time sit to stand comments  24.6s  Stand to Sit 4: Min guard  Stand Pivot Transfers 4: Min guard  Comments no UE support needed  Ambulation/Gait  Gait Comments deferred at patient requst due to weight of R Insurance account manager unlimited                 Objective measurements completed on examination: See above findings.                  PT Short Term Goals - 02/13/21 0728       PT SHORT TERM GOAL #1   Title Patient to demo initial HEP back to PT w/o need of cuing    Baseline TBD    Time 4    Period Weeks    Status New    Target Date 03/20/21      PT SHORT TERM GOAL #2   Title Patient to perform all transfers with S    Baseline Currently SBA/CGA for STS, stand pivot and bed mobility    Time 4    Period Weeks    Status New    Target Date 03/20/21      PT SHORT TERM GOAL #3   Title Patient to amulate 42ft in // bars with SBA    Baseline non ambulatory at present    Time 4    Period Weeks    Status New    Target Date 03/20/21      PT SHORT TERM GOAL #4   Title Increase RLE hip extension, abduction, knee extension and DF to 3+/5    Baseline 3/5 all regions    Time 4    Period Weeks    Status New    Target Date 03/20/21      PT SHORT TERM GOAL #5   Title Assess BERG and establish goal    Baseline TBD    Time 4    Period Weeks    Status New    Target Date 03/20/21               PT Long Term Goals - 02/13/21 0733       PT LONG TERM GOAL #1   Title Patient to demo final HEP to PT w/o cuing    Baseline TBD    Time 8    Period Weeks    Status New    Target Date 04/17/21      PT LONG TERM GOAL #2   Title Patient to increase sterngth of RLE hip  extension, abduction, knee extension and DF to 4/5    Baseline 3/5 strength all regions    Time 8    Period Weeks    Status New    Target Date 04/17/21  PT LONG TERM GOAL #3   Title Perform TUG and establish goal    Baseline TBD    Time 8    Period Weeks    Status New    Target Date 04/17/21      PT LONG TERM GOAL #4   Title Patient to ambulate 227ft with LRAD under S    Baseline Non-ambulatory at present    Time 8    Period Weeks    Status New    Target Date 04/17/21      PT LONG TERM GOAL #5   Title Assess progress towards BERG    Baseline TBD    Time 8    Period Weeks    Status New    Target Date 04/17/21                    Plan - 02/13/21 0741     Clinical Impression Statement Patient referred to OPPT following CVA charted on 02/11/18, having no formal PT on record.  He uses a power WC for mobility but is abl to transfer in and out of it on his own.  He lives alone in a handicapped accessible apartment.  He sustained a fracture R ankle and was treated and released by Dr. Ophelia Charter on 11/13/20 with ibstructions to wean out of walking boot.  Patient has weakness in RLE graded at 3/5 grossly, he is able to transfer in and out of WC to and from mat table wearing boot.  He is able to stand unsupported under S for 60s.  Patient is currently not ambulating out of apprehension of falling.  He is a good candidate to return to independent transfers and household ambulation    Personal Factors and Comorbidities Comorbidity 2    Comorbidities CVA, obesity    Examination-Activity Limitations Stairs;Locomotion Level;Transfers    Examination-Participation Restrictions Community Activity;Meal Prep    Stability/Clinical Decision Making Stable/Uncomplicated    Clinical Decision Making Low    Rehab Potential Good    PT Frequency 2x / week    PT Duration 8 weeks    PT Treatment/Interventions ADLs/Self Care Home Management;Aquatic Therapy;DME Instruction;Gait training;Stair  training;Functional mobility training;Therapeutic activities;Therapeutic exercise;Balance training;Neuromuscular re-education;Patient/family education    PT Next Visit Plan establish HEP, transfer training, BERG    PT Home Exercise Plan TBD    Consulted and Agree with Plan of Care Patient             Patient will benefit from skilled therapeutic intervention in order to improve the following deficits and impairments:  Decreased endurance, Obesity, Decreased activity tolerance, Decreased strength, Decreased balance, Decreased mobility, Difficulty walking, Abnormal gait, Decreased safety awareness  Visit Diagnosis: Unsteadiness on feet  Gait disorder  Muscle weakness (generalized)     Problem List Patient Active Problem List   Diagnosis Date Noted   Hemiparesis affecting right side as late effect of cerebrovascular accident (CVA) (HCC) 09/06/2020   Personal history of fall 09/06/2020   Chronic right shoulder pain 09/06/2020   Closed bimalleolar fracture of right ankle 07/27/2020   History of loop recorder 05/02/2020   Carotid artery disease (HCC) 05/02/2020   Right hemiparesis (HCC) 04/30/2020   Obesity (BMI 35.0-39.9 without comorbidity) 04/30/2020   Hyperlipidemia 04/26/2019   Hypokalemia    Acute embolic stroke (HCC)    History of CVA (cerebrovascular accident) 02/11/2018   Acute kidney injury superimposed on chronic kidney disease (HCC) 02/11/2018   History of syphilis 02/11/2018   Constipation 02/11/2018  Osteoarthritis 02/11/2018   Benign essential HTN 02/11/2018   Acute encephalopathy 02/10/2018    Hildred Laser, PT 02/13/2021, 7:51 AM  [facility] [address] [place], [place], [address] Phone: [phone]   Fax:  [phone]  Name: Daaron Dimarco MRN: [id number] Date of Birth: [birth year]

## 2021-02-14 DIAGNOSIS — G8193 Hemiplegia, unspecified affecting right nondominant side: Secondary | ICD-10-CM | POA: Diagnosis not present

## 2021-02-14 LAB — CUP PACEART REMOTE DEVICE CHECK
Date Time Interrogation Session: 20220909230536
Implantable Pulse Generator Implant Date: 20201214

## 2021-02-15 DIAGNOSIS — G8193 Hemiplegia, unspecified affecting right nondominant side: Secondary | ICD-10-CM | POA: Diagnosis not present

## 2021-02-18 ENCOUNTER — Ambulatory Visit (INDEPENDENT_AMBULATORY_CARE_PROVIDER_SITE_OTHER): Payer: Medicaid Other

## 2021-02-18 DIAGNOSIS — I639 Cerebral infarction, unspecified: Secondary | ICD-10-CM | POA: Diagnosis not present

## 2021-02-18 DIAGNOSIS — G8193 Hemiplegia, unspecified affecting right nondominant side: Secondary | ICD-10-CM | POA: Diagnosis not present

## 2021-02-19 DIAGNOSIS — G8193 Hemiplegia, unspecified affecting right nondominant side: Secondary | ICD-10-CM | POA: Diagnosis not present

## 2021-02-20 ENCOUNTER — Other Ambulatory Visit: Payer: Self-pay

## 2021-02-20 ENCOUNTER — Ambulatory Visit: Payer: Medicaid Other | Admitting: Physical Therapy

## 2021-02-20 DIAGNOSIS — G8193 Hemiplegia, unspecified affecting right nondominant side: Secondary | ICD-10-CM | POA: Diagnosis not present

## 2021-02-20 DIAGNOSIS — M6281 Muscle weakness (generalized): Secondary | ICD-10-CM

## 2021-02-20 DIAGNOSIS — R2681 Unsteadiness on feet: Secondary | ICD-10-CM

## 2021-02-20 DIAGNOSIS — R269 Unspecified abnormalities of gait and mobility: Secondary | ICD-10-CM

## 2021-02-21 DIAGNOSIS — G8193 Hemiplegia, unspecified affecting right nondominant side: Secondary | ICD-10-CM | POA: Diagnosis not present

## 2021-02-21 NOTE — Therapy (Signed)
[facility] [address] [place], [place], [address] Phone: [phone]   Fax:  [phone]  Physical Therapy Treatment  Patient Details  Name: Robert Donovan MRN: [id number] Date of Birth: 01/27/1956 Referring Provider (PT): Jonah Blue MD   Encounter Date: 02/20/2021   PT End of Session - 02/20/21 0855     Visit Number 2    Number of Visits 17    Date for PT Re-Evaluation 04/15/21    Authorization Type UHC MCD    Authorization Time Period 27 visit limit combined disciplines    Authorization - Visit Number 1    Authorization - Number of Visits 27    Progress Note Due on Visit 10    PT Start Time 0849    PT Stop Time 0930    PT Time Calculation (min) 41 min    Equipment Utilized During Treatment Gait belt    Activity Tolerance Patient tolerated treatment well    Behavior During Therapy [facility] for tasks assessed/performed             Past Medical History:  Diagnosis Date   Arthritis    Bilateral Knees   High cholesterol    History of loop recorder 05/16/2019   Hypertension    Stroke (HCC) 02/10/2018    Past Surgical History:  Procedure Laterality Date   LOOP RECORDER IMPLANT  05/16/2019   TEE WITHOUT CARDIOVERSION N/A 02/16/2018   Procedure: TRANSESOPHAGEAL ECHOCARDIOGRAM (TEE);  Surgeon: Jodelle Red, MD;  Location: [facility] ENDOSCOPY;  Service: Cardiovascular;  Laterality: N/A;   TOOTH EXTRACTION Bilateral 10/19/2020   Procedure: DENTAL RESTORATION/EXTRACTIONS;  Surgeon: Ocie Doyne, DMD;  Location: [facility] OR;  Service: Oral Surgery;  Laterality: Bilateral;    There were no vitals filed for this visit.   Subjective Assessment - 02/20/21 0850     Subjective No new complaints.  No falls. Wearing CAM boot. Reports foot is too swollen to get into his shoe.    Pertinent History 1. Hemiparesis affecting right side as late effect of cerebrovascular accident (CVA) (HCC)  2. Ankle fracture, bimalleolar,  closed, right, sequela  I think it is reasonable for him to continue with PCS services due to residual right-sided weakness from stroke and gait disturbance associated with the stroke and his ankle fracture.     3. Chronic right shoulder pain  We will get an x-ray of the shoulder to see whether he has developed some arthritis in the shoulder.  Further management will be based on results.  - DG Shoulder Right; Future     4. History of syphilis  -Patient requesting follow-up visit with infectious disease specialist as he was told.  - Ambulatory referral to Infectious Disease    Limitations Walking;Standing    How long can you sit comfortably? >15 min    How long can you stand comfortably? <5 min    How long can you walk comfortably? n/a    Patient Stated Goals To improve my mobility    Currently in Pain? No/denies    Pain Score 0-No pain                [facility] PT Assessment - 02/20/21 0855       Standardized Balance Assessment   Standardized Balance Assessment Berg Balance Test      Berg Balance Test   Sit to Stand Able to stand using hands after several tries    Standing Unsupported Able to stand 2 minutes with supervision  Sitting with Back Unsupported but Feet Supported on Floor or Stool Able to sit safely and securely 2 minutes    Stand to Sit Controls descent by using hands    Transfers Able to transfer with verbal cueing and /or supervision    Standing Unsupported with Eyes Closed Able to stand 10 seconds with supervision    Standing Unsupported with Feet Together Needs help to attain position but able to stand for 30 seconds with feet together    From Standing, Reach Forward with Outstretched Arm Can reach forward >5 cm safely (2")   4 inches   From Standing Position, Pick up Object from Floor Unable to pick up and needs supervision    From Standing Position, Turn to Look Behind Over each Shoulder Needs supervision when turning    Turn 360 Degrees Needs close supervision or  verbal cueing    Standing Unsupported, Alternately Place Feet on Step/Stool Needs assistance to keep from falling or unable to try   bil UE support on sturdy surface   Standing Unsupported, One Foot in Front Able to take small step independently and hold 30 seconds    Standing on One Leg Tries to lift leg/unable to hold 3 seconds but remains standing independently    Total Score 26    Berg comment: <36 high risk for falls                   [facility] Adult PT Treatment/Exercise - 02/20/21 0855       Transfers   Transfers Sit to Stand;Stand to Sit;Stand Pivot Transfers    Sit to Stand 4: Min guard;With upper extremity assist;From bed;From chair/3-in-1    Stand to Sit 4: Min guard;With upper extremity assist;To bed;To chair/3-in-1    Stand Pivot Transfers 4: Min guard    Stand Pivot Transfer Details (indicate cue type and reason) wheelchair<>mat table with no AD, increased time and effort needed with min guard assist for safety.      Self-Care   Self-Care Other Self-Care Comments    Other Self-Care Comments  education on edema management in right LE. Use of cryo with elevation at home. Ace wrap applied in session today. Pt will only be able to have this placed at home when CNA is there to assist and in therapy.                  PT Short Term Goals - 02/21/21 1556       PT SHORT TERM GOAL #1   Title Patient to demo initial HEP back to PT w/o need of cuing. (all STGS due 03/20/21)    Baseline TBD    Time 4    Period Weeks    Status On-going    Target Date 03/20/21      PT SHORT TERM GOAL #2   Title Patient to perform all transfers with S    Baseline Currently SBA/CGA for STS, stand pivot and bed mobility    Time 4    Period Weeks    Status On-going    Target Date 03/20/21      PT SHORT TERM GOAL #3   Title Patient to amulate 67ft in // bars with SBA    Baseline non ambulatory at present    Time 4    Period Weeks    Status On-going    Target Date 03/20/21       PT SHORT TERM GOAL #4   Title Increase RLE hip extension, abduction,  knee extension and DF to 3+/5    Baseline 3/5 all regions    Time 4    Period Weeks    Status On-going    Target Date 03/20/21      PT SHORT TERM GOAL #5   Title Assess BERG and establish goal    Baseline 02/20/21: baseline score of 26/56    Time 4    Period Weeks    Status On-going    Target Date 03/20/21               PT Long Term Goals - 02/13/21 0733       PT LONG TERM GOAL #1   Title Patient to demo final HEP to PT w/o cuing    Baseline TBD    Time 8    Period Weeks    Status New    Target Date 04/17/21      PT LONG TERM GOAL #2   Title Patient to increase sterngth of RLE hip extension, abduction, knee extension and DF to 4/5    Baseline 3/5 strength all regions    Time 8    Period Weeks    Status New    Target Date 04/17/21      PT LONG TERM GOAL #3   Title Perform TUG and establish goal    Baseline TBD    Time 8    Period Weeks    Status New    Target Date 04/17/21      PT LONG TERM GOAL #4   Title Patient to ambulate 285ft with LRAD under S    Baseline Non-ambulatory at present    Time 8    Period Weeks    Status New    Target Date 04/17/21      PT LONG TERM GOAL #5   Title Assess progress towards BERG    Baseline TBD    Time 8    Period Weeks    Status New    Target Date 04/17/21                   Plan - 02/20/21 0855     Clinical Impression Statement Today's skilled session continued to focus on transfer training. Pt reporting not able to wear shoes at this time due to swelling, having to wear boot. Edema managment strategies discussed- elevation, ice and compression. Ace wrap used in session today and sent home with patient. Remainder of session focused on establishing baseline for [employer] with score of 26/56. Rest breaks taken as needed in session with no other issues noted or repored by patient. The pt is making progress and should benefit from  continued PT to progress toward unmet goals.    Personal Factors and Comorbidities Comorbidity 2    Comorbidities CVA, obesity    Examination-Activity Limitations Stairs;Locomotion Level;Transfers    Examination-Participation Restrictions Community Activity;Meal Prep    Stability/Clinical Decision Making Stable/Uncomplicated    Rehab Potential Good    PT Frequency 2x / week    PT Duration 8 weeks    PT Treatment/Interventions ADLs/Self Care Home Management;Aquatic Therapy;DME Instruction;Gait training;Stair training;Functional mobility training;Therapeutic activities;Therapeutic exercise;Balance training;Neuromuscular re-education;Patient/family education    PT Next Visit Plan establish HEP for strengthening and balance, continue to work on transfers and pre-gait/gait    PT Home Exercise Plan TBD    Consulted and Agree with Plan of Care Patient             Patient will benefit from  skilled therapeutic intervention in order to improve the following deficits and impairments:  Decreased endurance, Obesity, Decreased activity tolerance, Decreased strength, Decreased balance, Decreased mobility, Difficulty walking, Abnormal gait, Decreased safety awareness  Visit Diagnosis: Unsteadiness on feet  Gait disorder  Muscle weakness (generalized)     Problem List Patient Active Problem List   Diagnosis Date Noted   Hemiparesis affecting right side as late effect of cerebrovascular accident (CVA) (HCC) 09/06/2020   Personal history of fall 09/06/2020   Chronic right shoulder pain 09/06/2020   Closed bimalleolar fracture of right ankle 07/27/2020   History of loop recorder 05/02/2020   Carotid artery disease (HCC) 05/02/2020   Right hemiparesis (HCC) 04/30/2020   Obesity (BMI 35.0-39.9 without comorbidity) 04/30/2020   Hyperlipidemia 04/26/2019   Hypokalemia    Acute embolic stroke (HCC)    History of CVA (cerebrovascular accident) 02/11/2018   Acute kidney injury superimposed on  chronic kidney disease (HCC) 02/11/2018   History of syphilis 02/11/2018   Constipation 02/11/2018   Osteoarthritis 02/11/2018   Benign essential HTN 02/11/2018   Acute encephalopathy 02/10/2018   Sallyanne Kuster, PTA, [facility] [address] [place], [place] [address] [phone] 02/21/21, 4:00 PM   Name: Robert Donovan MRN: [id number] Date of Birth: 07/06/1955

## 2021-02-22 ENCOUNTER — Other Ambulatory Visit: Payer: Self-pay

## 2021-02-22 ENCOUNTER — Ambulatory Visit: Payer: Medicaid Other

## 2021-02-22 DIAGNOSIS — G8193 Hemiplegia, unspecified affecting right nondominant side: Secondary | ICD-10-CM | POA: Diagnosis not present

## 2021-02-22 DIAGNOSIS — R269 Unspecified abnormalities of gait and mobility: Secondary | ICD-10-CM

## 2021-02-22 DIAGNOSIS — M6281 Muscle weakness (generalized): Secondary | ICD-10-CM

## 2021-02-22 DIAGNOSIS — R2681 Unsteadiness on feet: Secondary | ICD-10-CM

## 2021-02-22 NOTE — Therapy (Signed)
[facility] [address] [place], [place], [address] Phone: [phone]   Fax:  [phone]  Physical Therapy Treatment  Patient Details  Name: Robert Donovan MRN: [id number] Date of Birth: Mar 04, 1956 Referring Provider (PT): Jonah Blue MD   Encounter Date: 02/22/2021   PT End of Session - 02/22/21 1009     Visit Number 3    Number of Visits 17    Date for PT Re-Evaluation 04/15/21    Authorization Type UHC MCD    Authorization Time Period 27 visit limit combined disciplines    Authorization - Visit Number 1    Authorization - Number of Visits 27    Progress Note Due on Visit 10    PT Start Time 0930    PT Stop Time 1015    PT Time Calculation (min) 45 min    Equipment Utilized During Treatment Gait belt    Activity Tolerance Patient tolerated treatment well    Behavior During Therapy [facility] for tasks assessed/performed             Past Medical History:  Diagnosis Date   Arthritis    Bilateral Knees   High cholesterol    History of loop recorder 05/16/2019   Hypertension    Stroke (HCC) 02/10/2018    Past Surgical History:  Procedure Laterality Date   LOOP RECORDER IMPLANT  05/16/2019   TEE WITHOUT CARDIOVERSION N/A 02/16/2018   Procedure: TRANSESOPHAGEAL ECHOCARDIOGRAM (TEE);  Surgeon: Jodelle Red, MD;  Location: [facility] ENDOSCOPY;  Service: Cardiovascular;  Laterality: N/A;   TOOTH EXTRACTION Bilateral 10/19/2020   Procedure: DENTAL RESTORATION/EXTRACTIONS;  Surgeon: Ocie Doyne, DMD;  Location: [facility] OR;  Service: Oral Surgery;  Laterality: Bilateral;    There were no vitals filed for this visit.   Subjective Assessment - 02/22/21 0936     Subjective Has shoes today but reports swelling in R foot has decreased. Still notes med lateral discomfort in R ankle    Pertinent History 1. Hemiparesis affecting right side as late effect of cerebrovascular accident (CVA) (HCC)  2. Ankle fracture,  bimalleolar, closed, right, sequela  I think it is reasonable for him to continue with PCS services due to residual right-sided weakness from stroke and gait disturbance associated with the stroke and his ankle fracture.     3. Chronic right shoulder pain  We will get an x-ray of the shoulder to see whether he has developed some arthritis in the shoulder.  Further management will be based on results.  - DG Shoulder Right; Future     4. History of syphilis  -Patient requesting follow-up visit with infectious disease specialist as he was told.  - Ambulatory referral to Infectious Disease    Limitations Walking;Standing    How long can you sit comfortably? >15 min    How long can you stand comfortably? <5 min    How long can you walk comfortably? n/a    Patient Stated Goals To improve my mobility                               [facility] Adult PT Treatment/Exercise - 02/22/21 0001       Transfers   Transfers Sit to Stand;Stand to Sit;Stand Pivot Transfers    Sit to Stand 4: Min guard;With upper extremity assist;From bed;From chair/3-in-1    Stand to Sit 4: Min guard;With upper extremity assist;To bed;To chair/3-in-1    Stand Pivot Transfers  4: Min guard      Manual Therapy   Manual Therapy Joint mobilization;Soft tissue mobilization    Joint Mobilization performed to R ankle includin posterior fib head and talar eversion to correct inversion posturing      Ankle Exercises: Seated   Toe Raise 10 reps;Limitations    Toe Raise Limitations 2x10 R    Other Seated Ankle Exercises inv/ev with towel                 Balance Exercises - 02/22/21 0001       Balance Exercises: Standing   Standing Eyes Opened Narrow base of support (BOS);Solid surface;Limitations    Standing Eyes Opened Limitations 60s hold    Tandem Stance Eyes open;1 rep;Limitations    Tandem Stance Time 60s hold in each position                  PT Short Term Goals - 02/22/21 1139       PT  SHORT TERM GOAL #1   Title Patient to demo initial HEP back to PT w/o need of cuing. (all STGS due 03/20/21)    Baseline TBD; 02/22/21 [email or web address]    Time 4    Period Weeks    Status On-going    Target Date 03/20/21      PT SHORT TERM GOAL #2   Title Patient to perform all transfers with S    Baseline Currently SBA/CGA for STS, stand pivot and bed mobility    Time 4    Period Weeks    Status On-going    Target Date 03/20/21      PT SHORT TERM GOAL #3   Title Patient to amulate 30ft in // bars with SBA    Baseline non ambulatory at present    Time 4    Period Weeks    Status On-going    Target Date 03/20/21      PT SHORT TERM GOAL #4   Title Increase RLE hip extension, abduction, knee extension and DF to 3+/5    Baseline 3/5 all regions    Time 4    Period Weeks    Status On-going    Target Date 03/20/21      PT SHORT TERM GOAL #5   Title Assess BERG and establish goal    Baseline 02/20/21: baseline score of 26/56    Time 4    Period Weeks    Status On-going    Target Date 03/20/21               PT Long Term Goals - 02/13/21 0733       PT LONG TERM GOAL #1   Title Patient to demo final HEP to PT w/o cuing    Baseline TBD    Time 8    Period Weeks    Status New    Target Date 04/17/21      PT LONG TERM GOAL #2   Title Patient to increase sterngth of RLE hip extension, abduction, knee extension and DF to 4/5    Baseline 3/5 strength all regions    Time 8    Period Weeks    Status New    Target Date 04/17/21      PT LONG TERM GOAL #3   Title Perform TUG and establish goal    Baseline TBD    Time 8    Period Weeks    Status New    Target Date 04/17/21  PT LONG TERM GOAL #4   Title Patient to ambulate 244ft with LRAD under S    Baseline Non-ambulatory at present    Time 8    Period Weeks    Status New    Target Date 04/17/21      PT LONG TERM GOAL #5   Title Assess progress towards BERG    Baseline TBD    Time 8    Period Weeks    Status  New    Target Date 04/17/21                   Plan - 02/22/21 1009     Clinical Impression Statement Todays session focused on improving ankle mobility through joint mobs, establishing a HEP and beginning weightbearing tasks with UE support as needed.  Able to tolerate 60s stands w/o elevated ankle pain and was able to transfer with SBA in/out of WC.  No issues noted with static standing regarding R ankle pain.    Personal Factors and Comorbidities Comorbidity 2    Comorbidities CVA, obesity    Examination-Activity Limitations Stairs;Locomotion Level;Transfers    Examination-Participation Restrictions Community Activity;Meal Prep    Stability/Clinical Decision Making Stable/Uncomplicated    Rehab Potential Good    PT Frequency 2x / week    PT Duration 8 weeks    PT Treatment/Interventions ADLs/Self Care Home Management;Aquatic Therapy;DME Instruction;Gait training;Stair training;Functional mobility training;Therapeutic activities;Therapeutic exercise;Balance training;Neuromuscular re-education;Patient/family education    PT Next Visit Plan continue to work on transfers and pre-gait/gait, ankle ROM/mobs, ambulation in // bars    PT Home Exercise Plan [email or web address]    Consulted and Agree with Plan of Care Patient             Patient will benefit from skilled therapeutic intervention in order to improve the following deficits and impairments:  Decreased endurance, Obesity, Decreased activity tolerance, Decreased strength, Decreased balance, Decreased mobility, Difficulty walking, Abnormal gait, Decreased safety awareness  Visit Diagnosis: Unsteadiness on feet  Muscle weakness (generalized)  Gait disorder     Problem List Patient Active Problem List   Diagnosis Date Noted   Hemiparesis affecting right side as late effect of cerebrovascular accident (CVA) (HCC) 09/06/2020   Personal history of fall 09/06/2020   Chronic right shoulder pain 09/06/2020   Closed bimalleolar  fracture of right ankle 07/27/2020   History of loop recorder 05/02/2020   Carotid artery disease (HCC) 05/02/2020   Right hemiparesis (HCC) 04/30/2020   Obesity (BMI 35.0-39.9 without comorbidity) 04/30/2020   Hyperlipidemia 04/26/2019   Hypokalemia    Acute embolic stroke (HCC)    History of CVA (cerebrovascular accident) 02/11/2018   Acute kidney injury superimposed on chronic kidney disease (HCC) 02/11/2018   History of syphilis 02/11/2018   Constipation 02/11/2018   Osteoarthritis 02/11/2018   Benign essential HTN 02/11/2018   Acute encephalopathy 02/10/2018    Hildred Laser, PT 02/22/2021, 11:44 AM   Outpt Rehabilitation [facility] [address] [place], [place], [address] Phone: [phone]   Fax:  [phone]  Name: Robert Donovan MRN: [id number] Date of Birth: 1956/03/22

## 2021-02-22 NOTE — Patient Instructions (Signed)
Access Code: [email or web address] URL: [email or web address] Date: 02/22/2021 Prepared by: Gustavus Bryant  Exercises Ankle Inversion Eversion Towel Slide - 2 x daily - 7 x weekly - 2 sets - 10 reps Seated Heel Raise - 2 x daily - 7 x weekly - 2 sets - 10 reps Seated Toe Raise - 2 x daily - 7 x weekly - 2 sets - 10 reps

## 2021-02-25 ENCOUNTER — Ambulatory Visit (INDEPENDENT_AMBULATORY_CARE_PROVIDER_SITE_OTHER): Payer: Medicaid Other | Admitting: Podiatry

## 2021-02-25 ENCOUNTER — Encounter: Payer: Self-pay | Admitting: Podiatry

## 2021-02-25 ENCOUNTER — Other Ambulatory Visit: Payer: Self-pay

## 2021-02-25 DIAGNOSIS — B351 Tinea unguium: Secondary | ICD-10-CM

## 2021-02-25 DIAGNOSIS — N179 Acute kidney failure, unspecified: Secondary | ICD-10-CM | POA: Diagnosis not present

## 2021-02-25 DIAGNOSIS — N189 Chronic kidney disease, unspecified: Secondary | ICD-10-CM

## 2021-02-25 DIAGNOSIS — D689 Coagulation defect, unspecified: Secondary | ICD-10-CM | POA: Diagnosis not present

## 2021-02-25 DIAGNOSIS — M79674 Pain in right toe(s): Secondary | ICD-10-CM | POA: Diagnosis not present

## 2021-02-25 DIAGNOSIS — I739 Peripheral vascular disease, unspecified: Secondary | ICD-10-CM

## 2021-02-25 DIAGNOSIS — M79675 Pain in left toe(s): Secondary | ICD-10-CM | POA: Diagnosis not present

## 2021-02-25 NOTE — Progress Notes (Signed)
Carelink Summary Report / Loop Recorder 

## 2021-02-25 NOTE — Progress Notes (Signed)
This patient returns to the office for at risk foot care.  This patient requires this care by a professional since this patient will be at risk due to having swelling, peripheral angiopathy acute kidney disease and symtomatic toenails.    These nails are painful walking and wearing shoes.  This patient presents for at risk foot care today.  Patient has broken foot and presents to the office in a wheelchair with his mother ° °General Appearance  Alert, conversant and in no acute stress. ° °Vascular  Dorsalis pedis palpable and posterior tibial  pulses are non palpable  bilaterally.  Capillary return is within normal limits  bilaterally. Temperature is within normal limits  Bilaterally. Bilateral swelling is present  ° °Neurologic  Senn-Weinstein monofilament wire test within normal limits  bilaterally. Muscle power within normal limits bilaterally. ° °Nails Thick disfigured discolored nails with subungual debris  from hallux to fifth toes bilaterally. No evidence of bacterial infection or drainage bilaterally.  ° °Orthopedic  No limitations of motion  feet .  No crepitus or effusions noted.  No bony pathology or digital deformities noted.  Significant ankle swelling right ankle. ° °Skin  normotropic skin with no porokeratosis noted bilaterally.  No signs of infections or ulcers noted.    I ° °Assessment: ° °Onychomycosis  Pain in right toes  Pain in left toes, peripheral angiopathy with swelling bilateral.  ° °Plan: °Consent was obtained for treatment procedures.   Mechanical debridement of nails 1-5  bilaterally performed with a nail nipper.  Filed with dremel without incident.  ° ° °Return office visit   3 months.    Told patient to return for periodic foot care and evaluation due to potential at risk complications. ° °Kylina Vultaggio DPM ° ° ° °

## 2021-02-26 DIAGNOSIS — G8193 Hemiplegia, unspecified affecting right nondominant side: Secondary | ICD-10-CM | POA: Diagnosis not present

## 2021-02-27 ENCOUNTER — Ambulatory Visit: Payer: Medicaid Other | Admitting: Physical Therapy

## 2021-02-27 DIAGNOSIS — G8193 Hemiplegia, unspecified affecting right nondominant side: Secondary | ICD-10-CM | POA: Diagnosis not present

## 2021-02-28 DIAGNOSIS — G8193 Hemiplegia, unspecified affecting right nondominant side: Secondary | ICD-10-CM | POA: Diagnosis not present

## 2021-03-01 ENCOUNTER — Ambulatory Visit: Payer: Medicaid Other

## 2021-03-01 DIAGNOSIS — G8193 Hemiplegia, unspecified affecting right nondominant side: Secondary | ICD-10-CM | POA: Diagnosis not present

## 2021-03-02 DIAGNOSIS — R531 Weakness: Secondary | ICD-10-CM | POA: Diagnosis not present

## 2021-03-04 ENCOUNTER — Other Ambulatory Visit: Payer: Self-pay

## 2021-03-04 NOTE — Patient Instructions (Signed)
Visit Information ° °Mr. Geran Utecht  - as a part of your Medicaid benefit, you are eligible for care management and care coordination services at no cost or copay. I was unable to reach you by phone today but would be happy to help you with your health related needs. Please feel free to call me @ [phone].  ° °A member of the Managed Medicaid care management team will reach out to you again over the next 30 days.  ° °Rayya Yagi, BSW, MHA °[facility]   [place]  °High Risk Managed Medicaid Team  °[phone]  °

## 2021-03-04 NOTE — Patient Outreach (Signed)
Care Coordination  03/04/2021  Keithan Dileonardo 1955/09/23 [id number]   Medicaid Managed Care   Unsuccessful Outreach Note  03/04/2021 Name: Liborio Saccente MRN: [id number] DOB: [birth year]  Referred by: [name], MD Reason for referral : High Risk Managed Medicaid (MM Social Work Unsuccessful Telephone Outreach)   An unsuccessful telephone outreach was attempted today. The patient was referred to the case management team for assistance with care management and care coordination.   Follow Up Plan: The care management team will reach out to the patient again over the next 30 days.   [name], BSW, [place] [facility]  [employer] Risk Managed Medicaid Team  [phone]

## 2021-03-06 ENCOUNTER — Ambulatory Visit: Payer: Medicaid Other | Attending: Internal Medicine | Admitting: Physical Therapy

## 2021-03-06 ENCOUNTER — Other Ambulatory Visit: Payer: Self-pay

## 2021-03-06 DIAGNOSIS — R2681 Unsteadiness on feet: Secondary | ICD-10-CM | POA: Diagnosis not present

## 2021-03-06 DIAGNOSIS — M6281 Muscle weakness (generalized): Secondary | ICD-10-CM | POA: Diagnosis present

## 2021-03-06 DIAGNOSIS — R269 Unspecified abnormalities of gait and mobility: Secondary | ICD-10-CM | POA: Diagnosis present

## 2021-03-06 NOTE — Therapy (Signed)
[facility] [address] [place], [place], [address] Phone: [phone]   Fax:  [phone]  Physical Therapy Treatment  Patient Details  Name: Robert Donovan MRN: [id number] Date of Birth: 18-Jun-1955 Referring Provider (PT): Jonah Blue MD   Encounter Date: 03/06/2021   PT End of Session - 03/06/21 0851     Visit Number 4    Number of Visits 17    Date for PT Re-Evaluation 04/15/21    Authorization Type UHC MCD    Authorization Time Period 27 visit limit combined disciplines    Authorization - Visit Number 3    Authorization - Number of Visits 27    Progress Note Due on Visit 10    PT Start Time 0848    PT Stop Time 0930    PT Time Calculation (min) 42 min    Equipment Utilized During Treatment Gait belt    Activity Tolerance Patient tolerated treatment well    Behavior During Therapy [facility] for tasks assessed/performed             Past Medical History:  Diagnosis Date   Arthritis    Bilateral Knees   High cholesterol    History of loop recorder 05/16/2019   Hypertension    Stroke (HCC) 02/10/2018    Past Surgical History:  Procedure Laterality Date   LOOP RECORDER IMPLANT  05/16/2019   TEE WITHOUT CARDIOVERSION N/A 02/16/2018   Procedure: TRANSESOPHAGEAL ECHOCARDIOGRAM (TEE);  Surgeon: Jodelle Red, MD;  Location: [facility] ENDOSCOPY;  Service: Cardiovascular;  Laterality: N/A;   TOOTH EXTRACTION Bilateral 10/19/2020   Procedure: DENTAL RESTORATION/EXTRACTIONS;  Surgeon: Ocie Doyne, DMD;  Location: [facility] OR;  Service: Oral Surgery;  Laterality: Bilateral;    There were no vitals filed for this visit.   Subjective Assessment - 03/06/21 0851     Subjective No new complaints. Has both shoes on today.    Pertinent History 1. Hemiparesis affecting right side as late effect of cerebrovascular accident (CVA) (HCC)  2. Ankle fracture, bimalleolar, closed, right, sequela  I think it is reasonable for  him to continue with PCS services due to residual right-sided weakness from stroke and gait disturbance associated with the stroke and his ankle fracture.     3. Chronic right shoulder pain  We will get an x-ray of the shoulder to see whether he has developed some arthritis in the shoulder.  Further management will be based on results.  - DG Shoulder Right; Future     4. History of syphilis  -Patient requesting follow-up visit with infectious disease specialist as he was told.  - Ambulatory referral to Infectious Disease    Limitations Walking;Standing    How long can you sit comfortably? >15 min    How long can you stand comfortably? <5 min    How long can you walk comfortably? n/a    Patient Stated Goals To improve my mobility    Currently in Pain? No/denies    Pain Score 0-No pain                    [facility] Adult PT Treatment/Exercise - 03/06/21 0853       Transfers   Transfers Sit to Stand;Stand to Sit;Stand Pivot Transfers    Sit to Stand 4: Min guard;With upper extremity assist;From bed;From chair/3-in-1    Stand to Sit 4: Min guard;With upper extremity assist;To bed;To chair/3-in-1    Stand Pivot Transfers 4: Min guard    Stand Pivot  Transfer Details (indicate cue type and reason) power chair > mat table no AD, min guard assist for balance, pt placing left hand on PTA shoulder.      Ambulation/Gait   Ambulation/Gait Yes    Ambulation/Gait Assistance 4: Min guard;4: Min assist    Ambulation/Gait Assistance Details cues on posture, for increased step length and walker position with gait.    Ambulation Distance (Feet) 20 Feet   x2 reps   Assistive device Rolling walker    Gait Pattern Step-to pattern;Decreased step length - right;Decreased step length - left;Decreased stride length   right foot IRs at times   Ambulation Surface Level;Indoor      Exercises   Exercises Other Exercises    Other Exercises  seated at edge of mat table: heel<>toe raises x 10 reps; with 2# ankle  weights- long arc quads, then marching for 10 reps each bil sides with cues for slow, controlled movements with assist to prevent pt from leaning back with movements. with red band foot slides/HS curls with foot on towel to allow it to slide for 10 reps, cues for controlled movements; standing with RW support: alternating marching for 10 reps with cues on posture; then alternating hip kicks on right, stepping out/in on left for 10 reps each side. cues on posture, and assist for walker stability/weight shifting.                       PT Short Term Goals - 02/22/21 1139       PT SHORT TERM GOAL #1   Title Patient to demo initial HEP back to PT w/o need of cuing. (all STGS due 03/20/21)    Baseline TBD; 02/22/21 [email or web address]    Time 4    Period Weeks    Status On-going    Target Date 03/20/21      PT SHORT TERM GOAL #2   Title Patient to perform all transfers with S    Baseline Currently SBA/CGA for STS, stand pivot and bed mobility    Time 4    Period Weeks    Status On-going    Target Date 03/20/21      PT SHORT TERM GOAL #3   Title Patient to amulate 104ft in // bars with SBA    Baseline non ambulatory at present    Time 4    Period Weeks    Status On-going    Target Date 03/20/21      PT SHORT TERM GOAL #4   Title Increase RLE hip extension, abduction, knee extension and DF to 3+/5    Baseline 3/5 all regions    Time 4    Period Weeks    Status On-going    Target Date 03/20/21      PT SHORT TERM GOAL #5   Title Assess BERG and establish goal    Baseline 02/20/21: baseline score of 26/56    Time 4    Period Weeks    Status On-going    Target Date 03/20/21               PT Long Term Goals - 02/13/21 0733       PT LONG TERM GOAL #1   Title Patient to demo final HEP to PT w/o cuing    Baseline TBD    Time 8    Period Weeks    Status New    Target Date 04/17/21      PT LONG TERM  GOAL #2   Title Patient to increase sterngth of RLE hip extension,  abduction, knee extension and DF to 4/5    Baseline 3/5 strength all regions    Time 8    Period Weeks    Status New    Target Date 04/17/21      PT LONG TERM GOAL #3   Title Perform TUG and establish goal    Baseline TBD    Time 8    Period Weeks    Status New    Target Date 04/17/21      PT LONG TERM GOAL #4   Title Patient to ambulate 247ft with LRAD under S    Baseline Non-ambulatory at present    Time 8    Period Weeks    Status New    Target Date 04/17/21      PT LONG TERM GOAL #5   Title Assess progress towards BERG    Baseline TBD    Time 8    Period Weeks    Status New    Target Date 04/17/21                   Plan - 03/06/21 1062     Clinical Impression Statement Today's skilled session continued to focus on transfer training and strengthening with no issues noted or reported. Also began to work on short distance gait with RW with up to min assist needed, no knee buckling noted. The pt is making steady progress and should benefit from continued PT to progress toward unmet goals.    Personal Factors and Comorbidities Comorbidity 2    Comorbidities CVA, obesity    Examination-Activity Limitations Stairs;Locomotion Level;Transfers    Examination-Participation Restrictions Community Activity;Meal Prep    Stability/Clinical Decision Making Stable/Uncomplicated    Rehab Potential Good    PT Frequency 2x / week    PT Duration 8 weeks    PT Treatment/Interventions ADLs/Self Care Home Management;Aquatic Therapy;DME Instruction;Gait training;Stair training;Functional mobility training;Therapeutic activities;Therapeutic exercise;Balance training;Neuromuscular re-education;Patient/family education    PT Next Visit Plan continue to work on transfers and strengthening/standing balance, ankle ROM/mobs, ambulation with RW    PT Home Exercise Plan [email or web address]    Consulted and Agree with Plan of Care Patient             Patient will benefit from skilled  therapeutic intervention in order to improve the following deficits and impairments:  Decreased endurance, Obesity, Decreased activity tolerance, Decreased strength, Decreased balance, Decreased mobility, Difficulty walking, Abnormal gait, Decreased safety awareness  Visit Diagnosis: Unsteadiness on feet  Muscle weakness (generalized)     Problem List Patient Active Problem List   Diagnosis Date Noted   Hemiparesis affecting right side as late effect of cerebrovascular accident (CVA) (HCC) 09/06/2020   Personal history of fall 09/06/2020   Chronic right shoulder pain 09/06/2020   Closed bimalleolar fracture of right ankle 07/27/2020   History of loop recorder 05/02/2020   Carotid artery disease (HCC) 05/02/2020   Right hemiparesis (HCC) 04/30/2020   Obesity (BMI 35.0-39.9 without comorbidity) 04/30/2020   Hyperlipidemia 04/26/2019   Hypokalemia    Acute embolic stroke (HCC)    History of CVA (cerebrovascular accident) 02/11/2018   Acute kidney injury superimposed on chronic kidney disease (HCC) 02/11/2018   History of syphilis 02/11/2018   Constipation 02/11/2018   Osteoarthritis 02/11/2018   Benign essential HTN 02/11/2018   Acute encephalopathy 02/10/2018    Sallyanne Kuster, PTA, CLT Outpatient Neuro [facility] [address],  [address] [place], [place] [address] [phone] 03/06/21, 10:02 PM   Name: Robert Donovan MRN: [id number] Date of Birth: [birth year]

## 2021-03-08 ENCOUNTER — Other Ambulatory Visit: Payer: Self-pay

## 2021-03-08 ENCOUNTER — Other Ambulatory Visit: Payer: Self-pay | Admitting: *Deleted

## 2021-03-08 ENCOUNTER — Ambulatory Visit: Payer: Medicaid Other

## 2021-03-08 DIAGNOSIS — M6281 Muscle weakness (generalized): Secondary | ICD-10-CM

## 2021-03-08 DIAGNOSIS — R2681 Unsteadiness on feet: Secondary | ICD-10-CM

## 2021-03-08 DIAGNOSIS — R269 Unspecified abnormalities of gait and mobility: Secondary | ICD-10-CM

## 2021-03-08 NOTE — Patient Instructions (Signed)
Visit Information  Robert Donovan was given information about Medicaid Managed Care team care coordination services as a part of their [facility] Community Plan Medicaid benefit. [name] verbally consented to engagement with the [facility] Managed Care team.   If you are experiencing a medical emergency, please call 911 or report to your local emergency department or urgent care.   If you have a non-emergency medical problem during routine business hours, please contact your provider's office and ask to speak with a nurse.   For questions related to your [facility], please call: [phone] or visit the homepage here: [email or web address]  If you would like to schedule transportation through your [facility], please call the following number at least 2 days in advance of your appointment: [phone].   Call the Behavioral Health Crisis Line at [phone], at any time, 24 hours a day, 7 days a week. If you are in danger or need immediate medical attention call 911.  If you would like help to quit smoking, call 1-800-QUIT-NOW (639-457-3540) OR Espaol: 1-855-Djelo-Ya (4-174-081-4481) o para ms informacin haga clic aqu or Text READY to 856-314 to register via text  Robert Donovan - following are the goals we discussed in your visit today:   Goals Addressed             This Visit's Progress    Find Help in My Community       Timeframe:  Long-Range Goal Priority:  High Start Date:   10/22/20                          Expected End Date:  05/06/21                    Follow Up Date 05/06/21   - arrange medical transportation provided by [facility] [phone] at least 2-3 days prior to appointment - Contact [employer] [phone] for Wellness Benefits - begin a notebook of services in my neighborhood or community - follow-up on any referrals for help I am given    Why is this  important?   Knowing how and where to find help for yourself or family in your neighborhood and community is an important skill.  You will want to take some steps to learn how.         Make and Keep All Appointments       Timeframe:  Long-Range Goal Priority:  High Start Date:   10/22/20                          Expected End Date: 05/06/21                      Follow Up Date 05/06/21  - call [facility] [phone] to schedule an eye appointment(referral placed by Dr. Laural Benes) - call Outpatient Rehab [phone] to schedule PT appointment(referral placed by Dr. Laural Benes) - attend all scheduled appointments, call to schedule follow up with PCP-03/13/21 @ [facility] - arrange a ride through an agency 1 week before appointment - call to cancel if needed - keep a calendar with prescription refill dates - keep a calendar with appointment dates  - Contact provider office with any questions or concerns   Why is this important?   Part of staying healthy is seeing the doctor for follow-up care.  If you forget your appointments, there are some things  you can do to stay on track.            Please see education materials related to wellness provided as print materials.   The patient verbalized understanding of instructions provided today and agreed to receive a mailed copy of patient instruction and/or educational materials.  Telephone follow up appointment with Managed Medicaid care management team member scheduled for:05/06/21 @ 10:30am  Estanislado Emms RN, BSN [place]  [facility] RN Care Coordinator   Following is a copy of your plan of care:  Patient Care Plan: General Plan of Care (Adult)     Problem Identified: Health Promotion or Disease Self-Management (General Plan of Care)      Long-Range Goal: Self-Management Plan Developed   Start Date: 10/22/2020  Expected End Date: 05/06/2021  Recent Progress: On track  Priority: High  Note:   Current Barriers:   Ineffective Self Health Maintenance-RNCM was unable to reach Robert Donovan, spoke with patient's DPR [name]. [name] helps Robert Donovan manage his healthcare at home. 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 He fell while using a walker and broke his foot.  His short term memory is declining and he forgets to take his medications and gets confused as to what he is suppose to take and when. He has an aide that comes in 3 times a week to assist with bathing and dressing. Robert Donovan is in need of an eye exam. [name] has questions regarding the patients BP medications and dosage, progress of referral for motorized wheelchair. Robert Donovan had all of his teeth pulled last week and will be getting dentures in the future. [name] and DPR, [name], reports [employer] denied motorized wheelchair and an appeal has been filed. An eye exam has not been scheduled at this time. A referral for PT has been placed by PCP-needs to be scheduled. Robert Donovan is having great success taking prepackaged medications via [employer].-Update-[name] reports improvement since Robert Donovan starting Neuro PT. She is interested in other means of transportation, patient currently using [facility]. An appointment for eye exam has not been scheduled, concern that the office has call Robert Donovan to schedule and he does not answer his phone. Unable to self administer medications as prescribed Unable to perform ADLs independently Does not contact provider office for questions/concerns Currently UNABLE TO independently self manage needs related to chronic health conditions.  Knowledge Deficits related to short term plan for care coordination needs and long term plans for chronic disease management needs Nurse Case Manager Clinical Goal(s):  patient will work with care management team to address care coordination and chronic disease management needs related to Disease Management   Interventions:   Evaluation of current treatment plan related to HTN and mobility and patient's adherence to plan as established by provider. Advised patient/DPR to contact PCP provider with any questions and concerns Discussed plans with patient/DPR for ongoing care management follow up and provided patient with direct contact information for care management team Advised patient/DPR, providing education and rationale, to monitor blood pressure daily and record, calling PCP for findings outside established parameters.  Provided patient/ and/or caregiver with contact information for [facility] [phone], advised to call and schedule an appointment, referral placed by Dr. Laural Benes Reviewed scheduled/upcoming provider appointments  Provided education on preventive health Provided patient/DPR with medical transportation provided by [facility] [phone], call at least 2-3 days prior to appointment Discussed benefits provided by [facility], encouraged [name]  to call [phone] for more information  Self Care Activities:  Patient will self administer medications as prescribed Patient will attend all scheduled provider appointments Patient will attend church or other social activities Patient will call provider office for new concerns or questions Patient will work with BSW to address care coordination needs and will continue to work with the clinical team to address health care and disease management related needs.   Patient will work with MM Pharmacist, Harrold Donath for medication management Patient Goals: - arrange medical transportation provided by [facility] [phone] at least 2-3 days prior to appointment - Contact [employer] [phone] for Wellness Benefits - begin a notebook of services in my neighborhood or community - follow-up on any referrals for help I am given - call [facility] [phone] to schedule an eye appointment(referral placed by Dr. Laural Benes) - call Outpatient Rehab [phone] to schedule PT  appointment(referral placed by Dr. Laural Benes) - attend all scheduled appointments, call to schedule follow up with PCP-03/13/21 @ [facility] - arrange a ride through an agency 1 week before appointment - call to cancel if needed - keep a calendar with prescription refill dates - keep a calendar with appointment dates  - Contact provider office with any questions or concerns Follow Up Plan: Telephone follow up appointment with care management team member scheduled for:05/06/21 @ 10:30am     Patient Care Plan: Medication Management     Problem Identified: Health Promotion or Disease Self-Management (General Plan of Care)      Goal: Medication Management   Note:   Current Barriers:  Unable to self administer medications as prescribed Does not adhere to prescribed medication regimen Does not maintain contact with provider office Does not contact provider office for questions/concerns   Pharmacist Clinical Goal(s):  Over the next 30 days, patient will contact provider office for questions/concerns as evidenced notation of same in electronic health record through collaboration with PharmD and provider.    Interventions: Inter-disciplinary care team collaboration (see longitudinal plan of care) Comprehensive medication review performed; medication list updated in electronic medical record    Patient Goals/Self-Care Activities Over the next 30 days, patient will:  - collaborate with provider on medication access solutions  Follow Up Plan: The care management team will reach out to the patient again over the next 30 days.

## 2021-03-08 NOTE — Patient Outreach (Signed)
Medicaid Managed Care   Nurse Care Manager Note  03/08/2021 Name:  Robert Donovan MRN:  [id number] DOB:  1955-09-16  Robert Donovan is an 65 y.o. year old male who is a primary patient of Robert Matar, MD.  The [facility] Managed Care Coordination team was consulted for assistance with:    Hx stroke  Robert Donovan was given information about Medicaid Managed Care Coordination team services today. Robert Donovan Release (DPR) agreed to services and verbal consent obtained.  Engaged with patient by telephone for follow up visit in response to provider referral for case management and/or care coordination services.   Assessments/Interventions:  Review of past medical history, allergies, medications, health status, including review of consultants reports, laboratory and other test data, was performed as part of comprehensive evaluation and provision of chronic care management services.  SDOH (Social Determinants of Health) assessments and interventions performed: SDOH Interventions    Flowsheet Row Most Recent Value  SDOH Interventions   Food Insecurity Interventions Intervention Not Indicated  Housing Interventions Intervention Not Indicated  Social Connections Interventions Intervention Not Indicated       Care Plan  Allergies  Allergen Reactions   Hydrocodone     Pt stated, "I am not allergic, I do not want to take this medicine ever" Codeine - "I do not want to ever take this medicine - not allergic"   Oxycodone     Pt stated, "I am not allergic, I do not want to take this medicine ever"    Medications Reviewed Today     Reviewed by Robert Dach, RN (Registered Nurse) on 03/08/21 at 1346  Med List Status: <None>   Medication Order Taking? Sig Documenting Provider Last Dose Status Informant  amLODipine (NORVASC) 5 MG tablet [id number] No Take 1 tablet (5 mg total) by mouth daily. Robert Matar, MD Taking Active   aspirin EC 81 MG tablet [id number] No Take  1 tablet (81 mg total) by mouth daily. Robert Matar, MD Taking Active   atorvastatin (LIPITOR) 40 MG tablet [id number] No Take 1 tablet (40 mg total) by mouth every evening. Robert Matar, MD Taking Active   calcium-vitamin D (OSCAL WITH D) 500-200 MG-UNIT tablet [id number] No Take 1 tablet by mouth daily. [provider] Taking Active Self  lisinopril-hydrochlorothiazide (ZESTORETIC) 20-25 MG tablet [id number] No TAKE 2 TABLETS BY MOUTH DAILY FOR BLOOD PRESSURE Robert Matar, MD Taking Active   Misc. Devices MISC [id number] No Large blood pressure cuff device For home blood pressure monitoring Robert Matar, MD Taking Active   Multiple Vitamins-Minerals (MENS MULTIVITAMIN PLUS PO) [id number] No Take 1 tablet by mouth daily. [provider] Taking Active Self  naproxen (NAPROSYN) 250 MG tablet [id number] No Take 500 mg by mouth 2 (two) times daily with a meal. [provider] Taking Active Self  vitamin B-12 (CYANOCOBALAMIN) 500 MCG tablet [id number] No Take 1 tablet (500 mcg total) by mouth daily. Robert Matar, MD Taking Active             Patient Active Problem List   Diagnosis Date Noted   Hemiparesis affecting right side as late effect of cerebrovascular accident (CVA) (HCC) 09/06/2020   Personal history of fall 09/06/2020   Chronic right shoulder pain 09/06/2020   Closed bimalleolar fracture of right ankle 07/27/2020   History of loop recorder 05/02/2020   Carotid artery disease (HCC) 05/02/2020   Right hemiparesis (HCC) 04/30/2020   Obesity (BMI 35.0-39.9 without comorbidity) 04/30/2020  Hyperlipidemia 04/26/2019   Hypokalemia    Acute embolic stroke (HCC)    History of CVA (cerebrovascular accident) 02/11/2018   Acute kidney injury superimposed on chronic kidney disease (HCC) 02/11/2018   History of syphilis 02/11/2018   Constipation 02/11/2018   Osteoarthritis 02/11/2018   Benign essential HTN 02/11/2018   Acute encephalopathy  02/10/2018    Conditions to be addressed/monitored per PCP order:   Hx stroke  Care Plan : General Plan of Care (Adult)  Updates made by Robert Dach, RN since 03/08/2021 12:00 AM     Problem: Health Promotion or Disease Self-Management (General Plan of Care)      Long-Range Goal: Self-Management Plan Developed   Start Date: 10/22/2020  Expected End Date: 05/06/2021  Recent Progress: On track  Priority: High  Note:   Current Barriers:  Ineffective Self Health Maintenance-RNCM was unable to reach Robert Donovan, spoke with patient's DPR Robert Donovan. Robert Donovan helps Robert Donovan manage his healthcare at home. 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 He fell while using a walker and broke his foot.  His short term memory is declining and he forgets to take his medications and gets confused as to what he is suppose to take and when. He has an aide that comes in 3 times a week to assist with bathing and dressing. Robert Donovan is in need of an eye exam. Robert Donovan has questions regarding the patients BP medications and dosage, progress of referral for motorized wheelchair. Robert Donovan had all of his teeth pulled last week and will be getting dentures in the future. [name] and DPR, Robert Donovan, reports [employer] denied motorized wheelchair and an appeal has been filed. An eye exam has not been scheduled at this time. A referral for PT has been placed by PCP-needs to be scheduled. Robert Donovan is having great success taking prepackaged medications via [employer].-Update-[name] reports improvement since Robert Donovan starting Neuro PT. She is interested in other means of transportation, patient currently using [facility]. An appointment for eye exam has not been scheduled, concern that the office has call Robert Donovan to schedule and he does not answer his phone. Unable to self administer medications as prescribed Unable to perform ADLs independently Does not contact provider  office for questions/concerns Currently UNABLE TO independently self manage needs related to chronic health conditions.  Knowledge Deficits related to short term plan for care coordination needs and long term plans for chronic disease management needs Nurse Case Manager Clinical Goal(s):  patient will work with care management team to address care coordination and chronic disease management needs related to Disease Management   Interventions:  Evaluation of current treatment plan related to HTN and mobility and patient's adherence to plan as established by provider. Advised patient/DPR to contact PCP provider with any questions and concerns Discussed plans with patient/DPR for ongoing care management follow up and provided patient with direct contact information for care management team Advised patient/DPR, providing education and rationale, to monitor blood pressure daily and record, calling PCP for findings outside established parameters.  Provided patient/ and/or caregiver with contact information for [facility] [phone], advised to call and schedule an appointment, referral placed by Dr. Laural Benes Reviewed scheduled/upcoming provider appointments  Provided education on preventive health Provided patient/DPR with medical transportation provided by [facility] [phone], call at least 2-3 days prior to appointment Discussed benefits provided by [facility], encouraged Robert Donovan to call [phone] for more information  Self Care Activities:  Patient will self administer medications as prescribed Patient will attend all scheduled provider appointments Patient will attend church or other social activities Patient will call provider office for new concerns or questions Patient will work with BSW to address care coordination needs and will continue to work with the clinical team to address health care and disease management related needs.   Patient will work with MM Pharmacist, Harrold Donath for medication  management Patient Goals: - arrange medical transportation provided by [facility] [phone] at least 2-3 days prior to appointment - Contact [employer] [phone] for Wellness Benefits - begin a notebook of services in my neighborhood or community - follow-up on any referrals for help I am given - call [facility] [phone] to schedule an eye appointment(referral placed by Dr. Laural Benes) - call Outpatient Rehab [phone] to schedule PT appointment(referral placed by Dr. Laural Benes) - attend all scheduled appointments, call to schedule follow up with PCP-03/13/21 @ [facility] - arrange a ride through an agency 1 week before appointment - call to cancel if needed - keep a calendar with prescription refill dates - keep a calendar with appointment dates  - Contact provider office with any questions or concerns Follow Up Plan: Telephone follow up appointment with care management team member scheduled for:05/06/21 @ 10:30am      Follow Up:  Patient agrees to Care Plan and Follow-up.  Plan: The Managed Medicaid care management team will reach out to the patient again over the next 60 days.  Date/time of next scheduled RN care management/care coordination outreach:  05/06/21 @ 10:30am  Estanislado Emms RN, BSN [place]  [facility] RN Care Coordinator

## 2021-03-08 NOTE — Therapy (Signed)
[facility] [address] [place], [place], [address] Phone: [phone]   Fax:  [phone]  Physical Therapy Treatment  Patient Details  Name: Robert Donovan MRN: [id number] Date of Birth: Jul 29, 1955 Referring Provider (PT): Jonah Blue MD   Encounter Date: 03/08/2021   PT End of Session - 03/08/21 0844     Visit Number 5    Number of Visits 17    Date for PT Re-Evaluation 04/15/21    Authorization Type [employer] MCD    Authorization Time Period 27 visit limit combined disciplines    Authorization - Visit Number 3    Authorization - Number of Visits 27    Progress Note Due on Visit 10    PT Start Time 0845    PT Stop Time 0930    PT Time Calculation (min) 45 min    Equipment Utilized During Treatment Gait belt    Activity Tolerance Patient tolerated treatment well    Behavior During Therapy [facility] for tasks assessed/performed             Past Medical History:  Diagnosis Date   Arthritis    Bilateral Knees   High cholesterol    History of loop recorder 05/16/2019   Hypertension    Stroke (HCC) 02/10/2018    Past Surgical History:  Procedure Laterality Date   LOOP RECORDER IMPLANT  05/16/2019   TEE WITHOUT CARDIOVERSION N/A 02/16/2018   Procedure: TRANSESOPHAGEAL ECHOCARDIOGRAM (TEE);  Surgeon: Jodelle Red, MD;  Location: [facility] ENDOSCOPY;  Service: Cardiovascular;  Laterality: N/A;   TOOTH EXTRACTION Bilateral 10/19/2020   Procedure: DENTAL RESTORATION/EXTRACTIONS;  Surgeon: Ocie Doyne, DMD;  Location: [facility] OR;  Service: Oral Surgery;  Laterality: Bilateral;    There were no vitals filed for this visit.   Subjective Assessment - 03/08/21 0849     Subjective No changes to note. Ankle continues to swell but no pain reported    Pertinent History 1. Hemiparesis affecting right side as late effect of cerebrovascular accident (CVA) (HCC)  2. Ankle fracture, bimalleolar, closed, right, sequela  I think  it is reasonable for him to continue with PCS services due to residual right-sided weakness from stroke and gait disturbance associated with the stroke and his ankle fracture.     3. Chronic right shoulder pain  We will get an x-ray of the shoulder to see whether he has developed some arthritis in the shoulder.  Further management will be based on results.  - DG Shoulder Right; Future     4. History of syphilis  -Patient requesting follow-up visit with infectious disease specialist as he was told.  - Ambulatory referral to Infectious Disease    Limitations Walking;Standing    How long can you sit comfortably? >15 min    How long can you stand comfortably? <5 min    How long can you walk comfortably? n/a    Patient Stated Goals To improve my mobility                               [facility] Adult PT Treatment/Exercise - 03/08/21 0001       Transfers   Transfers Sit to Stand;Stand to Sit;Stand Pivot Transfers    Sit to Stand 4: Min guard;With upper extremity assist;From bed;From chair/3-in-1    Stand to Sit 4: Min guard;With upper extremity assist;To bed;To chair/3-in-1    Stand Pivot Transfers 4: Min guard    Stand  Pivot Transfer Details (indicate cue type and reason) no need of clinician support      Ambulation/Gait   Ambulation/Gait Yes    Ambulation/Gait Assistance 4: Min guard;4: Min assist    Ambulation/Gait Assistance Details heel toe gait on R    Ambulation Distance (Feet) 75 Feet    Assistive device Rolling walker    Gait Pattern Step-through pattern    Ambulation Surface Level;Indoor      Manual Therapy   Joint Mobilization performed to R ankle including posterior fib head and talar eversion to correct inversion posturing.  Added manually resisted DF and eversion for 30 reps ea.      Ankle Exercises: Supine   Other Supine Ankle Exercises manually resisted DF and eversion 30 reps ea.      Ankle Exercises: Seated   Heel Raises Both;Limitations    Heel Raises  Limitations 30x    Toe Raise Limitations    Toe Raise Limitations 30x                 Balance Exercises - 03/08/21 0001       Balance Exercises: Standing   Tandem Stance Eyes open;Intermittent upper extremity support;1 rep;30 secs    Tandem Stance Time 30s hold in each position                  PT Short Term Goals - 02/22/21 1139       PT SHORT TERM GOAL #1   Title Patient to demo initial HEP back to PT w/o need of cuing. (all STGS due 03/20/21)    Baseline TBD; 02/22/21 [id number]    Time 4    Period Weeks    Status On-going    Target Date 03/20/21      PT SHORT TERM GOAL #2   Title Patient to perform all transfers with S    Baseline Currently SBA/CGA for STS, stand pivot and bed mobility    Time 4    Period Weeks    Status On-going    Target Date 03/20/21      PT SHORT TERM GOAL #3   Title Patient to amulate 41ft in // bars with SBA    Baseline non ambulatory at present    Time 4    Period Weeks    Status On-going    Target Date 03/20/21      PT SHORT TERM GOAL #4   Title Increase RLE hip extension, abduction, knee extension and DF to 3+/5    Baseline 3/5 all regions    Time 4    Period Weeks    Status On-going    Target Date 03/20/21      PT SHORT TERM GOAL #5   Title Assess BERG and establish goal    Baseline 02/20/21: baseline score of 26/56    Time 4    Period Weeks    Status On-going    Target Date 03/20/21               PT Long Term Goals - 02/13/21 0733       PT LONG TERM GOAL #1   Title Patient to demo final HEP to PT w/o cuing    Baseline TBD    Time 8    Period Weeks    Status New    Target Date 04/17/21      PT LONG TERM GOAL #2   Title Patient to increase sterngth of RLE hip extension, abduction, knee extension and DF  to 4/5    Baseline 3/5 strength all regions    Time 8    Period Weeks    Status New    Target Date 04/17/21      PT LONG TERM GOAL #3   Title Perform TUG and establish goal    Baseline TBD     Time 8    Period Weeks    Status New    Target Date 04/17/21      PT LONG TERM GOAL #4   Title Patient to ambulate 274ft with LRAD under S    Baseline Non-ambulatory at present    Time 8    Period Weeks    Status New    Target Date 04/17/21      PT LONG TERM GOAL #5   Title Assess progress towards BERG    Baseline TBD    Time 8    Period Weeks    Status New    Target Date 04/17/21                   Plan - 03/08/21 0910     Clinical Impression Statement Continued manual ankle mobs into DF and eversion, manually resisted DF and eversion for 30 reps each to fatigue, Added static standinm in tandem position, ambulation with RW with CGA.. R ankle still shows inversion tendency with WB and ambulation.  PROM DF 4d    Personal Factors and Comorbidities Comorbidity 2    Comorbidities CVA, obesity    Examination-Activity Limitations Stairs;Locomotion Level;Transfers    Examination-Participation Restrictions Community Activity;Meal Prep    Stability/Clinical Decision Making Stable/Uncomplicated    Rehab Potential Good    PT Frequency 2x / week    PT Duration 8 weeks    PT Treatment/Interventions ADLs/Self Care Home Management;Aquatic Therapy;DME Instruction;Gait training;Stair training;Functional mobility training;Therapeutic activities;Therapeutic exercise;Balance training;Neuromuscular re-education;Patient/family education    PT Next Visit Plan continue to work on transfers and strengthening/standing balance, ankle ROM/mobs, ambulation with RW, monitor inversion tendency    PT Home Exercise Plan [email or web address]    Consulted and Agree with Plan of Care Patient             Patient will benefit from skilled therapeutic intervention in order to improve the following deficits and impairments:  Decreased endurance, Obesity, Decreased activity tolerance, Decreased strength, Decreased balance, Decreased mobility, Difficulty walking, Abnormal gait, Decreased safety awareness  Visit  Diagnosis: Unsteadiness on feet  Muscle weakness (generalized)  Gait disorder     Problem List Patient Active Problem List   Diagnosis Date Noted   Hemiparesis affecting right side as late effect of cerebrovascular accident (CVA) (HCC) 09/06/2020   Personal history of fall 09/06/2020   Chronic right shoulder pain 09/06/2020   Closed bimalleolar fracture of right ankle 07/27/2020   History of loop recorder 05/02/2020   Carotid artery disease (HCC) 05/02/2020   Right hemiparesis (HCC) 04/30/2020   Obesity (BMI 35.0-39.9 without comorbidity) 04/30/2020   Hyperlipidemia 04/26/2019   Hypokalemia    Acute embolic stroke (HCC)    History of CVA (cerebrovascular accident) 02/11/2018   Acute kidney injury superimposed on chronic kidney disease (HCC) 02/11/2018   History of syphilis 02/11/2018   Constipation 02/11/2018   Osteoarthritis 02/11/2018   Benign essential HTN 02/11/2018   Acute encephalopathy 02/10/2018    Hildred Laser, PT 03/08/2021, 9:28 AM   Outpt Rehabilitation [facility] [address] [place], [place], [address] Phone: [phone]   Fax:  [phone]  Name: Robert Donovan  MRN: [id number] Date of Birth: [birth year]

## 2021-03-11 DIAGNOSIS — G8193 Hemiplegia, unspecified affecting right nondominant side: Secondary | ICD-10-CM | POA: Diagnosis not present

## 2021-03-12 DIAGNOSIS — G8193 Hemiplegia, unspecified affecting right nondominant side: Secondary | ICD-10-CM | POA: Diagnosis not present

## 2021-03-13 ENCOUNTER — Ambulatory Visit: Payer: Medicaid Other | Admitting: Physical Therapy

## 2021-03-13 ENCOUNTER — Other Ambulatory Visit: Payer: Self-pay

## 2021-03-13 ENCOUNTER — Ambulatory Visit: Payer: Medicaid Other | Attending: Physician Assistant | Admitting: Physician Assistant

## 2021-03-13 ENCOUNTER — Encounter: Payer: Self-pay | Admitting: Physician Assistant

## 2021-03-13 VITALS — BP 95/62 | HR 62 | Temp 98.2°F | Resp 18 | Ht 69.0 in | Wt 246.0 lb

## 2021-03-13 DIAGNOSIS — R2681 Unsteadiness on feet: Secondary | ICD-10-CM

## 2021-03-13 DIAGNOSIS — I69351 Hemiplegia and hemiparesis following cerebral infarction affecting right dominant side: Secondary | ICD-10-CM

## 2021-03-13 DIAGNOSIS — Z8619 Personal history of other infectious and parasitic diseases: Secondary | ICD-10-CM

## 2021-03-13 DIAGNOSIS — M6281 Muscle weakness (generalized): Secondary | ICD-10-CM

## 2021-03-13 NOTE — Patient Instructions (Signed)
Please let us know if there is anything else we can do for you.  Tanina Barb S. Mayers, PA-C Physician Assistant [facility] [email or web address]  

## 2021-03-13 NOTE — Progress Notes (Signed)
Established Patient Office Visit  Subjective:  Patient ID: Robert Donovan, male    DOB: [birth year]  Age: 65 y.o. MRN: [id number]  CC:  Chief Complaint  Patient presents with   needs rolling walker     HPI Robert Donovan presents with a request for a signature on a prescription for a rolling walker.  Reports that he continues with physical therapy and they do believe that this would be beneficial to him.  States that he recently saw infectious disease for his syphilis diagnosis, states that he is concerned that he was not treated for this.  No other concerns at this time.     Past Medical History:  Diagnosis Date   Arthritis    Bilateral Knees   High cholesterol    History of loop recorder 05/16/2019   Hypertension    Stroke (HCC) 02/10/2018    Past Surgical History:  Procedure Laterality Date   LOOP RECORDER IMPLANT  05/16/2019   TEE WITHOUT CARDIOVERSION N/A 02/16/2018   Procedure: TRANSESOPHAGEAL ECHOCARDIOGRAM (TEE);  Surgeon: Jodelle Red, MD;  Location[facility] ENDOSCOPY;  Service: Cardiovascular;  Laterality: N/A;   TOOTH EXTRACTION Bilateral 10/19/2020   Procedure: DENTAL RESTORATION/EXTRACTIONS;  Surgeon: Ocie Doyne, DMD;  Location: [facility] OR;  Service: Oral Surgery;  Laterality: Bilateral;    Family History  Problem Relation Age of Onset   Healthy Mother    Heart attack Father    Diabetes Brother     Social History   Socioeconomic History   Marital status: Single    Spouse name: Not on file   Number of children: Not on file   Years of education: Not on file   Highest education level: Not on file  Occupational History   Not on file  Tobacco Use   Smoking status: Former   Smokeless tobacco: Never  Vaping Use   Vaping Use: Never used  Substance and Sexual Activity   Alcohol use: Not Currently   Drug use: Not Currently    Comment: hx marijuana use   Sexual activity: Not Currently    Partners: Female    Birth control/protection: None     Comment: last encounter 2011  Other Topics Concern   Not on file  Social History Narrative   Not on file   Social Determinants of Health   Financial Resource Strain: Not on file  Food Insecurity: No Food Insecurity   Worried About Programme researcher, broadcasting/film/video in the Last Year: Never true   Ran Out of Food in the Last Year: Never true  Transportation Needs: No Transportation Needs   Lack of Transportation (Medical): No   Lack of Transportation (Non-Medical): No  Physical Activity: Not on file  Stress: Not on file  Social Connections: Moderately Integrated   Frequency of Communication with Friends and Family: More than three times a week   Frequency of Social Gatherings with Friends and Family: More than three times a week   Attends Religious Services: More than 4 times per year   Active Member of [name] or Organizations: Yes   Attends Banker Meetings: Never   Marital Status: Never married  Catering manager Violence: Not on file    Outpatient Medications Prior to Visit  Medication Sig Dispense Refill   amLODipine (NORVASC) 5 MG tablet Take 1 tablet (5 mg total) by mouth daily. 90 tablet 2   aspirin EC 81 MG tablet Take 1 tablet (81 mg total) by mouth daily. 90 tablet 3   atorvastatin (LIPITOR) 40  MG tablet Take 1 tablet (40 mg total) by mouth every evening. 90 tablet 2   calcium-vitamin D (OSCAL WITH D) 500-200 MG-UNIT tablet Take 1 tablet by mouth daily.     lisinopril-hydrochlorothiazide (ZESTORETIC) 20-25 MG tablet TAKE 2 TABLETS BY MOUTH DAILY FOR BLOOD PRESSURE 60 tablet 3   Misc. Devices MISC Large blood pressure cuff device For home blood pressure monitoring 1 Device 0   Multiple Vitamins-Minerals (MENS MULTIVITAMIN PLUS PO) Take 1 tablet by mouth daily.     naproxen (NAPROSYN) 250 MG tablet Take 500 mg by mouth 2 (two) times daily with a meal.     vitamin B-12 (CYANOCOBALAMIN) 500 MCG tablet Take 1 tablet (500 mcg total) by mouth daily. 100 tablet 2   No  facility-administered medications prior to visit.    Allergies  Allergen Reactions   Hydrocodone     Pt stated, "I am not allergic, I do not want to take this medicine ever" Codeine - "I do not want to ever take this medicine - not allergic"   Oxycodone     Pt stated, "I am not allergic, I do not want to take this medicine ever"    ROS Review of Systems  Constitutional: Negative.   HENT: Negative.    Eyes: Negative.   Respiratory: Negative.    Cardiovascular: Negative.   Gastrointestinal: Negative.   Endocrine: Negative.   Genitourinary: Negative.   Musculoskeletal:  Positive for gait problem.  Skin: Negative.   Allergic/Immunologic: Negative.   Neurological:  Positive for weakness.  Hematological: Negative.   Psychiatric/Behavioral: Negative.       Objective:    Physical Exam Vitals and nursing note reviewed.  Constitutional:      Appearance: Normal appearance.     Comments: In wheelchair  HENT:     Head: Normocephalic and atraumatic.     Right Ear: External ear normal.     Left Ear: External ear normal.     Nose: Nose normal.     Mouth/Throat:     Mouth: Mucous membranes are moist.     Pharynx: Oropharynx is clear.  Eyes:     Extraocular Movements: Extraocular movements intact.     Conjunctiva/sclera: Conjunctivae normal.     Pupils: Pupils are equal, round, and reactive to light.  Cardiovascular:     Rate and Rhythm: Normal rate and regular rhythm.     Pulses: Normal pulses.     Heart sounds: Normal heart sounds.  Pulmonary:     Effort: Pulmonary effort is normal.     Breath sounds: Normal breath sounds.  Musculoskeletal:     Cervical back: Normal range of motion and neck supple.     Comments: In wheelchair  Skin:    General: Skin is warm and dry.  Neurological:     General: No focal deficit present.     Mental Status: He is alert and oriented to person, place, and time.  Psychiatric:        Mood and Affect: Mood normal.        Behavior: Behavior  normal.        Thought Content: Thought content normal.        Judgment: Judgment normal.    BP 95/62 (BP Location: Left Arm, Patient Position: Sitting, Cuff Size: Normal)   Pulse 62   Temp 98.2 F (36.8 C) (Oral)   Resp 18   Ht 5\' 9"  (1.753 m)   Wt 246 lb (111.6 kg)   SpO2 98%  BMI 36.33 kg/m  Wt Readings from Last 3 Encounters:  03/13/21 246 lb (111.6 kg)  02/06/21 250 lb (113.4 kg)  01/15/21 250 lb 6.4 oz (113.6 kg)     Health Maintenance Due  Topic Date Due   TETANUS/TDAP  Never done   Zoster Vaccines- Shingrix (1 of 2) Never done   COVID-19 Vaccine (4 - Booster for Moderna series) 07/31/2020   Fecal DNA (Cologuard)  12/08/2020   INFLUENZA VACCINE  12/31/2020    There are no preventive care reminders to display for this patient.  Lab Results  Component Value Date   TSH 1.620 02/08/2018   Lab Results  Component Value Date   WBC 10.8 02/02/2020   HGB 13.6 10/19/2020   HCT 40.0 10/19/2020   MCV 89 02/02/2020   PLT 222 02/02/2020   Lab Results  Component Value Date   NA 140 10/19/2020   K 3.7 10/19/2020   CO2 24 02/02/2020   GLUCOSE 95 10/19/2020   BUN 20 10/19/2020   CREATININE 1.10 10/19/2020   BILITOT 1.0 02/02/2020   ALKPHOS 97 02/02/2020   AST 14 02/02/2020   ALT 8 02/02/2020   PROT 7.2 02/02/2020   ALBUMIN 4.3 02/02/2020   CALCIUM 9.7 02/02/2020   ANIONGAP 9 02/15/2018   Lab Results  Component Value Date   CHOL 123 02/02/2020   Lab Results  Component Value Date   HDL 37 (L) 02/02/2020   Lab Results  Component Value Date   LDLCALC 73 02/02/2020   Lab Results  Component Value Date   TRIG 57 02/02/2020   Lab Results  Component Value Date   CHOLHDL 3.3 02/02/2020   Lab Results  Component Value Date   HGBA1C 4.9 03/17/2019      Assessment & Plan:   Problem List Items Addressed This Visit       Nervous and Auditory   Hemiparesis affecting right side as late effect of cerebrovascular accident (CVA) (HCC) - Primary      Other   History of syphilis    No orders of the defined types were placed in this encounter. 1. Hemiparesis affecting right side as late effect of cerebrovascular accident (CVA) (HCC) Paperwork completed on patient's behalf.  Patient's primary care provider Dr. Laural Benes is not available today, Dr. Alvis Lemmings did complete the paperwork for him, this provider was not able to sign the prescription.  2. History of syphilis Reviewed history of successful treatment with patient.  Patient appreciated time provider spent with them, patient understood and agreed his treatment was successful.   I have reviewed the patient's medical history (PMH, PSH, Social History, Family History, Medications, and allergies) , and have been updated if relevant. I spent 15 minutes reviewing chart and  face to face time with patient.     Follow-up: Return if symptoms worsen or fail to improve.    Kasandra Knudsen Mayers, PA-C

## 2021-03-13 NOTE — Therapy (Signed)
[facility] [address] [place], [place], [address] Phone: [phone]   Fax:  [phone]  Physical Therapy Treatment  Patient Details  Name: Robert Donovan MRN: [id number] Date of Birth: 31-Jul-1955 Referring Provider (PT): Jonah Blue MD   Encounter Date: 03/13/2021   PT End of Session - 03/13/21 0852     Visit Number 6    Number of Visits 17    Date for PT Re-Evaluation 04/15/21    Authorization Type U[employer] MCD    Authorization Time Period 27 visit limit combined disciplines    Authorization - Visit Number 4    Authorization - Number of Visits 27    Progress Note Due on Visit 10    PT Start Time 0849    PT Stop Time 0930    PT Time Calculation (min) 41 min    Equipment Utilized During Treatment Gait belt    Activity Tolerance Patient tolerated treatment well    Behavior During Therapy [facility] for tasks assessed/performed             Past Medical History:  Diagnosis Date   Arthritis    Bilateral Knees   High cholesterol    History of loop recorder 05/16/2019   Hypertension    Stroke (HCC) 02/10/2018    Past Surgical History:  Procedure Laterality Date   LOOP RECORDER IMPLANT  05/16/2019   TEE WITHOUT CARDIOVERSION N/A 02/16/2018   Procedure: TRANSESOPHAGEAL ECHOCARDIOGRAM (TEE);  Surgeon: Jodelle Red, MD;  Location: [facility] ENDOSCOPY;  Service: Cardiovascular;  Laterality: N/A;   TOOTH EXTRACTION Bilateral 10/19/2020   Procedure: DENTAL RESTORATION/EXTRACTIONS;  Surgeon: Ocie Doyne, DMD;  Location: [facility] OR;  Service: Oral Surgery;  Laterality: Bilateral;    There were no vitals filed for this visit.   Subjective Assessment - 03/13/21 0851     Subjective No new complaints. No pain at this time. Swelling continues to decrease. No falls. See's PCP after therapy today, planning to ask for RW order.    Pertinent History 1. Hemiparesis affecting right side as late effect of cerebrovascular accident  (CVA) (HCC)  2. Ankle fracture, bimalleolar, closed, right, sequela  I think it is reasonable for him to continue with PCS services due to residual right-sided weakness from stroke and gait disturbance associated with the stroke and his ankle fracture.     3. Chronic right shoulder pain  We will get an x-ray of the shoulder to see whether he has developed some arthritis in the shoulder.  Further management will be based on results.  - DG Shoulder Right; Future     4. History of syphilis  -Patient requesting follow-up visit with infectious disease specialist as he was told.  - Ambulatory referral to Infectious Disease    Limitations Walking;Standing    How long can you sit comfortably? >15 min    How long can you stand comfortably? <5 min    How long can you walk comfortably? n/a    Patient Stated Goals To improve my mobility    Currently in Pain? No/denies                   [facility] Adult PT Treatment/Exercise - 03/13/21 0855       Transfers   Transfers Sit to Stand;Stand to Sit    Sit to Stand 4: Min guard;With upper extremity assist;From bed;From chair/3-in-1    Stand to Sit 4: Min guard;With upper extremity assist;To bed;To chair/3-in-1      Ambulation/Gait  Ambulation/Gait Yes    Ambulation/Gait Assistance 4: Min guard    Ambulation Distance (Feet) 25 Feet   x2   Assistive device Rolling walker    Gait Pattern Step-through pattern    Ambulation Surface Indoor      Exercises   Exercises Other Exercises    Other Exercises  seated at edge of mat table: heel<>toe raises x 20 reps; with 2# ankle weights- long arc quads, then marching for 10 reps each bil sides with cues for slow, controlled movements with assist to prevent pt from leaning back with movements. with red band foot slides/HS curls with foot on towel to allow it to slide for 10 reps, cues for controlled movements;      Knee/Hip Exercises: Aerobic   Other Aerobic Scifit level 2.5 x 8 minutes with UE/LE's with goal >/=  50 steps per minute for strengthening and activity tolerance.                    PT Short Term Goals - 02/22/21 1139       PT SHORT TERM GOAL #1   Title Patient to demo initial HEP back to PT w/o need of cuing. (all STGS due 03/20/21)    Baseline TBD; 02/22/21 [id number]    Time 4    Period Weeks    Status On-going    Target Date 03/20/21      PT SHORT TERM GOAL #2   Title Patient to perform all transfers with S    Baseline Currently SBA/CGA for STS, stand pivot and bed mobility    Time 4    Period Weeks    Status On-going    Target Date 03/20/21      PT SHORT TERM GOAL #3   Title Patient to amulate 37ft in // bars with SBA    Baseline non ambulatory at present    Time 4    Period Weeks    Status On-going    Target Date 03/20/21      PT SHORT TERM GOAL #4   Title Increase RLE hip extension, abduction, knee extension and DF to 3+/5    Baseline 3/5 all regions    Time 4    Period Weeks    Status On-going    Target Date 03/20/21      PT SHORT TERM GOAL #5   Title Assess BERG and establish goal    Baseline 02/20/21: baseline score of 26/56    Time 4    Period Weeks    Status On-going    Target Date 03/20/21               PT Long Term Goals - 02/13/21 0733       PT LONG TERM GOAL #1   Title Patient to demo final HEP to PT w/o cuing    Baseline TBD    Time 8    Period Weeks    Status New    Target Date 04/17/21      PT LONG TERM GOAL #2   Title Patient to increase sterngth of RLE hip extension, abduction, knee extension and DF to 4/5    Baseline 3/5 strength all regions    Time 8    Period Weeks    Status New    Target Date 04/17/21      PT LONG TERM GOAL #3   Title Perform TUG and establish goal    Baseline TBD    Time 8  Period Weeks    Status New    Target Date 04/17/21      PT LONG TERM GOAL #4   Title Patient to ambulate 240ft with LRAD under S    Baseline Non-ambulatory at present    Time 8    Period Weeks    Status New     Target Date 04/17/21      PT LONG TERM GOAL #5   Title Assess progress towards BERG    Baseline TBD    Time 8    Period Weeks    Status New    Target Date 04/17/21                   Plan - 03/13/21 0853     Clinical Impression Statement Today's skilled session continued to focus on strengthening and gait with RW with no issues noted or reported in session. The pt is progressing toward goals and should benefit from continued PT to progress toward unmet goals.    Personal Factors and Comorbidities Comorbidity 2    Comorbidities CVA, obesity    Examination-Activity Limitations Stairs;Locomotion Level;Transfers    Examination-Participation Restrictions Community Activity;Meal Prep    Stability/Clinical Decision Making Stable/Uncomplicated    Rehab Potential Good    PT Frequency 2x / week    PT Duration 8 weeks    PT Treatment/Interventions ADLs/Self Care Home Management;Aquatic Therapy;DME Instruction;Gait training;Stair training;Functional mobility training;Therapeutic activities;Therapeutic exercise;Balance training;Neuromuscular re-education;Patient/family education    PT Next Visit Plan STGs due 03/20/21. continue to work on transfers and strengthening/standing balance, ankle ROM/mobs, ambulation with RW, monitor inversion tendency    PT Home Exercise Plan [id number]    Consulted and Agree with Plan of Care Patient             Patient will benefit from skilled therapeutic intervention in order to improve the following deficits and impairments:  Decreased endurance, Obesity, Decreased activity tolerance, Decreased strength, Decreased balance, Decreased mobility, Difficulty walking, Abnormal gait, Decreased safety awareness  Visit Diagnosis: Unsteadiness on feet  Muscle weakness (generalized)     Problem List Patient Active Problem List   Diagnosis Date Noted   Hemiparesis affecting right side as late effect of cerebrovascular accident (CVA) (HCC) 09/06/2020    Personal history of fall 09/06/2020   Chronic right shoulder pain 09/06/2020   Closed bimalleolar fracture of right ankle 07/27/2020   History of loop recorder 05/02/2020   Carotid artery disease (HCC) 05/02/2020   Right hemiparesis (HCC) 04/30/2020   Obesity (BMI 35.0-39.9 without comorbidity) 04/30/2020   Hyperlipidemia 04/26/2019   Hypokalemia    Acute embolic stroke (HCC)    History of CVA (cerebrovascular accident) 02/11/2018   Acute kidney injury superimposed on chronic kidney disease (HCC) 02/11/2018   History of syphilis 02/11/2018   Constipation 02/11/2018   Osteoarthritis 02/11/2018   Benign essential HTN 02/11/2018   Acute encephalopathy 02/10/2018    Sallyanne Kuster, PTA, [facility] [address], [address] [place], [place] [address] [phone] 03/14/21, 4:31 PM   Name: Robert Donovan MRN: [id number] Date of Birth: 18-Apr-1956

## 2021-03-13 NOTE — Progress Notes (Signed)
Patient has eaten today and patient has taken medication. Patient denies pain at this time. 

## 2021-03-14 ENCOUNTER — Encounter: Payer: Self-pay | Admitting: Physician Assistant

## 2021-03-14 DIAGNOSIS — G8193 Hemiplegia, unspecified affecting right nondominant side: Secondary | ICD-10-CM | POA: Diagnosis not present

## 2021-03-15 ENCOUNTER — Other Ambulatory Visit: Payer: Self-pay

## 2021-03-15 ENCOUNTER — Ambulatory Visit: Payer: Medicaid Other

## 2021-03-15 DIAGNOSIS — R269 Unspecified abnormalities of gait and mobility: Secondary | ICD-10-CM

## 2021-03-15 DIAGNOSIS — M6281 Muscle weakness (generalized): Secondary | ICD-10-CM

## 2021-03-15 DIAGNOSIS — R2681 Unsteadiness on feet: Secondary | ICD-10-CM | POA: Diagnosis not present

## 2021-03-15 DIAGNOSIS — G8193 Hemiplegia, unspecified affecting right nondominant side: Secondary | ICD-10-CM | POA: Diagnosis not present

## 2021-03-15 NOTE — Therapy (Signed)
[facility] [address] [place], [place], [address] Phone: [phone]   Fax:  [phone]  Physical Therapy Treatment  Patient Details  Name: Robert Donovan MRN: [id number] Date of Birth: 03/08/1956 Referring Provider (PT): Karle Plumber MD   Encounter Date: 03/15/2021   PT End of Session - 03/15/21 1013     Visit Number 7    Number of Visits 17    Date for PT Re-Evaluation 04/15/21    Authorization Type UHC MCD    Authorization Time Period 27 visit limit combined disciplines    Authorization - Visit Number 4    Authorization - Number of Visits 27    Progress Note Due on Visit 10    PT Start Time 0935    PT Stop Time 1020    PT Time Calculation (min) 45 min    Equipment Utilized During Treatment Gait belt    Activity Tolerance Patient tolerated treatment well    Behavior During Therapy [facility] for tasks assessed/performed             Past Medical History:  Diagnosis Date   Arthritis    Bilateral Knees   High cholesterol    History of loop recorder 05/16/2019   Hypertension    Stroke ([place]) 02/10/2018    Past Surgical History:  Procedure Laterality Date   LOOP RECORDER IMPLANT  05/16/2019   TEE WITHOUT CARDIOVERSION N/A 02/16/2018   Procedure: TRANSESOPHAGEAL ECHOCARDIOGRAM (TEE);  Surgeon: Buford Dresser, MD;  Location: [facility] ENDOSCOPY;  Service: Cardiovascular;  Laterality: N/A;   TOOTH EXTRACTION Bilateral 10/19/2020   Procedure: DENTAL RESTORATION/EXTRACTIONS;  Surgeon: Diona Browner, DMD;  Location: [facility];  Service: Oral Surgery;  Laterality: Bilateral;    There were no vitals filed for this visit.   Subjective Assessment - 03/15/21 0939     Subjective No new complaints to note, no medical changes    Pertinent History 1. Hemiparesis affecting right side as late effect of cerebrovascular accident (CVA) ([place])  2. Ankle fracture, bimalleolar, closed, right, sequela  I think it is reasonable for  him to continue with PCS services due to residual right-sided weakness from stroke and gait disturbance associated with the stroke and his ankle fracture.     3. Chronic right shoulder pain  We will get an x-ray of the shoulder to see whether he has developed some arthritis in the shoulder.  Further management will be based on results.  - DG Shoulder Right; Future     4. History of syphilis  -Patient requesting follow-up visit with infectious disease specialist as he was told.  - Ambulatory referral to Infectious Disease    Limitations Walking;Standing    How long can you sit comfortably? >15 min    How long can you stand comfortably? <5 min    How long can you walk comfortably? n/a    Patient Stated Goals To improve my mobility                               [facility] Adult PT Treatment/Exercise - 03/15/21 0001       Transfers   Transfers Sit to Stand;Stand to Sit    Sit to Stand 5: Supervision;4: Min guard    Stand to Sit 5: Supervision;4: Min guard    Stand Pivot Transfers 5: Supervision;4: Min guard    Number of Reps 10 reps    Comments OH reach once standing to  fcilitate posture      Ambulation/Gait   Ambulation/Gait Yes    Ambulation/Gait Assistance 4: Min guard    Ambulation/Gait Assistance Details VCs to toe out on R to correct supination tendency    Ambulation Distance (Feet) 115 Feet    Assistive device Rolling walker    Gait Pattern Step-through pattern    Ambulation Surface Level;Indoor      Knee/Hip Exercises: Aerobic   Other Aerobic Scifit level 2.5 x 8 minutes with UE/LE's with goal >/= 50 steps per minute for strengthening and activity tolerance.  seat 19, arms 7      Manual Therapy   Manual Therapy Joint mobilization;Soft tissue mobilization    Manual therapy comments Grade 3 mobs to R ankle into DFand eversion followed by manually resisted DF and eversion for 30 reps                       PT Short Term Goals - 03/15/21 1025        PT SHORT TERM GOAL #1   Title Patient to demo initial HEP back to PT w/o need of cuing. (all STGS due 03/20/21)    Baseline TBD; 02/22/21 [id number]    Time 4    Period Weeks    Status On-going    Target Date 03/20/21      PT SHORT TERM GOAL #2   Title Patient to perform all transfers with S    Baseline Currently SBA/CGA for STS, stand pivot and bed mobility; 03/15/21 SBA for all transfers    Time 4    Period Weeks    Status Partially Met    Target Date 03/20/21      PT SHORT TERM GOAL #3   Title Patient to amulate 65ft in // bars with SBA    Baseline non ambulatory at present; 03/15/21 Ambulation of 144ft with CGA and RW    Time 4    Period Weeks    Status Achieved    Target Date 03/20/21      PT SHORT TERM GOAL #4   Title Increase RLE hip extension, abduction, knee extension and DF to 3+/5    Baseline 3/5 all regions    Time 4    Period Weeks    Status On-going    Target Date 03/20/21      PT SHORT TERM GOAL #5   Title Assess BERG and establish goal; 03/15/21 BERG goal 40    Baseline 02/20/21: baseline score of 26/56    Time 4    Period Weeks    Status On-going    Target Date 03/20/21               PT Long Term Goals - 03/15/21 1027       PT LONG TERM GOAL #1   Title Patient to demo final HEP to PT w/o cuing    Baseline TBD    Time 8    Period Weeks    Status New      PT LONG TERM GOAL #2   Title Patient to increase sterngth of RLE hip extension, abduction, knee extension and DF to 4/5    Baseline 3/5 strength all regions    Time 8    Period Weeks    Status New      PT LONG TERM GOAL #3   Title Perform TUG and establish goal    Baseline TBD    Time 8    Period Weeks  Status New      PT LONG TERM GOAL #4   Title Patient to ambulate 26f with LRAD under S    Baseline Non-ambulatory at present    Time 8    Period Weeks    Status New      PT LONG TERM GOAL #5   Title Assess progress towards BERG; 03/15/21 BERG goal is 40/56    Baseline TBD;  03/16/21 26/56    Time 8    Period Weeks    Status New                   Plan - 03/15/21 1014     Clinical Impression Statement Continued R ankle mobilization to regain mobility following immobilizaton, 4d PROM DF noted.  More volitional movement in R ankle and patient is now able t0 DFagainst gravity in seated position.  Able to extend ambulation distance to 115 ft with cues given to toe out on R to counter supination tendency.  Improved standing balance and STS capability, now able to stand and reach [facility] to facilitate postural control.    Personal Factors and Comorbidities Comorbidity 2    Comorbidities CVA, obesity    Examination-Activity Limitations Stairs;Locomotion Level;Transfers    Examination-Participation Restrictions Community Activity;Meal Prep    Stability/Clinical Decision Making Stable/Uncomplicated    Rehab Potential Good    PT Frequency 2x / week    PT Duration 8 weeks    PT Treatment/Interventions ADLs/Self Care Home Management;Aquatic Therapy;DME Instruction;Gait training;Stair training;Functional mobility training;Therapeutic activities;Therapeutic exercise;Balance training;Neuromuscular re-education;Patient/family education    PT Next Visit Plan STGs due 03/20/21 transfers and strengthening/standing balance, ankle ROM/mobs, ambulation with RW, monitor inversion tendency    PT Home Exercise Plan [id number]    Consulted and Agree with Plan of Care Patient             Patient will benefit from skilled therapeutic intervention in order to improve the following deficits and impairments:  Decreased endurance, Obesity, Decreased activity tolerance, Decreased strength, Decreased balance, Decreased mobility, Difficulty walking, Abnormal gait, Decreased safety awareness  Visit Diagnosis: Unsteadiness on feet  Muscle weakness (generalized)  Gait disorder     Problem List Patient Active Problem List   Diagnosis Date Noted   Hemiparesis affecting  right side as late effect of [place] ([place]) ([place] 09/06/[place]   Personal history of fall 09/06/2020   Chronic right shoulder pain 09/06/2020   Closed bimalleolar fracture of right ankle 07/27/2020   History of loop recorder 05/02/2020   Carotid artery disease ([place] 05/02/2020   Right hemiparesis ([place] 04/30/2020   Obesity (BMI 35.0-39.9 without comorbidity) 04/30/2020   Hyperlipidemia 04/26/2019   Hypokalemia    Acute embolic stroke ([place]    History of CVA (cerebrovascular accident) 02/11/2018   Acute kidney injury superimposed on chronic kidney disease ([place] 02/11/2018   History of syphilis 02/11/2018   Constipation 02/11/2018   Osteoarthritis 02/11/2018   Benign essential HTN 02/11/2018   Acute encephalopathy 02/10/2018    JLanice Shirts PT 03/15/2021, 10:30 AM  [facility] [address]Phone: [phone]  Fax:  [phone] Name: DDeren DegraziaMRN: [id number] of Birth: 709/14/1957

## 2021-03-18 ENCOUNTER — Ambulatory Visit (INDEPENDENT_AMBULATORY_CARE_PROVIDER_SITE_OTHER): Payer: Medicaid Other

## 2021-03-18 DIAGNOSIS — Z8673 Personal history of transient ischemic attack (TIA), and cerebral infarction without residual deficits: Secondary | ICD-10-CM

## 2021-03-18 DIAGNOSIS — G8193 Hemiplegia, unspecified affecting right nondominant side: Secondary | ICD-10-CM | POA: Diagnosis not present

## 2021-03-19 DIAGNOSIS — G8193 Hemiplegia, unspecified affecting right nondominant side: Secondary | ICD-10-CM | POA: Diagnosis not present

## 2021-03-20 ENCOUNTER — Ambulatory Visit: Payer: Medicaid Other | Admitting: Physical Therapy

## 2021-03-20 ENCOUNTER — Other Ambulatory Visit: Payer: Self-pay

## 2021-03-20 ENCOUNTER — Encounter: Payer: Self-pay | Admitting: Physical Therapy

## 2021-03-20 DIAGNOSIS — M6281 Muscle weakness (generalized): Secondary | ICD-10-CM

## 2021-03-20 DIAGNOSIS — G8193 Hemiplegia, unspecified affecting right nondominant side: Secondary | ICD-10-CM | POA: Diagnosis not present

## 2021-03-20 DIAGNOSIS — R2681 Unsteadiness on feet: Secondary | ICD-10-CM | POA: Diagnosis not present

## 2021-03-20 DIAGNOSIS — R269 Unspecified abnormalities of gait and mobility: Secondary | ICD-10-CM

## 2021-03-20 LAB — CUP PACEART REMOTE DEVICE CHECK
Date Time Interrogation Session: 20221012230922
Implantable Pulse Generator Implant Date: 20201214

## 2021-03-20 NOTE — Therapy (Signed)
[facility] [address] [place], [place], [address] Phone: [phone]   Fax:  [phone]  Physical Therapy Treatment  Patient Details  Name: Robert Donovan MRN: [id number] Date of Birth: [birth year] Referring Provider (PT): Karle Plumber MD   Encounter Date: 03/20/2021   PT End of Session - 03/20/21 0854     Visit Number 8    Number of Visits 17    Date for PT Re-Evaluation 04/15/21    Authorization Type U[employer] MCD    Authorization Time Period 27 visit limit combined disciplines    Authorization - Visit Number 6    Authorization - Number of Visits 27    Progress Note Due on Visit 10    PT Start Time 0850    PT Stop Time 0930    PT Time Calculation (min) 40 min    Equipment Utilized During Treatment Gait belt    Activity Tolerance Patient tolerated treatment well    Behavior During Therapy [facility] for tasks assessed/performed             Past Medical History:  Diagnosis Date   Arthritis    Bilateral Knees   High cholesterol    History of loop recorder 05/16/2019   Hypertension    Stroke ([facility]) 02/10/2018    Past Surgical History:  Procedure Laterality Date   LOOP RECORDER IMPLANT  05/16/2019   TEE WITHOUT CARDIOVERSION N/A 02/16/2018   Procedure: TRANSESOPHAGEAL ECHOCARDIOGRAM (TEE);  Surgeon: Buford Dresser, MD;  Location: [facility] ENDOSCOPY;  Service: Cardiovascular;  Laterality: N/A;   TOOTH EXTRACTION Bilateral 10/19/2020   Procedure: DENTAL RESTORATION/EXTRACTIONS;  Surgeon: Diona Browner, DMD;  Location: [facility];  Service: Oral Surgery;  Laterality: Bilateral;    There were no vitals filed for this visit.   Subjective Assessment - 03/20/21 0853     Subjective No new complaitns. No falls or pain to report.    Pertinent History 1. Hemiparesis affecting right side as late effect of cerebrovascular accident (CVA) ([place])  2. Ankle fracture, bimalleolar, closed, right, sequela  I think it is reasonable  for him to continue with PCS services due to residual right-sided weakness from stroke and gait disturbance associated with the stroke and his ankle fracture.     3. Chronic right shoulder pain  We will get an x-ray of the shoulder to see whether he has developed some arthritis in the shoulder.  Further management will be based on results.  - DG Shoulder Right; Future     4. History of syphilis  -Patient requesting follow-up visit with infectious disease specialist as he was told.  - Ambulatory referral to Infectious Disease    Limitations Walking;Standing    How long can you sit comfortably? >15 min    How long can you stand comfortably? <5 min    How long can you walk comfortably? n/a    Patient Stated Goals To improve my mobility    Currently in Pain? No/denies    Pain Score 0-No pain                [facility] PT Assessment - 03/20/21 0855       Strength   Right Hip Flexion 3+/5    Right Hip Extension 3+/5    Right Hip ABduction 4+/5    Right Knee Flexion 3+/5    Right Knee Extension 4-/5    Right Ankle Dorsiflexion 3+/5  [facility] Adult PT Treatment/Exercise - 03/20/21 0855       Transfers   Transfers Sit to Stand;Stand to Sit    Sit to Stand 5: Supervision;With upper extremity assist;From bed;From chair/3-in-1    Stand to Sit 5: Supervision;With upper extremity assist;To bed;To chair/3-in-1      Ambulation/Gait   Ambulation/Gait Yes    Ambulation/Gait Assistance 4: Min guard    Ambulation/Gait Assistance Details cues to keep right foot neutral/with slight toe out due to tendency to rotate inward with gait. cues on posture at times as well.    Ambulation Distance (Feet) 115 Feet   x1, plus around clinic with session   Assistive device Rolling walker    Gait Pattern Step-through pattern    Ambulation Surface Level;Indoor      Knee/Hip Exercises: Aerobic   Other Aerobic Scifit level 3.0 x 8 minutes with UE/LE's with goal >/= 50 steps per minute for  strengthening and activity tolerance.  seat 19, arms 7            Issued to HEP this session. Cues on form and technique. Access Code: [email or web address] URL: [email or web address] Date: 03/20/2021 Prepared by: Willow Ora  Exercises Ankle Inversion Eversion Towel Slide - 2 x daily - 7 x weekly - 2 sets - 10 reps Seated Heel Toe Raises - 1 x daily - 5 x weekly - 1 sets - 10 reps Seated Long Arc Quad - 1 x daily - 5 x weekly - 1 sets - 10 reps Seated March - 1 x daily - 5 x weekly - 1 sets - 10 reps        PT Education - 03/20/21 0928     Education Details progress toward goals, updated HEP    Person(s) Educated Patient    Methods Explanation;Demonstration;Verbal cues;Handout    Comprehension Verbalized understanding;Returned demonstration;Verbal cues required;Need further instruction              PT Short Term Goals - 03/20/21 0902       PT SHORT TERM GOAL #1   Title Patient to demo initial HEP back to PT w/o need of cuing. (all STGS due 03/20/21)    Baseline TBD; 02/22/21 [email or web address]    Time 4    Period Weeks    Status On-going    Target Date 03/20/21      PT SHORT TERM GOAL #2   Title Patient to perform all transfers with S    Baseline 03/15/21 SBA for all transfers    Status Partially Met    Target Date 03/20/21      PT SHORT TERM GOAL #3   Title Patient to amulate 46f in // bars with SBA    Baseline 03/15/21 Ambulation of 1155fwith CGA and RW    Status Achieved    Target Date 03/20/21      PT SHORT TERM GOAL #4   Title Increase RLE hip extension, abduction, knee extension and DF to 3+/5    Baseline 3/5 all regions    Time 4    Period Weeks    Status On-going    Target Date 03/20/21      PT SHORT TERM GOAL #5   Title 03/15/21 BERG goal 40    Baseline 02/20/21: baseline score of 26/56    Status Achieved               PT Long Term Goals - 03/15/21 1027       PT LONG TERM GOAL #1  Title Patient to demo final HEP to PT w/o cuing     Baseline TBD    Time 8    Period Weeks    Status New      PT LONG TERM GOAL #2   Title Patient to increase sterngth of RLE hip extension, abduction, knee extension and DF to 4/5    Baseline 3/5 strength all regions    Time 8    Period Weeks    Status New      PT LONG TERM GOAL #3   Title Perform TUG and establish goal    Baseline TBD    Time 8    Period Weeks    Status New      PT LONG TERM GOAL #4   Title Patient to ambulate 298f with LRAD under S    Baseline Non-ambulatory at present    Time 8    Period Weeks    Status New      PT LONG TERM GOAL #5   Title Assess progress towards BERG; 03/15/21 BERG goal is 40/56    Baseline TBD; 03/16/21 26/56    Time 8    Period Weeks    Status New              03/20/21 0854  Plan  Clinical Impression Statement Today's skilled session focused on progress toward remaining STGs with goals checked met. Also focused on updating HEP with no issues noted or reported with performance in session and continued with use of Scifit for strenthening/activity tolerance. The pt is making steady progress toward goals and should benefit from continued PT to progress toward unmet goals.  Personal Factors and Comorbidities Comorbidity 2  Comorbidities CVA, obesity  Examination-Activity Limitations Stairs;Locomotion Level;Transfers  Examination-Participation Restrictions Community Activity;Meal Prep  Pt will benefit from skilled therapeutic intervention in order to improve on the following deficits Decreased endurance;Obesity;Decreased activity tolerance;Decreased strength;Decreased balance;Decreased mobility;Difficulty walking;Abnormal gait;Decreased safety awareness  Stability/Clinical Decision Making Stable/Uncomplicated  Rehab Potential Good  PT Frequency 2x / week  PT Duration 8 weeks  PT Treatment/Interventions ADLs/Self Care Home Management;Aquatic Therapy;DME Instruction;Gait training;Stair training;Functional mobility training;Therapeutic  activities;Therapeutic exercise;Balance training;Neuromuscular re-education;Patient/family education  PT Next Visit Plan check TUG for update of LTG. transfers and strengthening/standing balance, ankle ROM/mobs, ambulation with RW, monitor inversion tendency  PT Home Exercise Plan [email or web address]  Consulted and Agree with Plan of Care Patient           Patient will benefit from skilled therapeutic intervention in order to improve the following deficits and impairments:  Decreased endurance, Obesity, Decreased activity tolerance, Decreased strength, Decreased balance, Decreased mobility, Difficulty walking, Abnormal gait, Decreased safety awareness  Visit Diagnosis: Unsteadiness on feet  Muscle weakness (generalized)  Gait disorder     Problem List Patient Active Problem List   Diagnosis Date Noted   Hemiparesis affecting right side as late effect of cerebrovascular accident ([place]) [place] 09/06/2020   Personal history of fall 09/06/2020   Chronic right shoulder pain 09/06/2020   Closed bimalleolar fracture of right ankle 07/27/2020   History of loop recorder 05/02/2020   Carotid artery disease ([place] 05/02/2020   Right hemiparesis ([place] 04/30/2020   Obesity (BMI 35.0-39.9 without comorbidity) 04/30/2020   Hyperlipidemia 04/26/2019   Hypokalemia    [place]olic stroke [place]    History of CVA (cerebrovascular accident) 02/11/2018   Acute kidney injury superimposed on chronic kidney disease ([place] 02/11/2018   History of syphilis 02/11/2018   Constipation 02/11/2018   Osteoarthritis 02/11/2018  Benign essential HTN 02/11/2018   Acute encephalopathy 02/10/2018    Willow Ora, PTA 03/20/2021, 10:25 PM  [facility] [address] [place], [place], [address] Phone: [phone]   Fax:  [phone]  Name: Robert Donovan MRN: [id number] Date of Birth: 1955/12/17

## 2021-03-20 NOTE — Patient Instructions (Signed)
Access Code: [email or web address] URL: [email or web address] Date: 03/20/2021 Prepared by: Sallyanne Kuster  Exercises Ankle Inversion Eversion Towel Slide - 2 x daily - 7 x weekly - 2 sets - 10 reps Seated Heel Toe Raises - 1 x daily - 5 x weekly - 1 sets - 10 reps Seated Long Arc Quad - 1 x daily - 5 x weekly - 1 sets - 10 reps Seated March - 1 x daily - 5 x weekly - 1 sets - 10 reps

## 2021-03-21 DIAGNOSIS — G8193 Hemiplegia, unspecified affecting right nondominant side: Secondary | ICD-10-CM | POA: Diagnosis not present

## 2021-03-22 ENCOUNTER — Ambulatory Visit: Payer: Medicaid Other

## 2021-03-22 ENCOUNTER — Other Ambulatory Visit: Payer: Self-pay

## 2021-03-22 DIAGNOSIS — G8193 Hemiplegia, unspecified affecting right nondominant side: Secondary | ICD-10-CM | POA: Diagnosis not present

## 2021-03-22 DIAGNOSIS — R2681 Unsteadiness on feet: Secondary | ICD-10-CM

## 2021-03-22 DIAGNOSIS — M6281 Muscle weakness (generalized): Secondary | ICD-10-CM

## 2021-03-22 DIAGNOSIS — R269 Unspecified abnormalities of gait and mobility: Secondary | ICD-10-CM

## 2021-03-22 NOTE — Therapy (Signed)
[facility] [address] [place], [place], [address] Phone: [phone]   Fax:  [phone]  Physical Therapy Treatment  Patient Details  Name: Robert Donovan MRN: [id number] Date of Birth: Sep 28, 1955 Referring Provider (PT): Karle Plumber MD   Encounter Date: 03/22/2021   PT End of Session - 03/22/21 0852     Visit Number 9    Number of Visits 17    Date for PT Re-Evaluation 04/15/21    Authorization Type UHC MCD    Authorization Time Period 27 visit limit combined disciplines    Authorization - Visit Number 6    Authorization - Number of Visits 27    Progress Note Due on Visit 10    PT Start Time 0850    PT Stop Time 0930    PT Time Calculation (min) 40 min    Equipment Utilized During Treatment Gait belt    Activity Tolerance Patient tolerated treatment well    Behavior During Therapy [facility] for tasks assessed/performed             Past Medical History:  Diagnosis Date   Arthritis    Bilateral Knees   High cholesterol    History of loop recorder 05/16/2019   Hypertension    Stroke ([facility]) 02/10/2018    Past Surgical History:  Procedure Laterality Date   LOOP RECORDER IMPLANT  05/16/2019   TEE WITHOUT CARDIOVERSION N/A 02/16/2018   Procedure: TRANSESOPHAGEAL ECHOCARDIOGRAM (TEE);  Surgeon: Buford Dresser, MD;  Location: [facility] ENDOSCOPY;  Service: Cardiovascular;  Laterality: N/A;   TOOTH EXTRACTION Bilateral 10/19/2020   Procedure: DENTAL RESTORATION/EXTRACTIONS;  Surgeon: Diona Browner, DMD;  Location: [facility];  Service: Oral Surgery;  Laterality: Bilateral;    There were no vitals filed for this visit.   Subjective Assessment - 03/22/21 0852     Subjective has been walking more at home, still working on obtaining a RW and may purchase one out of pocket    Pertinent History 1. Hemiparesis affecting right side as late effect of cerebrovascular accident (CVA) ([place])  2. Ankle fracture, bimalleolar,  closed, right, sequela  I think it is reasonable for him to continue with PCS services due to residual right-sided weakness from stroke and gait disturbance associated with the stroke and his ankle fracture.     3. Chronic right shoulder pain  We will get an x-ray of the shoulder to see whether he has developed some arthritis in the shoulder.  Further management will be based on results.  - DG Shoulder Right; Future     4. History of syphilis  -Patient requesting follow-up visit with infectious disease specialist as he was told.  - Ambulatory referral to Infectious Disease    Limitations Walking;Standing    How long can you sit comfortably? >15 min    How long can you stand comfortably? <5 min    How long can you walk comfortably? n/a    Patient Stated Goals To improve my mobility                               [facility] Adult PT Treatment/Exercise - 03/22/21 0001       Transfers   Transfers Sit to Stand;Stand to Sit    Sit to Stand 5: Supervision;With upper extremity assist;From bed;From chair/3-in-1    Stand to Sit 5: Supervision;With upper extremity assist;To bed;To chair/3-in-1    Number of Reps 10 reps  Comments OH reach once standing to fcilitate posture      Ambulation/Gait   Ambulation/Gait Yes    Ambulation/Gait Assistance 4: Min guard    Ambulation/Gait Assistance Details cued for posture to assist with swing through    Ambulation Distance (Feet) 115 Feet    Assistive device Rolling walker    Gait Pattern Step-through pattern    Ambulation Surface Level;Indoor      Knee/Hip Exercises: Standing   Other Standing Knee Exercises marching in walker alt. 10x each      Ankle Exercises: Standing   Heel Raises 10 reps    Heel Raises Limitations in walker                 Balance Exercises - 03/22/21 0001       Balance Exercises: Standing   Standing Eyes Opened Wide (BOA);Foam/compliant surface    Standing Eyes Opened Limitations WS forward and back,  side to side in walker    Stepping Strategy Anterior;10 reps;Limitations    Stepping Strategy Limitations step tapping to 4" step 10x per side    Sidestepping Upper extremity support;2 reps;Limitations    Sidestepping Limitations at counter for 2 trips                  PT Short Term Goals - 03/22/21 0853       PT SHORT TERM GOAL #1   Title Patient to demo initial HEP back to PT w/o need of cuing. (all STGS due 03/20/21)    Baseline 03/20/21: met with current HEP. HEP updated this session.    Status Achieved      PT SHORT TERM GOAL #2   Title Patient to perform all transfers with S    Baseline 03/20/21: met in session today    Status Achieved      PT SHORT TERM GOAL #3   Title Patient to amulate 50f in // bars with SBA    Baseline 03/15/21 Ambulation of 111fwith CGA and RW    Status Achieved      PT SHORT TERM GOAL #4   Title Increase RLE hip extension, abduction, knee extension and DF to 3+/5    Baseline 03/20/21: met with testing today    Status Achieved      PT SHORT TERM GOAL #5   Title 03/15/21 BERG goal 40    Baseline 02/20/21: baseline score of 26/56    Status Achieved               PT Long Term Goals - 03/15/21 1027       PT LONG TERM GOAL #1   Title Patient to demo final HEP to PT w/o cuing    Baseline TBD    Time 8    Period Weeks    Status New      PT LONG TERM GOAL #2   Title Patient to increase sterngth of RLE hip extension, abduction, knee extension and DF to 4/5    Baseline 3/5 strength all regions    Time 8    Period Weeks    Status New      PT LONG TERM GOAL #3   Title Perform TUG and establish goal    Baseline TBD    Time 8    Period Weeks    Status New      PT LONG TERM GOAL #4   Title Patient to ambulate 23020fith LRAD under S    Baseline Non-ambulatory at present  Time 8    Period Weeks    Status New      PT LONG TERM GOAL #5   Title Assess progress towards BERG; 03/15/21 BERG goal is 40/56    Baseline TBD;  03/16/21 26/56    Time 8    Period Weeks    Status New                   Plan - 03/22/21 0928     Clinical Impression Statement Todays session continued with gait and balance training, adding sidestepping, static stance with WS on airex, heel raises.  Able to ambuate with RW with TCs given to correct posture to allow swing through clearance on R.  Added marching in standing to facilitate SLS tolerance.  Patient continues to favor LLE and is apprehensive to bear full weight on RLE    Personal Factors and Comorbidities Comorbidity 2    Comorbidities CVA, obesity    Examination-Activity Limitations Stairs;Locomotion Level;Transfers    Examination-Participation Restrictions Community Activity;Meal Prep    Stability/Clinical Decision Making Stable/Uncomplicated    Rehab Potential Good    PT Frequency 2x / week    PT Duration 8 weeks    PT Treatment/Interventions ADLs/Self Care Home Management;Aquatic Therapy;DME Instruction;Gait training;Stair training;Functional mobility training;Therapeutic activities;Therapeutic exercise;Balance training;Neuromuscular re-education;Patient/family education    PT Next Visit Plan check TUG for update of LTG, advance to sidestepping and gait challenges in // bars, stairs?    PT Home Exercise Plan [email or web address]    Consulted and Agree with Plan of Care Patient             Patient will benefit from skilled therapeutic intervention in order to improve the following deficits and impairments:  Decreased endurance, Obesity, Decreased activity tolerance, Decreased strength, Decreased balance, Decreased mobility, Difficulty walking, Abnormal gait, Decreased safety awareness  Visit Diagnosis: Unsteadiness on feet  Muscle weakness (generalized)  Gait disorder     Problem List Patient Active Problem List   Diagnosis Date Noted   Hemiparesis affecting right side as late effect of cerebrovascular accident (CVA) ([place]) 09/06/2020   Personal history of fall  09/06/2020   Chronic right shoulder pain 09/06/2020   Closed bimalleolar fracture of right ankle 07/27/2020   History of loop recorder 05/02/2020   Carotid artery disease ([place]) 05/02/2020   Right hemiparesis ([place]) 04/30/2020   Obesity (BMI 35.0-39.9 without comorbidity) 04/30/2020   Hyperlipidemia 04/26/2019   Hypokalemia    Acute embolic stroke ([place])    History of CVA (cerebrovascular accident) 02/11/2018   Acute kidney injury superimposed on chronic kidney disease ([place]) 02/11/2018   History of syphilis 02/11/2018   Constipation 02/11/2018   Osteoarthritis 02/11/2018   Benign essential HTN 02/11/2018   Acute encephalopathy 02/10/2018    Lanice Shirts, PT 03/22/2021, 11:30 AM  [facility] [address] [place], [place], [address] Phone: [phone]   Fax:  [phone]  Name: Robert Donovan MRN: [id number] Date of Birth: 16-Feb-1956

## 2021-03-25 DIAGNOSIS — G8193 Hemiplegia, unspecified affecting right nondominant side: Secondary | ICD-10-CM | POA: Diagnosis not present

## 2021-03-26 DIAGNOSIS — G8193 Hemiplegia, unspecified affecting right nondominant side: Secondary | ICD-10-CM | POA: Diagnosis not present

## 2021-03-27 ENCOUNTER — Other Ambulatory Visit: Payer: Self-pay

## 2021-03-27 ENCOUNTER — Encounter: Payer: Self-pay | Admitting: Physical Therapy

## 2021-03-27 ENCOUNTER — Ambulatory Visit: Payer: Medicaid Other | Admitting: Physical Therapy

## 2021-03-27 DIAGNOSIS — R269 Unspecified abnormalities of gait and mobility: Secondary | ICD-10-CM

## 2021-03-27 DIAGNOSIS — G8193 Hemiplegia, unspecified affecting right nondominant side: Secondary | ICD-10-CM | POA: Diagnosis not present

## 2021-03-27 DIAGNOSIS — R2681 Unsteadiness on feet: Secondary | ICD-10-CM | POA: Diagnosis not present

## 2021-03-27 DIAGNOSIS — M6281 Muscle weakness (generalized): Secondary | ICD-10-CM

## 2021-03-27 NOTE — Progress Notes (Signed)
Carelink Summary Report / Loop Recorder 

## 2021-03-28 DIAGNOSIS — G8193 Hemiplegia, unspecified affecting right nondominant side: Secondary | ICD-10-CM | POA: Diagnosis not present

## 2021-03-28 NOTE — Therapy (Signed)
[facility] [address] [place], [place], [address] Phone: [phone]   Fax:  [phone]  Physical Therapy Treatment  Patient Details  Name: Robert Donovan MRN: [id number] Date of Birth: Dec 03, 1955 Referring Provider (PT): Karle Plumber MD   Encounter Date: 03/27/2021   PT End of Session - 03/27/21 0850     Visit Number 10    Number of Visits 17    Date for PT Re-Evaluation 04/15/21    Authorization Type [employer] MCD    Authorization Time Period 27 visit limit combined disciplines    Authorization - Visit Number 9   number updated on 10/26 visit   Authorization - Number of Visits 27    Progress Note Due on Visit --    PT Start Time 0849    PT Stop Time 0930    PT Time Calculation (min) 41 min    Equipment Utilized During Treatment Gait belt    Activity Tolerance Patient tolerated treatment well    Behavior During Therapy [facility] for tasks assessed/performed             Past Medical History:  Diagnosis Date   Arthritis    Bilateral Knees   High cholesterol    History of loop recorder 05/16/2019   Hypertension    Stroke ([place]) 02/10/2018    Past Surgical History:  Procedure Laterality Date   LOOP RECORDER IMPLANT  05/16/2019   TEE WITHOUT CARDIOVERSION N/A 02/16/2018   Procedure: TRANSESOPHAGEAL ECHOCARDIOGRAM (TEE);  Surgeon: Buford Dresser, MD;  Location: [facility] ENDOSCOPY;  Service: Cardiovascular;  Laterality: N/A;   TOOTH EXTRACTION Bilateral 10/19/2020   Procedure: DENTAL RESTORATION/EXTRACTIONS;  Surgeon: Diona Browner, DMD;  Location: [place];  Service: Oral Surgery;  Laterality: Bilateral;    There were no vitals filed for this visit.   Subjective Assessment - 03/27/21 0849     Subjective No new complaints. No falls or pain to report.    Pertinent History 1. Hemiparesis affecting right side as late effect of cerebrovascular accident (CVA) ([place])  2. Ankle fracture, bimalleolar, closed, right,  sequela  I think it is reasonable for him to continue with PCS services due to residual right-sided weakness from stroke and gait disturbance associated with the stroke and his ankle fracture.     3. Chronic right shoulder pain  We will get an x-ray of the shoulder to see whether he has developed some arthritis in the shoulder.  Further management will be based on results.  - DG Shoulder Right; Future     4. History of syphilis  -Patient requesting follow-up visit with infectious disease specialist as he was told.  - Ambulatory referral to Infectious Disease    Limitations Walking;Standing    How long can you sit comfortably? >15 min    How long can you stand comfortably? <5 min    How long can you walk comfortably? n/a    Currently in Pain? No/denies    Pain Score 0-No pain                [facility] PT Assessment - 03/27/21 0851       Standardized Balance Assessment   Standardized Balance Assessment Timed Up and Go Test      Timed Up and Go Test   TUG Normal TUG    Normal TUG (seconds) 36.68   sec's with RW               [facility] Adult PT Treatment/Exercise - 03/27/21 2505  Transfers   Transfers Sit to Stand;Stand to Sit    Sit to Stand 5: Supervision;With upper extremity assist;From bed;From chair/3-in-1    Stand to Sit 5: Supervision;With upper extremity assist;To bed;To chair/3-in-1      Ambulation/Gait   Ambulation/Gait Yes    Ambulation/Gait Assistance 4: Min guard    Ambulation/Gait Assistance Details cues for decreased toe in and increased step length with gait    Ambulation Distance (Feet) 115 Feet   x1   Assistive device Rolling walker    Gait Pattern Step-through pattern    Ambulation Surface Level;Indoor      Knee/Hip Exercises: Aerobic   Other Aerobic Scifit level 3.5 x 8 minutes with UE/LE's with goal >/= 50 steps per minute for strengthening and activity tolerance.  seat 19, arms 7                 Balance Exercises - 03/27/21 0917        Balance Exercises: Standing   SLS with Vectors Solid surface;Upper extremity assist 2;Other reps (comment);Limitations    SLS with Vectors Limitations standing with RW with 4 inch box: alternating foward foot taps, then cross foot taps for ~10 reps each. min guard assist for balance with cues on base of support and weight shifting.                  PT Short Term Goals - 03/22/21 9242       PT SHORT TERM GOAL #1   Title Patient to demo initial HEP back to PT w/o need of cuing. (all STGS due 03/20/21)    Baseline 03/20/21: met with current HEP. HEP updated this session.    Status Achieved      PT SHORT TERM GOAL #2   Title Patient to perform all transfers with S    Baseline 03/20/21: met in session today    Status Achieved      PT SHORT TERM GOAL #3   Title Patient to amulate 40f in // bars with SBA    Baseline 03/15/21 Ambulation of 1179fwith CGA and RW    Status Achieved      PT SHORT TERM GOAL #4   Title Increase RLE hip extension, abduction, knee extension and DF to 3+/5    Baseline 03/20/21: met with testing today    Status Achieved      PT SHORT TERM GOAL #5   Title 03/15/21 BERG goal 40    Baseline 02/20/21: baseline score of 26/56    Status Achieved               PT Long Term Goals - 03/28/21 1357       PT LONG TERM GOAL #1   Title Patient to demo final HEP to PT w/o cuing. (all LTGs due 04/17/21)    Baseline TBD    Time 8    Period Weeks    Status On-going      PT LONG TERM GOAL #2   Title Patient to increase sterngth of RLE hip extension, abduction, knee extension and DF to 4/5    Baseline 3/5 strength all regions    Time 8    Period Weeks    Status On-going      PT LONG TERM GOAL #3   Title Perform TUG and establish goal    Baseline 03/27/21: 36.68 sec's with RW as baseline, PT to reset goal.    Time 8    Period Weeks    Status  On-going      PT LONG TERM GOAL #4   Title Patient to ambulate 269f with LRAD under S    Baseline  Non-ambulatory at present    Time 8    Period Weeks    Status On-going      PT LONG TERM GOAL #5   Title Assess progress towards BERG; 03/15/21 BERG goal is 40/56    Baseline 03/16/21 26/56    Time 8    Period Weeks    Status On-going                   Plan - 03/27/21 0851     Clinical Impression Statement Today's skilled session initially set baseline for Timed Up and Go with primary PT to reset LTG. Remainder of session continued to focus on strengthening, balance and gait with RW. No issues noted or reported in session. The pt is making steady progress toward unmet goals.    Personal Factors and Comorbidities Comorbidity 2    Comorbidities CVA, obesity    Examination-Activity Limitations Stairs;Locomotion Level;Transfers    Examination-Participation Restrictions Community Activity;Meal Prep    Stability/Clinical Decision Making Stable/Uncomplicated    Rehab Potential Good    PT Frequency 2x / week    PT Duration 8 weeks    PT Treatment/Interventions ADLs/Self Care Home Management;Aquatic Therapy;DME Instruction;Gait training;Stair training;Functional mobility training;Therapeutic activities;Therapeutic exercise;Balance training;Neuromuscular re-education;Patient/family education    PT Next Visit Plan advance to sidestepping and gait challenges in // bars, stairs? continued to work on static standing balance with decreased UE support, LE strengthening    PT Home Exercise Plan [id number]    Consulted and Agree with Plan of Care Patient             Patient will benefit from skilled therapeutic intervention in order to improve the following deficits and impairments:  Decreased endurance, Obesity, Decreased activity tolerance, Decreased strength, Decreased balance, Decreased mobility, Difficulty walking, Abnormal gait, Decreased safety awareness  Visit Diagnosis: Unsteadiness on feet  Muscle weakness (generalized)  Gait disorder     Problem List Patient Active  Problem List   Diagnosis Date Noted   Hemiparesis affecting right side as late effect of cerebrovascular accident (CVA) ([place] 09/06/2020   Personal history of fall 09/06/2020   Chronic right shoulder pain 09/06/2020   Closed bimalleolar fracture of right ankle 07/27/2020   History of loop recorder 05/02/2020   Carotid artery disease ([place] 05/02/2020   Right hemiparesis ([place] 04/30/2020   Obesity (BMI 35.0-39.9 without comorbidity) 04/30/2020   Hyperlipidemia 04/26/2019   Hypokalemia    Acute embolic stroke ([place]    History of CVA (cerebrovascular accident) 02/11/2018   Acute kidney injury superimposed on chronic kidney disease ([place] 02/11/2018   History of syphilis 02/11/2018   Constipation 02/11/2018   Osteoarthritis 02/11/2018   Benign essential HTN 02/11/2018   Acute encephalopathy 02/10/2018    KWillow Ora PTA, CSpect[facility] HospitalOutpatient Neuro RGenesis Hospital[address] SEngelhardGCuyuna Perdido [phone]/27/22, 2:04 PM   Name: DLino WickliffMRN: [id number] of Birth: [birth year]

## 2021-03-29 ENCOUNTER — Ambulatory Visit: Payer: Medicaid Other | Admitting: Physical Therapy

## 2021-03-29 ENCOUNTER — Other Ambulatory Visit: Payer: Self-pay

## 2021-03-29 ENCOUNTER — Encounter: Payer: Self-pay | Admitting: Physical Therapy

## 2021-03-29 DIAGNOSIS — M6281 Muscle weakness (generalized): Secondary | ICD-10-CM

## 2021-03-29 DIAGNOSIS — R269 Unspecified abnormalities of gait and mobility: Secondary | ICD-10-CM

## 2021-03-29 DIAGNOSIS — R2681 Unsteadiness on feet: Secondary | ICD-10-CM | POA: Diagnosis not present

## 2021-03-29 NOTE — Therapy (Signed)
[facility] [address] [place], [place], [address] Phone: [phone]   Fax:  [phone]  Physical Therapy Treatment  Patient Details  Name: Robert Donovan MRN: [id number] Date of Birth: 07-13-1955 Referring Provider (PT): Karle Plumber MD   Encounter Date: 03/29/2021   PT End of Session - 03/29/21 0951     Visit Number 11    Number of Visits 17    Date for PT Re-Evaluation 04/15/21    Authorization Type UHC MCD    Authorization Time Period 27 visit limit combined disciplines    Authorization - Visit Number 10    Authorization - Number of Visits 27    Progress Note Due Donovan Visit 10    PT Start Time 0847    PT Stop Time 0931    PT Time Calculation (min) 44 min    Equipment Utilized During Treatment Gait belt    Activity Tolerance Patient tolerated treatment well    Behavior During Therapy [facility] for tasks assessed/performed             Past Medical History:  Diagnosis Date   Arthritis    Bilateral Knees   High cholesterol    History of loop recorder 05/16/2019   Hypertension    Stroke ([place]) 02/10/2018    Past Surgical History:  Procedure Laterality Date   LOOP RECORDER IMPLANT  05/16/2019   TEE WITHOUT CARDIOVERSION N/A 02/16/2018   Procedure: TRANSESOPHAGEAL ECHOCARDIOGRAM (TEE);  Surgeon: Buford Dresser, MD;  Location: [facility] ENDOSCOPY;  Service: Cardiovascular;  Laterality: N/A;   TOOTH EXTRACTION Bilateral 10/19/2020   Procedure: DENTAL RESTORATION/EXTRACTIONS;  Surgeon: Diona Browner, DMD;  Location: [place];  Service: Oral Surgery;  Laterality: Bilateral;    There were no vitals filed for this visit.   Subjective Assessment - 03/29/21 0849     Subjective No new complaints or falls to report. Pt ready for therapy, and reports that he should be getting his walker this Sunday.    Pertinent History 1. Hemiparesis affecting right side as late effect of cerebrovascular accident (CVA) ([place])  2.  Ankle fracture, bimalleolar, closed, right, sequela  I think it is reasonable for him to continue with PCS services due to residual right-sided weakness from stroke and gait disturbance associated with the stroke and his ankle fracture.     3. Chronic right shoulder pain  We will get an x-ray of the shoulder to see whether he has developed some arthritis in the shoulder.  Further management will be based Donovan results.  - DG Shoulder Right; Future     4. History of syphilis  -Patient requesting follow-up visit with infectious disease specialist as he was told.  - Ambulatory referral to Infectious Disease    Limitations Walking;Standing    How long can you sit comfortably? >15 min    How long can you stand comfortably? <5 min    How long can you walk comfortably? n/a    Patient Stated Goals To improve my mobility    Currently in Pain? No/denies                               [facility] Adult PT Treatment/Exercise - 03/29/21 0852       Transfers   Sit to Stand 5: Supervision;From bed;From chair/3-in-1    Stand to Sit 5: Supervision    Stand Pivot Transfers 5: Supervision;With armrests    Stand Pivot Transfer Details (indicate  cue type and reason) mat table>WC with no AD. Increased time required, but pt performed with Supervision.      Ambulation/Gait   Ambulation/Gait Yes    Ambulation/Gait Assistance 4: Min guard    Ambulation/Gait Assistance Details cues fot increased step length, more when fatigued.    Ambulation Distance (Feet) 130 Feet   around track and throughout treatment   Assistive device Rolling walker    Gait Pattern Step-through pattern;Decreased step length - right;Scissoring;Narrow base of support   Intermittent scisoring when performing turns   Ambulation Surface Level;Indoor      Knee/Hip Exercises: Aerobic   Other Aerobic Scifit level 4.0 x 8 minutes with UE/LE's ~80-90 steps per minute for strengthening and activity tolerance.  seat 20 arms 10                  Balance Exercises - 03/29/21 0927       Balance Exercises: Standing   Standing Eyes Closed Wide (BOA);Foam/compliant surface;3 reps;30 secs;Limitations    Standing Eyes Closed Limitations PT performed  3 sets of 30 sec with EC Donovan Airex with wide BOS and no UE support.Pt demonstrated minimal postural sway, but was able to maintain balance with Min Guard. Pt's Right ankle began inverting with fatigue.    Tandem Stance Eyes open;Foam/compliant surface;30 secs;Limitations;Other reps (comment)   Modified tandem stance Donovan Airex, 1 set of 30 seconds each side with EO. The pt required intermittent UE support to perform with EC. The pt's RLE began to shake with fatige when leading with the LLE, and his R ankle began to invert when leading with RLE.   Rockerboard Anterior/posterior;EO;30 seconds;Limitations    Rockerboard Limitations The pt performed 1 set of holding the board steady for 30 sec. The pt required Min Guard/Assist and the pt's RLE began to shake with fatigue.                  PT Short Term Goals - 03/22/21 9163       PT SHORT TERM GOAL #1   Title Patient to demo initial HEP back to PT w/o need of cuing. (all STGS due 03/20/21)    Baseline 03/20/21: met with current HEP. HEP updated this session.    Status Achieved      PT SHORT TERM GOAL #2   Title Patient to perform all transfers with S    Baseline 03/20/21: met in session today    Status Achieved      PT SHORT TERM GOAL #3   Title Patient to amulate 69f in // bars with SBA    Baseline 03/15/21 Ambulation of 1144fwith CGA and RW    Status Achieved      PT SHORT TERM GOAL #4   Title Increase RLE hip extension, abduction, knee extension and DF to 3+/5    Baseline 03/20/21: met with testing today    Status Achieved      PT SHORT TERM GOAL #5   Title 03/15/21 BERG goal 40    Baseline 02/20/21: baseline score of 26/56    Status Achieved               PT Long Term Goals - 03/28/21 1357       PT  LONG TERM GOAL #1   Title Patient to demo final HEP to PT w/o cuing. (all LTGs due 04/17/21)    Baseline TBD    Time 8    Period Weeks    Status Donovan-going  PT LONG TERM GOAL #2   Title Patient to increase sterngth of RLE hip extension, abduction, knee extension and DF to 4/5    Baseline 3/5 strength all regions    Time 8    Period Weeks    Status Donovan-going      PT LONG TERM GOAL #3   Title Perform TUG and establish goal    Baseline 03/27/21: 36.68 sec's with RW as baseline, PT to reset goal.    Time 8    Period Weeks    Status Donovan-going      PT LONG TERM GOAL #4   Title Patient to ambulate 228f with LRAD under S    Baseline Non-ambulatory at present    Time 8    Period Weeks    Status Donovan-going      PT LONG TERM GOAL #5   Title Assess progress towards BERG; 03/15/21 BERG goal is 40/56    Baseline 03/16/21 26/56    Time 8    Period Weeks    Status Donovan-going                   Plan - 03/29/21 0954     Clinical Impression Statement Today's skilled session was focued Donovan working Donovan LE strength and balance. The pt demonstrated improved activity tolerance by requiring only two rest breaks during the session. The pt sonctinues to deminstrated decreased step length and clearence Donovan the R during gait, and was challenged by double leg stance activities Donovan compliant/unstable surfaces. The pt could continue to benefit from continued skilled PT to address functional limitations.    Personal Factors and Comorbidities Comorbidity 2    Comorbidities CVA, obesity    Examination-Activity Limitations Stairs;Locomotion Level;Transfers    Examination-Participation Restrictions Community Activity;Meal Prep    Stability/Clinical Decision Making Stable/Uncomplicated    Rehab Potential Good    PT Frequency 2x / week    PT Duration 8 weeks    PT Treatment/Interventions ADLs/Self Care Home Management;Aquatic Therapy;DME Instruction;Gait training;Stair training;Functional mobility  training;Therapeutic activities;Therapeutic exercise;Balance training;Neuromuscular re-education;Patient/family education    PT Next Visit Plan advance to sidestepping and gait challenges in // bars, stairs? continued to work Donovan static standing balance with decreased UE support, LE strengthening. Be mindful Donovan ankle position Donovan compliant surfaces   PT Home Exercise Plan [id number]    Consulted and Agree with Plan of Care Patient             Patient will benefit from skilled therapeutic intervention in order to improve the following deficits and impairments:  Decreased endurance, Obesity, Decreased activity tolerance, Decreased strength, Decreased balance, Decreased mobility, Difficulty walking, Abnormal gait, Decreased safety awareness  Visit Diagnosis: Unsteadiness Donovan feet  Muscle weakness (generalized)  Gait disorder     Problem List Patient Active Problem List   Diagnosis Date Noted   Hemiparesis affecting right side as late effect of cerebrovascular accident (CVA) ([place] 09/06/2020   Personal history of fall 09/06/2020   Chronic right shoulder pain 09/06/2020   Closed bimalleolar fracture of right ankle 07/27/2020   History of loop recorder 05/02/2020   Carotid artery disease ([place] 05/02/2020   Right hemiparesis ([place] 04/30/2020   Obesity (BMI 35.0-39.9 without comorbidity) 04/30/2020   Hyperlipidemia 04/26/2019   Hypokalemia    Acute embolic stroke ([place]    History of CVA (cerebrovascular accident) 02/11/2018   Acute kidney injury superimposed Donovan chronic kidney disease ([place] 02/11/2018   History of syphilis 02/11/2018   Constipation 02/11/2018  Osteoarthritis 02/11/2018   Benign essential HTN 02/11/2018   Acute encephalopathy 02/10/2018    Rondel Baton 03/29/2021, 10:06 AM  [facility] [address] [place], [place], [address] Phone: [phone]   Fax:  [phone]  Name: Diyari Cherne MRN:  [id number] Date of Birth: Jan 13, 1956  This note has been reviewed and edited by supervising CI.   Willow Ora, PTA, [place] [address] [place], [place] [address] [phone] 03/31/21, 9:21 PM

## 2021-04-02 DIAGNOSIS — G8193 Hemiplegia, unspecified affecting right nondominant side: Secondary | ICD-10-CM | POA: Diagnosis not present

## 2021-04-02 DIAGNOSIS — R531 Weakness: Secondary | ICD-10-CM | POA: Diagnosis not present

## 2021-04-02 NOTE — Addendum Note (Signed)
Addended by: Geralyn Flash D on: 04/02/2021 01:12 PM   Modules accepted: Level of Service

## 2021-04-03 ENCOUNTER — Ambulatory Visit: Payer: Medicaid Other | Attending: Internal Medicine | Admitting: Physical Therapy

## 2021-04-03 ENCOUNTER — Encounter: Payer: Self-pay | Admitting: Physical Therapy

## 2021-04-03 ENCOUNTER — Other Ambulatory Visit: Payer: Self-pay

## 2021-04-03 DIAGNOSIS — R2681 Unsteadiness on feet: Secondary | ICD-10-CM

## 2021-04-03 DIAGNOSIS — M6281 Muscle weakness (generalized): Secondary | ICD-10-CM

## 2021-04-03 DIAGNOSIS — R269 Unspecified abnormalities of gait and mobility: Secondary | ICD-10-CM

## 2021-04-03 DIAGNOSIS — G8193 Hemiplegia, unspecified affecting right nondominant side: Secondary | ICD-10-CM | POA: Diagnosis not present

## 2021-04-03 NOTE — Therapy (Signed)
[facility] [address] [place], [place], [address] Phone: [phone]   Fax:  [phone]  Physical Therapy Treatment  Patient Details  Name: Robert Donovan MRN: [id number] Date of Birth: [birth year] Referring Provider (PT): Karle Plumber MD   Encounter Date: 04/03/2021   PT End of Session - 04/03/21 0958     Visit Number 12    Number of Visits 17    Date for PT Re-Evaluation 04/15/21    Authorization Type [employer] MCD    Authorization Time Period 27 visit limit combined disciplines    Authorization - Visit Number 11    Authorization - Number of Visits 27    Progress Note Due on Visit 10    PT Start Time 0845    PT Stop Time 0930    PT Time Calculation (min) 45 min    Equipment Utilized During Treatment Gait belt    Activity Tolerance Patient tolerated treatment well    Behavior During Therapy [facility] for tasks assessed/performed             Past Medical History:  Diagnosis Date   Arthritis    Bilateral Knees   High cholesterol    History of loop recorder 05/16/2019   Hypertension    Stroke ([place]) 02/10/2018    Past Surgical History:  Procedure Laterality Date   LOOP RECORDER IMPLANT  05/16/2019   TEE WITHOUT CARDIOVERSION N/A 02/16/2018   Procedure: TRANSESOPHAGEAL ECHOCARDIOGRAM (TEE);  Surgeon: Buford Dresser, MD;  Location: [facility] ENDOSCOPY;  Service: Cardiovascular;  Laterality: N/A;   TOOTH EXTRACTION Bilateral 10/19/2020   Procedure: DENTAL RESTORATION/EXTRACTIONS;  Surgeon: Diona Browner, DMD;  Location: [facility];  Service: Oral Surgery;  Laterality: Bilateral;    There were no vitals filed for this visit.   Subjective Assessment - 04/03/21 0848     Subjective No falls or pain this morning. The pt reports that he got his RW and has been using it at home. Pt reports his RLE shaking with fatigue occaisionally.    Pertinent History 1. Hemiparesis affecting right side as late effect of cerebrovascular  accident (CVA) ([place])  2. Ankle fracture, bimalleolar, closed, right, sequela  I think it is reasonable for him to continue with PCS services due to residual right-sided weakness from stroke and gait disturbance associated with the stroke and his ankle fracture.     3. Chronic right shoulder pain  We will get an x-ray of the shoulder to see whether he has developed some arthritis in the shoulder.  Further management will be based on results.  - DG Shoulder Right; Future     4. History of syphilis  -Patient requesting follow-up visit with infectious disease specialist as he was told.  - Ambulatory referral to Infectious Disease    Limitations Walking;Standing    How long can you sit comfortably? >15 min    How long can you stand comfortably? <5 min    How long can you walk comfortably? n/a    Patient Stated Goals To improve my mobility    Currently in Pain? No/denies                               [facility] Adult PT Treatment/Exercise - 04/03/21 0853       Transfers   Sit to Stand 5: Supervision;4: Min guard;4: Min assist;From elevated surface    Sit to Stand Details Tactile cues for weight shifting;Tactile cues for  placement;Verbal cues for technique    Sit to Stand Details (indicate cue type and reason) Sit>stand from WC<>mat table supervision with RW. Pt performed repeated sit<>stands from elevated mat table with no UE support and with LEs parallel, and in staggered stance with each LE forward. Pt required verbal/tactile cues for LE placement, verbal/tactile cues for anterior weight shifting, and min guard/assist. Pt occaisionally required multipe attempts to transition from sit>stand w/o UE support, but improved with repetition. Pt performed 5 reps with LE parallel, 5 reps with LLE forward, and 10 reps with RLE forward.    Stand to Sit 5: Supervision;4: Min guard      Ambulation/Gait   Ambulation/Gait Yes    Ambulation/Gait Assistance 4: Min guard    Ambulation/Gait Assistance  Details cues for increased step length and heel strike on R    Ambulation Distance (Feet) 260 Feet   x2 laps around track and during session   Assistive device Rolling walker    Gait Pattern Step-through pattern;Decreased step length - right;Narrow base of support;Decreased stance time - right;Right foot flat    Ambulation Surface Level;Indoor      High Level Balance   High Level Balance Activities Side stepping    High Level Balance Comments Pt performed side stepping in // bars for 2 laps of 10' in both directions. Pt demonstrated increased truncal lean to the L and R foot drifting posteriorly when stepping with R LE. Pt required min cues to maintain R foot pointed forward and level with LLE when leading with the R.                 Balance Exercises - 04/03/21 0921       Balance Exercises: Standing   Standing Eyes Opened Wide (BOA);Foam/compliant surface;1 rep;30 secs;Limitations    Standing Eyes Opened Limitations Pt performed 1 rep for 30 sec with no UE support on Airex. Pt demonstrated minimal postural sway, but was able to maintain balance.    Standing Eyes Closed Wide (BOA);Foam/compliant surface;30 secs;Limitations;1 rep    Standing Eyes Closed Limitations pt performed 1 rept of 30 seconds with EC and no UE sopport on Airex. The pt demonstrated 1 posterior LOB, but was able to regain balance with brief UE support.    Tandem Stance --    Other Standing Exercises tapping 4" step in // bars    Other Standing Exercises Comments Alternating LE taps on 4" step for 10 reps with B UE support, and 1o reps with single UE support. Pt required min guard and demonstrated decreased dorsiflesion and active knee flexion on R, and increase left truncal lean when tapping with the R.                  PT Short Term Goals - 03/22/21 0853       PT SHORT TERM GOAL #1   Title Patient to demo initial HEP back to PT w/o need of cuing. (all STGS due 03/20/21)    Baseline 03/20/21: met with  current HEP. HEP updated this session.    Status Achieved      PT SHORT TERM GOAL #2   Title Patient to perform all transfers with S    Baseline 03/20/21: met in session today    Status Achieved      PT SHORT TERM GOAL #3   Title Patient to amulate 64f in // bars with SBA    Baseline 03/15/21 Ambulation of 1154fwith CGA and RW    Status  Achieved      PT SHORT TERM GOAL #4   Title Increase RLE hip extension, abduction, knee extension and DF to 3+/5    Baseline 03/20/21: met with testing today    Status Achieved      PT SHORT TERM GOAL #5   Title 03/15/21 BERG goal 40    Baseline 02/20/21: baseline score of 26/56    Status Achieved               PT Long Term Goals - 03/28/21 1357       PT LONG TERM GOAL #1   Title Patient to demo final HEP to PT w/o cuing. (all LTGs due 04/17/21)    Baseline TBD    Time 8    Period Weeks    Status On-going      PT LONG TERM GOAL #2   Title Patient to increase sterngth of RLE hip extension, abduction, knee extension and DF to 4/5    Baseline 3/5 strength all regions    Time 8    Period Weeks    Status On-going      PT LONG TERM GOAL #3   Title Perform TUG and establish goal    Baseline 03/27/21: 36.68 sec's with RW as baseline, PT to reset goal.    Time 8    Period Weeks    Status On-going      PT LONG TERM GOAL #4   Title Patient to ambulate 270f with LRAD under S    Baseline Non-ambulatory at present    Time 8    Period Weeks    Status On-going      PT LONG TERM GOAL #5   Title Assess progress towards BERG; 03/15/21 BERG goal is 40/56    Baseline 03/16/21 26/56    Time 8    Period Weeks    Status On-going                   Plan - 04/03/21 0959     Clinical Impression Statement Today's skilled session was focused on gait, functional LE strength, and balance. Pt c/o fatigue during session, but only required 2 rest breaks and demonstrated no shaking in RLE. The pt performed sit<>stands with no UE support  from an elevated surface, stating "I haven't been able to do that since my stroke." The pt could benefit from continued skilled PT to address all remaining functional limitations.    Personal Factors and Comorbidities Comorbidity 2    Comorbidities CVA, obesity    Examination-Activity Limitations Stairs;Locomotion Level;Transfers    Examination-Participation Restrictions Community Activity;Meal Prep    Stability/Clinical Decision Making Stable/Uncomplicated    Rehab Potential Good    PT Frequency 2x / week    PT Duration 8 weeks    PT Treatment/Interventions ADLs/Self Care Home Management;Aquatic Therapy;DME Instruction;Gait training;Stair training;Functional mobility training;Therapeutic activities;Therapeutic exercise;Balance training;Neuromuscular re-education;Patient/family education    PT Next Visit Plan Continue with sit<>stands w/o UE support, static/dynamic balance in // bars, and LE strengthening.    PT Home Exercise Plan [id number]    Consulted and Agree with Plan of Care Patient             Patient will benefit from skilled therapeutic intervention in order to improve the following deficits and impairments:  Decreased endurance, Obesity, Decreased activity tolerance, Decreased strength, Decreased balance, Decreased mobility, Difficulty walking, Abnormal gait, Decreased safety awareness  Visit Diagnosis: Unsteadiness on feet  Muscle weakness (generalized)  Gait disorder  Problem List Patient Active Problem List   Diagnosis Date Noted   Hemiparesis affecting right side as late effect of cerebrovascular accident (CVA) ([place]) 09/06/2020   Personal history of fall 09/06/2020   Chronic right shoulder pain 09/06/2020   Closed bimalleolar fracture of right ankle 07/27/2020   History of loop recorder 05/02/2020   Carotid artery disease ([place]) 05/02/2020   Right hemiparesis ([place]) 04/30/2020   Obesity (BMI 35.0-39.9 without comorbidity) 04/30/2020   Hyperlipidemia 04/26/2019    Hypokalemia    Acute embolic stroke ([place])    History of CVA (cerebrovascular accident) 02/11/2018   Acute kidney injury superimposed on chronic kidney disease ([place]) 02/11/2018   History of syphilis 02/11/2018   Constipation 02/11/2018   Osteoarthritis 02/11/2018   Benign essential HTN 02/11/2018   Acute encephalopathy 02/10/2018    Rondel Baton, SPTA 04/03/2021, 1:40 PM  [facility] [address] [place], [place], [address] Phone: [phone]   Fax:  [phone]  Name: Arvel Oquinn MRN: [id number] Date of Birth: 03/24/1956

## 2021-04-04 ENCOUNTER — Ambulatory Visit: Payer: Self-pay

## 2021-04-04 ENCOUNTER — Other Ambulatory Visit: Payer: Self-pay

## 2021-04-04 DIAGNOSIS — G8193 Hemiplegia, unspecified affecting right nondominant side: Secondary | ICD-10-CM | POA: Diagnosis not present

## 2021-04-04 NOTE — Patient Outreach (Signed)
Medicaid Managed Care Social Work Note  04/04/2021 Name:  Robert Donovan MRN:  [id number] DOB:  1956/04/30  Robert Donovan is an 65 y.o. year old male who is a primary patient of [name], MD.  The Medicaid Managed Care Coordination team was consulted for assistance with:  Community Resources   Mr. Geers was given information about Medicaid Managed Care Coordination team services today. [name] agreed to services and verbal consent obtained.  Engaged with patient  for by telephone forfollow up visit in response to referral for case management and/or care coordination services.   Assessments/Interventions:  Review of past medical history, allergies, medications, health status, including review of consultants reports, laboratory and other test data, was performed as part of comprehensive evaluation and provision of chronic care management services.  SDOH: (Social Determinant of Health) assessments and interventions performed: BSW contacted and spoke with patients mom she stated he still has an aide coming in that he likes. He is currently in therapy and getting better. Mom states he still wants a scooter and a blood pressure cuff.   Advanced Directives Status:  Not addressed in this encounter.  Care Plan                 Allergies  Allergen Reactions   Hydrocodone     Pt stated, "I am not allergic, I do not want to take this medicine ever" Codeine - "I do not want to ever take this medicine - not allergic"   Oxycodone     Pt stated, "I am not allergic, I do not want to take this medicine ever"    Medications Reviewed Today     Reviewed by Ruben Gottron (Student-Physical Therapist Assistant) on 04/03/21 at 0847  Med List Status: <None>   Medication Order Taking? Sig Documenting Provider Last Dose Status Informant  amLODipine (NORVASC) 5 MG tablet [id number] No Take 1 tablet (5 mg total) by mouth daily. [name], MD Taking Active   aspirin EC 81 MG  tablet [id number] No Take 1 tablet (81 mg total) by mouth daily. [name], MD Taking Active   atorvastatin (LIPITOR) 40 MG tablet [id number] No Take 1 tablet (40 mg total) by mouth every evening. [name], MD Taking Active   calcium-vitamin D (OSCAL WITH D) 500-200 MG-UNIT tablet [id number] No Take 1 tablet by mouth daily. [provider] Taking Active Self  lisinopril-hydrochlorothiazide (ZESTORETIC) 20-25 MG tablet [id number] No TAKE 2 TABLETS BY MOUTH DAILY FOR BLOOD PRESSURE [name], MD Taking Active   Misc. Devices MISC [id number] No Large blood pressure cuff device For home blood pressure monitoring [name], MD Taking Active   Multiple Vitamins-Minerals (MENS MULTIVITAMIN PLUS PO) [id number] No Take 1 tablet by mouth daily. [provider] Taking Active Self  naproxen (NAPROSYN) 250 MG tablet [id number] No Take 500 mg by mouth 2 (two) times daily with a meal. [provider] Taking Active Self  vitamin B-12 (CYANOCOBALAMIN) 500 MCG tablet [id number] No Take 1 tablet (500 mcg total) by mouth daily. [name], MD Taking Active             Patient Active Problem List   Diagnosis Date Noted   Hemiparesis affecting right side as late effect of cerebrovascular accident (CVA) ([facility]) 09/06/2020   Personal history of fall 09/06/2020   Chronic right shoulder pain 09/06/2020   Closed bimalleolar fracture of right ankle 07/27/2020   History of loop recorder 05/02/2020   Carotid  artery disease ([facility]) 05/02/2020   Right hemiparesis ([facility]) 04/30/2020   Obesity (BMI 35.0-39.9 without comorbidity) 04/30/2020   Hyperlipidemia 04/26/2019   Hypokalemia    Acute embolic stroke [facility])    History of CVA (cerebrovascular accident) 02/11/2018   Acute kidney injury superimposed on chronic kidney disease ([facility]) 02/11/2018   History of syphilis 02/11/2018   Constipation 02/11/2018   Osteoarthritis 02/11/2018   Benign essential HTN  02/11/2018   Acute encephalopathy 02/10/2018    Conditions to be addressed/monitored per PCP order:   blood pressure cuff  There are no care plans that you recently modified to display for this patient.   Follow up:  Patient agrees to Care Plan and Follow-up.  Plan: The Managed Medicaid care management team will reach out to the patient again over the next 30 days.  Date/time of next scheduled Social Work care management/care coordination outreach:  05/03/21  [name], Kenard Gower, [facility]  [facility]  High Risk Managed Medicaid Team  [phone]

## 2021-04-04 NOTE — Patient Instructions (Signed)
Visit Information  Mr. Blanda was given information about Medicaid Managed Care team care coordination services as a part of their [facility] Community Plan Medicaid benefit. [name] verbally consented to engagement with the [facility] Managed Care team.   If you are experiencing a medical emergency, please call 911 or report to your local emergency department or urgent care.   If you have a non-emergency medical problem during routine business hours, please contact your provider's office and ask to speak with a nurse.   For questions related to your [facility], please call: [phone] or visit the homepage here: [email or web address]  If you would like to schedule transportation through your [facility], please call the following number at least 2 days in advance of your appointment: [phone].   Call the Behavioral Health Crisis Line at [phone], at any time, 24 hours a day, 7 days a week. If you are in danger or need immediate medical attention call 911.  If you would like help to quit smoking, call 1-800-QUIT-NOW (7733540574) OR Espaol: 1-855-Djelo-Ya (9-735-329-9242) o para ms informacin haga clic aqu or Text READY to 683-419 to register via text  [name] - following are the goals we discussed in your visit today:   Goals Addressed   None      The Managed Medicaid care management team will reach out to the patient again over the next 30 days.   [name], BSW, [place] [facility]  [facility]  High Risk Managed Medicaid Team  [phone]   Following is a copy of your plan of care:  There are no care plans that you recently modified to display for this patient.

## 2021-04-05 ENCOUNTER — Other Ambulatory Visit: Payer: Self-pay

## 2021-04-05 ENCOUNTER — Ambulatory Visit: Payer: Medicaid Other

## 2021-04-05 DIAGNOSIS — M6281 Muscle weakness (generalized): Secondary | ICD-10-CM

## 2021-04-05 DIAGNOSIS — R2681 Unsteadiness on feet: Secondary | ICD-10-CM

## 2021-04-05 DIAGNOSIS — R269 Unspecified abnormalities of gait and mobility: Secondary | ICD-10-CM

## 2021-04-05 DIAGNOSIS — G8193 Hemiplegia, unspecified affecting right nondominant side: Secondary | ICD-10-CM | POA: Diagnosis not present

## 2021-04-05 NOTE — Therapy (Signed)
[facility] [address] [place], [place], [address] Phone: [phone]   Fax:  [phone]  Physical Therapy Treatment  Patient Details  Name: Robert Donovan MRN: [id number] Date of Birth: 1956/05/16 Referring Provider (PT): Karle Plumber MD   Encounter Date: 04/05/2021    Past Medical History:  Diagnosis Date   Arthritis    Bilateral Knees   High cholesterol    History of loop recorder 05/16/2019   Hypertension    Stroke ([place]) 02/10/2018    Past Surgical History:  Procedure Laterality Date   LOOP RECORDER IMPLANT  05/16/2019   TEE WITHOUT CARDIOVERSION N/A 02/16/2018   Procedure: TRANSESOPHAGEAL ECHOCARDIOGRAM (TEE);  Surgeon: Buford Dresser, MD;  Location: [facility] ENDOSCOPY;  Service: Cardiovascular;  Laterality: N/A;   TOOTH EXTRACTION Bilateral 10/19/2020   Procedure: DENTAL RESTORATION/EXTRACTIONS;  Surgeon: Diona Browner, DMD;  Location: [facility];  Service: Oral Surgery;  Laterality: Bilateral;    There were no vitals filed for this visit.   Subjective Assessment - 04/05/21 0849     Subjective No new c/o RLE shaking decreased, has been using RW at home ~ 19f    Pertinent History 1. Hemiparesis affecting right side as late effect of cerebrovascular accident (CVA) ([place]  2. Ankle fracture, bimalleolar, closed, right, sequela  I think it is reasonable for him to continue with PCS services due to residual right-sided weakness from stroke and gait disturbance associated with the stroke and his ankle fracture.     3. Chronic right shoulder pain  We will get an x-ray of the shoulder to see whether he has developed some arthritis in the shoulder.  Further management will be based on results.  - DG Shoulder Right; Future     4. History of syphilis  -Patient requesting follow-up visit with infectious disease specialist as he was told.  - Ambulatory referral to Infectious Disease    Limitations Walking;Standing    How  long can you sit comfortably? >15 min    How long can you stand comfortably? <5 min    How long can you walk comfortably? n/a    Patient Stated Goals To improve my mobility                               [facility] Adult PT Treatment/Exercise - 04/05/21 0001       Transfers   Sit to Stand 5: Supervision;4: Min guard;4: Min assist;From elevated surface    Stand to Sit 5: Supervision;4: Min guard    Comments 10x from airex with CGA/SBA      Ambulation/Gait   Ambulation/Gait Yes    Ambulation/Gait Assistance 4: Min guard;5: Supervision    Ambulation/Gait Assistance Details posture and breathing cues    Ambulation Distance (Feet) 250 Feet    Assistive device Rolling walker    Gait Pattern Step-through pattern;Decreased step length - right;Narrow base of support;Decreased stance time - right;Right foot flat    Ambulation Surface Level;Indoor      Knee/Hip Exercises: Aerobic   Other Aerobic Scifit level 4.0 x 8 minutes with UE/LE's ~80-90 steps per minute for strengthening and activity tolerance.  seat 21 arms 7      Knee/Hip Exercises: Seated   Long Arc Quad Strengthening;Both;1 set;15 reps;Limitations    Long Arc Quad Limitations alt with lat press    Marching Right;Left;1 set;15 reps;Limitations    Marching Limitations alt with lat press  Balance Exercises - 04/05/21 0001       Balance Exercises: Standing   SLS with Vectors --    SLS with Vectors Limitations --      Balance Exercises: Seated   Other Seated Exercises OH ball raise with 2KG ball, 15x followed by hip tosses 10x B                  PT Short Term Goals - 03/22/21 5701       PT SHORT TERM GOAL #1   Title Patient to demo initial HEP back to PT w/o need of cuing. (all STGS due 03/20/21)    Baseline 03/20/21: met with current HEP. HEP updated this session.    Status Achieved      PT SHORT TERM GOAL #2   Title Patient to perform all transfers with S    Baseline  03/20/21: met in session today    Status Achieved      PT SHORT TERM GOAL #3   Title Patient to amulate 93f in // bars with SBA    Baseline 03/15/21 Ambulation of 1131fwith CGA and RW    Status Achieved      PT SHORT TERM GOAL #4   Title Increase RLE hip extension, abduction, knee extension and DF to 3+/5    Baseline 03/20/21: met with testing today    Status Achieved      PT SHORT TERM GOAL #5   Title 03/15/21 BERG goal 40    Baseline 02/20/21: baseline score of 26/56    Status Achieved               PT Long Term Goals - 03/28/21 1357       PT LONG TERM GOAL #1   Title Patient to demo final HEP to PT w/o cuing. (all LTGs due 04/17/21)    Baseline TBD    Time 8    Period Weeks    Status On-going      PT LONG TERM GOAL #2   Title Patient to increase sterngth of RLE hip extension, abduction, knee extension and DF to 4/5    Baseline 3/5 strength all regions    Time 8    Period Weeks    Status On-going      PT LONG TERM GOAL #3   Title Perform TUG and establish goal    Baseline 03/27/21: 36.68 sec's with RW as baseline, PT to reset goal.    Time 8    Period Weeks    Status On-going      PT LONG TERM GOAL #4   Title Patient to ambulate 23066fith LRAD under S    Baseline Non-ambulatory at present    Time 8    Period Weeks    Status On-going      PT LONG TERM GOAL #5   Title Assess progress towards BERG; 03/15/21 BERG goal is 40/56    Baseline 03/16/21 26/56    Time 8    Period Weeks    Status On-going                   Plan - 04/05/21 0923     Clinical Impression Statement Today session focused on aerobic training followed by ambulation of 250f18fth RW, added seated activities to activate core, incorporating functional tasks of LAQs and marching.  Advanced to seated OH raises and hip tosses using weighted ball.  Progressing well towards goals    Personal Factors and  Comorbidities Comorbidity 2    Comorbidities CVA, obesity     Examination-Activity Limitations Stairs;Locomotion Level;Transfers    Examination-Participation Restrictions Community Activity;Meal Prep    Stability/Clinical Decision Making Stable/Uncomplicated    Rehab Potential Good    PT Frequency 2x / week    PT Duration 8 weeks    PT Treatment/Interventions ADLs/Self Care Home Management;Aquatic Therapy;DME Instruction;Gait training;Stair training;Functional mobility training;Therapeutic activities;Therapeutic exercise;Balance training;Neuromuscular re-education;Patient/family education    PT Next Visit Plan Continue with sit<>stands w/o UE support, static/dynamic balance in // bars, and LE strengthening, stepping tasks and core/sitting balance challenges    PT Home Exercise Plan [id number]    Consulted and Agree with Plan of Care Patient             Patient will benefit from skilled therapeutic intervention in order to improve the following deficits and impairments:  Decreased endurance, Obesity, Decreased activity tolerance, Decreased strength, Decreased balance, Decreased mobility, Difficulty walking, Abnormal gait, Decreased safety awareness  Visit Diagnosis: Unsteadiness on feet  Muscle weakness (generalized)  Gait disorder     Problem List Patient Active Problem List   Diagnosis Date Noted   Hemiparesis affecting right side as late effect of cerebrovascular accident (CVA) ([place]) 09/06/2020   Personal history of fall 09/06/2020   Chronic right shoulder pain 09/06/2020   Closed bimalleolar fracture of right ankle 07/27/2020   History of loop recorder 05/02/2020   Carotid artery disease ([place]) 05/02/2020   Right hemiparesis ([place]) 04/30/2020   Obesity (BMI 35.0-39.9 without comorbidity) 04/30/2020   Hyperlipidemia 04/26/2019   Hypokalemia    Acute embolic stroke ([place])    History of CVA (cerebrovascular accident) 02/11/2018   Acute kidney injury superimposed on chronic kidney disease ([place]) 02/11/2018   History of syphilis 02/11/2018    Constipation 02/11/2018   Osteoarthritis 02/11/2018   Benign essential HTN 02/11/2018   Acute encephalopathy 02/10/2018    Lanice Shirts, PT 04/05/2021, 9:29 AM  [facility] [address] [place], [place], [address] Phone: [phone]   Fax:  [phone]  Name: Robert Donovan MRN: [id number] Date of Birth: Aug 04, 1955

## 2021-04-08 DIAGNOSIS — G8193 Hemiplegia, unspecified affecting right nondominant side: Secondary | ICD-10-CM | POA: Diagnosis not present

## 2021-04-09 DIAGNOSIS — G8193 Hemiplegia, unspecified affecting right nondominant side: Secondary | ICD-10-CM | POA: Diagnosis not present

## 2021-04-10 ENCOUNTER — Encounter: Payer: Self-pay | Admitting: Physical Therapy

## 2021-04-10 ENCOUNTER — Other Ambulatory Visit: Payer: Self-pay

## 2021-04-10 ENCOUNTER — Ambulatory Visit: Payer: Medicaid Other | Admitting: Physical Therapy

## 2021-04-10 DIAGNOSIS — G8193 Hemiplegia, unspecified affecting right nondominant side: Secondary | ICD-10-CM | POA: Diagnosis not present

## 2021-04-10 DIAGNOSIS — M6281 Muscle weakness (generalized): Secondary | ICD-10-CM

## 2021-04-10 DIAGNOSIS — R2681 Unsteadiness on feet: Secondary | ICD-10-CM | POA: Diagnosis not present

## 2021-04-10 DIAGNOSIS — R269 Unspecified abnormalities of gait and mobility: Secondary | ICD-10-CM

## 2021-04-10 NOTE — Therapy (Signed)
[facility] [address] [place], [place], [address] Phone: [phone]   Fax:  [phone]  Physical Therapy Treatment  Patient Details  Name: Robert Donovan MRN: [id number] Date of Birth: 1955-09-06 Referring Provider (PT): Karle Plumber MD   Encounter Date: 04/10/2021   PT End of Session - 04/10/21 1042     Visit Number 14    Number of Visits 17    Date for PT Re-Evaluation 04/15/21    Authorization Type [employer] MCD    Authorization Time Period 27 visit limit combined disciplines    Authorization - Visit Number 12    Authorization - Number of Visits 27    Progress Note Due on Visit 10    PT Start Time 0850    PT Stop Time 0930    PT Time Calculation (min) 40 min    Equipment Utilized During Treatment Gait belt    Activity Tolerance Patient tolerated treatment well    Behavior During Therapy [facility] for tasks assessed/performed             Past Medical History:  Diagnosis Date   Arthritis    Bilateral Knees   High cholesterol    History of loop recorder 05/16/2019   Hypertension    Stroke ([place]) 02/10/2018    Past Surgical History:  Procedure Laterality Date   LOOP RECORDER IMPLANT  05/16/2019   TEE WITHOUT CARDIOVERSION N/A 02/16/2018   Procedure: TRANSESOPHAGEAL ECHOCARDIOGRAM (TEE);  Surgeon: Buford Dresser, MD;  Location: [facility] ENDOSCOPY;  Service: Cardiovascular;  Laterality: N/A;   TOOTH EXTRACTION Bilateral 10/19/2020   Procedure: DENTAL RESTORATION/EXTRACTIONS;  Surgeon: Diona Browner, DMD;  Location: [facility];  Service: Oral Surgery;  Laterality: Bilateral;    There were no vitals filed for this visit.   Subjective Assessment - 04/10/21 0853     Subjective No new complaints, pain, or falls to report. "This right leg hasn't been shaking like it used to."    Pertinent History 1. Hemiparesis affecting right side as late effect of cerebrovascular accident (CVA) ([place])  2. Ankle fracture,  bimalleolar, closed, right, sequela  I think it is reasonable for him to continue with PCS services due to residual right-sided weakness from stroke and gait disturbance associated with the stroke and his ankle fracture.     3. Chronic right shoulder pain  We will get an x-ray of the shoulder to see whether he has developed some arthritis in the shoulder.  Further management will be based on results.  - DG Shoulder Right; Future     4. History of syphilis  -Patient requesting follow-up visit with infectious disease specialist as he was told.  - Ambulatory referral to Infectious Disease    Limitations Walking;Standing    How long can you sit comfortably? >15 min    How long can you stand comfortably? <5 min    How long can you walk comfortably? n/a    Patient Stated Goals To improve my mobility    Currently in Pain? No/denies                               [facility] Adult PT Treatment/Exercise - 04/10/21 0855       Transfers   Sit to Stand 5: Supervision;4: Min guard;4: Min assist;From elevated surface    Sit to Stand Details Verbal cues for technique    Sit to Stand Details (indicate cue type and reason) Pt performed  sit<>stands from an elevated surface with no UE support on solid and compliant surface (airex) 5 times each. The pt required verbal cues for anterior weight shifting and erect posture in standing. Pt required occaisional multiple attempts to stand with no UE support, but was able to perform safely, with min guard.      Ambulation/Gait   Ambulation/Gait Assistance 4: Min guard;5: Supervision    Ambulation/Gait Assistance Details cues for posture, increasing step length, and straightening up R foot.    Ambulation Distance (Feet) 250 Feet    Assistive device Rolling walker    Gait Pattern Step-through pattern;Decreased step length - right;Narrow base of support;Decreased stance time - right;Right foot flat;Poor foot clearance - right   "toeing in" of R foot    Ambulation Surface Level;Indoor      Neuro Re-ed    Neuro Re-ed Details  Pt positioned in RW with red balance beam. Using RW for B UE support, the pt performed stepping over beam  and back 10 times each side. The pt demonstrated decreased foot clearence, step height, and dorsiflexion when stepping with the R. The pt demonstrated frequent "catching" of the beam when stepping forward and back. Pt transitioned to // bars with the 4" step. The pt performed alternating LE tapping with B UE support. Pt demonstrated mildly ataxic movements and decreased dorsiflexion with the R LE.                       PT Short Term Goals - 03/22/21 0853       PT SHORT TERM GOAL #1   Title Patient to demo initial HEP back to PT w/o need of cuing. (all STGS due 03/20/21)    Baseline 03/20/21: met with current HEP. HEP updated this session.    Status Achieved      PT SHORT TERM GOAL #2   Title Patient to perform all transfers with S    Baseline 03/20/21: met in session today    Status Achieved      PT SHORT TERM GOAL #3   Title Patient to amulate 34f in // bars with SBA    Baseline 03/15/21 Ambulation of 1151fwith CGA and RW    Status Achieved      PT SHORT TERM GOAL #4   Title Increase RLE hip extension, abduction, knee extension and DF to 3+/5    Baseline 03/20/21: met with testing today    Status Achieved      PT SHORT TERM GOAL #5   Title 03/15/21 BERG goal 40    Baseline 02/20/21: baseline score of 26/56    Status Achieved               PT Long Term Goals - 03/28/21 1357       PT LONG TERM GOAL #1   Title Patient to demo final HEP to PT w/o cuing. (all LTGs due 04/17/21)    Baseline TBD    Time 8    Period Weeks    Status On-going      PT LONG TERM GOAL #2   Title Patient to increase sterngth of RLE hip extension, abduction, knee extension and DF to 4/5    Baseline 3/5 strength all regions    Time 8    Period Weeks    Status On-going      PT LONG TERM GOAL #3    Title Perform TUG and establish goal    Baseline 03/27/21: 36.68 sec's with  RW as baseline, PT to reset goal.    Time 8    Period Weeks    Status On-going      PT LONG TERM GOAL #4   Title Patient to ambulate 287f with LRAD under S    Baseline Non-ambulatory at present    Time 8    Period Weeks    Status On-going      PT LONG TERM GOAL #5   Title Assess progress towards BERG; 03/15/21 BERG goal is 40/56    Baseline 03/16/21 26/56    Time 8    Period Weeks    Status On-going                   Plan - 04/10/21 1044     Clinical Impression Statement Today's skilled session was focused on functional strengthening, gait with the RW, and exercises to increase step length/height. The pt could continue to benefit from skilled PT to address remaining functional deficits.    Personal Factors and Comorbidities Comorbidity 2    Comorbidities CVA, obesity    Examination-Activity Limitations Stairs;Locomotion Level;Transfers    Examination-Participation Restrictions Community Activity;Meal Prep    Stability/Clinical Decision Making Stable/Uncomplicated    Rehab Potential Good    PT Frequency 2x / week    PT Duration 8 weeks    PT Treatment/Interventions ADLs/Self Care Home Management;Aquatic Therapy;DME Instruction;Gait training;Stair training;Functional mobility training;Therapeutic activities;Therapeutic exercise;Balance training;Neuromuscular re-education;Patient/family education    PT Next Visit Plan Pt has 2 appt's left on current schedule-begin to check LTGs at next session with primary PT to assist with determining recert vs discharge at the next visit afterwards. Continue with sit<>stands w/o UE support from flower heights, static/dynamic balance in // bars, and LE strengthening, dorsiflexion strengthening, stepping tasks and core/sitting balance challenges    PT Home Exercise Plan [id number]    Consulted and Agree with Plan of Care Patient             Patient will  benefit from skilled therapeutic intervention in order to improve the following deficits and impairments:  Decreased endurance, Obesity, Decreased activity tolerance, Decreased strength, Decreased balance, Decreased mobility, Difficulty walking, Abnormal gait, Decreased safety awareness  Visit Diagnosis: Unsteadiness on feet  Muscle weakness (generalized)  Gait disorder     Problem List Patient Active Problem List   Diagnosis Date Noted   Hemiparesis affecting right side as late effect of cerebrovascular accident (CVA) ([place] 09/06/2020   Personal history of fall 09/06/2020   Chronic right shoulder pain 09/06/2020   Closed bimalleolar fracture of right ankle 07/27/2020   History of loop recorder 05/02/2020   Carotid artery disease ([place] 05/02/2020   Right hemiparesis ([place] 04/30/2020   Obesity (BMI 35.0-39.9 without comorbidity) 04/30/2020   Hyperlipidemia 04/26/2019   Hypokalemia    Acute embolic stroke ([place]    History of CVA (cerebrovascular accident) 02/11/2018   Acute kidney injury superimposed on chronic kidney disease ([place] 02/11/2018   History of syphilis 02/11/2018   Constipation 02/11/2018   Osteoarthritis 02/11/2018   Benign essential HTN 02/11/2018   Acute encephalopathy 02/10/2018    KRondel Baton SPTA 04/10/2021, 10:54 AM  [facility] [address]Phone: [phone]  Fax:  [phone] Name: DNowell SitesMRN: [id number] of Birth: [birth year]

## 2021-04-11 DIAGNOSIS — G8193 Hemiplegia, unspecified affecting right nondominant side: Secondary | ICD-10-CM | POA: Diagnosis not present

## 2021-04-11 NOTE — Addendum Note (Signed)
Addended by: Hildred Laser on: 04/11/2021 10:45 AM   Modules accepted: Orders

## 2021-04-12 ENCOUNTER — Ambulatory Visit: Payer: Medicaid Other

## 2021-04-12 DIAGNOSIS — G8193 Hemiplegia, unspecified affecting right nondominant side: Secondary | ICD-10-CM | POA: Diagnosis not present

## 2021-04-15 DIAGNOSIS — G8193 Hemiplegia, unspecified affecting right nondominant side: Secondary | ICD-10-CM | POA: Diagnosis not present

## 2021-04-16 ENCOUNTER — Telehealth: Payer: Self-pay

## 2021-04-16 DIAGNOSIS — G8193 Hemiplegia, unspecified affecting right nondominant side: Secondary | ICD-10-CM | POA: Diagnosis not present

## 2021-04-16 NOTE — Telephone Encounter (Signed)
Per [facility], they received a denial for this power wheelchair and are in process discussing options with patient.

## 2021-04-17 LAB — CUP PACEART REMOTE DEVICE CHECK
Date Time Interrogation Session: 20221114230708
Implantable Pulse Generator Implant Date: 20201214

## 2021-04-18 DIAGNOSIS — G8193 Hemiplegia, unspecified affecting right nondominant side: Secondary | ICD-10-CM | POA: Diagnosis not present

## 2021-04-19 ENCOUNTER — Ambulatory Visit: Payer: Medicaid Other | Admitting: Physical Therapy

## 2021-04-19 ENCOUNTER — Other Ambulatory Visit: Payer: Self-pay

## 2021-04-19 ENCOUNTER — Encounter: Payer: Self-pay | Admitting: Physical Therapy

## 2021-04-19 DIAGNOSIS — R2681 Unsteadiness on feet: Secondary | ICD-10-CM

## 2021-04-19 DIAGNOSIS — G8193 Hemiplegia, unspecified affecting right nondominant side: Secondary | ICD-10-CM | POA: Diagnosis not present

## 2021-04-19 DIAGNOSIS — M6281 Muscle weakness (generalized): Secondary | ICD-10-CM

## 2021-04-19 DIAGNOSIS — R269 Unspecified abnormalities of gait and mobility: Secondary | ICD-10-CM

## 2021-04-19 NOTE — Therapy (Addendum)
[facility] [address] [place], [place], [address] Phone: [phone]   Fax:  [phone]  Physical Therapy Treatment  Patient Details  Name: Robert Donovan MRN: [id number] Date of Birth: 1956-05-05 Referring Provider (PT): Karle Plumber MD   Encounter Date: 04/19/2021   PT End of Session - 04/19/21 1038     Visit Number 15    Number of Visits 17    Date for PT Re-Evaluation 04/15/21    Authorization Type UHC MCD    Authorization Time Period 27 visit limit combined disciplines    Authorization - Visit Number 13    Authorization - Number of Visits 27    Progress Note Due on Visit 10    PT Start Time 0931    PT Stop Time 1020    PT Time Calculation (min) 49 min    Equipment Utilized During Treatment Gait belt    Activity Tolerance Patient tolerated treatment well    Behavior During Therapy [facility] for tasks assessed/performed             Past Medical History:  Diagnosis Date   Arthritis    Bilateral Knees   High cholesterol    History of loop recorder 05/16/2019   Hypertension    Stroke ([place]) 02/10/2018    Past Surgical History:  Procedure Laterality Date   LOOP RECORDER IMPLANT  05/16/2019   TEE WITHOUT CARDIOVERSION N/A 02/16/2018   Procedure: TRANSESOPHAGEAL ECHOCARDIOGRAM (TEE);  Surgeon: Buford Dresser, MD;  Location: [facility] ENDOSCOPY;  Service: Cardiovascular;  Laterality: N/A;   TOOTH EXTRACTION Bilateral 10/19/2020   Procedure: DENTAL RESTORATION/EXTRACTIONS;  Surgeon: Diona Browner, DMD;  Location: [facility];  Service: Oral Surgery;  Laterality: Bilateral;    There were no vitals filed for this visit.   Subjective Assessment - 04/19/21 0936     Subjective No new complaints, pain, or falls to report. "I think I overdid it the other day, and my R foot started hurting. But it's better today."    Pertinent History 1. Hemiparesis affecting right side as late effect of cerebrovascular accident  (CVA) ([place])  2. Ankle fracture, bimalleolar, closed, right, sequela  I think it is reasonable for him to continue with PCS services due to residual right-sided weakness from stroke and gait disturbance associated with the stroke and his ankle fracture.     3. Chronic right shoulder pain  We will get an x-ray of the shoulder to see whether he has developed some arthritis in the shoulder.  Further management will be based on results.  - DG Shoulder Right; Future     4. History of syphilis  -Patient requesting follow-up visit with infectious disease specialist as he was told.  - Ambulatory referral to Infectious Disease    Limitations Walking;Standing    How long can you sit comfortably? >15 min    How long can you stand comfortably? <5 min    How long can you walk comfortably? n/a    Patient Stated Goals To improve my mobility    Currently in Pain? No/denies                [facility] PT Assessment - 04/19/21 4010       Strength   Right Hip Extension 2/5    Right Hip ABduction 3+/5    Right Knee Extension 4/5    Right Ankle Dorsiflexion 3+/5      Transfers   Stand to Sit 5: Supervision      Ambulation/Gait  Ambulation/Gait Assistance 5: Supervision    Ambulation Distance (Feet) --   during session   Assistive device Rolling walker    Gait Pattern Step-through pattern;Decreased step length - right;Narrow base of support;Decreased stance time - right;Right foot flat;Poor foot clearance - right    Ambulation Surface Level;Indoor      Berg Balance Test   Sit to Stand Able to stand  independently using hands    Standing Unsupported Able to stand 2 minutes with supervision    Sitting with Back Unsupported but Feet Supported on Floor or Stool Able to sit safely and securely 2 minutes    Stand to Sit Controls descent by using hands    Transfers Able to transfer with verbal cueing and /or supervision    Standing Unsupported with Eyes Closed Able to stand 10 seconds with supervision     Standing Unsupported with Feet Together Needs help to attain position but able to stand for 30 seconds with feet together   needed UE support to get feet together, but could hold with no UE support for 30 sec with supervision.   From Standing, Reach Forward with Outstretched Arm Reaches forward but needs supervision    From Standing Position, Pick up Object from [place] to pick up shoe, needs supervision    From Standing Position, Turn to Look Behind Over each Shoulder Turn sideways only but maintains balance    Turn 360 Degrees Able to turn 360 degrees safely but slowly    Standing Unsupported, Alternately Place Feet on Step/Stool Able to complete >2 steps/needs minimal assist    Standing Unsupported, One Foot in Front Able to plae foot ahead of the other independently and hold 30 seconds    Standing on One Leg Tries to lift leg/unable to hold 3 seconds but remains standing independently    Total Score 32    Berg comment: <36 high risk for falls      Timed Up and Go Test   TUG Normal TUG    Normal TUG (seconds) 43.27   43.27 first trial 49.25 second trial   TUG Comments >13.5 seconds indicates high fall risk               [facility] Adult PT Treatment/Exercise - 04/19/21 0938       Bed Mobility   Bed Mobility Sit to Supine;Rolling Left;Left Sidelying to Sit   L sidelying to prone: pt required min guard for safety   Rolling Left Supervision/Verbal cueing    Left Sidelying to Sit Independent    Sit to Supine Independent      Transfers   Sit to Stand 5: Supervision;4: Min guard;4: Min assist;From elevated surface                PT Education - 04/19/21 1037     Education Details Results of TUG and Berg Balance tests from today's session.    Person(s) Educated Patient    Methods Explanation    Comprehension Verbalized understanding              PT Short Term Goals - 03/22/21 0853       PT SHORT TERM GOAL #1   Title Patient to demo initial HEP back to PT w/o need  of cuing. (all STGS due 03/20/21)    Baseline 03/20/21: met with current HEP. HEP updated this session.    Status Achieved      PT SHORT TERM GOAL #2   Title Patient to perform all transfers  with S    Baseline 03/20/21: met in session today    Status Achieved      PT SHORT TERM GOAL #3   Title Patient to amulate 81f in // bars with SBA    Baseline 03/15/21 Ambulation of 1114fwith CGA and RW    Status Achieved      PT SHORT TERM GOAL #4   Title Increase RLE hip extension, abduction, knee extension and DF to 3+/5    Baseline 03/20/21: met with testing today    Status Achieved      PT SHORT TERM GOAL #5   Title 03/15/21 BERG goal 40    Baseline 02/20/21: baseline score of 26/56    Status Achieved              PT Long Term Goals - 04/19/21 1041       PT LONG TERM GOAL #1   Title Patient to demo final HEP to PT w/o cuing. (all LTGs due 04/17/21)    Baseline I with HEP    Time --    Period --    Status Achieved      PT LONG TERM GOAL #2   Title Patient to increase sterngth of RLE hip extension, abduction, knee extension and DF to 4/5    Baseline 04/19/2021: Knee ext 4/5, DF 3+/5, R hip abd 3+/5, and R hip ext 2/5    Status Not Met      PT LONG TERM GOAL #3   Title Perform TUG and establish goal    Baseline 03/27/21: 36.68 sec's with RW as baseline, PT to reset goal. 04/19/2021: 43.27 sec with RW scored today. no goal was set from 03/27/21 baseline score. new baseline today required more time.    Time --    Period --    Status Not Met      PT LONG TERM GOAL #4   Title Patient to ambulate 23059fith LRAD under S    Baseline 04/10/2021: pt ambulated 250 ft with RW under supervision/min guard    Time --    Period --    Status Partially Met      PT LONG TERM GOAL #5   Title Assess progress towards BERG; 03/15/21 BERG goal is 40/56    Baseline 04/19/2021: 32/56, improved just not to goal level    Time --    Period --    Status Partially Met                   Plan - 04/19/21 1040     Clinical Impression Statement Today's skilled session was focused on checking LTGs for recert. Pt has met 2/5 goals, partially met 2/5 goals, and not met 1/5 goals. The pt is making functional progess and could continue to benefit from further skilled PT to address remaining functional deficits.    Personal Factors and Comorbidities Comorbidity 2    Comorbidities CVA, obesity    Examination-Activity Limitations Stairs;Locomotion Level;Transfers    Examination-Participation Restrictions Community Activity;Meal Prep    Stability/Clinical Decision Making Stable/Uncomplicated    Rehab Potential Good    PT Frequency 2x / week    PT Duration 8 weeks    PT Treatment/Interventions ADLs/Self Care Home Management;Aquatic Therapy;DME Instruction;Gait training;Stair training;Functional mobility training;Therapeutic activities;Therapeutic exercise;Balance training;Neuromuscular re-education;Patient/family education    PT Next Visit Plan Submit for recert. Continue with sit<>stands w/o UE support from flower heights, static/dynamic balance in // bars, and LE strengthening/stretching, dorsiflexion strengthening, stepping tasks and  core/sitting balance challenges    PT Home Exercise Plan [email or web address]    Consulted and Agree with Plan of Care Patient             Patient will benefit from skilled therapeutic intervention in order to improve the following deficits and impairments:  Decreased endurance, Obesity, Decreased activity tolerance, Decreased strength, Decreased balance, Decreased mobility, Difficulty walking, Abnormal gait, Decreased safety awareness  Visit Diagnosis: Unsteadiness on feet  Muscle weakness (generalized)  Gait disorder     Problem List Patient Active Problem List   Diagnosis Date Noted   Hemiparesis affecting right side as late effect of cerebrovascular accident (CVA) ([place]) 09/06/2020   Personal history of fall 09/06/2020   Chronic right  shoulder pain 09/06/2020   Closed bimalleolar fracture of right ankle 07/27/2020   History of loop recorder 05/02/2020   Carotid artery disease ([place]) 05/02/2020   Right hemiparesis ([place]) 04/30/2020   Obesity (BMI 35.0-39.9 without comorbidity) 04/30/2020   Hyperlipidemia 04/26/2019   Hypokalemia    Acute embolic stroke ([place])    History of CVA (cerebrovascular accident) 02/11/2018   Acute kidney injury superimposed on chronic kidney disease ([place]) 02/11/2018   History of syphilis 02/11/2018   Constipation 02/11/2018   Osteoarthritis 02/11/2018   Benign essential HTN 02/11/2018   Acute encephalopathy 02/10/2018    Rondel Baton, SPTA 04/19/2021, 11:01 AM  [facility] [address] [place], [place], [address] Phone: [phone]   Fax:  [phone]  Name: Robert Donovan MRN: [id number] Date of Birth: 1955-08-05  This note has been reviewed and edited by supervising CI.   Willow Ora, PTA, [place] [address] [place], [place] [address] [phone] 04/19/21, 1:54 PM   I have reviewed the above documentation/note and attest to the re-certification Robert Donovan PT

## 2021-04-19 NOTE — Addendum Note (Signed)
Addended by: Hildred Laser on: 04/19/2021 03:18 PM   Modules accepted: Orders

## 2021-04-22 ENCOUNTER — Ambulatory Visit (INDEPENDENT_AMBULATORY_CARE_PROVIDER_SITE_OTHER): Payer: Medicaid Other

## 2021-04-22 DIAGNOSIS — I639 Cerebral infarction, unspecified: Secondary | ICD-10-CM

## 2021-04-22 DIAGNOSIS — G8193 Hemiplegia, unspecified affecting right nondominant side: Secondary | ICD-10-CM | POA: Diagnosis not present

## 2021-04-23 DIAGNOSIS — G8193 Hemiplegia, unspecified affecting right nondominant side: Secondary | ICD-10-CM | POA: Diagnosis not present

## 2021-04-24 DIAGNOSIS — G8193 Hemiplegia, unspecified affecting right nondominant side: Secondary | ICD-10-CM | POA: Diagnosis not present

## 2021-04-26 DIAGNOSIS — G8193 Hemiplegia, unspecified affecting right nondominant side: Secondary | ICD-10-CM | POA: Diagnosis not present

## 2021-04-29 DIAGNOSIS — G8193 Hemiplegia, unspecified affecting right nondominant side: Secondary | ICD-10-CM | POA: Diagnosis not present

## 2021-04-30 DIAGNOSIS — G8193 Hemiplegia, unspecified affecting right nondominant side: Secondary | ICD-10-CM | POA: Diagnosis not present

## 2021-04-30 NOTE — Progress Notes (Signed)
Carelink Summary Report / Loop Recorder 

## 2021-05-01 ENCOUNTER — Ambulatory Visit: Payer: Medicaid Other | Admitting: Physical Therapy

## 2021-05-01 DIAGNOSIS — G8193 Hemiplegia, unspecified affecting right nondominant side: Secondary | ICD-10-CM | POA: Diagnosis not present

## 2021-05-01 NOTE — Addendum Note (Signed)
Addended by: Hildred Laser on: 05/01/2021 10:21 AM   Modules accepted: Orders

## 2021-05-02 DIAGNOSIS — R531 Weakness: Secondary | ICD-10-CM | POA: Diagnosis not present

## 2021-05-02 DIAGNOSIS — G8193 Hemiplegia, unspecified affecting right nondominant side: Secondary | ICD-10-CM | POA: Diagnosis not present

## 2021-05-03 ENCOUNTER — Other Ambulatory Visit: Payer: Self-pay

## 2021-05-03 ENCOUNTER — Ambulatory Visit: Payer: Medicaid Other | Attending: Internal Medicine

## 2021-05-03 DIAGNOSIS — R2681 Unsteadiness on feet: Secondary | ICD-10-CM

## 2021-05-03 DIAGNOSIS — R269 Unspecified abnormalities of gait and mobility: Secondary | ICD-10-CM | POA: Diagnosis present

## 2021-05-03 DIAGNOSIS — R262 Difficulty in walking, not elsewhere classified: Secondary | ICD-10-CM | POA: Diagnosis present

## 2021-05-03 DIAGNOSIS — M6281 Muscle weakness (generalized): Secondary | ICD-10-CM

## 2021-05-03 DIAGNOSIS — R2689 Other abnormalities of gait and mobility: Secondary | ICD-10-CM | POA: Insufficient documentation

## 2021-05-03 DIAGNOSIS — G8193 Hemiplegia, unspecified affecting right nondominant side: Secondary | ICD-10-CM | POA: Diagnosis not present

## 2021-05-03 NOTE — Patient Instructions (Signed)
Visit Information  Robert Donovan was given information about Medicaid Managed Care team care coordination services as a part of their [facility] Community Plan Medicaid benefit. Robert Donovan verbally consented to engagement with the [facility] Managed Care team.   If you are experiencing a medical emergency, please call 911 or report to your local emergency department or urgent care.   If you have a non-emergency medical problem during routine business hours, please contact your provider's office and ask to speak with a nurse.   For questions related to your [facility], please call: [phone] or visit the homepage here: [email or web address]  If you would like to schedule transportation through your [facility], please call the following number at least 2 days in advance of your appointment: [phone].   Call the Behavioral Health Crisis Line at [phone], at any time, 24 hours a day, 7 days a week. If you are in danger or need immediate medical attention call 911.  If you would like help to quit smoking, call 1-800-QUIT-NOW (316-008-1233) OR Espaol: 1-855-Djelo-Ya (2-536-644-0347) o para ms informacin haga clic aqu or Text READY to 425-956 to register via text  Robert Donovan - following are the goals we discussed in your visit today:   Goals Addressed   None     Social Worker will follow up in 30 days .   Robert Donovan, BSW, [place] [facility]  [name]  High Risk Managed Medicaid Team  [phone]   Following is a copy of your plan of care:  There are no care plans that you recently modified to display for this patient.

## 2021-05-03 NOTE — Therapy (Signed)
[facility] [address] [place], [place], [address] Phone: [phone]   Fax:  [phone]  Physical Therapy Treatment  Patient Details  Name: Robert Donovan MRN: [id number] Date of Birth: 24-Sep-1955 Referring Provider (PT): Karle Plumber MD   Encounter Date: 05/03/2021   PT End of Session - 05/03/21 0852     Visit Number 16    Number of Visits 25    Date for PT Re-Evaluation 05/13/21    Authorization Type UHC MCD    Authorization Time Period 27 visit limit combined disciplines, 04/10/21-05/13/21    Authorization - Visit Number 14    Authorization - Number of Visits 27    Progress Note Due on Visit 10    PT Start Time 0846    PT Stop Time 0929    PT Time Calculation (min) 43 min    Equipment Utilized During Treatment Gait belt    Activity Tolerance Patient tolerated treatment well    Behavior During Therapy [facility] for tasks assessed/performed             Past Medical History:  Diagnosis Date   Arthritis    Bilateral Knees   High cholesterol    History of loop recorder 05/16/2019   Hypertension    Stroke ([place]) 02/10/2018    Past Surgical History:  Procedure Laterality Date   LOOP RECORDER IMPLANT  05/16/2019   TEE WITHOUT CARDIOVERSION N/A 02/16/2018   Procedure: TRANSESOPHAGEAL ECHOCARDIOGRAM (TEE);  Surgeon: Buford Dresser, MD;  Location: [facility] ENDOSCOPY;  Service: Cardiovascular;  Laterality: N/A;   TOOTH EXTRACTION Bilateral 10/19/2020   Procedure: DENTAL RESTORATION/EXTRACTIONS;  Surgeon: Diona Browner, DMD;  Location: [facility];  Service: Oral Surgery;  Laterality: Bilateral;    There were no vitals filed for this visit.   Subjective Assessment - 05/03/21 0850     Subjective Patient reports no new changes/complaints. No falls to report. Reports he was exercises and the foot started to bother him,  believes he over did it.    Pertinent History 1. Hemiparesis affecting right side as late effect of  cerebrovascular accident (CVA) ([place])  2. Ankle fracture, bimalleolar, closed, right, sequela  I think it is reasonable for him to continue with PCS services due to residual right-sided weakness from stroke and gait disturbance associated with the stroke and his ankle fracture.     3. Chronic right shoulder pain  We will get an x-ray of the shoulder to see whether he has developed some arthritis in the shoulder.  Further management will be based on results.  - DG Shoulder Right; Future     4. History of syphilis  -Patient requesting follow-up visit with infectious disease specialist as he was told.  - Ambulatory referral to Infectious Disease    Limitations Walking;Standing    How long can you sit comfortably? >15 min    How long can you stand comfortably? <5 min    How long can you walk comfortably? n/a    Patient Stated Goals To improve my mobility    Currently in Pain? No/denies                               [facility] Adult PT Treatment/Exercise - 05/03/21 0001       Transfers   Transfers Sit to Stand;Stand to Sit    Sit to Stand 5: Supervision;4: Min guard;From elevated surface    Stand to Sit 5: Supervision  Number of Reps 2 sets;Other reps (comment)   x 5 reps with 1st set, x 7 reps with second set, with BUE on legs. Completed without pushing from RW and mat. Intermittent cues to push up tall.     Ambulation/Gait   Ambulation/Gait Yes    Ambulation/Gait Assistance 5: Supervision    Ambulation/Gait Assistance Details conitnued cues for posture with ambulation and cues to step and land on R heel to promote improved heel toe pattern.    Ambulation Distance (Feet) 230 Feet    Assistive device Rolling walker    Gait Pattern Step-through pattern;Decreased step length - right;Narrow base of support;Decreased stance time - right;Right foot flat;Poor foot clearance - right    Ambulation Surface Level;Indoor      Neuro Re-ed    Neuro Re-ed Details  Standing in RW with BUE  support: completed alternating toe taps to cones x 10 reps with initial set, then progressed to second set working on reduced UE support only able to complete x 5 reps, increased challenge with clearing RLE from cone. Cues for control with completion. Seated on green air disc: completed working on engaging core and maintaining upright position x 1 minute, then added in chalenge of alternating hip marching/LAQ 2 x 5 reps each on BLE. increased challenge with RLE. Then completed reaching task seated on green air disc, reaching in various directions x 15 reps. CGA.      Exercises   Exercises Other Exercises    Other Exercises  Completed B Hamstring Stretch from seated position, 2 x 30 seconds, with cues on form.               PT Short Term Goals - 03/22/21 7591       PT SHORT TERM GOAL #1   Title Patient to demo initial HEP back to PT w/o need of cuing. (all STGS due 03/20/21)    Baseline 03/20/21: met with current HEP. HEP updated this session.    Status Achieved      PT SHORT TERM GOAL #2   Title Patient to perform all transfers with S    Baseline 03/20/21: met in session today    Status Achieved      PT SHORT TERM GOAL #3   Title Patient to amulate 22f in // bars with SBA    Baseline 03/15/21 Ambulation of 1155fwith CGA and RW    Status Achieved      PT SHORT TERM GOAL #4   Title Increase RLE hip extension, abduction, knee extension and DF to 3+/5    Baseline 03/20/21: met with testing today    Status Achieved      PT SHORT TERM GOAL #5   Title 03/15/21 BERG goal 40    Baseline 02/20/21: baseline score of 26/56    Status Achieved               PT Long Term Goals - 04/19/21 1041       PT LONG TERM GOAL #1   Title Patient to demo final HEP to PT w/o cuing. (all LTGs due 04/17/21)    Baseline I with HEP    Time 4    Period Weeks    Status Achieved    Target Date 05/13/21      PT LONG TERM GOAL #2   Title Patient to increase sterngth of RLE hip extension,  abduction, knee extension and DF to 4/5    Baseline 04/19/2021: Knee ext 4/5, DF 3+/5, R  hip abd 3+/5, and R hip ext 2/5    Time 4    Period Weeks    Status Not Met    Target Date 05/13/21      PT LONG TERM GOAL #3   Title Perform TUG and establish goal    Baseline 03/27/21: 36.68 sec's with RW as baseline, PT to reset goal. 04/19/2021: 43.27 sec with RW scored today. no goal was set from 03/27/21 baseline score. new baseline today required more time.    Time 4    Period Weeks    Status Not Met    Target Date 05/13/21      PT LONG TERM GOAL #4   Title Patient to ambulate 251f with LRAD under S    Baseline 04/10/2021: pt ambulated 250 ft with RW under supervision/min guard    Time 4    Period Weeks    Status Partially Met    Target Date 05/13/21      PT LONG TERM GOAL #5   Title Assess progress towards BERG; 03/15/21 BERG goal is 40/56    Baseline 04/19/2021: 32/56, improved just not to goal level    Time 4    Period Weeks    Status Partially Met    Target Date 05/13/21                   Plan - 05/03/21 0930     Clinical Impression Statement Continued sit <> stand training from lower mat working on reduced UE support, with patient able to complete. Continue to get fatigued in RLE requiring seated rest break. Increasred challenge with dynamic sitting balance on green disc requiring CGA from PT intermittent. Will continue per POC.    Personal Factors and Comorbidities Comorbidity 2    Comorbidities CVA, obesity    Examination-Activity Limitations Stairs;Locomotion Level;Transfers    Examination-Participation Restrictions Community Activity;Meal Prep    Stability/Clinical Decision Making Stable/Uncomplicated    Rehab Potential Good    PT Frequency 2x / week    PT Duration 8 weeks    PT Treatment/Interventions ADLs/Self Care Home Management;Aquatic Therapy;DME Instruction;Gait training;Stair training;Functional mobility training;Therapeutic activities;Therapeutic  exercise;Balance training;Neuromuscular re-education;Patient/family education    PT Next Visit Plan Continue with sit<>stands w/o UE support from flower heights, static/dynamic balance in // bars, and LE strengthening/stretching, dorsiflexion strengthening, stepping tasks and core/sitting balance challenges (increased challenge with this)    PT Home Exercise Plan [email or web address]    Consulted and Agree with Plan of Care Patient             Patient will benefit from skilled therapeutic intervention in order to improve the following deficits and impairments:  Decreased endurance, Obesity, Decreased activity tolerance, Decreased strength, Decreased balance, Decreased mobility, Difficulty walking, Abnormal gait, Decreased safety awareness  Visit Diagnosis: Unsteadiness on feet  Muscle weakness (generalized)     Problem List Patient Active Problem List   Diagnosis Date Noted   Hemiparesis affecting right side as late effect of cerebrovascular accident (CVA) ([place] 09/06/2020   Personal history of fall 09/06/2020   Chronic right shoulder pain 09/06/2020   Closed bimalleolar fracture of right ankle 07/27/2020   History of loop recorder 05/02/2020   Carotid artery disease ([place] 05/02/2020   Right hemiparesis ([place] 04/30/2020   Obesity (BMI 35.0-39.9 without comorbidity) 04/30/2020   Hyperlipidemia 04/26/2019   Hypokalemia    Acute embolic stroke ([place]    History of CVA (cerebrovascular accident) 02/11/2018   Acute kidney injury superimposed on chronic kidney disease (  [place]) 02/11/2018   History of syphilis 02/11/2018   Constipation 02/11/2018   Osteoarthritis 02/11/2018   Benign essential HTN 02/11/2018   Acute encephalopathy 02/10/2018    Jones Bales, PT, DPT 05/03/2021, 9:32 AM  [facility] [address] [place], [place], [address] Phone: [phone]   Fax:  [phone]  Name: Robert Donovan MRN: [id number] Date of  Birth: 1955-07-17

## 2021-05-03 NOTE — Patient Outreach (Signed)
Medicaid Managed Care Social Work Note  05/03/2021 Name:  Robert Donovan MRN:  [id number] DOB:  [birth year]  Robert Donovan is an 65 y.o. year old male who is a primary patient of Robert Matar, MD.  The Medicaid Managed Care Coordination team was consulted for assistance with:  Community Resources   [name] was given information about Medicaid Managed Care Coordination team services today. [name] agreed to services and verbal consent obtained.  Engaged with patient  for by telephone forfollow up visit in response to referral for case management and/or care coordination services.   Assessments/Interventions:  Review of past medical history, allergies, medications, health status, including review of consultants reports, laboratory and other test data, was performed as part of comprehensive evaluation and provision of chronic care management services.  SDOH: (Social Determinant of Health) assessments and interventions performed: BSW contacted and spoke with patient's mother, she stated patient was doing well but may want to switch his insurance plan. BSW informed her that she would have to contact and speak with DSS in order to get it switched over. Mom also states that she would like to know what other options patient has due to his power wheelchair being denied.  Advanced Directives Status:  Not addressed in this encounter.  Care Plan                 Allergies  Allergen Reactions   Hydrocodone     Pt stated, "I am not allergic, I do not want to take this medicine ever" Codeine - "I do not want to ever take this medicine - not allergic"   Oxycodone     Pt stated, "I am not allergic, I do not want to take this medicine ever"    Medications Reviewed Today     Reviewed by Robert Donovan, PT (Physical Therapist) on 05/03/21 at 0850  Med List Status: <None>   Medication Order Taking? Sig Documenting Provider Last Dose Status Informant  amLODipine (NORVASC) 5 MG tablet  [id number] No Take 1 tablet (5 mg total) by mouth daily. Robert Matar, MD Taking Active   aspirin EC 81 MG tablet [id number] No Take 1 tablet (81 mg total) by mouth daily. Robert Matar, MD Taking Active   atorvastatin (LIPITOR) 40 MG tablet [id number] No Take 1 tablet (40 mg total) by mouth every evening. Robert Matar, MD Taking Active   calcium-vitamin D (OSCAL WITH D) 500-200 MG-UNIT tablet [id number] No Take 1 tablet by mouth daily. [provider] Taking Active Self  lisinopril-hydrochlorothiazide (ZESTORETIC) 20-25 MG tablet [id number] No TAKE 2 TABLETS BY MOUTH DAILY FOR BLOOD PRESSURE Robert Matar, MD Taking Active   Misc. Devices MISC [id number] No Large blood pressure cuff device For home blood pressure monitoring Robert Matar, MD Taking Active   Multiple Vitamins-Minerals (MENS MULTIVITAMIN PLUS PO) [id number] No Take 1 tablet by mouth daily. [provider] Taking Active Self  naproxen (NAPROSYN) 250 MG tablet [id number] No Take 500 mg by mouth 2 (two) times daily with a meal. [provider] Taking Active Self  vitamin B-12 (CYANOCOBALAMIN) 500 MCG tablet [id number] No Take 1 tablet (500 mcg total) by mouth daily. Robert Matar, MD Taking Active             Patient Active Problem List   Diagnosis Date Noted   Hemiparesis affecting right side as late effect of cerebrovascular accident (CVA) (HCC) 09/06/2020   Personal history of fall 09/06/2020   Chronic  right shoulder pain 09/06/2020   Closed bimalleolar fracture of right ankle 07/27/2020   History of loop recorder 05/02/2020   Carotid artery disease (HCC) 05/02/2020   Right hemiparesis (HCC) 04/30/2020   Obesity (BMI 35.0-39.9 without comorbidity) 04/30/2020   Hyperlipidemia 04/26/2019   Hypokalemia    Acute embolic stroke [facility])    History of CVA (cerebrovascular accident) 02/11/2018   Acute kidney injury superimposed on chronic kidney disease (HCC) 02/11/2018   History  of syphilis 02/11/2018   Constipation 02/11/2018   Osteoarthritis 02/11/2018   Benign essential HTN 02/11/2018   Acute encephalopathy 02/10/2018    Conditions to be addressed/monitored per PCP order:   DME  There are no care plans that you recently modified to display for this patient.   Follow up:  Patient agrees to Care Plan and Follow-up.  Plan: The Managed Medicaid care management team will reach out to the patient again over the next 30 days.  Date/time of next scheduled Social Work care management/care coordination outreach:  06/04/20  Robert Donovan, Kenard Gower, [facility]  [facility]  High Risk Managed Medicaid Team  [phone]

## 2021-05-06 ENCOUNTER — Other Ambulatory Visit: Payer: Self-pay | Admitting: *Deleted

## 2021-05-06 DIAGNOSIS — G8193 Hemiplegia, unspecified affecting right nondominant side: Secondary | ICD-10-CM | POA: Diagnosis not present

## 2021-05-06 NOTE — Patient Instructions (Signed)
Visit Information ° °[name]  - as a part of your Medicaid benefit, you are eligible for care management and care coordination services at no cost or copay. I was unable to reach you by phone today but would be happy to help you with your health related needs. Please feel free to call me @ [phone].  ° °A member of the Managed Medicaid care management team will reach out to you again over the next 14 days.  ° °Shelvy Heckert RN, BSN °[place]   [facility] °RN Care Coordinator °  °

## 2021-05-06 NOTE — Patient Outreach (Signed)
Care Coordination  05/06/2021  Robert Donovan [birth year] [id number]   Medicaid Managed Care   Unsuccessful Outreach Note  05/06/2021 Name: Robert Donovan MRN: [id number] DOB: Sep 09, 1955  Referred by: [name], MD Reason for referral : High Risk Managed Medicaid (Unsuccessful RNCM follow up telephone outreach)   An unsuccessful telephone outreach was attempted today. The patient was referred to the case management team for assistance with care management and care coordination.   Follow Up Plan: A HIPAA compliant phone message was left for the patient providing contact information and requesting a return call.   Estanislado Emms RN, BSN [place]  [facility]

## 2021-05-07 DIAGNOSIS — G8193 Hemiplegia, unspecified affecting right nondominant side: Secondary | ICD-10-CM | POA: Diagnosis not present

## 2021-05-08 ENCOUNTER — Encounter: Payer: Self-pay | Admitting: Physical Therapy

## 2021-05-08 ENCOUNTER — Ambulatory Visit: Payer: Medicaid Other | Admitting: Physical Therapy

## 2021-05-08 ENCOUNTER — Other Ambulatory Visit: Payer: Self-pay

## 2021-05-08 DIAGNOSIS — R2681 Unsteadiness on feet: Secondary | ICD-10-CM | POA: Diagnosis not present

## 2021-05-08 DIAGNOSIS — G8193 Hemiplegia, unspecified affecting right nondominant side: Secondary | ICD-10-CM | POA: Diagnosis not present

## 2021-05-08 DIAGNOSIS — R269 Unspecified abnormalities of gait and mobility: Secondary | ICD-10-CM

## 2021-05-08 DIAGNOSIS — M6281 Muscle weakness (generalized): Secondary | ICD-10-CM

## 2021-05-08 NOTE — Therapy (Signed)
[facility] [address] [place], [place], [address] Phone: [phone]   Fax:  [phone]  Physical Therapy Treatment  Patient Details  Name: Robert Donovan MRN: [id number] Date of Birth: 11/16/1955 Referring Provider (PT): Karle Plumber MD   Encounter Date: 05/08/2021   PT End of Session - 05/08/21 1005     Visit Number 17    Number of Visits 25    Date for PT Re-Evaluation 05/13/21    Authorization Type [employer] MCD    Authorization Time Period 27 visit limit combined disciplines, 04/10/21-05/13/21    Authorization - Visit Number 16   updated on 12/7 due to mis numbering on 04/05/21 visit   Authorization - Number of Visits 27    Progress Note Due on Visit 10    PT Start Time 0848    PT Stop Time 0930    PT Time Calculation (min) 42 min    Equipment Utilized During Treatment Gait belt    Activity Tolerance Patient tolerated treatment well    Behavior During Therapy [facility] for tasks assessed/performed               Past Medical History:  Diagnosis Date   Arthritis    Bilateral Knees   High cholesterol    History of loop recorder 05/16/2019   Hypertension    Stroke ([place]) 02/10/2018    Past Surgical History:  Procedure Laterality Date   LOOP RECORDER IMPLANT  05/16/2019   TEE WITHOUT CARDIOVERSION N/A 02/16/2018   Procedure: TRANSESOPHAGEAL ECHOCARDIOGRAM (TEE);  Surgeon: Buford Dresser, MD;  Location: [facility] ENDOSCOPY;  Service: Cardiovascular;  Laterality: N/A;   TOOTH EXTRACTION Bilateral 10/19/2020   Procedure: DENTAL RESTORATION/EXTRACTIONS;  Surgeon: Diona Browner, DMD;  Location: [facility];  Service: Oral Surgery;  Laterality: Bilateral;    There were no vitals filed for this visit.   Subjective Assessment - 05/08/21 0849     Subjective No new complaints, falls, or pain today. R leg "has been behaving a bit better today."    Pertinent History 1. Hemiparesis affecting right side as late effect of  cerebrovascular accident (CVA) ([place])  2. Ankle fracture, bimalleolar, closed, right, sequela  I think it is reasonable for him to continue with PCS services due to residual right-sided weakness from stroke and gait disturbance associated with the stroke and his ankle fracture.     3. Chronic right shoulder pain  We will get an x-ray of the shoulder to see whether he has developed some arthritis in the shoulder.  Further management will be based on results.  - DG Shoulder Right; Future     4. History of syphilis  -Patient requesting follow-up visit with infectious disease specialist as he was told.  - Ambulatory referral to Infectious Disease    Limitations Walking;Standing    How long can you sit comfortably? >15 min    How long can you stand comfortably? <5 min    How long can you walk comfortably? n/a    Patient Stated Goals To improve my mobility    Currently in Pain? No/denies    Pain Score 0-No pain               [facility] Adult PT Treatment/Exercise - 05/08/21 0851       Transfers   Transfers Sit to Stand;Stand to Sit    Sit to Stand 5: Supervision;4: Min guard    Sit to Stand Details Verbal cues for technique    Sit to Stand  Details (indicate cue type and reason) Pt performes sit<>stands the UE support with supervision. Pt performed sit<>stands  x 5 from elevated surface with no UE support. The pt required min guard and cues for technique.    Stand to Sit 5: Supervision;4: Min guard      Ambulation/Gait   Ambulation/Gait Yes    Ambulation/Gait Assistance 5: Supervision    Ambulation/Gait Assistance Details Pt performed x 2 laps around track with a rest break in between laps. The required supervision to perform and demonstrated improved step length. The pt continued to demonstrate narrow BOS and intermittent scissoring of LEs as well as increased trunk flexion with stance phase on R.    Ambulation Distance (Feet) 230 Feet   x 2 laps   Assistive device Rolling walker    Gait Pattern  Step-through pattern;Decreased step length - right;Narrow base of support;Decreased stance time - right;Right foot flat;Poor foot clearance - right    Ambulation Surface Level;Indoor      Exercises   Exercises Ankle;Knee/Hip      Knee/Hip Exercises: Seated   Heel Slides AROM;Right;1 set;10 reps    Heel Slides Limitations Pt performed seated heel slides x 10 on R with foot on pillowcase.      Knee/Hip Exercises: Supine   Bridges Strengthening;Both;10 reps    Bridges Limitations bridges with LE's on ball x 10 with 5 sec hold    Other Supine Knee/Hip Exercises supine clamshells with red theraband x 10 focusing on controlled movements. supine B hamstring curls with LE's on red theraball    x 10.      Ankle Exercises: Stretches   Other Stretch hold/relax gastroc stretch x 5. The pt demonstrated decreased ankle ROM and decreased strength of plantarflexors.      Ankle Exercises: Supine   Other Supine Ankle Exercises resisted DF x 10 with red theraband. Resisted eversion x 10 with red band. The pt demonstrated decreased ROM with both                 PT Short Term Goals - 03/22/21 0853       PT SHORT TERM GOAL #1   Title Patient to demo initial HEP back to PT w/o need of cuing. (all STGS due 03/20/21)    Baseline 03/20/21: met with current HEP. HEP updated this session.    Status Achieved      PT SHORT TERM GOAL #2   Title Patient to perform all transfers with S    Baseline 03/20/21: met in session today    Status Achieved      PT SHORT TERM GOAL #3   Title Patient to amulate 41f in // bars with SBA    Baseline 03/15/21 Ambulation of 1157fwith CGA and RW    Status Achieved      PT SHORT TERM GOAL #4   Title Increase RLE hip extension, abduction, knee extension and DF to 3+/5    Baseline 03/20/21: met with testing today    Status Achieved      PT SHORT TERM GOAL #5   Title 03/15/21 BERG goal 40    Baseline 02/20/21: baseline score of 26/56    Status Achieved                PT Long Term Goals - 04/19/21 1041       PT LONG TERM GOAL #1   Title Patient to demo final HEP to PT w/o cuing. (all LTGs due 04/17/21)    Baseline  I with HEP    Time 4    Period Weeks    Status Achieved    Target Date 05/13/21      PT LONG TERM GOAL #2   Title Patient to increase sterngth of RLE hip extension, abduction, knee extension and DF to 4/5    Baseline 04/19/2021: Knee ext 4/5, DF 3+/5, R hip abd 3+/5, and R hip ext 2/5    Time 4    Period Weeks    Status Not Met    Target Date 05/13/21      PT LONG TERM GOAL #3   Title Perform TUG and establish goal    Baseline 03/27/21: 36.68 sec's with RW as baseline, PT to reset goal. 04/19/2021: 43.27 sec with RW scored today. no goal was set from 03/27/21 baseline score. new baseline today required more time.    Time 4    Period Weeks    Status Not Met    Target Date 05/13/21      PT LONG TERM GOAL #4   Title Patient to ambulate 221f with LRAD under S    Baseline 04/10/2021: pt ambulated 250 ft with RW under supervision/min guard    Time 4    Period Weeks    Status Partially Met    Target Date 05/13/21      PT LONG TERM GOAL #5   Title Assess progress towards BERG; 03/15/21 BERG goal is 40/56    Baseline 04/19/2021: 32/56, improved just not to goal level    Time 4    Period Weeks    Status Partially Met    Target Date 05/13/21               Plan - 05/08/21 1005     Clinical Impression Statement Today's skilled session was focused on LE strengthening/ROM exercises and continued gait training with RW. The pt continues to demonstrate weakness R LE weakness and decreased R ankle ROM. The pt performed all interventions with no issues noted and rest breaks as needed. The pt should continue to belefit from further skilled PT to address functional deficits.    Personal Factors and Comorbidities Comorbidity 2    Comorbidities CVA, obesity    Examination-Activity Limitations Stairs;Locomotion  Level;Transfers    Examination-Participation Restrictions Community Activity;Meal Prep    Stability/Clinical Decision Making Stable/Uncomplicated    Rehab Potential Good    PT Frequency 2x / week    PT Duration 8 weeks    PT Treatment/Interventions ADLs/Self Care Home Management;Aquatic Therapy;DME Instruction;Gait training;Stair training;Functional mobility training;Therapeutic activities;Therapeutic exercise;Balance training;Neuromuscular re-education;Patient/family education    PT Next Visit Plan Continue with sit<>stands w/o UE support from lower heights, static/dynamic balance in // bars, and LE strengthening/stretching, DF/PF strengthening, stepping tasks and core/sitting balance challenges (increased challenge with this). Standing/ walking endurance.    PT Home Exercise Plan [id number]    Consulted and Agree with Plan of Care Patient             Patient will benefit from skilled therapeutic intervention in order to improve the following deficits and impairments:  Decreased endurance, Obesity, Decreased activity tolerance, Decreased strength, Decreased balance, Decreased mobility, Difficulty walking, Abnormal gait, Decreased safety awareness  Visit Diagnosis: Unsteadiness on feet  Muscle weakness (generalized)  Gait disorder     Problem List Patient Active Problem List   Diagnosis Date Noted   Hemiparesis affecting right side as late effect of cerebrovascular accident (CVA) ([place] 09/06/2020   Personal history of fall 09/06/2020  Chronic right shoulder pain 09/06/2020   Closed bimalleolar fracture of right ankle 07/27/2020   History of loop recorder 05/02/2020   Carotid artery disease ([place]) 05/02/2020   Right hemiparesis ([place]) 04/30/2020   Obesity (BMI 35.0-39.9 without comorbidity) 04/30/2020   Hyperlipidemia 04/26/2019   Hypokalemia    Acute embolic stroke [facility])    History of CVA (cerebrovascular accident) 02/11/2018   Acute kidney injury superimposed on chronic  kidney disease ([place]) 02/11/2018   History of syphilis 02/11/2018   Constipation 02/11/2018   Osteoarthritis 02/11/2018   Benign essential HTN 02/11/2018   Acute encephalopathy 02/10/2018    Rondel Baton, SPTA 05/08/2021, 10:13 AM  [facility] [address] [place], [place], [address] Phone: [phone]   Fax:  [phone]  Name: Robert Donovan MRN: [id number] Date of Birth: 09/26/1955  This note has been reviewed and edited by supervising CI.   Willow Ora, PTA, [facility] [address] [place], [place] [address] [phone] 05/08/21, 10:41 AM

## 2021-05-09 DIAGNOSIS — G8193 Hemiplegia, unspecified affecting right nondominant side: Secondary | ICD-10-CM | POA: Diagnosis not present

## 2021-05-10 ENCOUNTER — Ambulatory Visit: Payer: Medicaid Other

## 2021-05-10 DIAGNOSIS — R2681 Unsteadiness on feet: Secondary | ICD-10-CM

## 2021-05-10 DIAGNOSIS — M6281 Muscle weakness (generalized): Secondary | ICD-10-CM

## 2021-05-10 NOTE — Therapy (Signed)
[facility] [address] [place], [place], [address] Phone: [phone]   Fax:  [phone]  Physical Therapy Treatment  Patient Details  Name: Robert Donovan MRN: [id number] Date of Birth: 1955/08/01 Referring Provider (PT): Karle Plumber MD   Encounter Date: 05/10/2021   PT End of Session - 05/10/21 0848     Visit Number 18    Number of Visits 25    Date for PT Re-Evaluation 05/13/21    Authorization Type [employer] MCD    Authorization Time Period 27 visit limit combined disciplines, 04/10/21-05/13/21    Authorization - Visit Number 17   updated on 12/7 due to mis numbering on 04/05/21 visit   Authorization - Number of Visits 27    Progress Note Due on Visit 10    PT Start Time 0846    PT Stop Time 0929    PT Time Calculation (min) 43 min    Equipment Utilized During Treatment Gait belt    Activity Tolerance Patient tolerated treatment well    Behavior During Therapy [facility] for tasks assessed/performed             Past Medical History:  Diagnosis Date   Arthritis    Bilateral Knees   High cholesterol    History of loop recorder 05/16/2019   Hypertension    Stroke ([place]) 02/10/2018    Past Surgical History:  Procedure Laterality Date   LOOP RECORDER IMPLANT  05/16/2019   TEE WITHOUT CARDIOVERSION N/A 02/16/2018   Procedure: TRANSESOPHAGEAL ECHOCARDIOGRAM (TEE);  Surgeon: Buford Dresser, MD;  Location: [facility] ENDOSCOPY;  Service: Cardiovascular;  Laterality: N/A;   TOOTH EXTRACTION Bilateral 10/19/2020   Procedure: DENTAL RESTORATION/EXTRACTIONS;  Surgeon: Diona Browner, DMD;  Location: [facility];  Service: Oral Surgery;  Laterality: Bilateral;    There were no vitals filed for this visit.   Subjective Assessment - 05/10/21 0848     Subjective No new changes/complaints. No falls. No pain to report.    Pertinent History 1. Hemiparesis affecting right side as late effect of cerebrovascular accident (CVA) ([place])   2. Ankle fracture, bimalleolar, closed, right, sequela  I think it is reasonable for him to continue with PCS services due to residual right-sided weakness from stroke and gait disturbance associated with the stroke and his ankle fracture.     3. Chronic right shoulder pain  We will get an x-ray of the shoulder to see whether he has developed some arthritis in the shoulder.  Further management will be based on results.  - DG Shoulder Right; Future     4. History of syphilis  -Patient requesting follow-up visit with infectious disease specialist as he was told.  - Ambulatory referral to Infectious Disease    Limitations Walking;Standing    How long can you sit comfortably? >15 min    How long can you stand comfortably? <5 min    How long can you walk comfortably? n/a    Patient Stated Goals To improve my mobility    Currently in Pain? No/denies              [facility] Adult PT Treatment/Exercise - 05/10/21 0001       Transfers   Transfers Sit to Stand;Stand to Sit    Sit to Stand 5: Supervision;4: Min guard    Stand to Sit 5: Supervision;4: Min guard    Comments completed sit <> stand training from mat with BUE on legs completed standing and maintaining balance upon upright position without  RW, completed 2 x5 reps with CGA from PT.      Ambulation/Gait   Ambulation/Gait Yes    Ambulation/Gait Assistance 5: Supervision    Ambulation/Gait Assistance Details Continued ambulation with RW working on improved endurance/gait distance, patient continue to only tolerate x 230 ft prior to needing rest break at this time due to fatigue. Then additional 115 ft noted, with fatigue shortened step lengths requiring cues from PT with patient able to demo improvements. Cues to stand tall with stance time on RLE.    Ambulation Distance (Feet) 230 Feet   x1, 115 x 1   Assistive device Rolling walker    Gait Pattern Step-through pattern;Decreased step length - right;Narrow base of support;Decreased stance time -  right;Right foot flat;Poor foot clearance - right    Ambulation Surface Level;Indoor      Exercises   Exercises Knee/Hip      Knee/Hip Exercises: Supine   Short Arc Quad Sets Strengthening;Right;1 set;10 reps;Limitations    Short Arc Quad Sets Limitations with RLE on bolster completed x 10 reps, cues for technique.    Bridges Strengthening;Both;10 reps;2 [employer] Limitations completed bridges x 10 reps, then second set with ball squeeze and bridges x 10 reps, cues for technique.    Straight Leg Raises Strengthening;Both;1 set;10 reps;Limitations    Straight Leg Raises Limitations extension lag noted due to quad weakness on RLE, requiring assistance from PT to maintain extension               PT Short Term Goals - 03/22/21 0853       PT SHORT TERM GOAL #1   Title Patient to demo initial HEP back to PT w/o need of cuing. (all STGS due 03/20/21)    Baseline 03/20/21: met with current HEP. HEP updated this session.    Status Achieved      PT SHORT TERM GOAL #2   Title Patient to perform all transfers with S    Baseline 03/20/21: met in session today    Status Achieved      PT SHORT TERM GOAL #3   Title Patient to amulate 34f in // bars with SBA    Baseline 03/15/21 Ambulation of 1149fwith CGA and RW    Status Achieved      PT SHORT TERM GOAL #4   Title Increase RLE hip extension, abduction, knee extension and DF to 3+/5    Baseline 03/20/21: met with testing today    Status Achieved      PT SHORT TERM GOAL #5   Title 03/15/21 BERG goal 40    Baseline 02/20/21: baseline score of 26/56    Status Achieved               PT Long Term Goals - 04/19/21 1041       PT LONG TERM GOAL #1   Title Patient to demo final HEP to PT w/o cuing. (all LTGs due 04/17/21)    Baseline I with HEP    Time 4    Period Weeks    Status Achieved    Target Date 05/13/21      PT LONG TERM GOAL #2   Title Patient to increase sterngth of RLE hip extension, abduction, knee  extension and DF to 4/5    Baseline 04/19/2021: Knee ext 4/5, DF 3+/5, R hip abd 3+/5, and R hip ext 2/5    Time 4    Period Weeks    Status Not Met  Target Date 05/13/21      PT LONG TERM GOAL #3   Title Perform TUG and establish goal    Baseline 03/27/21: 36.68 sec's with RW as baseline, PT to reset goal. 04/19/2021: 43.27 sec with RW scored today. no goal was set from 03/27/21 baseline score. new baseline today required more time.    Time 4    Period Weeks    Status Not Met    Target Date 05/13/21      PT LONG TERM GOAL #4   Title Patient to ambulate 269f with LRAD under S    Baseline 04/10/2021: pt ambulated 250 ft with RW under supervision/min guard    Time 4    Period Weeks    Status Partially Met    Target Date 05/13/21      PT LONG TERM GOAL #5   Title Assess progress towards BERG; 03/15/21 BERG goal is 40/56    Baseline 04/19/2021: 32/56, improved just not to goal level    Time 4    Period Weeks    Status Partially Met    Target Date 05/13/21                   Plan - 05/10/21 1019     Clinical Impression Statement Continued focused on gait training with RW, working on improved ambulaiton distance. Patient continue to demo fatigue w/ increased distances. Rest of session focused on continued RLE strengthening and sit <> stands. Patient require intermittent breaks due to fatigue. Will continue per POC.    Personal Factors and Comorbidities Comorbidity 2    Comorbidities CVA, obesity    Examination-Activity Limitations Stairs;Locomotion Level;Transfers    Examination-Participation Restrictions Community Activity;Meal Prep    Stability/Clinical Decision Making Stable/Uncomplicated    Rehab Potential Good    PT Frequency 2x / week    PT Duration 8 weeks    PT Treatment/Interventions ADLs/Self Care Home Management;Aquatic Therapy;DME Instruction;Gait training;Stair training;Functional mobility training;Therapeutic activities;Therapeutic exercise;Balance  training;Neuromuscular re-education;Patient/family education    PT Next Visit Plan Will need re-cert next week. Continue with sit<>stands w/o UE support from lower heights, static/dynamic balance in // bars, and LE strengthening/stretching, DF/PF strengthening, stepping tasks and core/sitting balance challenges (increased challenge with this). Standing/ walking endurance.    PT Home Exercise Plan [id number]    Consulted and Agree with Plan of Care Patient             Patient will benefit from skilled therapeutic intervention in order to improve the following deficits and impairments:  Decreased endurance, Obesity, Decreased activity tolerance, Decreased strength, Decreased balance, Decreased mobility, Difficulty walking, Abnormal gait, Decreased safety awareness  Visit Diagnosis: Unsteadiness on feet  Muscle weakness (generalized)     Problem List Patient Active Problem List   Diagnosis Date Noted   Hemiparesis affecting right side as late effect of cerebrovascular accident (CVA) ([place] 09/06/2020   Personal history of fall 09/06/2020   Chronic right shoulder pain 09/06/2020   Closed bimalleolar fracture of right ankle 07/27/2020   History of loop recorder 05/02/2020   Car[place]id artery disease ([facility] 05/02/2020   Right hemiparesis ([facility] 04/30/2020   Obesity (BMI 35.0-39.9 without comorbidity) 04/30/2020   Hyperlipidemia 04/26/2019   Hypokalemia    Acute embolic stroke ([facility]    History of CVA (cerebrovascular accident) 02/11/2018   Acute kidney injury superimposed on chronic kidney disease ([facility] 02/11/2018   History of syphilis 02/11/2018   Constipation 02/11/2018   Osteoarthritis 02/11/2018   Benign essential HTN 02/11/2018  Acute encephalopathy 02/10/2018    Jones Bales, PT, DPT 05/10/2021, 10:22 AM  [facility] [address] [place], [place], [address] Phone: [phone]   Fax:  [phone]  Name: Robert Donovan MRN: [id number] Date of Birth: 02-27-1956

## 2021-05-13 DIAGNOSIS — G8193 Hemiplegia, unspecified affecting right nondominant side: Secondary | ICD-10-CM | POA: Diagnosis not present

## 2021-05-14 ENCOUNTER — Other Ambulatory Visit: Payer: Self-pay | Admitting: *Deleted

## 2021-05-14 ENCOUNTER — Other Ambulatory Visit: Payer: Medicaid Other | Admitting: *Deleted

## 2021-05-14 ENCOUNTER — Ambulatory Visit: Payer: Medicaid Other

## 2021-05-14 ENCOUNTER — Other Ambulatory Visit: Payer: Self-pay

## 2021-05-14 DIAGNOSIS — G8193 Hemiplegia, unspecified affecting right nondominant side: Secondary | ICD-10-CM | POA: Diagnosis not present

## 2021-05-14 NOTE — Patient Outreach (Signed)
Medicaid Managed Care   Nurse Care Manager Note  05/14/2021 Name:  Robert Donovan MRN:  [id number] DOB:  Dec 26, 1955  Robert Donovan is an 65 y.o. year old male who is a primary patient of Robert Matar, MD.  The Medicaid Managed Care Coordination team was consulted for assistance with:    CAD  Mr. Maggart was given information about Medicaid Managed Care Coordination team services today. Shooter Robert Donovan Parent and Administrator, Civil Service (DPR) agreed to services and verbal consent obtained.  Engaged with patient by telephone for follow up visit in response to provider referral for case management and/or care coordination services.   Assessments/Interventions:  Review of past medical history, allergies, medications, health status, including review of consultants reports, laboratory and other test data, was performed as part of comprehensive evaluation and provision of chronic care management services.  SDOH (Social Determinants of Health) assessments and interventions performed: SDOH Interventions    Flowsheet Row Most Recent Value  SDOH Interventions   Transportation Interventions Intervention Not Indicated       Care Plan  Allergies  Allergen Reactions   Hydrocodone     Pt stated, "I am not allergic, I do not want to take this medicine ever" Codeine - "I do not want to ever take this medicine - not allergic"   Oxycodone     Pt stated, "I am not allergic, I do not want to take this medicine ever"    Medications Reviewed Today     Reviewed by [name], RN (Registered Nurse) on 05/14/21 at 1520  Med List Status: <None>   Medication Order Taking? Sig Documenting Provider Last Dose Status Informant  amLODipine (NORVASC) 5 MG tablet [id number] Yes Take 1 tablet (5 mg total) by mouth daily. Robert Matar, MD Taking Active   aspirin EC 81 MG tablet [id number] Yes Take 1 tablet (81 mg total) by mouth daily. Robert Matar, MD Taking Active   atorvastatin (LIPITOR) 40  MG tablet [id number] Yes Take 1 tablet (40 mg total) by mouth every evening. Robert Matar, MD Taking Active   calcium-vitamin D Ruthell Donovan WITH D) 500-200 MG-UNIT tablet [id number] Yes Take 1 tablet by mouth daily. [provider] Taking Active Self  lisinopril-hydrochlorothiazide (ZESTORETIC) 20-25 MG tablet [id number] Yes TAKE 2 TABLETS BY MOUTH DAILY FOR BLOOD PRESSURE Robert Matar, MD Taking Active   Misc. Devices MISC [id number]  Large blood pressure cuff device For home blood pressure monitoring Robert Matar, MD  Active   Multiple Vitamins-Minerals (MENS MULTIVITAMIN PLUS PO) [id number] Yes Take 1 tablet by mouth daily. [provider] Taking Active Self  naproxen (NAPROSYN) 250 MG tablet [id number] Yes Take 500 mg by mouth 2 (two) times daily with a meal. [provider] Taking Active Self  vitamin B-12 (CYANOCOBALAMIN) 500 MCG tablet [id number] Yes Take 1 tablet (500 mcg total) by mouth daily. Robert Matar, MD Taking Active             Patient Active Problem List   Diagnosis Date Noted   Hemiparesis affecting right side as late effect of cerebrovascular accident (CVA) ([facility]) 09/06/2020   Personal history of fall 09/06/2020   Chronic right shoulder pain 09/06/2020   Closed bimalleolar fracture of right ankle 07/27/2020   History of loop recorder 05/02/2020   Carotid artery disease (H[facility]) 05/02/2020   Right hemiparesis (H[facility]) 04/30/2020   Obesity (BMI 35.0-39.9 without comorbidity) 04/30/2020   Hyperlipidemia 04/26/2019   Hypokalemia    Acute embolic stroke (  H[facility])    History of CVA (cerebrovascular accident) 02/11/2018   Acute kidney injury superimposed on chronic kidney disease (HCC) 02/11/2018   History of syphilis 02/11/2018   Constipation 02/11/2018   Osteoarthritis 02/11/2018   Benign essential HTN 02/11/2018   Acute encephalopathy 02/10/2018    Conditions to be addressed/monitored per PCP order:  CAD  Care Plan : General Plan of  Care (Adult)  Updates made by [name], RN since 05/14/2021 12:00 AM  Completed 05/14/2021   Problem: Health Promotion or Disease Self-Management (General Plan of Care) Resolved 05/14/2021  Note:   Resolving due to duplicate goal     Long-Range Goal: Self-Management Plan Developed Completed 05/14/2021  Start Date: 10/22/2020  Expected End Date: 05/06/2021  Recent Progress: On track  Priority: High  Note:   Current Barriers:  Ineffective Self Health Maintenance-R Unable to self administer medications as prescribed Unable to perform ADLs independently Does not contact provider office for questions/concerns Currently UNABLE TO independently self manage needs related to chronic health conditions.  Knowledge Deficits related to short term plan for care coordination needs and long term plans for chronic disease management needs Resolving due to duplicate goal  Nurse Case Manager Clinical Goal(s):  patient will work with care management team to address care coordination and chronic disease management needs related to Disease Management   Interventions:  Evaluation of current treatment plan related to HTN and mobility and patient's adherence to plan as established by provider. Advised patient/DPR to contact PCP provider with any questions and concerns Discussed plans with patient/DPR for ongoing care management follow up and provided patient with direct contact information for care management team Advised patient/DPR, providing education and rationale, to monitor blood pressure daily and record, calling PCP for findings outside established parameters.  Provided patient/ and/or caregiver with contact information for [facility] [phone], advised to call and schedule an appointment, referral placed by Dr. Laural Benes Reviewed scheduled/upcoming provider appointments  Provided education on preventive health Provided patient/DPR with medical transportation provided by [facility]  [phone], call at least 2-3 days prior to appointment Discussed benefits provided by [facility], encouraged [name] to call [phone] for more information  Self Care Activities:  Patient will self administer medications as prescribed Patient will attend all scheduled provider appointments Patient will attend church or other social activities Patient will call provider office for new concerns or questions Patient will work with BSW to address care coordination needs and will continue to work with the clinical team to address health care and disease management related needs.   Patient will work with MM Pharmacist, Harrold Donath for medication management Patient Goals: - arrange medical transportation provided by [facility] [phone] at least 2-3 days prior to appointment - Contact [employer] [phone] for Wellness Benefits - begin a notebook of services in my neighborhood or community - follow-up on any referrals for help I am given - call [facility] [phone] to schedule an eye appointment(referral placed by Dr. Laural Benes) - call Outpatient Rehab [phone] to schedule PT appointment(referral placed by Dr. Laural Benes) - attend all scheduled appointments, call to schedule follow up with PCP-03/13/21 @ [facility] - arrange a ride through an agency 1 week before appointment - call to cancel if needed - keep a calendar with prescription refill dates - keep a calendar with appointment dates  - Contact provider office with any questions or concerns Follow Up Plan: Telephone follow up appointment with care management team member scheduled for:05/06/21 @ 10:30am     Care Plan :  RN Care Manager Plan of Care  Updates made by [name], RN since 05/14/2021 12:00 AM     Problem: Chronic Heatlh Management Needs Related to History of CVA      Long-Range Goal: Development of Plan of Care to address heatlh management needs related to history of CVA   Start Date: 05/14/2021  Expected End Date: 07/13/2021   Priority: High  Note:   Current Barriers:  Chronic Disease Management support and education needs related to CAD-[name]/DPR, Julieanne Cotton is concerned re: denial for electric scooter. She expresses that patient is currently using a borrowed scooter that is too small. She feels that if patient had his own scooter, this would be beneficial for his independence and mobility.   RNCM Clinical Goal(s):  Patient will verbalize understanding of plan for management of CAD as evidenced by verbalization and self monitoring activities take all medications exactly as prescribed and will call provider for medication related questions as evidenced by documentation in EMR    attend all scheduled medical appointments: Neuro PT, [facility] on 12/28 and PCP on 07/15/21 as evidenced by provider documentation in EMR        continue to work with RN Care Manager and/or Social Worker to address care management and care coordination needs related to CAD as evidenced by adherence to CM Team Scheduled appointments     through collaboration with Medical illustrator, provider, and care team.   Interventions: Inter-disciplinary care team collaboration (see longitudinal plan of care) Evaluation of current treatment plan related to  self management and patient's adherence to plan as established by provider Provided information for Medicaid Enrollment Broker [phone] and Medicaid Ombudsman [phone] Advised to discuss questions or concerns with PCP   CAD  (Status: New goal.) Long Term Goal  Assessed understanding of CAD diagnosis Medications reviewed including medications utilized in CAD treatment plan Provided education on importance of blood pressure control in management of CAD; Provided education on Importance of limiting foods high in cholesterol; Counseled on importance of regular laboratory monitoring as prescribed; Reviewed Importance of taking all medications as prescribed Reviewed Importance  of attending all scheduled provider appointments Advised to report any changes in symptoms or exercise tolerance Assessed social determinant of health barriers;   Patient Goals/Self-Care Activities: Take medications as prescribed   Attend all scheduled provider appointments Call pharmacy for medication refills 3-7 days in advance of running out of medications Attend church or other social activities Call provider office for new concerns or questions        Follow Up:  Patient agrees to Care Plan and Follow-up.  Plan: The Managed Medicaid care management team will reach out to the patient again over the next 30 days.  Date/time of next scheduled RN care management/care coordination outreach:  06/13/21 @ 3:15pm  Estanislado Emms RN, BSN [place]   [facility]

## 2021-05-14 NOTE — Patient Instructions (Signed)
Visit Information  Robert Donovan was given information about Medicaid Managed Care team care coordination services as a part of their [facility] Community Plan Medicaid benefit. Robert Donovan verbally consented to engagement with the [facility] Managed Care team.   If you are experiencing a medical emergency, please call 911 or report to your local emergency department or urgent care.   If you have a non-emergency medical problem during routine business hours, please contact your provider's office and ask to speak with a nurse.   For questions related to your [facility], please call: [phone] or visit the homepage here: [email or web address]  If you would like to schedule transportation through your [facility], please call the following number at least 2 days in advance of your appointment: [phone].   Call the Behavioral Health Crisis Line at [phone], at any time, 24 hours a day, 7 days a week. If you are in danger or need immediate medical attention call 911.  If you would like help to quit smoking, call 1-800-QUIT-NOW (986-009-7572) OR Espaol: 1-855-Djelo-Ya (4-132-440-1027) o para ms informacin haga clic aqu or Text READY to 253-664 to register via text  Robert Donovan - following are the goals we discussed in your visit today:   Goals Addressed             This Visit's Progress    COMPLETED: Find Help in My Community       Resolving due to duplicate goal  Timeframe:  Long-Range Goal Priority:  High Start Date:   10/22/20                          Expected End Date:  05/06/21                    Follow Up Date 05/06/21   - arrange medical transportation provided by [facility] [phone] at least 2-3 days prior to appointment - Contact UHC [phone] for Wellness Benefits - begin a notebook of services in my neighborhood or community - follow-up on any referrals  for help I am given    Why is this important?   Knowing how and where to find help for yourself or family in your neighborhood and community is an important skill.  You will want to take some steps to learn how.         COMPLETED: Make and Keep All Appointments       Resolving due to duplicate goal  Timeframe:  Long-Range Goal Priority:  High Start Date:   10/22/20                          Expected End Date: 05/06/21                      Follow Up Date 05/06/21  - call [facility] [phone] to schedule an eye appointment(referral placed by Dr. Laural Benes) - call Outpatient Rehab [phone] to schedule PT appointment(referral placed by Dr. Laural Benes) - attend all scheduled appointments, call to schedule follow up with PCP-03/13/21 @ [facility] - arrange a ride through an agency 1 week before appointment - call to cancel if needed - keep a calendar with prescription refill dates - keep a calendar with appointment dates  - Contact provider office with any questions or concerns   Why is this important?   Part of staying healthy is seeing the  doctor for follow-up care.  If you forget your appointments, there are some things you can do to stay on track.            Please see education materials related to diet and life changes provided by MyChart link.  Patient/DPR has access to MyChart and can view provided education  Telephone follow up appointment with Managed Medicaid care management team member scheduled for:06/13/21 @ 3:15pm  Robert Emms RN, BSN [place]   [facility] RN Care Coordinator   Following is a copy of your plan of care:  Care Plan : General Plan of Care (Adult)  Updates made by Robert Dach, RN since 05/14/2021 12:00 AM  Completed 05/14/2021   Problem: Health Promotion or Disease Self-Management (General Plan of Care) Resolved 05/14/2021  Note:   Resolving due to duplicate goal     Long-Range Goal: Self-Management Plan Developed  Completed 05/14/2021  Start Date: 10/22/2020  Expected End Date: 05/06/2021  Recent Progress: On track  Priority: High  Note:   Current Barriers:  Ineffective Self Health Maintenance-R Unable to self administer medications as prescribed Unable to perform ADLs independently Does not contact provider office for questions/concerns Currently UNABLE TO independently self manage needs related to chronic health conditions.  Knowledge Deficits related to short term plan for care coordination needs and long term plans for chronic disease management needs Resolving due to duplicate goal  Nurse Case Manager Clinical Goal(s):  patient will work with care management team to address care coordination and chronic disease management needs related to Disease Management   Interventions:  Evaluation of current treatment plan related to HTN and mobility and patient's adherence to plan as established by provider. Advised patient/DPR to contact PCP provider with any questions and concerns Discussed plans with patient/DPR for ongoing care management follow up and provided patient with direct contact information for care management team Advised patient/DPR, providing education and rationale, to monitor blood pressure daily and record, calling PCP for findings outside established parameters.  Provided patient/ and/or caregiver with contact information for [facility] [phone], advised to call and schedule an appointment, referral placed by Dr. Laural Benes Reviewed scheduled/upcoming provider appointments  Provided education on preventive health Provided patient/DPR with medical transportation provided by [facility] [phone], call at least 2-3 days prior to appointment Discussed benefits provided by [facility], encouraged Robert Donovan to call [phone] for more information  Self Care Activities:  Patient will self administer medications as prescribed Patient will attend all scheduled provider appointments Patient  will attend church or other social activities Patient will call provider office for new concerns or questions Patient will work with BSW to address care coordination needs and will continue to work with the clinical team to address health care and disease management related needs.   Patient will work with MM Pharmacist, Robert Donovan for medication management Patient Goals: - arrange medical transportation provided by [facility] [phone] at least 2-3 days prior to appointment - Contact [employer] [phone] for Wellness Benefits - begin a notebook of services in my neighborhood or community - follow-up on any referrals for help I am given - call [facility] [phone] to schedule an eye appointment(referral placed by Dr. Laural Benes) - call Outpatient Rehab [phone] to schedule PT appointment(referral placed by Dr. Laural Benes) - attend all scheduled appointments, call to schedule follow up with PCP-03/13/21 @ [facility] - arrange a ride through an agency 1 week before appointment - call to cancel if needed - keep a calendar with prescription refill dates -  keep a calendar with appointment dates  - Contact provider office with any questions or concerns Follow Up Plan: Telephone follow up appointment with care management team member scheduled for:05/06/21 @ 10:30am     Care Plan : RN Care Manager Plan of Care  Updates made by Robert Dach, RN since 05/14/2021 12:00 AM     Problem: Chronic Heatlh Management Needs Related to History of CVA      Long-Range Goal: Development of Plan of Care to address heatlh management needs related to history of CVA   Start Date: 05/14/2021  Expected End Date: 07/13/2021  Priority: High  Note:   Current Barriers:  Chronic Disease Management support and education needs related to CAD-[name]/DPR, Robert Donovan is concerned re: denial for electric scooter. She expresses that patient is currently using a borrowed scooter that is too small. She feels that if patient  had his own scooter, this would be beneficial for his independence and mobility.   RNCM Clinical Goal(s):  Patient will verbalize understanding of plan for management of CAD as evidenced by verbalization and self monitoring activities take all medications exactly as prescribed and will call provider for medication related questions as evidenced by documentation in EMR    attend all scheduled medical appointments: Neuro PT, [facility] on 12/28 and PCP on 07/15/21 as evidenced by provider documentation in EMR        continue to work with RN Care Manager and/or Social Worker to address care management and care coordination needs related to CAD as evidenced by adherence to CM Team Scheduled appointments     through collaboration with Medical illustrator, provider, and care team.   Interventions: Inter-disciplinary care team collaboration (see longitudinal plan of care) Evaluation of current treatment plan related to  self management and patient's adherence to plan as established by provider Provided information for Medicaid Enrollment Broker [phone] and Medicaid Ombudsman [phone] Advised to discuss questions or concerns with PCP   CAD  (Status: New goal.) Long Term Goal  Assessed understanding of CAD diagnosis Medications reviewed including medications utilized in CAD treatment plan Provided education on importance of blood pressure control in management of CAD; Provided education on Importance of limiting foods high in cholesterol; Counseled on importance of regular laboratory monitoring as prescribed; Reviewed Importance of taking all medications as prescribed Reviewed Importance of attending all scheduled provider appointments Advised to report any changes in symptoms or exercise tolerance Assessed social determinant of health barriers;   Patient Goals/Self-Care Activities: Take medications as prescribed   Attend all scheduled provider appointments Call pharmacy for  medication refills 3-7 days in advance of running out of medications Attend church or other social activities Call provider office for new concerns or questions

## 2021-05-14 NOTE — Patient Instructions (Signed)
Visit Information  [name]  - as a part of your Medicaid benefit, you are eligible for care management and care coordination services at no cost or copay. I was unable to reach you by phone today but would be happy to help you with your health related needs. Please feel free to call me @ [phone].   A member of the Managed Medicaid care management team will reach out to you again over the next 14 days.   Estanislado Emms RN, BSN [place]   [facility]

## 2021-05-14 NOTE — Patient Outreach (Signed)
Care Coordination  05/14/2021  Robert Donovan [birth year] [id number]   Medicaid Managed Care   Unsuccessful Outreach Note  05/14/2021 Name: Robert Donovan MRN: [id number] DOB: [birth year]  Referred by: [name], MD Reason for referral : High Risk Managed Medicaid (Unsuccessful RNCM follow up outreach)   A second unsuccessful telephone outreach was attempted today. The patient was referred to the case management team for assistance with care management and care coordination.   Follow Up Plan: A HIPAA compliant phone message was left for the patient providing contact information and requesting a return call.   Estanislado Emms RN, BSN [place]   [facility]

## 2021-05-15 ENCOUNTER — Ambulatory Visit: Payer: Medicaid Other

## 2021-05-15 ENCOUNTER — Other Ambulatory Visit: Payer: Self-pay

## 2021-05-15 DIAGNOSIS — M6281 Muscle weakness (generalized): Secondary | ICD-10-CM

## 2021-05-15 DIAGNOSIS — R2681 Unsteadiness on feet: Secondary | ICD-10-CM | POA: Diagnosis not present

## 2021-05-15 DIAGNOSIS — R2689 Other abnormalities of gait and mobility: Secondary | ICD-10-CM

## 2021-05-15 DIAGNOSIS — R262 Difficulty in walking, not elsewhere classified: Secondary | ICD-10-CM

## 2021-05-15 DIAGNOSIS — G8193 Hemiplegia, unspecified affecting right nondominant side: Secondary | ICD-10-CM | POA: Diagnosis not present

## 2021-05-15 NOTE — Therapy (Addendum)
[facility] [address] [place], [place], [address] Phone: [phone]   Fax:  [phone]  Physical Therapy Treatment/Progress Note/Re-Certification  Patient Details  Name: Robert Donovan MRN: [id number] Date of Birth: [birth year] Referring Provider (PT): Karle Plumber MD  Physical Therapy Progress Note   Dates of Reporting Period: 03/29/21 - 05/15/21  See Note below for Objective Data and Assessment of Progress/Goals.  Thank you for the referral of this patient. Guillermina [place], PT, DPT   Encounter Date: 05/15/2021   PT End of Session - 05/15/21 0808     Visit Number 19    Number of Visits 29    Date for PT Re-Evaluation 06/21/21    Authorization Type UHC MCD    Authorization Time Period 27 visit limit combined disciplines    Authorization - Visit Number 18    Authorization - Number of Visits 27    Progress Note Due on Visit 29    PT Start Time 0805    PT Stop Time 0850    PT Time Calculation (min) 45 min    Equipment Utilized During Treatment Gait belt    Activity Tolerance Patient tolerated treatment well    Behavior During Therapy WFL for tasks assessed/performed             Past Medical History:  Diagnosis Date   Arthritis    Bilateral Knees   High cholesterol    History of loop recorder 05/16/2019   Hypertension    Stroke ([place]) 02/10/2018    Past Surgical History:  Procedure Laterality Date   LOOP RECORDER IMPLANT  05/16/2019   TEE WITHOUT CARDIOVERSION N/A 02/16/2018   Procedure: TRANSESOPHAGEAL ECHOCARDIOGRAM (TEE);  Surgeon: Buford Dresser, MD;  Location: [facility] ENDOSCOPY;  Service: Cardiovascular;  Laterality: N/A;   TOOTH EXTRACTION Bilateral 10/19/2020   Procedure: DENTAL RESTORATION/EXTRACTIONS;  Surgeon: Diona Browner, DMD;  Location: [facility];  Service: Oral Surgery;  Laterality: Bilateral;    There were no vitals filed for this visit.   Subjective Assessment - 05/15/21 0807      Subjective Pt reports he was sore after last session, but went away quickly. "Got a good workout in". No falls to report.    Pertinent History 1. Hemiparesis affecting right side as late effect of cerebrovascular accident (CVA) ([place])  2. Ankle fracture, bimalleolar, closed, right, sequela  I think it is reasonable for him to continue with PCS services due to residual right-sided weakness from stroke and gait disturbance associated with the stroke and his ankle fracture.     3. Chronic right shoulder pain  We will get an x-ray of the shoulder to see whether he has developed some arthritis in the shoulder.  Further management will be based on results.  - DG Shoulder Right; Future     4. History of syphilis  -Patient requesting follow-up visit with infectious disease specialist as he was told.  - Ambulatory referral to Infectious Disease    Limitations Walking;Standing    How long can you sit comfortably? >15 min    How long can you stand comfortably? <5 min    How long can you walk comfortably? n/a    Patient Stated Goals To improve my mobility    Currently in Pain? No/denies                [facility] PT Assessment - 05/15/21 0001       Assessment   Medical Diagnosis CVA    Referring Provider (PT) Neoma Laming  Johnson MD      Transfers   Transfers Sit to Stand;Stand to Sit    Sit to Stand 5: Supervision;4: Min guard    Stand to Sit 5: Supervision;4: Min guard      Ambulation/Gait   Ambulation/Gait Yes    Assistive device Rolling walker    Gait Pattern Step-through pattern;Decreased step length - right;Narrow base of support;Decreased stance time - right;Right foot flat;Poor foot clearance - right    Ambulation Surface Level;Indoor      Standardized Balance Assessment   Standardized Balance Assessment Berg Balance Test;Timed Up and Go Test      Berg Balance Test   Sit to Stand Able to stand  independently using hands    Standing Unsupported Able to stand safely 2 minutes    Sitting  with Back Unsupported but Feet Supported on Floor or Stool Able to sit safely and securely 2 minutes    Stand to Sit Controls descent by using hands    Transfers Able to transfer safely, definite need of hands    Standing Unsupported with Eyes Closed Able to stand 10 seconds with supervision    Standing Unsupported with Feet Together Able to place feet together independently but unable to hold for 30 seconds    From Standing, Reach Forward with Outstretched Arm Can reach forward >12 cm safely (5")   5"   From Standing Position, Pick up Object from Floor Able to pick up shoe, needs supervision    From Standing Position, Turn to Look Behind Over each Shoulder Looks behind one side only/other side shows less weight shift    Turn 360 Degrees Able to turn 360 degrees safely but slowly    Standing Unsupported, Alternately Place Feet on Step/Stool Able to complete >2 steps/needs minimal assist    Standing Unsupported, One Foot in Front Able to take small step independently and hold 30 seconds    Standing on One Leg Tries to lift leg/unable to hold 3 seconds but remains standing independently    Total Score 37      Timed Up and Go Test   TUG Normal TUG    Normal TUG (seconds) 34.78   with use of RW            [facility] Adult PT Treatment/Exercise - 05/15/21 0001       Ambulation/Gait   Ambulation/Gait Assistance 5: Supervision;4: Min guard    Ambulation/Gait Assistance Details completed ambulation with RW x 345 ft with supervision/CGA without rest breaks, continue to require intermittent cues for step length.    Ambulation Distance (Feet) 345 Feet               PT Education - 05/15/21 0910     Education Details progress toward LTGs; updated POC    Person(s) Educated Patient    Methods Explanation    Comprehension Verbalized understanding              PT Short Term Goals - 03/22/21 0853       PT SHORT TERM GOAL #1   Title Patient to demo initial HEP back to PT w/o need of  cuing. (all STGS due 03/20/21)    Baseline 03/20/21: met with current HEP. HEP updated this session.    Status Achieved      PT SHORT TERM GOAL #2   Title Patient to perform all transfers with S    Baseline 03/20/21: met in session today    Status Achieved  PT SHORT TERM GOAL #3   Title Patient to amulate 71f in // bars with SBA    Baseline 03/15/21 Ambulation of 1120fwith CGA and RW    Status Achieved      PT SHORT TERM GOAL #4   Title Increase RLE hip extension, abduction, knee extension and DF to 3+/5    Baseline 03/20/21: met with testing today    Status Achieved      PT SHORT TERM GOAL #5   Title 03/15/21 BERG goal 40    Baseline 02/20/21: baseline score of 26/56    Status Achieved               PT Long Term Goals - 05/15/21 0820       PT LONG TERM GOAL #1   Title Patient to demo final HEP to PT w/o cuing. (all LTGs due 04/17/21)    Baseline I with HEP; reports indpendence, not completing consistently    Time 4    Period Weeks    Status Not Met    Target Date 05/13/21      PT LONG TERM GOAL #2   Title Patient to increase sterngth of RLE hip extension, abduction, knee extension and DF to 4/5    Baseline 04/19/2021: Knee ext 4/5, DF 3+/5, R hip abd 3+/5, and R hip ext 2/5    Time 4    Period Weeks    Status Not Met    Target Date 05/13/21      PT LONG TERM GOAL #3   Title Perform TUG and establish goal    Baseline 03/27/21: 36.68 sec's with RW as baseline, PT to reset goal. 04/19/2021: 43.27 sec with RW scored today. no goal was set from 03/27/21 baseline score. new baseline today required more time.; 12/14: 34.78 secs    Time 4    Period Weeks    Status On-going    Target Date 05/13/21      PT LONG TERM GOAL #4   Title Patient to ambulate 23042fith LRAD under S    Baseline 04/10/2021: pt ambulated 250 ft with RW under supervision/min guard; 345 ft with supervision/CGA    Time 4    Period Weeks    Status Partially Met    Target Date 05/13/21       PT LONG TERM GOAL #5   Title Assess progress towards BERG; 03/15/21 BERG goal is 40/56    Baseline 04/19/2021: 32/56; 12/14: 37/56    Time 4    Period Weeks    Status Partially Met    Target Date 05/13/21            Updated Short Term Goals:   PT Short Term Goals - 05/15/21 0923       PT SHORT TERM GOAL #1   Title = LTGs              Updated Long Term Goals:   PT Long Term Goals - 05/15/21 0918       PT LONG TERM GOAL #1   Title Patient to report independence with HEP and compliance of completing >/= 3x/weekly    Baseline I with HEP; reports indpendence, not completing consistently    Time 4    Period Weeks    Status Revised    Target Date 06/21/21      PT LONG TERM GOAL #2   Title Patient will verbalize understanding of walking program and trying to walk >/= 20 minutes with intermittent  rest breaks daily    Baseline not ambulating frequently    Time 4    Period Weeks    Status New      PT LONG TERM GOAL #3   Title Patient will perform TUG w/ RW in </= 30 seconds to demo improved balance and stability    Baseline 03/27/21: 36.68 sec's with RW as baseline 04/19/2021: 43.27 sec with RW scored today. 12/14: 34.78 secs    Time 4    Period Weeks    Status Revised      PT LONG TERM GOAL #4   Title Patient to ambulate >/= 400 ft without rest break and supervision    Baseline 04/10/2021: pt ambulated 250 ft with RW under supervision/min guard; 345 ft with supervision/CGA    Time 4    Period Weeks    Status Revised      PT LONG TERM GOAL #5   Title Patient will improve Berg Balance to >/= 40/56 to demo improved balance and reduced fall risk    Baseline 04/19/2021: 32/56; 12/14: 37/56    Time 4    Period Weeks    Status Revised                Plan - 05/15/21 0912     Clinical Impression Statement Completed assesment of patient's progress toward LTGs. Patient making progress toward goals, but still shy of goal level with TUG and Berg Balance.  Patient did meet ambulatoin goal with ability to ambulate 345 ft in session with RW prior to needing seated rest break demo improved activity/ambulation tolerance. With TUG time and Lillie Fragmin patient is still at high fall risk. Patient is making steady progress and will conitnue to benefit from skilled PT services to address impairments and reduce fall risk.    Personal Factors and Comorbidities Comorbidity 2    Comorbidities CVA, obesity    Examination-Activity Limitations Stairs;Locomotion Level;Transfers    Examination-Participation Restrictions Community Activity;Meal Prep    Stability/Clinical Decision Making Stable/Uncomplicated    Rehab Potential Good    PT Frequency 2x / week    PT Duration 8 weeks    PT Treatment/Interventions ADLs/Self Care Home Management;Aquatic Therapy;DME Instruction;Gait training;Stair training;Functional mobility training;Therapeutic activities;Therapeutic exercise;Balance training;Neuromuscular re-education;Patient/family education    PT Next Visit Plan Review + Update HeP.  Continue with sit<>stands w/o UE support from lower heights, static/dynamic balance in // bars, and LE strengthening/stretching, DF/PF strengthening, stepping tasks and core/sitting balance challenges (increased challenge with this). Standing/ walking endurance.    PT Home Exercise Plan [id number]    Consulted and Agree with Plan of Care Patient             Patient will benefit from skilled therapeutic intervention in order to improve the following deficits and impairments:  Decreased endurance, Obesity, Decreased activity tolerance, Decreased strength, Decreased balance, Decreased mobility, Difficulty walking, Abnormal gait, Decreased safety awareness  Visit Diagnosis: Unsteadiness on feet  Muscle weakness (generalized)  Other abnormalities of gait and mobility  Difficulty in walking, not elsewhere classified     Problem List Patient Active Problem List   Diagnosis Date  Noted   Hemiparesis affecting right side as late effect of cerebrovascular accident (CVA) ([place]) 09/06/2020   Personal history of fall 09/06/2020   Chronic right shoulder pain 09/06/2020   Closed bimalleolar fracture of right ankle 07/27/2020   History of loop recorder 05/02/2020   Carotid artery disease ([place]) 05/02/2020   Right hemiparesis ([place]) 04/30/2020   Obesity (BMI 35.0-39.9  without comorbidity) 04/30/2020   Hyperlipidemia 04/26/2019   Hypokalemia    Acute embolic stroke [facility])    History of CVA (cerebrovascular accident) 02/11/2018   Acute kidney injury superimposed on chronic kidney disease ([place]) 02/11/2018   History of syphilis 02/11/2018   Constipation 02/11/2018   Osteoarthritis 02/11/2018   Benign essential HTN 02/11/2018   Acute encephalopathy 02/10/2018    Jones Bales, PT, DPT 05/15/2021, 9:18 AM  [facility] [address] [place], [place], [address] Phone: [phone]   Fax:  [phone]  Name: Robert Donovan MRN: [id number] Date of Birth: 12-Dec-1955

## 2021-05-16 DIAGNOSIS — G8193 Hemiplegia, unspecified affecting right nondominant side: Secondary | ICD-10-CM | POA: Diagnosis not present

## 2021-05-17 ENCOUNTER — Ambulatory Visit: Payer: Medicaid Other

## 2021-05-17 ENCOUNTER — Other Ambulatory Visit: Payer: Self-pay

## 2021-05-17 DIAGNOSIS — R262 Difficulty in walking, not elsewhere classified: Secondary | ICD-10-CM

## 2021-05-17 DIAGNOSIS — R2689 Other abnormalities of gait and mobility: Secondary | ICD-10-CM

## 2021-05-17 DIAGNOSIS — M6281 Muscle weakness (generalized): Secondary | ICD-10-CM

## 2021-05-17 DIAGNOSIS — R2681 Unsteadiness on feet: Secondary | ICD-10-CM

## 2021-05-17 NOTE — Therapy (Signed)
[facility] [address] [place], [place], [address] Phone: [phone]   Fax:  [phone]  Physical Therapy Treatment  Patient Details  Name: Robert Donovan MRN: [id number] Date of Birth: 22-Mar-1956 Referring Provider (PT): Jonah Blue MD   Encounter Date: 05/17/2021   PT End of Session - 05/17/21 0845     Visit Number 20    Number of Visits 29    Date for PT Re-Evaluation 06/21/21    Authorization Type UHC MCD    Authorization Time Period 27 visit limit combined disciplines    Authorization - Visit Number 19    Authorization - Number of Visits 27    Progress Note Due on Visit 29    PT Start Time 0845    PT Stop Time 0931    PT Time Calculation (min) 46 min    Equipment Utilized During Treatment Gait belt    Activity Tolerance Patient tolerated treatment well    Behavior During Therapy [facility] for tasks assessed/performed             Past Medical History:  Diagnosis Date   Arthritis    Bilateral Knees   High cholesterol    History of loop recorder 05/16/2019   Hypertension    Stroke (HCC) 02/10/2018    Past Surgical History:  Procedure Laterality Date   LOOP RECORDER IMPLANT  05/16/2019   TEE WITHOUT CARDIOVERSION N/A 02/16/2018   Procedure: TRANSESOPHAGEAL ECHOCARDIOGRAM (TEE);  Surgeon: Jodelle Red, MD;  Location: [facility] ENDOSCOPY;  Service: Cardiovascular;  Laterality: N/A;   TOOTH EXTRACTION Bilateral 10/19/2020   Procedure: DENTAL RESTORATION/EXTRACTIONS;  Surgeon: Ocie Doyne, DMD;  Location: [facility] OR;  Service: Oral Surgery;  Laterality: Bilateral;    There were no vitals filed for this visit.   Subjective Assessment - 05/17/21 0847     Subjective Reports doing good. No other new changes. No pain to report. No falls.    Pertinent History 1. Hemiparesis affecting right side as late effect of cerebrovascular accident (CVA) (HCC)  2. Ankle fracture, bimalleolar, closed, right, sequela  I  think it is reasonable for him to continue with PCS services due to residual right-sided weakness from stroke and gait disturbance associated with the stroke and his ankle fracture.     3. Chronic right shoulder pain  We will get an x-ray of the shoulder to see whether he has developed some arthritis in the shoulder.  Further management will be based on results.  - DG Shoulder Right; Future     4. History of syphilis  -Patient requesting follow-up visit with infectious disease specialist as he was told.  - Ambulatory referral to Infectious Disease    Limitations Walking;Standing    How long can you sit comfortably? >15 min    How long can you stand comfortably? <5 min    How long can you walk comfortably? n/a    Patient Stated Goals To improve my mobility    Currently in Pain? No/denies               [facility] Adult PT Treatment/Exercise - 05/17/21 0001       Transfers   Transfers Sit to Stand;Stand to Sit    Sit to Stand 5: Supervision;4: Min guard    Stand to Sit 5: Supervision;4: Min guard    Number of Reps 2 sets;Other reps (comment)   5 reps   Comments with BUE support on knees working on standing up tall and controlled descent  Ambulation/Gait   Ambulation/Gait Yes    Ambulation/Gait Assistance 5: Supervision;4: Min guard    Ambulation/Gait Assistance Details completed ambulation with RW x 350 ft with supervision/CGA without rest breaks today. intermittent cues to stand tall through RLE with stance phase.    Ambulation Distance (Feet) 350 Feet    Assistive device Rolling walker    Gait Pattern Step-through pattern;Decreased step length - right;Narrow base of support;Decreased stance time - right;Right foot flat;Poor foot clearance - right    Ambulation Surface Level;Indoor      Exercises   Exercises Knee/Hip      Knee/Hip Exercises: Seated   Long Arc Quad Strengthening;Both;1 set;Limitations;10 reps    Long Arc Quad Limitations alternating seated EOB      Knee/Hip  Exercises: Supine   Bridges Strengthening;Both;10 reps;2 sets    Bridges Limitations completed bridges x 10 reps, then second set with ball squeeze and bridges x 10 reps. added to HEP    Straight Leg Raises Strengthening;Both;1 set;10 reps;Limitations    Straight Leg Raises Limitations extension lag noted due to quad weakness on RLE, but still notedimproved quad contraction vs. quad set with piullow.    Other Supine Knee/Hip Exercises with red theraband completd supine alternating march, 2 x 10 reps. then with red theraband, completed alternating bent knee fall outs 2 x 10 reps, with cues to maintain opposite LE stable with fall out, tactile cues provided. added to HEP. trialed quad set with pillow, but increased challenge with proper completion, x 10 reps.             Updated and reviewed entire HEP:  Access Code: [email or web address] URL: [email or web address] Date: 05/17/2021 Prepared by: Jethro Bastos  Exercises Sit to Stand with Hands on Knees - 1 x daily - 5 x weekly - 2 sets - 5 reps Supine Bridge - 1 x daily - 5 x weekly - 2 sets - 10 reps Hooklying Single Leg Bent Knee Fallouts with Resistance - 1 x daily - 5 x weekly - 2 sets - 10 reps Supine March with Resistance Band - 1 x daily - 5 x weekly - 2 sets - 10 reps Small Range Straight Leg Raise - 1 x daily - 5 x weekly - 2 sets - 5 reps Ankle Inversion Eversion Towel Slide - 2 x daily - 7 x weekly - 2 sets - 10 reps Seated Heel Toe Raises - 1 x daily - 5 x weekly - 1 sets - 10 reps Seated Long Arc Quad - 1 x daily - 5 x weekly - 1 sets - 10 reps    PT Education - 05/17/21 0924     Education Details Updated HEP    Person(s) Educated Patient    Methods Explanation;Demonstration;Handout    Comprehension Verbalized understanding;Returned demonstration              PT Short Term Goals - 05/15/21 0923       PT SHORT TERM GOAL #1   Title = LTGs               PT Long Term Goals - 05/15/21 0918       PT  LONG TERM GOAL #1   Title Patient to report independence with HEP and compliance of completing >/= 3x/weekly    Baseline I with HEP; reports indpendence, not completing consistently    Time 4    Period Weeks    Status Revised    Target Date 06/21/21      PT  LONG TERM GOAL #2   Title Patient will verbalize understanding of walking program and trying to walk >/= 20 minutes with intermittent rest breaks daily    Baseline not ambulating frequently    Time 4    Period Weeks    Status New      PT LONG TERM GOAL #3   Title Patient will perform TUG w/ RW in </= 30 seconds to demo improved balance and stability    Baseline 03/27/21: 36.68 sec's with RW as baseline 04/19/2021: 43.27 sec with RW scored today. 12/14: 34.78 secs    Time 4    Period Weeks    Status Revised      PT LONG TERM GOAL #4   Title Patient to ambulate >/= 400 ft without rest break and supervision    Baseline 04/10/2021: pt ambulated 250 ft with RW under supervision/min guard; 345 ft with supervision/CGA    Time 4    Period Weeks    Status Revised      PT LONG TERM GOAL #5   Title Patient will improve Berg Balance to >/= 40/56 to demo improved balance and reduced fall risk    Baseline 04/19/2021: 32/56; 12/14: 37/56    Time 4    Period Weeks    Status Revised                   Plan - 05/17/21 0175     Clinical Impression Statement Continued gait training focus on increased ambulation distance, able to ambulate x 345 ft without rest break. Rest of session focused on review and progressing HEP. Will continue to progress toward LTGs.    Personal Factors and Comorbidities Comorbidity 2    Comorbidities CVA, obesity    Examination-Activity Limitations Stairs;Locomotion Level;Transfers    Examination-Participation Restrictions Community Activity;Meal Prep    Stability/Clinical Decision Making Stable/Uncomplicated    Rehab Potential Good    PT Frequency 2x / week    PT Duration 8 weeks    PT  Treatment/Interventions ADLs/Self Care Home Management;Aquatic Therapy;DME Instruction;Gait training;Stair training;Functional mobility training;Therapeutic activities;Therapeutic exercise;Balance training;Neuromuscular re-education;Patient/family education    PT Next Visit Plan How was HEP update?  Continue with sit<>stands w/o UE support from lower heights, static/dynamic balance in // bars, and LE strengthening/stretching, DF/PF strengthening, stepping tasks and core/sitting balance challenges (increased challenge with this). Standing/ walking endurance.    PT Home Exercise Plan [email or web address]    Consulted and Agree with Plan of Care Patient             Patient will benefit from skilled therapeutic intervention in order to improve the following deficits and impairments:  Decreased endurance, Obesity, Decreased activity tolerance, Decreased strength, Decreased balance, Decreased mobility, Difficulty walking, Abnormal gait, Decreased safety awareness  Visit Diagnosis: Unsteadiness on feet  Muscle weakness (generalized)  Other abnormalities of gait and mobility  Difficulty in walking, not elsewhere classified     Problem List Patient Active Problem List   Diagnosis Date Noted   Hemiparesis affecting right side as late effect of cerebrovascular accident (CVA) (HCC) 09/06/2020   Personal history of fall 09/06/2020   Chronic right shoulder pain 09/06/2020   Closed bimalleolar fracture of right ankle 07/27/2020   History of loop recorder 05/02/2020   Carotid artery disease (HCC) 05/02/2020   Right hemiparesis (HCC) 04/30/2020   Obesity (BMI 35.0-39.9 without comorbidity) 04/30/2020   Hyperlipidemia 04/26/2019   Hypokalemia    Acute embolic stroke (HCC)    History of CVA (cerebrovascular accident)  02/11/2018   Acute kidney injury superimposed on chronic kidney disease (HCC) 02/11/2018   History of syphilis 02/11/2018   Constipation 02/11/2018   Osteoarthritis 02/11/2018   Benign  essential HTN 02/11/2018   Acute encephalopathy 02/10/2018    Tempie Donning, PT, DPT 05/17/2021, 9:40 AM  [facility] [address] [place], [place], [address] Phone: [phone]   Fax:  [phone]  Name: Kaesen Rodriguez MRN: [id number] Date of Birth: 12/12/1955

## 2021-05-17 NOTE — Patient Instructions (Signed)
Access Code: [email or web address] URL: [email or web address] Date: 05/17/2021 Prepared by: Jethro Bastos  Exercises Sit to Stand with Hands on Knees - 1 x daily - 5 x weekly - 2 sets - 5 reps Supine Bridge - 1 x daily - 5 x weekly - 2 sets - 10 reps Hooklying Single Leg Bent Knee Fallouts with Resistance - 1 x daily - 5 x weekly - 2 sets - 10 reps Supine March with Resistance Band - 1 x daily - 5 x weekly - 2 sets - 10 reps Small Range Straight Leg Raise - 1 x daily - 5 x weekly - 2 sets - 5 reps Ankle Inversion Eversion Towel Slide - 2 x daily - 7 x weekly - 2 sets - 10 reps Seated Heel Toe Raises - 1 x daily - 5 x weekly - 1 sets - 10 reps Seated Long Arc Quad - 1 x daily - 5 x weekly - 1 sets - 10 reps

## 2021-05-20 DIAGNOSIS — G8193 Hemiplegia, unspecified affecting right nondominant side: Secondary | ICD-10-CM | POA: Diagnosis not present

## 2021-05-21 ENCOUNTER — Telehealth: Payer: Self-pay | Admitting: Internal Medicine

## 2021-05-21 DIAGNOSIS — G8193 Hemiplegia, unspecified affecting right nondominant side: Secondary | ICD-10-CM | POA: Diagnosis not present

## 2021-05-21 NOTE — Telephone Encounter (Signed)
.. °  Medicaid Managed Care   Unsuccessful Outreach Note  05/21/2021 Name: Robert Donovan MRN: [id number] DOB: 17-Nov-1955  Referred by: [name], MD Reason for referral : High Risk Managed Medicaid (I called the patient today to get him rescheduled for a phone visit with the MM RNCM. I left my name and number on his VM.)   An unsuccessful telephone outreach was attempted today. The patient was referred to the case management team for assistance with care management and care coordination.   Follow Up Plan: The care management team will reach out to the patient again over the next 7 days.   [name] Care Guide, High Risk Medicaid Managed Care Embedded Care Coordination [facility]   [facility]

## 2021-05-22 ENCOUNTER — Other Ambulatory Visit: Payer: Self-pay

## 2021-05-22 ENCOUNTER — Ambulatory Visit: Payer: Medicaid Other | Admitting: Physical Therapy

## 2021-05-22 DIAGNOSIS — G8193 Hemiplegia, unspecified affecting right nondominant side: Secondary | ICD-10-CM | POA: Diagnosis not present

## 2021-05-23 DIAGNOSIS — G8193 Hemiplegia, unspecified affecting right nondominant side: Secondary | ICD-10-CM | POA: Diagnosis not present

## 2021-05-23 LAB — CUP PACEART REMOTE DEVICE CHECK
Date Time Interrogation Session: 20221217230931
Implantable Pulse Generator Implant Date: 20201214

## 2021-05-24 ENCOUNTER — Ambulatory Visit: Payer: Medicaid Other

## 2021-05-24 DIAGNOSIS — G8193 Hemiplegia, unspecified affecting right nondominant side: Secondary | ICD-10-CM | POA: Diagnosis not present

## 2021-05-27 DIAGNOSIS — G8193 Hemiplegia, unspecified affecting right nondominant side: Secondary | ICD-10-CM | POA: Diagnosis not present

## 2021-05-28 DIAGNOSIS — G8193 Hemiplegia, unspecified affecting right nondominant side: Secondary | ICD-10-CM | POA: Diagnosis not present

## 2021-05-29 ENCOUNTER — Encounter: Payer: Self-pay | Admitting: Podiatry

## 2021-05-29 ENCOUNTER — Ambulatory Visit (INDEPENDENT_AMBULATORY_CARE_PROVIDER_SITE_OTHER): Payer: Medicaid Other

## 2021-05-29 ENCOUNTER — Other Ambulatory Visit: Payer: Self-pay

## 2021-05-29 ENCOUNTER — Ambulatory Visit (INDEPENDENT_AMBULATORY_CARE_PROVIDER_SITE_OTHER): Payer: Medicaid Other | Admitting: Podiatry

## 2021-05-29 DIAGNOSIS — N189 Chronic kidney disease, unspecified: Secondary | ICD-10-CM

## 2021-05-29 DIAGNOSIS — M79675 Pain in left toe(s): Secondary | ICD-10-CM | POA: Diagnosis not present

## 2021-05-29 DIAGNOSIS — M79674 Pain in right toe(s): Secondary | ICD-10-CM | POA: Diagnosis not present

## 2021-05-29 DIAGNOSIS — I639 Cerebral infarction, unspecified: Secondary | ICD-10-CM

## 2021-05-29 DIAGNOSIS — B351 Tinea unguium: Secondary | ICD-10-CM

## 2021-05-29 DIAGNOSIS — D689 Coagulation defect, unspecified: Secondary | ICD-10-CM

## 2021-05-29 DIAGNOSIS — G8193 Hemiplegia, unspecified affecting right nondominant side: Secondary | ICD-10-CM | POA: Diagnosis not present

## 2021-05-29 DIAGNOSIS — I739 Peripheral vascular disease, unspecified: Secondary | ICD-10-CM

## 2021-05-29 DIAGNOSIS — N179 Acute kidney failure, unspecified: Secondary | ICD-10-CM

## 2021-05-29 NOTE — Progress Notes (Signed)
This patient returns to the office for at risk foot care.  This patient requires this care by a professional since this patient will be at risk due to having swelling, peripheral angiopathy acute kidney disease and symtomatic toenails.    These nails are painful walking and wearing shoes.  This patient presents for at risk foot care today.  Patient has broken foot and presents to the office in a wheelchair with his mother  General Appearance  Alert, conversant and in no acute stress.  Vascular  Dorsalis pedis palpable and posterior tibial  pulses are non palpable  bilaterally.  Capillary return is within normal limits  bilaterally. Temperature is within normal limits  Bilaterally. Bilateral swelling is present   Neurologic  Senn-Weinstein monofilament wire test within normal limits  bilaterally. Muscle power within normal limits bilaterally.  Nails Thick disfigured discolored nails with subungual debris  from hallux to fifth toes bilaterally. No evidence of bacterial infection or drainage bilaterally.   Orthopedic  No limitations of motion  feet .  No crepitus or effusions noted.  No bony pathology or digital deformities noted.  Significant ankle swelling right ankle.  Skin  normotropic skin with no porokeratosis noted bilaterally.  No signs of infections or ulcers noted.    I  Assessment:  Onychomycosis  Pain in right toes  Pain in left toes, peripheral angiopathy with swelling bilateral.   Plan: Consent was obtained for treatment procedures.   Mechanical debridement of nails 1-5  bilaterally performed with a nail nipper.  Filed with dremel without incident.    Return office visit   3 months.    Told patient to return for periodic foot care and evaluation due to potential at risk complications.  Helane Gunther DPM

## 2021-05-30 ENCOUNTER — Telehealth: Payer: Self-pay | Admitting: Internal Medicine

## 2021-05-30 DIAGNOSIS — G8193 Hemiplegia, unspecified affecting right nondominant side: Secondary | ICD-10-CM | POA: Diagnosis not present

## 2021-05-30 NOTE — Telephone Encounter (Signed)
.. °  Medicaid Managed Care   Unsuccessful Outreach Note  05/30/2021 Name: Robert Donovan MRN: [id number] DOB: 1955/09/03  Referred by: [name], MD Reason for referral : High Risk Managed Medicaid (I called the patient today to get him rescheduled for a phone visit with the MM RNCM. I left my name and number on his VM.)   A second unsuccessful telephone outreach was attempted today. The patient was referred to the case management team for assistance with care management and care coordination.   Follow Up Plan: The care management team will reach out to the patient again over the next 7 days.    [name] Care Guide, High Risk Medicaid Managed Care Embedded Care Coordination [facility]   [facility]

## 2021-05-31 ENCOUNTER — Other Ambulatory Visit: Payer: Self-pay

## 2021-05-31 ENCOUNTER — Ambulatory Visit: Payer: Medicaid Other

## 2021-05-31 DIAGNOSIS — R2681 Unsteadiness on feet: Secondary | ICD-10-CM

## 2021-05-31 DIAGNOSIS — R2689 Other abnormalities of gait and mobility: Secondary | ICD-10-CM

## 2021-05-31 DIAGNOSIS — M6281 Muscle weakness (generalized): Secondary | ICD-10-CM

## 2021-05-31 DIAGNOSIS — R262 Difficulty in walking, not elsewhere classified: Secondary | ICD-10-CM

## 2021-05-31 NOTE — Therapy (Signed)
[facility] [address] [place], [place], [address] Phone: [phone]   Fax:  [phone]  Physical Therapy Treatment  Patient Details  Name: Robert Donovan MRN: [id number] Date of Birth: 1955-06-14 Referring Provider (PT): Jonah Blue MD   Encounter Date: 05/31/2021   PT End of Session - 05/31/21 0804     Visit Number 21    Number of Visits 29    Date for PT Re-Evaluation 06/21/21    Authorization Type UHC MCD    Authorization Time Period 27 visit limit combined disciplines    Authorization - Visit Number 20    Authorization - Number of Visits 27    Progress Note Due on Visit 29    PT Start Time 0802    PT Stop Time 0847    PT Time Calculation (min) 45 min    Equipment Utilized During Treatment Gait belt    Activity Tolerance Patient tolerated treatment well    Behavior During Therapy [facility] for tasks assessed/performed             Past Medical History:  Diagnosis Date   Arthritis    Bilateral Knees   High cholesterol    History of loop recorder 05/16/2019   Hypertension    Stroke (HCC) 02/10/2018    Past Surgical History:  Procedure Laterality Date   LOOP RECORDER IMPLANT  05/16/2019   TEE WITHOUT CARDIOVERSION N/A 02/16/2018   Procedure: TRANSESOPHAGEAL ECHOCARDIOGRAM (TEE);  Surgeon: Jodelle Red, MD;  Location: [facility] ENDOSCOPY;  Service: Cardiovascular;  Laterality: N/A;   TOOTH EXTRACTION Bilateral 10/19/2020   Procedure: DENTAL RESTORATION/EXTRACTIONS;  Surgeon: Ocie Doyne, DMD;  Location: [facility] OR;  Service: Oral Surgery;  Laterality: Bilateral;    There were no vitals filed for this visit.   Subjective Assessment - 05/31/21 0805     Subjective No falls. Reports he tried some of the exercises, but not all. No other new changes per patient report.    Pertinent History 1. Hemiparesis affecting right side as late effect of cerebrovascular accident (CVA) (HCC)  2. Ankle fracture,  bimalleolar, closed, right, sequela  I think it is reasonable for him to continue with PCS services due to residual right-sided weakness from stroke and gait disturbance associated with the stroke and his ankle fracture.     3. Chronic right shoulder pain  We will get an x-ray of the shoulder to see whether he has developed some arthritis in the shoulder.  Further management will be based on results.  - DG Shoulder Right; Future     4. History of syphilis  -Patient requesting follow-up visit with infectious disease specialist as he was told.  - Ambulatory referral to Infectious Disease    Limitations Walking;Standing    How long can you sit comfortably? >15 min    How long can you stand comfortably? <5 min    How long can you walk comfortably? n/a    Patient Stated Goals To improve my mobility    Currently in Pain? No/denies                 [facility] Adult PT Treatment/Exercise - 05/31/21 0001       Transfers   Transfers Sit to Stand;Stand to Sit    Sit to Stand 5: Supervision;4: Min guard    Stand to Sit 5: Supervision;4: Min guard    Number of Reps 2 sets;Other reps (comment)   5 reps; see details below.   Comments With  mat  at approx 19" completed sit <> stands x 5 reps, then slow progressing to height of 18" to improve ability to stand from low surfaces (including scooter), completed x 5 reps. CGA required and cues to push through BLE. UE support on bil knees.      Ambulation/Gait   Ambulation/Gait Yes    Ambulation/Gait Assistance 5: Supervision;4: Min guard    Ambulation/Gait Assistance Details completed ambulation with RW x 460 ft with supervision/CGA without rest breaks today. Pt continue to provide cues to stand tall with stance on RLE and to promote/maintain hip extension. Then after activities completed an additional 115 ft at end of session. Patient tolerating well.    Ambulation Distance (Feet) 460 Feet    Assistive device Rolling walker    Gait Pattern Step-through  pattern;Decreased step length - right;Narrow base of support;Decreased stance time - right;Right foot flat;Poor foot clearance - right    Ambulation Surface Level;Indoor    Gait Comments PT provided new tennis balls for patient's personal RW for improved propulsion      Neuro Re-ed    Neuro Re-ed Details  Seated on green air disc: completed alternating LAQ x 10 reps bilat, followed by alternating marching x 10 reps bilat. increased challenge with RLE > LLE. Cues for upright seated for improved core activation. Then seated on green air disc  completed trunk rotation/weight shift activity reaching across midline x 6  reps to bilat sides. Increased challenge with R weight shift when reaching to R side, but increased challenge with use of RUE when reaching to L side.                       PT Short Term Goals - 05/15/21 0923       PT SHORT TERM GOAL #1   Title = LTGs               PT Long Term Goals - 05/15/21 0918       PT LONG TERM GOAL #1   Title Patient to report independence with HEP and compliance of completing >/= 3x/weekly    Baseline I with HEP; reports indpendence, not completing consistently    Time 4    Period Weeks    Status Revised    Target Date 06/21/21      PT LONG TERM GOAL #2   Title Patient will verbalize understanding of walking program and trying to walk >/= 20 minutes with intermittent rest breaks daily    Baseline not ambulating frequently    Time 4    Period Weeks    Status New      PT LONG TERM GOAL #3   Title Patient will perform TUG w/ RW in </= 30 seconds to demo improved balance and stability    Baseline 03/27/21: 36.68 sec's with RW as baseline 04/19/2021: 43.27 sec with RW scored today. 12/14: 34.78 secs    Time 4    Period Weeks    Status Revised      PT LONG TERM GOAL #4   Title Patient to ambulate >/= 400 ft without rest break and supervision    Baseline 04/10/2021: pt ambulated 250 ft with RW under supervision/min guard; 345  ft with supervision/CGA    Time 4    Period Weeks    Status Revised      PT LONG TERM GOAL #5   Title Patient will improve Berg Balance to >/= 40/56 to demo improved balance and reduced fall  risk    Baseline 04/19/2021: 32/56; 12/14: 37/56    Time 4    Period Weeks    Status Revised                   Plan - 05/31/21 0856     Clinical Impression Statement Continued gait training with patient continue to tolerate increased gait distance without rest break, with pt able to ambulate 460 ft today prior to needing seated break. Rest of session continued to focus on seated core stability with reaching tasks and BLE strengthening. WIll continue to progress toward all LTGs.    Personal Factors and Comorbidities Comorbidity 2    Comorbidities CVA, obesity    Examination-Activity Limitations Stairs;Locomotion Level;Transfers    Examination-Participation Restrictions Community Activity;Meal Prep    Stability/Clinical Decision Making Stable/Uncomplicated    Rehab Potential Good    PT Frequency 2x / week    PT Duration 8 weeks    PT Treatment/Interventions ADLs/Self Care Home Management;Aquatic Therapy;DME Instruction;Gait training;Stair training;Functional mobility training;Therapeutic activities;Therapeutic exercise;Balance training;Neuromuscular re-education;Patient/family education    PT Next Visit Plan Continue with sit<>stands w/o UE support from lower heights, static/dynamic balance in // bars, and LE strengthening/stretching, DF/PF strengthening, stepping tasks and core/sitting balance challenges (increased challenge with this). Standing/ walking endurance.    PT Home Exercise Plan [email or web address]    Consulted and Agree with Plan of Care Patient             Patient will benefit from skilled therapeutic intervention in order to improve the following deficits and impairments:  Decreased endurance, Obesity, Decreased activity tolerance, Decreased strength, Decreased balance, Decreased  mobility, Difficulty walking, Abnormal gait, Decreased safety awareness  Visit Diagnosis: Unsteadiness on feet  Muscle weakness (generalized)  Other abnormalities of gait and mobility  Difficulty in walking, not elsewhere classified     Problem List Patient Active Problem List   Diagnosis Date Noted   Hemiparesis affecting right side as late effect of cerebrovascular accident (CVA) (HCC) 09/06/2020   Personal history of fall 09/06/2020   Chronic right shoulder pain 09/06/2020   Closed bimalleolar fracture of right ankle 07/27/2020   History of loop recorder 05/02/2020   Carotid artery disease (HCC) 05/02/2020   Right hemiparesis (HCC) 04/30/2020   Obesity (BMI 35.0-39.9 without comorbidity) 04/30/2020   Hyperlipidemia 04/26/2019   Hypokalemia    Acute embolic stroke (HCC)    History of CVA (cerebrovascular accident) 02/11/2018   Acute kidney injury superimposed on chronic kidney disease (HCC) 02/11/2018   History of syphilis 02/11/2018   Constipation 02/11/2018   Osteoarthritis 02/11/2018   Benign essential HTN 02/11/2018   Acute encephalopathy 02/10/2018    Tempie Donning, PT, DPT 05/31/2021, 9:00 AM  [facility] [address] [place], [place], [address] Phone: [phone]   Fax:  [phone]  Name: Martrell Eguia MRN: [id number] Date of Birth: 10-31-1955

## 2021-06-02 DIAGNOSIS — R531 Weakness: Secondary | ICD-10-CM | POA: Diagnosis not present

## 2021-06-03 DIAGNOSIS — G8193 Hemiplegia, unspecified affecting right nondominant side: Secondary | ICD-10-CM | POA: Diagnosis not present

## 2021-06-04 ENCOUNTER — Other Ambulatory Visit: Payer: Self-pay

## 2021-06-04 DIAGNOSIS — G8193 Hemiplegia, unspecified affecting right nondominant side: Secondary | ICD-10-CM | POA: Diagnosis not present

## 2021-06-04 NOTE — Patient Instructions (Signed)
Visit Information  Mr. Daundre Huntsberger  - as a part of your Medicaid benefit, you are eligible for care management and care coordination services at no cost or copay. I was unable to reach you by phone today but would be happy to help you with your health related needs. Please feel free to call me @ [phone].   A member of the Managed Medicaid care management team will reach out to you again over the next 30 days.   Mickel Fuchs, BSW, [place] Managed Medicaid Team  [phone]

## 2021-06-04 NOTE — Patient Outreach (Signed)
Care Coordination  06/04/2021  Robert Donovan [birth year] [id number]   Medicaid Managed Care   Unsuccessful Outreach Note  06/04/2021 Name: Robert Donovan MRN: [id number] DOB: 08-05-1955  Referred by: Ladell Pier, MD Reason for referral : High Risk Managed Medicaid ([facility] [employer])   An unsuccessful telephone outreach was attempted today. The patient was referred to the case management team for assistance with care management and care coordination.   Follow Up Plan: The care management team will reach out to the patient again over the next 30 days.   Mickel Fuchs, BSW, [place] Managed Medicaid Team  [phone]

## 2021-06-05 ENCOUNTER — Encounter: Payer: Self-pay | Admitting: Physical Therapy

## 2021-06-05 ENCOUNTER — Ambulatory Visit: Payer: Medicaid Other | Attending: Internal Medicine | Admitting: Physical Therapy

## 2021-06-05 ENCOUNTER — Other Ambulatory Visit: Payer: Self-pay

## 2021-06-05 DIAGNOSIS — M6281 Muscle weakness (generalized): Secondary | ICD-10-CM | POA: Diagnosis present

## 2021-06-05 DIAGNOSIS — R2681 Unsteadiness on feet: Secondary | ICD-10-CM | POA: Insufficient documentation

## 2021-06-05 DIAGNOSIS — G8193 Hemiplegia, unspecified affecting right nondominant side: Secondary | ICD-10-CM | POA: Diagnosis not present

## 2021-06-05 DIAGNOSIS — R262 Difficulty in walking, not elsewhere classified: Secondary | ICD-10-CM | POA: Insufficient documentation

## 2021-06-05 DIAGNOSIS — R2689 Other abnormalities of gait and mobility: Secondary | ICD-10-CM | POA: Diagnosis present

## 2021-06-05 DIAGNOSIS — R269 Unspecified abnormalities of gait and mobility: Secondary | ICD-10-CM | POA: Diagnosis present

## 2021-06-05 NOTE — Therapy (Signed)
[facility] [address] [place], [place], [address] Phone: [phone]   Fax:  [phone]  Physical Therapy Treatment  Patient Details  Name: Robert Donovan MRN: [id number] Date of Birth: [birth year] Referring Provider (PT): Jonah Blue MD   Encounter Date: 06/05/2021   PT End of Session - 06/05/21 1036     Visit Number 22    Number of Visits 29    Date for PT Re-Evaluation 06/21/21    Authorization Type UHC MCD    Authorization Time Period 27 visit limit combined disciplines    Authorization - Visit Number 1   started over in 2023   Authorization - Number of Visits 27    Progress Note Due on Visit 29    PT Start Time 1033   started early this session   PT Stop Time 1115    PT Time Calculation (min) 42 min    Equipment Utilized During Treatment Gait belt    Activity Tolerance Patient tolerated treatment well    Behavior During Therapy [facility] for tasks assessed/performed             Past Medical History:  Diagnosis Date   Arthritis    Bilateral Knees   High cholesterol    History of loop recorder 05/16/2019   Hypertension    Stroke (HCC) 02/10/2018    Past Surgical History:  Procedure Laterality Date   LOOP RECORDER IMPLANT  05/16/2019   TEE WITHOUT CARDIOVERSION N/A 02/16/2018   Procedure: TRANSESOPHAGEAL ECHOCARDIOGRAM (TEE);  Surgeon: Jodelle Red, MD;  Location: [facility] ENDOSCOPY;  Service: Cardiovascular;  Laterality: N/A;   TOOTH EXTRACTION Bilateral 10/19/2020   Procedure: DENTAL RESTORATION/EXTRACTIONS;  Surgeon: Ocie Doyne, DMD;  Location: [facility] OR;  Service: Oral Surgery;  Laterality: Bilateral;    There were no vitals filed for this visit.   Subjective Assessment - 06/05/21 1035     Subjective No new complaitns. No falls or pain to report.    Pertinent History 1. Hemiparesis affecting right side as late effect of cerebrovascular accident (CVA) (HCC)  2. Ankle fracture, bimalleolar,  closed, right, sequela  I think it is reasonable for him to continue with PCS services due to residual right-sided weakness from stroke and gait disturbance associated with the stroke and his ankle fracture.     3. Chronic right shoulder pain  We will get an x-ray of the shoulder to see whether he has developed some arthritis in the shoulder.  Further management will be based on results.  - DG Shoulder Right; Future     4. History of syphilis  -Patient requesting follow-up visit with infectious disease specialist as he was told.  - Ambulatory referral to Infectious Disease    Limitations Walking;Standing    How long can you sit comfortably? >15 min    How long can you stand comfortably? <5 min    How long can you walk comfortably? n/a    Patient Stated Goals To improve my mobility    Currently in Pain? No/denies    Pain Score 0-No pain                    [facility] Adult PT Treatment/Exercise - 06/05/21 1037       Transfers   Transfers Sit to Stand;Stand to Sit    Sit to Stand 5: Supervision;4: Min guard;4: Min assist    Stand to Sit 5: Supervision;4: Min guard    Comments with mat at 22 inches for 5  reps, then 20 inches for 5 reps iwht UE support for controlled descent only, then from 18 inch height for 5 reps with UE support for controlled descent. cues to scoot to edge, for increased anterior weight shifting to assist with standing. min guard to occasional min assist needed.      Ambulation/Gait   Ambulation/Gait Yes    Ambulation/Gait Assistance 5: Supervision;4: Min guard    Ambulation/Gait Assistance Details cues for posture and right step placement at times with gait.    Ambulation Distance (Feet) 300 Feet   x1   Assistive device Rolling walker    Gait Pattern Step-through pattern;Decreased step length - right;Narrow base of support;Decreased stance time - right;Right foot flat;Poor foot clearance - right    Ambulation Surface Level;Indoor      Neuro Re-ed    Neuro Re-ed  Details  for strengthening/muscle re-ed: seated on green air disc- LE's alternating long arc quads and marching for 10 reps each. increased difficulty with right>left LE. then with 2# weighted ball- UE raises for 10 reps with cues to stay in neutral, then upper trunk rotation from knee<>over opposite shoulder for 10 reps each way. min guard assist for safety.                   PT Short Term Goals - 05/15/21 0923       PT SHORT TERM GOAL #1   Title = LTGs               PT Long Term Goals - 05/15/21 0918       PT LONG TERM GOAL #1   Title Patient to report independence with HEP and compliance of completing >/= 3x/weekly    Baseline I with HEP; reports indpendence, not completing consistently    Time 4    Period Weeks    Status Revised    Target Date 06/21/21      PT LONG TERM GOAL #2   Title Patient will verbalize understanding of walking program and trying to walk >/= 20 minutes with intermittent rest breaks daily    Baseline not ambulating frequently    Time 4    Period Weeks    Status New      PT LONG TERM GOAL #3   Title Patient will perform TUG w/ RW in </= 30 seconds to demo improved balance and stability    Baseline 03/27/21: 36.68 sec's with RW as baseline 04/19/2021: 43.27 sec with RW scored today. 12/14: 34.78 secs    Time 4    Period Weeks    Status Revised      PT LONG TERM GOAL #4   Title Patient to ambulate >/= 400 ft without rest break and supervision    Baseline 04/10/2021: pt ambulated 250 ft with RW under supervision/min guard; 345 ft with supervision/CGA    Time 4    Period Weeks    Status Revised      PT LONG TERM GOAL #5   Title Patient will improve Berg Balance to >/= 40/56 to demo improved balance and reduced fall risk    Baseline 04/19/2021: 32/56; 12/14: 37/56    Time 4    Period Weeks    Status Revised                   Plan - 06/05/21 1037     Clinical Impression Statement Today's skilled session continued to focus  on gait with RW and strengthening. No issues reported or  noted in session with rest breaks taken as needed. The pt is making steady progress toward goals and should benefit from continued PT to progress toward unmet goals.    Personal Factors and Comorbidities Comorbidity 2    Comorbidities CVA, obesity    Examination-Activity Limitations Stairs;Locomotion Level;Transfers    Examination-Participation Restrictions Community Activity;Meal Prep    Stability/Clinical Decision Making Stable/Uncomplicated    Rehab Potential Good    PT Frequency 2x / week    PT Duration 8 weeks    PT Treatment/Interventions ADLs/Self Care Home Management;Aquatic Therapy;DME Instruction;Gait training;Stair training;Functional mobility training;Therapeutic activities;Therapeutic exercise;Balance training;Neuromuscular re-education;Patient/family education    PT Next Visit Plan Continue with sit<>stands w/o UE support from lower heights, static/dynamic balance in // bars, and LE strengthening/stretching, DF/PF strengthening, stepping tasks and core/sitting balance challenges (increased challenge with this). Standing/ walking endurance.    PT Home Exercise Plan [id number]    Consulted and Agree with Plan of Care Patient             Patient will benefit from skilled therapeutic intervention in order to improve the following deficits and impairments:  Decreased endurance, Obesity, Decreased activity tolerance, Decreased strength, Decreased balance, Decreased mobility, Difficulty walking, Abnormal gait, Decreased safety awareness  Visit Diagnosis: Unsteadiness on feet  Muscle weakness (generalized)  Other abnormalities of gait and mobility  Difficulty in walking, not elsewhere classified     Problem List Patient Active Problem List   Diagnosis Date Noted   Hemiparesis affecting right side as late effect of cerebrovascular accident (CVA) (HCC) 09/06/2020   Personal history of fall 09/06/2020   Chronic right  shoulder pain 09/06/2020   Closed bimalleolar fracture of right ankle 07/27/2020   History of loop recorder 05/02/2020   Carotid artery disease (HCC) 05/02/2020   Right hemiparesis (HCC) 04/30/2020   Obesity (BMI 35.0-39.9 without comorbidity) 04/30/2020   Hyperlipidemia 04/26/2019   Hypokalemia    Acute embolic stroke (HCC)    History of CVA (cerebrovascular accident) 02/11/2018   Acute kidney injury superimposed on chronic kidney disease (HCC) 02/11/2018   History of syphilis 02/11/2018   Constipation 02/11/2018   Osteoarthritis 02/11/2018   Benign essential HTN 02/11/2018   Acute encephalopathy 02/10/2018   Sallyanne Kuster, PTA, [facility] [address] [place], [place] [address] [phone] 06/05/21, 11:30 AM   Name: Robert Donovan MRN: [id number] Date of Birth: 12-01-1955

## 2021-06-06 DIAGNOSIS — G8193 Hemiplegia, unspecified affecting right nondominant side: Secondary | ICD-10-CM | POA: Diagnosis not present

## 2021-06-07 ENCOUNTER — Other Ambulatory Visit: Payer: Self-pay

## 2021-06-07 ENCOUNTER — Encounter: Payer: Self-pay | Admitting: Physical Therapy

## 2021-06-07 ENCOUNTER — Ambulatory Visit: Payer: Medicaid Other | Admitting: Physical Therapy

## 2021-06-07 DIAGNOSIS — R262 Difficulty in walking, not elsewhere classified: Secondary | ICD-10-CM

## 2021-06-07 DIAGNOSIS — R2689 Other abnormalities of gait and mobility: Secondary | ICD-10-CM

## 2021-06-07 DIAGNOSIS — R2681 Unsteadiness on feet: Secondary | ICD-10-CM | POA: Diagnosis not present

## 2021-06-07 DIAGNOSIS — M6281 Muscle weakness (generalized): Secondary | ICD-10-CM

## 2021-06-07 DIAGNOSIS — G8193 Hemiplegia, unspecified affecting right nondominant side: Secondary | ICD-10-CM | POA: Diagnosis not present

## 2021-06-09 NOTE — Therapy (Signed)
[facility] [address] [place], [place], [address] Phone: [phone]   Fax:  [phone]  Physical Therapy Treatment  Patient Details  Name: Robert Donovan MRN: [id number] Date of Birth: [birth year] Referring Provider (PT): Jonah Blue MD   Encounter Date: 06/07/2021    06/07/21 1234  PT Visits / Re-Eval  Visit Number 23  Number of Visits 29  Date for PT Re-Evaluation 06/21/21  Authorization  Authorization Type UHC MCD  Authorization Time Period 27 visit limit combined disciplines  Authorization - Visit Number 2 (started over in 2023)  Authorization - Number of Visits 27  Progress Note Due on Visit 29  PT Time Calculation  PT Start Time 1231  PT Stop Time 1314  PT Time Calculation (min) 43 min  PT - End of Session  Equipment Utilized During Treatment Gait belt  Activity Tolerance Patient tolerated treatment well  Behavior During Therapy [facility] for tasks assessed/performed    Past Medical History:  Diagnosis Date   Arthritis    Bilateral Knees   High cholesterol    History of loop recorder 05/16/2019   Hypertension    Stroke (HCC) 02/10/2018    Past Surgical History:  Procedure Laterality Date   LOOP RECORDER IMPLANT  05/16/2019   TEE WITHOUT CARDIOVERSION N/A 02/16/2018   Procedure: TRANSESOPHAGEAL ECHOCARDIOGRAM (TEE);  Surgeon: Jodelle Red, MD;  Location: [facility] ENDOSCOPY;  Service: Cardiovascular;  Laterality: N/A;   TOOTH EXTRACTION Bilateral 10/19/2020   Procedure: DENTAL RESTORATION/EXTRACTIONS;  Surgeon: Ocie Doyne, DMD;  Location: [facility] OR;  Service: Oral Surgery;  Laterality: Bilateral;    There were no vitals filed for this visit.    06/07/21 1234  Symptoms/Limitations  Subjective No new complaitns. No falls or pain to report.  Pertinent History 1. Hemiparesis affecting right side as late effect of cerebrovascular accident (CVA) (HCC)  2. Ankle fracture, bimalleolar, closed,  right, sequela  I think it is reasonable for him to continue with PCS services due to residual right-sided weakness from stroke and gait disturbance associated with the stroke and his ankle fracture.     3. Chronic right shoulder pain  We will get an x-ray of the shoulder to see whether he has developed some arthritis in the shoulder.  Further management will be based on results.  - DG Shoulder Right; Future     4. History of syphilis  -Patient requesting follow-up visit with infectious disease specialist as he was told.  - Ambulatory referral to Infectious Disease  Limitations Walking;Standing  How long can you sit comfortably? >15 min  How long can you stand comfortably? <5 min  How long can you walk comfortably? n/a  Pain Assessment  Currently in Pain? No/denies  Pain Score 0       06/07/21 1235  Transfers  Transfers Sit to Stand;Stand to Sit  Sit to Stand 5: Supervision;4: Min guard;4: Min assist  Stand to Sit 5: Supervision;4: Min guard  Number of Reps 2 sets;10 reps  Comments with mat at 20-21 inch height- no UE assist to stand, light UE support for controlled sitting for 10 reps; then with mat at ~24 inch height for sit<>stands with OH press with 2# weighted ball for 10 reps. min guard assist for safety.  Ambulation/Gait  Ambulation/Gait Yes  Ambulation/Gait Assistance 5: Supervision  Ambulation Distance (Feet)  (around clinic with session)  Assistive device Rolling walker  Gait Pattern Step-through pattern;Decreased step length - right;Narrow base of support;Decreased stance time - right;Right foot flat;Poor  foot clearance - right  Ambulation Surface Level;Indoor  Knee/Hip Exercises: Aerobic  Other Aerobic Scifit level 4.0 x 8 minutes with UE/LE's ~80-90 steps per minute for strengthening and activity tolerance.  seat 21 arms 7      06/07/21 1240  Balance Exercises: Standing  Standing Eyes Opened Narrow base of support (BOS);Head turns;Foam/compliant surface;Other reps  (comment);Limitations  Standing Eyes Opened Limitations on airex with feet apart with light touch to bars for head movements left<>right, then up<>down for 8-10 reps each, min guard assist for safety. cues on posture and weight shifting to assist with balance.  Standing Eyes Closed Foam/compliant surface;Wide (BOA);3 reps;30 secs;Limitations  Standing Eyes Closed Limitations on airex feet apart with light to no UE support (occasional touch to bars) for 30 seconds x 3 reps. min guard to min assist for balance.  Rockerboard Anterior/posterior;EO;EC;Other time (comment);Other reps (comment);Intermittent UE support;Limitations  Rockerboard Limitations with light UE uspport on bars- rocking the board with EO, progressing to EC with min guard to min assist for balance; then holding the board steady with EO letting go of bars for 30 sec's x 3 reps with min to mod assist for balance.          PT Short Term Goals - 05/15/21 0923       PT SHORT TERM GOAL #1   Title = LTGs               PT Long Term Goals - 05/15/21 0918       PT LONG TERM GOAL #1   Title Patient to report independence with HEP and compliance of completing >/= 3x/weekly    Baseline I with HEP; reports indpendence, not completing consistently    Time 4    Period Weeks    Status Revised    Target Date 06/21/21      PT LONG TERM GOAL #2   Title Patient will verbalize understanding of walking program and trying to walk >/= 20 minutes with intermittent rest breaks daily    Baseline not ambulating frequently    Time 4    Period Weeks    Status New      PT LONG TERM GOAL #3   Title Patient will perform TUG w/ RW in </= 30 seconds to demo improved balance and stability    Baseline 03/27/21: 36.68 sec's with RW as baseline 04/19/2021: 43.27 sec with RW scored today. 12/14: 34.78 secs    Time 4    Period Weeks    Status Revised      PT LONG TERM GOAL #4   Title Patient to ambulate >/= 400 ft without rest break and  supervision    Baseline 04/10/2021: pt ambulated 250 ft with RW under supervision/min guard; 345 ft with supervision/CGA    Time 4    Period Weeks    Status Revised      PT LONG TERM GOAL #5   Title Patient will improve Berg Balance to >/= 40/56 to demo improved balance and reduced fall risk    Baseline 04/19/2021: 32/56; 12/14: 37/56    Time 4    Period Weeks    Status Revised              06/07/21 1234  Plan  Clinical Impression Statement Today's skilled session continued to focus on strengthening and balance training with brief rest breaks taken as needed for fatigue, no other issues noted or reported in session. The pt is making progress and should benefit from  continued PT to progress toward unmet goals.  Personal Factors and Comorbidities Comorbidity 2  Comorbidities CVA, obesity  Examination-Activity Limitations Stairs;Locomotion Level;Transfers  Examination-Participation Restrictions Community Activity;Meal Prep  Pt will benefit from skilled therapeutic intervention in order to improve on the following deficits Decreased endurance;Obesity;Decreased activity tolerance;Decreased strength;Decreased balance;Decreased mobility;Difficulty walking;Abnormal gait;Decreased safety awareness  Stability/Clinical Decision Making Stable/Uncomplicated  Rehab Potential Good  PT Frequency 2x / week  PT Duration 8 weeks  PT Treatment/Interventions ADLs/Self Care Home Management;Aquatic Therapy;DME Instruction;Gait training;Stair training;Functional mobility training;Therapeutic activities;Therapeutic exercise;Balance training;Neuromuscular re-education;Patient/family education  PT Next Visit Plan Continue with sit<>stands w/o UE support from lower heights, static/dynamic balance in // bars, and LE strengthening/stretching, DF/PF strengthening, stepping tasks and core/sitting balance challenges (increased challenge with this). Standing/ walking endurance.  PT Home Exercise Plan [id number]   Consulted and Agree with Plan of Care Patient          Patient will benefit from skilled therapeutic intervention in order to improve the following deficits and impairments:  Decreased endurance, Obesity, Decreased activity tolerance, Decreased strength, Decreased balance, Decreased mobility, Difficulty walking, Abnormal gait, Decreased safety awareness  Visit Diagnosis: Unsteadiness on feet  Muscle weakness (generalized)  Other abnormalities of gait and mobility  Difficulty in walking, not elsewhere classified     Problem List Patient Active Problem List   Diagnosis Date Noted   Hemiparesis affecting right side as late effect of cerebrovascular accident (CVA) (HCC) 09/06/2020   Personal history of fall 09/06/2020   Chronic right shoulder pain 09/06/2020   Closed bimalleolar fracture of right ankle 07/27/2020   History of loop recorder 05/02/2020   Carotid artery disease (HCC) 05/02/2020   Right hemiparesis (HCC) 04/30/2020   Obesity (BMI 35.0-39.9 without comorbidity) 04/30/2020   Hyperlipidemia 04/26/2019   Hypokalemia    Acute embolic stroke (HCC)    History of CVA (cerebrovascular accident) 02/11/2018   Acute kidney injury superimposed on chronic kidney disease (HCC) 02/11/2018   History of syphilis 02/11/2018   Constipation 02/11/2018   Osteoarthritis 02/11/2018   Benign essential HTN 02/11/2018   Acute encephalopathy 02/10/2018    Sallyanne Kuster, PTA, [facility] Outpatient [facility] [address] [place], [place] [address] [phone] 06/09/21, 8:59 PM   Name: Robert Donovan MRN: [id number] Date of Birth: [birth year]

## 2021-06-10 DIAGNOSIS — G8193 Hemiplegia, unspecified affecting right nondominant side: Secondary | ICD-10-CM | POA: Diagnosis not present

## 2021-06-11 DIAGNOSIS — G8193 Hemiplegia, unspecified affecting right nondominant side: Secondary | ICD-10-CM | POA: Diagnosis not present

## 2021-06-11 NOTE — Progress Notes (Signed)
Carelink Summary Report / Loop Recorder 

## 2021-06-12 ENCOUNTER — Ambulatory Visit: Payer: Medicaid Other | Admitting: Physical Therapy

## 2021-06-12 ENCOUNTER — Encounter: Payer: Self-pay | Admitting: Physical Therapy

## 2021-06-12 ENCOUNTER — Other Ambulatory Visit: Payer: Self-pay

## 2021-06-12 DIAGNOSIS — R262 Difficulty in walking, not elsewhere classified: Secondary | ICD-10-CM

## 2021-06-12 DIAGNOSIS — R2689 Other abnormalities of gait and mobility: Secondary | ICD-10-CM

## 2021-06-12 DIAGNOSIS — R2681 Unsteadiness on feet: Secondary | ICD-10-CM

## 2021-06-12 DIAGNOSIS — M6281 Muscle weakness (generalized): Secondary | ICD-10-CM

## 2021-06-12 DIAGNOSIS — G8193 Hemiplegia, unspecified affecting right nondominant side: Secondary | ICD-10-CM | POA: Diagnosis not present

## 2021-06-12 NOTE — Therapy (Signed)
[facility] [address] [place], [place], [address] Phone: [phone]   Fax:  [phone]  Physical Therapy Treatment  Patient Details  Name: Robert Donovan MRN: [id number] Date of Birth: 19-Dec-1955 Referring Provider (PT): Karle Plumber MD   Encounter Date: 06/12/2021   PT End of Session - 06/12/21 1002     Visit Number 24    Number of Visits 29    Date for PT Re-Evaluation 06/21/21    Authorization Type U[employer] MCD    Authorization Time Period 27 visit limit combined disciplines    Authorization - Visit Number 3   started over in 2023   Authorization - Number of Visits 27    Progress Note Due on Visit 29    PT Start Time 1000   started pt early due to no patient prior to his session   PT Stop Time 1048    PT Time Calculation (min) 48 min    Equipment Utilized During Treatment Gait belt    Activity Tolerance Patient tolerated treatment well    Behavior During Therapy [facility] for tasks assessed/performed             Past Medical History:  Diagnosis Date   Arthritis    Bilateral Knees   High cholesterol    History of loop recorder 05/16/2019   Hypertension    Stroke ([place]) 02/10/2018    Past Surgical History:  Procedure Laterality Date   LOOP RECORDER IMPLANT  05/16/2019   TEE WITHOUT CARDIOVERSION N/A 02/16/2018   Procedure: TRANSESOPHAGEAL ECHOCARDIOGRAM (TEE);  Surgeon: Buford Dresser, MD;  Location: [facility] ENDOSCOPY;  Service: Cardiovascular;  Laterality: N/A;   TOOTH EXTRACTION Bilateral 10/19/2020   Procedure: DENTAL RESTORATION/EXTRACTIONS;  Surgeon: Diona Browner, DMD;  Location: [place];  Service: Oral Surgery;  Laterality: Bilateral;    There were no vitals filed for this visit.   Subjective Assessment - 06/12/21 1002     Subjective No new complaitns. No falls or pain to report.    Pertinent History 1. Hemiparesis affecting right side as late effect of cerebrovascular accident (CVA) ([place])  2.  Ankle fracture, bimalleolar, closed, right, sequela  I think it is reasonable for him to continue with PCS services due to residual right-sided weakness from stroke and gait disturbance associated with the stroke and his ankle fracture.     3. Chronic right shoulder pain  We will get an x-ray of the shoulder to see whether he has developed some arthritis in the shoulder.  Further management will be based on results.  - DG Shoulder Right; Future     4. History of syphilis  -Patient requesting follow-up visit with infectious disease specialist as he was told.  - Ambulatory referral to Infectious Disease    Limitations Walking;Standing    How long can you sit comfortably? >15 min    How long can you stand comfortably? <5 min    How long can you walk comfortably? n/a    Patient Stated Goals To improve my mobility    Currently in Pain? No/denies    Pain Score 0-No pain                     [facility] Adult PT Treatment/Exercise - 06/12/21 1003       Transfers   Transfers Sit to Stand;Stand to Sit    Sit to Stand 5: Supervision;With upper extremity assist;From bed;From chair/3-in-1    Stand to Sit 5: Supervision;With upper extremity assist;To bed;To  chair/3-in-1    Comments from low (20 inch) mat with hands on knees. did need 2 attempts to fully stand with 2-10 reps on 1st set; same with 2cd set. min guard to min assist as pt fatigued.      Ambulation/Gait   Ambulation/Gait Yes    Ambulation/Gait Assistance 5: Supervision    Ambulation Distance (Feet) 60 Feet   x1, 165 x1, 345 x1   Assistive device Rolling walker    Gait Pattern Step-through pattern;Decreased step length - right;Narrow base of support;Decreased stance time - right;Right foot flat;Poor foot clearance - right    Ambulation Surface Level;Indoor      Neuro Re-ed    Neuro Re-ed Details  for balance/strengthening: seated on green air disc at edge of mat with feet on floor- alternating UE raises, then upper trunk rotation for  ~5-6 reps each bil sides; then with light UE support on mat table- alternating long arc quads, then alternating marching with limited range noted on right side for 5-6 reps each. min guard assist for safety/balance.      Knee/Hip Exercises: Aerobic   Other Aerobic Scifit level 4.0 x 8 minutes with UE/LE's ~80-90 steps per minute for strengthening and activity tolerance.  seat 22 arms 9                       PT Short Term Goals - 05/15/21 0923       PT SHORT TERM GOAL #1   Title = LTGs               PT Long Term Goals - 05/15/21 0918       PT LONG TERM GOAL #1   Title Patient to report independence with HEP and compliance of completing >/= 3x/weekly    Baseline I with HEP; reports indpendence, not completing consistently    Time 4    Period Weeks    Status Revised    Target Date 06/21/21      PT LONG TERM GOAL #2   Title Patient will verbalize understanding of walking program and trying to walk >/= 20 minutes with intermittent rest breaks daily    Baseline not ambulating frequently    Time 4    Period Weeks    Status New      PT LONG TERM GOAL #3   Title Patient will perform TUG w/ RW in </= 30 seconds to demo improved balance and stability    Baseline 03/27/21: 36.68 sec's with RW as baseline 04/19/2021: 43.27 sec with RW scored today. 12/14: 34.78 secs    Time 4    Period Weeks    Status Revised      PT LONG TERM GOAL #4   Title Patient to ambulate >/= 400 ft without rest break and supervision    Baseline 04/10/2021: pt ambulated 250 ft with RW under supervision/min guard; 345 ft with supervision/CGA    Time 4    Period Weeks    Status Revised      PT LONG TERM GOAL #5   Title Patient will improve Berg Balance to >/= 40/56 to demo improved balance and reduced fall risk    Baseline 04/19/2021: 32/56; 12/14: 37/56    Time 4    Period Weeks    Status Revised                   Plan - 06/12/21 1003     Clinical Impression Statement  Today's skilled  session on continued to focus on strengthening, gait distance and balance with no issues noted or reported in session. The pt is making steady progress toward goals and should benefit from continued PT to progress toward unmet goals.    Personal Factors and Comorbidities Comorbidity 2    Comorbidities CVA, obesity    Examination-Activity Limitations Stairs;Locomotion Level;Transfers    Examination-Participation Restrictions Community Activity;Meal Prep    Stability/Clinical Decision Making Stable/Uncomplicated    Rehab Potential Good    PT Frequency 2x / week    PT Duration 8 weeks    PT Treatment/Interventions ADLs/Self Care Home Management;Aquatic Therapy;DME Instruction;Gait training;Stair training;Functional mobility training;Therapeutic activities;Therapeutic exercise;Balance training;Neuromuscular re-education;Patient/family education    PT Next Visit Plan Continue with sit<>stands w/o UE support from lower heights, static/dynamic balance in // bars, and LE strengthening/stretching, DF/PF strengthening, stepping tasks and core/sitting balance challenges (increased challenge with this). Standing/ walking endurance.    PT Home Exercise Plan [id number]    Consulted and Agree with Plan of Care Patient             Patient will benefit from skilled therapeutic intervention in order to improve the following deficits and impairments:  Decreased endurance, Obesity, Decreased activity tolerance, Decreased strength, Decreased balance, Decreased mobility, Difficulty walking, Abnormal gait, Decreased safety awareness  Visit Diagnosis: Unsteadiness on feet  Muscle weakness (generalized)  Other abnormalities of gait and mobility  Difficulty in walking, not elsewhere classified     Problem List Patient Active Problem List   Diagnosis Date Noted   Hemiparesis affecting right side as late effect of cerebrovascular accident (CVA) ([place]) 09/06/2020   Personal history of fall  09/06/2020   Chronic right shoulder pain 09/06/2020   Closed bimalleolar fracture of right ankle 07/27/2020   History of loop recorder 05/02/2020   Carotid artery disease ([place]) 05/02/2020   Right hemiparesis ([place]) 04/30/2020   Obesity (BMI 35.0-39.9 without comorbidity) 04/30/2020   Hyperlipidemia 04/26/2019   Hypokalemia    Acute embolic stroke ([place])    History of CVA (cerebrovascular accident) 02/11/2018   Acute kidney injury superimposed on chronic kidney disease ([place]) 02/11/2018   History of syphilis 02/11/2018   Constipation 02/11/2018   Osteoarthritis 02/11/2018   Benign essential HTN 02/11/2018   Acute encephalopathy 02/10/2018    Willow Ora, PTA, [facility] Outpatient Neuro [facility] [address], [place], [place] [address] [phone] 06/12/21, 1:54 PM   Name: Robert Donovan MRN: [id number] Date of Birth: [birth year]

## 2021-06-13 ENCOUNTER — Other Ambulatory Visit: Payer: Self-pay | Admitting: *Deleted

## 2021-06-13 DIAGNOSIS — G8193 Hemiplegia, unspecified affecting right nondominant side: Secondary | ICD-10-CM | POA: Diagnosis not present

## 2021-06-13 NOTE — Patient Instructions (Signed)
Visit Information  [name]  - as a part of your Medicaid benefit, you are eligible for care management and care coordination services at no cost or copay. I was unable to reach you by phone today but would be happy to help you with your health related needs. Please feel free to call me @ [phone].   A member of the Managed Medicaid care management team will reach out to you again over the next 14 days.   Estanislado Emms RN, BSN [place]   [employer]

## 2021-06-13 NOTE — Patient Outreach (Signed)
Care Coordination  06/13/2021  Robert Donovan 04-May-1956 [id number]   Medicaid Managed Care   Unsuccessful Outreach Note  06/13/2021 Name: Robert Donovan MRN: [id number] DOB: 12/03/1955  Referred by: Ladell Pier, MD Reason for referral : High Risk Managed Medicaid (Unsuccessful RNCM follow up outreach)   An unsuccessful telephone outreach was attempted today. The patient was referred to the case management team for assistance with care management and care coordination.   Follow Up Plan: A HIPAA compliant phone message was left for the patient providing contact information and requesting a return call.   Lurena Joiner RN, BSN [place] RN Care Coordinator

## 2021-06-14 ENCOUNTER — Ambulatory Visit: Payer: Medicaid Other | Admitting: Physical Therapy

## 2021-06-14 DIAGNOSIS — G8193 Hemiplegia, unspecified affecting right nondominant side: Secondary | ICD-10-CM | POA: Diagnosis not present

## 2021-06-17 DIAGNOSIS — G8193 Hemiplegia, unspecified affecting right nondominant side: Secondary | ICD-10-CM | POA: Diagnosis not present

## 2021-06-18 DIAGNOSIS — G8193 Hemiplegia, unspecified affecting right nondominant side: Secondary | ICD-10-CM | POA: Diagnosis not present

## 2021-06-19 ENCOUNTER — Encounter: Payer: Self-pay | Admitting: Physical Therapy

## 2021-06-19 ENCOUNTER — Other Ambulatory Visit: Payer: Self-pay

## 2021-06-19 ENCOUNTER — Ambulatory Visit: Payer: Medicaid Other | Admitting: Physical Therapy

## 2021-06-19 DIAGNOSIS — R2689 Other abnormalities of gait and mobility: Secondary | ICD-10-CM

## 2021-06-19 DIAGNOSIS — G8193 Hemiplegia, unspecified affecting right nondominant side: Secondary | ICD-10-CM | POA: Diagnosis not present

## 2021-06-19 DIAGNOSIS — R2681 Unsteadiness on feet: Secondary | ICD-10-CM

## 2021-06-19 DIAGNOSIS — M6281 Muscle weakness (generalized): Secondary | ICD-10-CM

## 2021-06-19 DIAGNOSIS — R262 Difficulty in walking, not elsewhere classified: Secondary | ICD-10-CM

## 2021-06-19 NOTE — Addendum Note (Signed)
Addended by: Baldomero Lamy B on: 06/19/2021 12:35 PM   Modules accepted: Orders

## 2021-06-19 NOTE — Therapy (Addendum)
[facility] [address] [place], [place], [address] Phone: [phone]   Fax:  [phone]  Physical Therapy Treatment  Patient Details  Name: Robert Donovan MRN: [id number] Date of Birth: 1956-05-22 Referring Provider (PT): Karle Plumber MD   Encounter Date: 06/19/2021   PT End of Session - 06/19/21 1047     Visit Number 25    Number of Visits 30    Date for PT Re-Evaluation 07/19/21    Authorization Type UHC MCD    Authorization Time Period 27 visit limit combined disciplines    Authorization - Visit Number 4   started over in 2023   Authorization - Number of Visits 27    Progress Note Due on Visit 29    PT Start Time 1045   started early   PT Stop Time 1130    PT Time Calculation (min) 45 min    Equipment Utilized During Treatment Gait belt    Activity Tolerance Patient tolerated treatment well    Behavior During Therapy [facility] for tasks assessed/performed             Past Medical History:  Diagnosis Date   Arthritis    Bilateral Knees   High cholesterol    History of loop recorder 05/16/2019   Hypertension    Stroke ([place]) 02/10/2018    Past Surgical History:  Procedure Laterality Date   LOOP RECORDER IMPLANT  05/16/2019   TEE WITHOUT CARDIOVERSION N/A 02/16/2018   Procedure: TRANSESOPHAGEAL ECHOCARDIOGRAM (TEE);  Surgeon: Buford Dresser, MD;  Location[facility] ENDOSCOPY;  Service: Cardiovascular;  Laterality: N/A;   TOOTH EXTRACTION Bilateral 10/19/2020   Procedure: DENTAL RESTORATION/EXTRACTIONS;  Surgeon: Diona Browner, DMD;  Location: [place];  Service: Oral Surgery;  Laterality: Bilateral;    There were no vitals filed for this visit.   Subjective Assessment - 06/19/21 1046     Subjective Was very sore and tired after last session, why he cancelled the one after it. Better now. No falls or pain to report.    Pertinent History 1. Hemiparesis affecting right side as late effect of  cerebrovascular accident (CVA) ([place])  2. Ankle fracture, bimalleolar, closed, right, sequela  I think it is reasonable for him to continue with PCS services due to residual right-sided weakness from stroke and gait disturbance associated with the stroke and his ankle fracture.     3. Chronic right shoulder pain  We will get an x-ray of the shoulder to see whether he has developed some arthritis in the shoulder.  Further management will be based on results.  - DG Shoulder Right; Future     4. History of syphilis  -Patient requesting follow-up visit with infectious disease specialist as he was told.  - Ambulatory referral to Infectious Disease    Limitations Walking;Standing    How long can you sit comfortably? >15 min    How long can you stand comfortably? <5 min    How long can you walk comfortably? n/a    Patient Stated Goals To improve my mobility    Currently in Pain? No/denies    Pain Score 0-No pain                [facility] PT Assessment - 06/19/21 1051       Standardized Balance Assessment   Standardized Balance Assessment Berg Balance Test;Timed Up and Go Test;10 meter walk test    10 Meter Walk 25.32 sec's= 1.30 ft/sec with RW  Berg Balance Test   Sit to Stand Able to stand without using hands and stabilize independently    Standing Unsupported Able to stand safely 2 minutes    Sitting with Back Unsupported but Feet Supported on Floor or Stool Able to sit safely and securely 2 minutes    Stand to Sit Sits safely with minimal use of hands    Transfers Able to transfer safely, minor use of hands    Standing Unsupported with Eyes Closed Able to stand 10 seconds safely    Standing Unsupported with Feet Together Able to place feet together independently and stand for 1 minute with supervision    From Standing, Reach Forward with Outstretched Arm Can reach confidently >25 cm (10")   10 inches   From Standing Position, Pick up Object from Floor Able to pick up shoe safely and easily     From Standing Position, Turn to Look Behind Over each Shoulder Looks behind one side only/other side shows less weight shift    Turn 360 Degrees Able to turn 360 degrees safely but slowly    Standing Unsupported, Alternately Place Feet on Step/Stool Able to complete >2 steps/needs minimal assist    Standing Unsupported, One Foot in Front Able to take small step independently and hold 30 seconds    Standing on One Leg Able to lift leg independently and hold equal to or more than 3 seconds    Total Score 45    Berg comment: 37-45 significant risk      Timed Up and Go Test   TUG Normal TUG    Normal TUG (seconds) 29.59   with RW                    [facility] Adult PT Treatment/Exercise - 06/19/21 1051       Transfers   Transfers Sit to Stand;Stand to Sit    Sit to Stand 5: Supervision;With upper extremity assist;From bed;From chair/3-in-1    Stand to Sit 5: Supervision;With upper extremity assist;To bed;To chair/3-in-1      Ambulation/Gait   Ambulation/Gait Yes    Ambulation/Gait Assistance 5: Supervision;4: Min guard    Ambulation/Gait Assistance Details occasional cues for walker position with gait    Ambulation Distance (Feet) 140 Feet   x1, plus around clinic with session   Assistive device Rolling walker    Gait Pattern Step-through pattern;Decreased step length - right;Narrow base of support;Decreased stance time - right;Right foot flat;Poor foot clearance - right    Ambulation Surface Level;Indoor      Self-Care   Self-Care Other Self-Care Comments    Other Self-Care Comments  Has been performing his ex's on  HEP. Reports they are still challenging; Does report walking every day. Walks more at home on non therapy days. Reports total time >15 minutes of walking each day, with rest breaks, not quite to the 20 minute goal "I think I may be just shy of that".               PT Short Term Goals - 05/15/21 0923       PT SHORT TERM GOAL #1   Title = LTGs                PT Long Term Goals - 06/19/21 1142       PT LONG TERM GOAL #1   Title Patient to report independence with HEP and compliance of completing >/= 3x/weekly    Baseline 06/19/21: met with current  HEP, will benefit from updating as he progresses    Status Achieved      PT LONG TERM GOAL #2   Title Patient will verbalize understanding of walking program and trying to walk >/= 20 minutes with intermittent rest breaks daily    Baseline 06/19/21: walking daily at home, not as much on days he does PT as he walks there. Does reports >/=15 minutes total of daily walking    Status Partially Met      PT LONG TERM GOAL #3   Title Patient will perform TUG w/ RW in </= 30 seconds to demo improved balance and stability    Baseline 06/19/21: 29.59 sec's with RW    Time --    Period --    Status Achieved      PT LONG TERM GOAL #4   Title Patient to ambulate >/= 400 ft without rest break and supervision    Baseline 06/19/21:  345 ft with supervision/CGA max distance to date, improved just not to goal level    Time --    Period --    Status Partially Met      PT LONG TERM GOAL #5   Title Patient will improve Berg Balance to >/= 40/56 to demo improved balance and reduced fall risk    Baseline 06/19/21: 45/56 scord today    Time --    Period --    Status Achieved            Updated Long Term Goals:   PT Long Term Goals - 06/19/21 1229       PT LONG TERM GOAL #1   Title Patient to report independence with progressive HEP and compliance of completing >/= 3x/weekly    Baseline 06/19/21: met with current HEP, will benefit from updating as he progresses    Time 4    Status Revised    Target Date 07/19/21      PT LONG TERM GOAL #2   Title Patient will improve gait speed with RW to >/= 1.5 ft/sec to demo improved mobility    Baseline 06/19/21: 1.30 ft/sec    Time 4    Period Weeks    Status New      PT LONG TERM GOAL #3   Title Patient will perform TUG w/ RW in </= 25 seconds to  demo improved balance and stability    Baseline 06/19/21: 29.59 sec's with RW    Time 4    Period Weeks    Status Revised      PT LONG TERM GOAL #4   Title Patient to ambulate >/= 400 ft without rest break and supervision    Baseline 06/19/21:  345 ft with supervision/CGA max distance to date, improved just not to goal level    Time 4    Period Weeks    Status On-going      PT LONG TERM GOAL #5   Title Patient will improve Berg Balance to >/= 48/56 to demo improved balance and reduced fall risk    Baseline 06/19/21: 45/56 scord today    Time 4    Period Weeks    Status Revised                   Plan - 06/19/21 1048     Clinical Impression Statement Today's skilled session focused on progress toward LTGs for anticipated recert. Pt improved Berg Balance test score to 45/56 and decreased Timed Up and Go to 29.59 sec's  with RW. Pt initial gait speed 1.30 ft/sec with RW. Primary PT to recert as pt should benefit from continued PT to progress mobility and balance.    Personal Factors and Comorbidities Comorbidity 2    Comorbidities CVA, obesity    Examination-Activity Limitations Stairs;Locomotion Level;Transfers    Examination-Participation Restrictions Community Activity;Meal Prep    Stability/Clinical Decision Making Stable/Uncomplicated    Rehab Potential Good    PT Frequency 1x / week    PT Duration 4 weeks   with new cert starting on 6/59/93   PT Treatment/Interventions ADLs/Self Care Home Management;Aquatic Therapy;DME Instruction;Gait training;Stair training;Functional mobility training;Therapeutic activities;Therapeutic exercise;Balance training;Neuromuscular re-education;Patient/family education    PT Next Visit Plan Continue with sit<>stands w/o UE support from lower heights, static/dynamic balance in // bars, and LE strengthening/stretching, DF/PF strengthening, stepping tasks and core/sitting balance challenges (increased challenge with this). Standing/ walking  endurance.    PT Home Exercise Plan [id number]    Consulted and Agree with Plan of Care Patient             Patient will benefit from skilled therapeutic intervention in order to improve the following deficits and impairments:  Decreased endurance, Obesity, Decreased activity tolerance, Decreased strength, Decreased balance, Decreased mobility, Difficulty walking, Abnormal gait, Decreased safety awareness  Visit Diagnosis: Unsteadiness on feet  Muscle weakness (generalized)  Other abnormalities of gait and mobility  Difficulty in walking, not elsewhere classified     Problem List Patient Active Problem List   Diagnosis Date Noted   Hemiparesis affecting right side as late effect of cerebrovascular accident (CVA) ([place]) 09/06/2020   Personal history of fall 09/06/2020   Chronic right shoulder pain 09/06/2020   Closed bimalleolar fracture of right ankle 07/27/2020   History of loop recorder 05/02/2020   Carotid artery disease ([place]) 05/02/2020   Right hemiparesis ([place]) 04/30/2020   Obesity (BMI 35.0-39.9 without comorbidity) 04/30/2020   Hyperlipidemia 04/26/2019   Hypokalemia    Acute embolic stroke ([place])    History of CVA (cerebrovascular accident) 02/11/2018   Acute kidney injury superimposed on chronic kidney disease ([place]) 02/11/2018   History of syphilis 02/11/2018   Constipation 02/11/2018   Osteoarthritis 02/11/2018   Benign essential HTN 02/11/2018   Acute encephalopathy 02/10/2018   Addend by PT: Charlotte Sanes, PT, DPT  Willow Ora, PTA, [facility] Outpatient Neuro [facility] [address] [place], [place] [address] [phone] 06/19/21, 11:54 AM   Name: Jamieson Hetland MRN: [id number] Date of Birth: 1956/05/01

## 2021-06-20 DIAGNOSIS — G8193 Hemiplegia, unspecified affecting right nondominant side: Secondary | ICD-10-CM | POA: Diagnosis not present

## 2021-06-21 ENCOUNTER — Encounter: Payer: Self-pay | Admitting: Physical Therapy

## 2021-06-21 ENCOUNTER — Other Ambulatory Visit: Payer: Self-pay

## 2021-06-21 ENCOUNTER — Ambulatory Visit: Payer: Medicaid Other | Admitting: Physical Therapy

## 2021-06-21 DIAGNOSIS — M6281 Muscle weakness (generalized): Secondary | ICD-10-CM

## 2021-06-21 DIAGNOSIS — R2681 Unsteadiness on feet: Secondary | ICD-10-CM

## 2021-06-21 DIAGNOSIS — R269 Unspecified abnormalities of gait and mobility: Secondary | ICD-10-CM

## 2021-06-21 DIAGNOSIS — R262 Difficulty in walking, not elsewhere classified: Secondary | ICD-10-CM

## 2021-06-21 DIAGNOSIS — R2689 Other abnormalities of gait and mobility: Secondary | ICD-10-CM

## 2021-06-21 NOTE — Therapy (Signed)
[facility] [address] [place], [place], [address] Phone: [phone]   Fax:  [phone]  Physical Therapy Treatment  Patient Details  Name: Robert Donovan MRN: [id number] Date of Birth: [birth year] Referring Provider (PT): Karle Plumber MD   Encounter Date: 06/21/2021   PT End of Session - 06/21/21 1234     Visit Number 26    Number of Visits 30    Date for PT Re-Evaluation 07/19/21    Authorization Type UHC MCD    Authorization Time Period 27 visit limit combined disciplines    Authorization - Visit Number 5   started over in 2023   Authorization - Number of Visits 27    Progress Note Due on Visit 29    PT Start Time 5883    PT Stop Time 1320    PT Time Calculation (min) 45 min    Equipment Utilized During Treatment Gait belt    Activity Tolerance Patient tolerated treatment well    Behavior During Therapy [facility] for tasks assessed/performed             Past Medical History:  Diagnosis Date   Arthritis    Bilateral Knees   High cholesterol    History of loop recorder 05/16/2019   Hypertension    Stroke ([place]) 02/10/2018    Past Surgical History:  Procedure Laterality Date   LOOP RECORDER IMPLANT  05/16/2019   TEE WITHOUT CARDIOVERSION N/A 02/16/2018   Procedure: TRANSESOPHAGEAL ECHOCARDIOGRAM (TEE);  Surgeon: Buford Dresser, MD;  Location: [facility] ENDOSCOPY;  Service: Cardiovascular;  Laterality: N/A;   TOOTH EXTRACTION Bilateral 10/19/2020   Procedure: DENTAL RESTORATION/EXTRACTIONS;  Surgeon: Diona Browner, DMD;  Location: [facility];  Service: Oral Surgery;  Laterality: Bilateral;    There were no vitals filed for this visit.   Subjective Assessment - 06/21/21 1234     Subjective No falls. Nothing new to report. Felt like last session was a good work out.    Pertinent History 1. Hemiparesis affecting right side as late effect of cerebrovascular accident (CVA) ([place])  2. Ankle fracture, bimalleolar,  closed, right, sequela  I think it is reasonable for him to continue with PCS services due to residual right-sided weakness from stroke and gait disturbance associated with the stroke and his ankle fracture.     3. Chronic right shoulder pain  We will get an x-ray of the shoulder to see whether he has developed some arthritis in the shoulder.  Further management will be based on results.  - DG Shoulder Right; Future     4. History of syphilis  -Patient requesting follow-up visit with infectious disease specialist as he was told.  - Ambulatory referral to Infectious Disease    Limitations Walking;Standing    How long can you sit comfortably? >15 min    How long can you stand comfortably? <5 min    How long can you walk comfortably? n/a    Patient Stated Goals To improve my mobility    Currently in Pain? No/denies                [facility] PT Assessment - 06/21/21 0001       Assessment   Medical Diagnosis CVA    Referring Provider (PT) Karle Plumber MD    Onset Date/Surgical Date 02/12/19                           [facility] Adult PT Treatment/Exercise -  Statement Treatment focused on increasing R LE weight bearing, balance, and strengthening.  Pt continues to make slow progress towards his long term goals.    Personal Factors and Comorbidities Comorbidity 2    Comorbidities CVA, obesity    Examination-Activity Limitations Stairs;Locomotion Level;Transfers    Examination-Participation Restrictions Community Activity;Meal Prep    Stability/Clinical Decision Making Stable/Uncomplicated    Rehab Potential Good    PT Frequency 1x / week    PT Duration 4 weeks   with new cert starting on 11/06/98   PT Treatment/Interventions ADLs/Self Care Home Management;Aquatic Therapy;DME Instruction;Gait training;Stair training;Functional mobility training;Therapeutic activities;Therapeutic exercise;Balance training;Neuromuscular re-education;Patient/family education    PT Next Visit Plan Continue with sit<>stands w/o UE support from lower heights, static/dynamic balance in // bars, and LE strengthening/stretching, DF/PF strengthening, stepping tasks and core/sitting balance challenges (increased challenge with this). Standing/ walking endurance.    PT Home Exercise Plan [id number]    Consulted and Agree with Plan of Care Patient             Patient will benefit from skilled therapeutic intervention in order to improve the following deficits and impairments:  Decreased endurance, Obesity, Decreased activity tolerance, Decreased strength, Decreased balance, Decreased mobility, Difficulty walking, Abnormal gait, Decreased safety awareness  Visit Diagnosis: Unsteadiness on feet  Muscle weakness (generalized)  Other abnormalities of gait and mobility  Difficulty in walking, not elsewhere classified  Gait disorder     Problem List Patient Active Problem List   Diagnosis Date Noted   Hemiparesis affecting right side as late effect of cerebrovascular accident (CVA) ([place]) 09/06/2020   Personal history of fall 09/06/2020   Chronic right shoulder pain 09/06/2020   Closed bimalleolar fracture of right ankle 07/27/2020   History of loop recorder  05/02/2020   Carotid artery disease ([place]) 05/02/2020   Right hemiparesis ([place]) 04/30/2020   Obesity (BMI 35.0-39.9 without comorbidity) 04/30/2020   Hyperlipidemia 04/26/2019   Hypokalemia    Acute embolic stroke ([place])    History of CVA (cerebrovascular accident) 02/11/2018   Acute kidney injury superimposed on chronic kidney disease ([place]) 02/11/2018   History of syphilis 02/11/2018   Constipation 02/11/2018   Osteoarthritis 02/11/2018   Benign essential HTN 02/11/2018   Acute encephalopathy 02/10/2018    Brianca Fortenberry April Gordy Levan, PT, DPT 06/21/2021, 2:12 PM  [facility] [address] [place], [place], [address] Phone: [phone]   Fax:  [phone]  Name: Robert Donovan MRN: [id number] Date of Birth: [birth year]  [facility] [address] [place], [place], [address] Phone: [phone]   Fax:  [phone]  Physical Therapy Treatment  Patient Details  Name: Robert Donovan MRN: [id number] Date of Birth: [birth year] Referring Provider (PT): Karle Plumber MD   Encounter Date: 06/21/2021   PT End of Session - 06/21/21 1234     Visit Number 26    Number of Visits 30    Date for PT Re-Evaluation 07/19/21    Authorization Type UHC MCD    Authorization Time Period 27 visit limit combined disciplines    Authorization - Visit Number 5   started over in 2023   Authorization - Number of Visits 27    Progress Note Due on Visit 29    PT Start Time 5883    PT Stop Time 1320    PT Time Calculation (min) 45 min    Equipment Utilized During Treatment Gait belt    Activity Tolerance Patient tolerated treatment well    Behavior During Therapy [facility] for tasks assessed/performed             Past Medical History:  Diagnosis Date   Arthritis    Bilateral Knees   High cholesterol    History of loop recorder 05/16/2019   Hypertension    Stroke ([place]) 02/10/2018    Past Surgical History:  Procedure Laterality Date   LOOP RECORDER IMPLANT  05/16/2019   TEE WITHOUT CARDIOVERSION N/A 02/16/2018   Procedure: TRANSESOPHAGEAL ECHOCARDIOGRAM (TEE);  Surgeon: Buford Dresser, MD;  Location: [facility] ENDOSCOPY;  Service: Cardiovascular;  Laterality: N/A;   TOOTH EXTRACTION Bilateral 10/19/2020   Procedure: DENTAL RESTORATION/EXTRACTIONS;  Surgeon: Diona Browner, DMD;  Location: [facility];  Service: Oral Surgery;  Laterality: Bilateral;    There were no vitals filed for this visit.   Subjective Assessment - 06/21/21 1234     Subjective No falls. Nothing new to report. Felt like last session was a good work out.    Pertinent History 1. Hemiparesis affecting right side as late effect of cerebrovascular accident (CVA) ([place])  2. Ankle fracture, bimalleolar,  closed, right, sequela  I think it is reasonable for him to continue with PCS services due to residual right-sided weakness from stroke and gait disturbance associated with the stroke and his ankle fracture.     3. Chronic right shoulder pain  We will get an x-ray of the shoulder to see whether he has developed some arthritis in the shoulder.  Further management will be based on results.  - DG Shoulder Right; Future     4. History of syphilis  -Patient requesting follow-up visit with infectious disease specialist as he was told.  - Ambulatory referral to Infectious Disease    Limitations Walking;Standing    How long can you sit comfortably? >15 min    How long can you stand comfortably? <5 min    How long can you walk comfortably? n/a    Patient Stated Goals To improve my mobility    Currently in Pain? No/denies                [facility] PT Assessment - 06/21/21 0001       Assessment   Medical Diagnosis CVA    Referring Provider (PT) Karle Plumber MD    Onset Date/Surgical Date 02/12/19                           [facility] Adult PT Treatment/Exercise -

## 2021-06-24 ENCOUNTER — Other Ambulatory Visit: Payer: Self-pay | Admitting: *Deleted

## 2021-06-24 ENCOUNTER — Other Ambulatory Visit: Payer: Self-pay

## 2021-06-24 DIAGNOSIS — G8193 Hemiplegia, unspecified affecting right nondominant side: Secondary | ICD-10-CM | POA: Diagnosis not present

## 2021-06-24 NOTE — Patient Outreach (Signed)
Medicaid Managed Care   Nurse Care Manager Note  06/24/2021 Name:  Robert Donovan MRN:  [id number] DOB:  September 27, 1955  Robert Donovan is an 66 y.o. year old male who is a primary patient of Robert Matar, MD.  The Medicaid Managed Care Coordination team was consulted for assistance with:    CAD  Robert Donovan was given information about Medicaid Managed Care Coordination team services today. Robert Donovan and Administrator, Civil Service (DPR) agreed to services and verbal consent obtained.  Engaged with patient by telephone for follow up visit in response to provider referral for case management and/or care coordination services.   Assessments/Interventions:  Review of past medical history, allergies, medications, health status, including review of consultants reports, laboratory and other test data, was performed as part of comprehensive evaluation and provision of chronic care management services.  SDOH (Social Determinants of Health) assessments and interventions performed:   Care Plan  Allergies  Allergen Reactions   Hydrocodone     Pt stated, "I am not allergic, I do not want to take this medicine ever" Codeine - "I do not want to ever take this medicine - not allergic"   Oxycodone     Pt stated, "I am not allergic, I do not want to take this medicine ever"    Medications Reviewed Today     Reviewed by Robert Donovan, PTA (Physical Therapy Assistant) on 06/19/21 at 1046  Med List Status: <None>   Medication Order Taking? Sig Documenting Provider Last Dose Status Informant  amLODipine (NORVASC) 5 MG tablet [id number] No Take 1 tablet (5 mg total) by mouth daily. Robert Matar, MD Taking Active   aspirin EC 81 MG tablet [id number] No Take 1 tablet (81 mg total) by mouth daily. Robert Matar, MD Taking Active   atorvastatin (LIPITOR) 40 MG tablet [id number] No Take 1 tablet (40 mg total) by mouth every evening. Robert Matar, MD Taking Active   calcium-vitamin D  (OSCAL WITH D) 500-200 MG-UNIT tablet [id number] No Take 1 tablet by mouth daily. [provider] Taking Active Self  lisinopril-hydrochlorothiazide (ZESTORETIC) 20-25 MG tablet [id number] No TAKE 2 TABLETS BY MOUTH DAILY FOR BLOOD PRESSURE Robert Matar, MD Taking Active   Misc. Devices MISC [id number] No Large blood pressure cuff device For home blood pressure monitoring Robert Matar, MD Taking Active   Multiple Vitamins-Minerals (MENS MULTIVITAMIN PLUS PO) [id number] No Take 1 tablet by mouth daily. [provider] Taking Active Self  naproxen (NAPROSYN) 250 MG tablet [id number] No Take 500 mg by mouth 2 (two) times daily with a meal. [provider] Taking Active Self  vitamin B-12 (CYANOCOBALAMIN) 500 MCG tablet [id number] No Take 1 tablet (500 mcg total) by mouth daily. Robert Matar, MD Taking Active             Patient Active Problem List   Diagnosis Date Noted   Hemiparesis affecting right side as late effect of cerebrovascular accident (CVA) ([facility]) 09/06/2020   Personal history of fall 09/06/2020   Chronic right shoulder pain 09/06/2020   Closed bimalleolar fracture of right ankle 07/27/2020   History of loop recorder 05/02/2020   Carotid artery disease ([facility]) 05/02/2020   Right hemiparesis ([facility]) 04/30/2020   Obesity (BMI 35.0-39.9 without comorbidity) 04/30/2020   Hyperlipidemia 04/26/2019   Hypokalemia    Acute embolic stroke [facility])    History of CVA (cerebrovascular accident) 02/11/2018   Acute kidney injury superimposed on chronic kidney disease ([facility]) 02/11/2018  History of syphilis 02/11/2018   Constipation 02/11/2018   Osteoarthritis 02/11/2018   Benign essential HTN 02/11/2018   Acute encephalopathy 02/10/2018    Conditions to be addressed/monitored per PCP order:  CAD  Care Plan : RN Care Manager Plan of Care  Updates made by Robert Dach, RN since 06/24/2021 12:00 AM     Problem: Chronic Heatlh Management Needs Related  to History of CVA      Long-Range Goal: Development of Plan of Care to address heatlh management needs related to history of CVA   Start Date: 05/14/2021  Expected End Date: 07/13/2021  Priority: High  Note:   Current Barriers:  Chronic Disease Management support and education needs related to CAD-[name]/DPR, Robert Donovan is concerned re: denial for electric scooter. She has not had a chance to call provided resources. Robert Donovan is attending Neuro PT.  RNCM Clinical Goal(s):  Patient will verbalize understanding of plan for management of CAD as evidenced by verbalization and self monitoring activities take all medications exactly as prescribed and will call provider for medication related questions as evidenced by documentation in EMR    attend all scheduled medical appointments: Neuro PT, and PCP on 07/15/21 as evidenced by provider documentation in EMR        continue to work with RN Care Manager and/or Social Worker to address care management and care coordination needs related to CAD as evidenced by adherence to CM Team Scheduled appointments     through collaboration with Medical illustrator, provider, and care team.   Interventions: Inter-disciplinary care team collaboration (see longitudinal plan of care) Evaluation of current treatment plan related to  self management and patient's adherence to plan as established by provider Provided information for [facility] Enrollment Broker [phone] and Medicaid Robert Donovan [phone] Advised to discuss questions or concerns with PCP Provided contact information to [facility] [phone], for scheduling an eye exam   CAD  (Status: Condition stable. Not addressed this visit.) Long Term Goal  Assessed understanding of CAD diagnosis Medications reviewed including medications utilized in CAD treatment plan Provided education on importance of blood pressure control in management of CAD; Provided education on Importance of limiting  foods high in cholesterol; Counseled on importance of regular laboratory monitoring as prescribed; Reviewed Importance of taking all medications as prescribed Reviewed Importance of attending all scheduled provider appointments Advised to report any changes in symptoms or exercise tolerance Assessed social determinant of health barriers;   Patient Goals/Self-Care Activities: Take medications as prescribed   Attend all scheduled provider appointments Call pharmacy for medication refills 3-7 days in advance of running out of medications Attend church or other social activities Call provider office for new concerns or questions        Follow Up:  Patient agrees to Care Plan and Follow-up.  Plan: The Managed Medicaid care management team will reach out to the patient again over the next 30 days.  Date/time of next scheduled RN care management/care coordination outreach:  07/24/21 @ 2:30pm  Estanislado Emms RN, BSN [place]   [facility] RN Care Coordinator

## 2021-06-24 NOTE — Patient Instructions (Addendum)
Visit Information  Robert Donovan was given information about Medicaid Managed Donovan Donovan Donovan coordination services as a part of their [place] Medicaid benefit. Robert Donovan verbally consented to engagement with the Robert Donovan.   If Robert Donovan are experiencing a medical emergency, please call 911 or report to your local emergency department or urgent Donovan.   If Robert Donovan have a non-emergency medical problem during routine business hours, please contact your provider's office and ask to speak with a nurse.   For questions related to your [facility], please call: [phone] or visit the homepage here: [email or web address]  If Robert Donovan would like to schedule transportation through your Robert Donovan, please call the following number at least 2 days in advance of your appointment: [phone].   Call the Robert Donovan at [phone], at any time, 24 hours a day, 7 days a week. If Robert Donovan are in danger or need immediate medical attention call 911.  If Robert Donovan would like help to quit smoking, call 1-800-QUIT-NOW 712-151-4645) OR Espaol: 1-855-Djelo-Ya QO:409462) o para ms informacin haga clic aqu or Text READY to 200-400 to register via text  Robert Donovan,   Please see education materials related to CAD provided by MyChart link.  The patient has access to MyChart and can view provided education  Telephone follow up appointment with Managed Medicaid Donovan management Donovan member scheduled for:07/24/21 @ 2:30pm  Robert Donovan Robert, BSN Robert Donovan Donovan Coordinator   Following is a copy of your plan of Donovan:  Donovan Plan : Robert Donovan  Updates made by Robert Donovan, Robert since 06/24/2021 12:00 AM     Problem: Chronic Heatlh Management Needs Related to History of CVA      Long-Range Goal: Development of Plan  of Donovan to address heatlh management needs related to history of CVA   Start Date: 05/14/2021  Expected End Date: 07/13/2021  Priority: High  Note:   Current Barriers:  Chronic Disease Management support and education needs related to CAD-[name]/DPR, Robert Donovan is concerned re: denial for Transport planner. She has not had a chance to call provided resources. Robert Donovan is attending Neuro PT.  RNCM Clinical Goal(s):  Patient will verbalize understanding of plan for management of CAD as evidenced by verbalization and self monitoring activities take all medications exactly as prescribed and will call provider for medication related questions as evidenced by documentation in EMR    attend all scheduled medical appointments: Neuro PT, and PCP on 07/15/21 as evidenced by provider documentation in EMR        continue to work with Robert Donovan Manager and/or Social Worker to address Donovan management and Donovan coordination needs related to CAD as evidenced by adherence to CM Donovan Scheduled appointments     through collaboration with Consulting civil engineer, provider, and Donovan Donovan.   Interventions: Inter-disciplinary Donovan Donovan collaboration (see longitudinal plan of Donovan) Evaluation of current treatment plan related to  self management and patient's adherence to plan as established by provider Provided information for [facility] Enrollment Broker [phone] and Medicaid [name] [phone] Advised to discuss questions or concerns with PCP Provided contact information to [facility] [phone], for scheduling an eye exam   CAD  (Status: Condition stable. Not addressed this visit.) Long Term Goal  Assessed understanding of CAD diagnosis Medications reviewed including medications utilized in CAD treatment plan Provided education on importance of blood  pressure control in management of CAD; Provided education on Importance of limiting foods high in cholesterol; Counseled on importance of regular  laboratory monitoring as prescribed; Reviewed Importance of taking all medications as prescribed Reviewed Importance of attending all scheduled provider appointments Advised to report any changes in symptoms or exercise tolerance Assessed social determinant of health barriers;   Patient Goals/Self-Donovan Activities: Take medications as prescribed   Attend all scheduled provider appointments Call pharmacy for medication refills 3-7 days in advance of running out of medications Attend church or other social activities Call provider office for new concerns or questions

## 2021-06-25 ENCOUNTER — Other Ambulatory Visit: Payer: Self-pay

## 2021-06-25 ENCOUNTER — Ambulatory Visit: Payer: Medicaid Other | Admitting: Physical Therapy

## 2021-06-25 ENCOUNTER — Encounter: Payer: Self-pay | Admitting: Physical Therapy

## 2021-06-25 DIAGNOSIS — R2681 Unsteadiness on feet: Secondary | ICD-10-CM

## 2021-06-25 DIAGNOSIS — M6281 Muscle weakness (generalized): Secondary | ICD-10-CM

## 2021-06-25 DIAGNOSIS — G8193 Hemiplegia, unspecified affecting right nondominant side: Secondary | ICD-10-CM | POA: Diagnosis not present

## 2021-06-25 DIAGNOSIS — R2689 Other abnormalities of gait and mobility: Secondary | ICD-10-CM

## 2021-06-25 DIAGNOSIS — R262 Difficulty in walking, not elsewhere classified: Secondary | ICD-10-CM

## 2021-06-25 NOTE — Therapy (Signed)
[facility] [address] [place], [place], [address] Phone: [phone]   Fax:  [phone]  Physical Therapy Treatment  Patient Details  Name: Robert Donovan MRN: [id number] Date of Birth: 1955-08-08 Referring Provider (PT): Karle Plumber MD   Encounter Date: 06/25/2021   PT End of Session - 06/25/21 0940     Visit Number 27    Number of Visits 30    Date for PT Re-Evaluation 07/19/21    Authorization Type [employer] MCD    Authorization Time Period 27 visit limit combined disciplines    Authorization - Visit Number 6   started over in 2023   Authorization - Number of Visits 27    Progress Note Due on Visit 29    PT Start Time 0936    PT Stop Time 1015    PT Time Calculation (min) 39 min    Equipment Utilized During Treatment Gait belt    Activity Tolerance Patient tolerated treatment well    Behavior During Therapy [facility] for tasks assessed/performed             Past Medical History:  Diagnosis Date   Arthritis    Bilateral Knees   High cholesterol    History of loop recorder 05/16/2019   Hypertension    Stroke ([place]) 02/10/2018    Past Surgical History:  Procedure Laterality Date   LOOP RECORDER IMPLANT  05/16/2019   TEE WITHOUT CARDIOVERSION N/A 02/16/2018   Procedure: TRANSESOPHAGEAL ECHOCARDIOGRAM (TEE);  Surgeon: Buford Dresser, MD;  [facility]: [facility] ENDOSCOPY;  Service: Cardiovascular;  Laterality: N/A;   TOOTH EXTRACTION Bilateral 10/19/2020   Procedure: DENTAL RESTORATION/EXTRACTIONS;  Surgeon: Diona Browner, DMD;  Location: [facility];  Service: Oral Surgery;  Laterality: Bilateral;    There were no vitals filed for this visit.   Subjective Assessment - 06/25/21 0939     Subjective No new complaints. No falls or pain to report.    Pertinent History 1. Hemiparesis affecting right side as late effect of cerebrovascular accident (CVA) ([place])  2. Ankle fracture, bimalleolar, closed, right, sequela  I  think it is reasonable for him to continue with PCS services due to residual right-sided weakness from stroke and gait disturbance associated with the stroke and his ankle fracture.     3. Chronic right shoulder pain  We will get an x-ray of the shoulder to see whether he has developed some arthritis in the shoulder.  Further management will be based on results.  - DG Shoulder Right; Future     4. History of syphilis  -Patient requesting follow-up visit with infectious disease specialist as he was told.  - Ambulatory referral to Infectious Disease    Limitations Walking;Standing    How long can you sit comfortably? >15 min    How long can you stand comfortably? <5 min    How long can you walk comfortably? n/a    Patient Stated Goals To improve my mobility    Currently in Pain? No/denies    Pain Score 0-No pain                   [facility] Adult PT Treatment/Exercise - 06/25/21 0941       Transfers   Transfers Sit to Stand;Stand to Sit    Sit to Stand 5: Supervision;With upper extremity assist;From bed;From chair/3-in-1    Stand to Sit 5: Supervision;With upper extremity assist;To bed;To chair/3-in-1      Ambulation/Gait   Ambulation/Gait Yes  Ambulation/Gait Assistance 5: Supervision    Ambulation/Gait Assistance Details working on obstacle negotiation while walking around track with pt negotiating around stools, computer desks and narrow spaces with supervision only.    Ambulation Distance (Feet) 230 Feet   x1   Assistive device Rolling walker    Gait Pattern Step-through pattern;Decreased step length - right;Narrow base of support;Decreased stance time - right;Right foot flat;Poor foot clearance - right    Ambulation Surface Level;Indoor      Neuro Re-ed    Neuro Re-ed Details  for balance/strengthening: standing with RW support for alternating forward, then cross foot taps to 4 inch box for 10 reps each/each way, min guard assist for safety; then seated on green air disc  alternating UE raises, then alternating upper trunk rotation to reach back behind patient. then with light UE support on mat table for alternating long arc quads, then alternaring marching. cues on ex form/technique needed.      Knee/Hip Exercises: Aerobic   Other Aerobic Scifit level 4.0 x 8 minutes with UE/LE's ~80-90 steps per minute for strengthening and activity tolerance.  seat at 26 to allow for stretching of bil LE's into full range of extension.                    PT Short Term Goals - 05/15/21 0923       PT SHORT TERM GOAL #1   Title = LTGs               PT Long Term Goals - 06/19/21 1229       PT LONG TERM GOAL #1   Title Patient to report independence with progressive HEP and compliance of completing >/= 3x/weekly    Baseline 06/19/21: met with current HEP, will benefit from updating as he progresses    Time 4    Status Revised    Target Date 07/19/21      PT LONG TERM GOAL #2   Title Patient will improve gait speed with RW to >/= 1.5 ft/sec to demo improved mobility    Baseline 06/19/21: 1.30 ft/sec    Time 4    Period Weeks    Status New      PT LONG TERM GOAL #3   Title Patient will perform TUG w/ RW in </= 25 seconds to demo improved balance and stability    Baseline 06/19/21: 29.59 sec's with RW    Time 4    Period Weeks    Status Revised      PT LONG TERM GOAL #4   Title Patient to ambulate >/= 400 ft without rest break and supervision    Baseline 06/19/21:  345 ft with supervision/CGA max distance to date, improved just not to goal level    Time 4    Period Weeks    Status On-going      PT LONG TERM GOAL #5   Title Patient will improve Berg Balance to >/= 48/56 to demo improved balance and reduced fall risk    Baseline 06/19/21: 45/56 scord today    Time 4    Period Weeks    Status Revised                   Plan - 06/25/21 0940     Clinical Impression Statement Today' s skilled session continued to focus on strengthening,  gait and balance wtih no issues noted or reported in session. The pt is making steady progress and should benefit  from continued PT to progress toward unmet goals.    Personal Factors and Comorbidities Comorbidity 2    Comorbidities CVA, obesity    Examination-Activity Limitations Stairs;Locomotion Level;Transfers    Examination-Participation Restrictions Community Activity;Meal Prep    Stability/Clinical Decision Making Stable/Uncomplicated    Rehab Potential Good    PT Frequency 1x / week    PT Duration 4 weeks   with new cert starting on 0/98/11   PT Treatment/Interventions ADLs/Self Care Home Management;Aquatic Therapy;DME Instruction;Gait training;Stair training;Functional mobility training;Therapeutic activities;Therapeutic exercise;Balance training;Neuromuscular re-education;Patient/family education    PT Next Visit Plan Continue with sit<>stands w/o UE support from lower heights, static/dynamic balance in // bars, and LE strengthening/stretching, DF/PF strengthening, stepping tasks and core/sitting balance challenges (increased challenge with this). Standing/ walking endurance.    PT Home Exercise Plan [id number]    Consulted and Agree with Plan of Care Patient             Patient will benefit from skilled therapeutic intervention in order to improve the following deficits and impairments:  Decreased endurance, Obesity, Decreased activity tolerance, Decreased strength, Decreased balance, Decreased mobility, Difficulty walking, Abnormal gait, Decreased safety awareness  Visit Diagnosis: Unsteadiness on feet  Muscle weakness (generalized)  Other abnormalities of gait and mobility  Difficulty in walking, not elsewhere classified     Problem List Patient Active Problem List   Diagnosis Date Noted   Hemiparesis affecting right side as late effect of cerebrovascular accident (CVA) ([place]) 09/06/2020   Personal history of fall 09/06/2020   Chronic right shoulder pain 09/06/2020    Closed bimalleolar fracture of right ankle 07/27/2020   History of loop recorder 05/02/2020   Carotid artery disease ([place]) 05/02/2020   Right hemiparesis ([place]) 04/30/2020   Obesity (BMI 35.0-39.9 without comorbidity) 04/30/2020   Hyperlipidemia 04/26/2019   Hypokalemia    Acute embolic stroke ([place])    History of CVA (cerebrovascular accident) 02/11/2018   Acute kidney injury superimposed on chronic kidney disease ([employer]) 02/11/2018   History of syphilis 02/11/2018   Constipation 02/11/2018   Osteoarthritis 02/11/2018   Benign essential HTN 02/11/2018   Acute encephalopathy 02/10/2018    Willow Ora, PTA, [facility] [address] [place], [place] [address] [phone] 06/25/21, 11:32 PM   Name: Robert Donovan MRN: [id number] Date of Birth: Oct 25, 1955

## 2021-06-26 DIAGNOSIS — G8193 Hemiplegia, unspecified affecting right nondominant side: Secondary | ICD-10-CM | POA: Diagnosis not present

## 2021-06-27 DIAGNOSIS — G8193 Hemiplegia, unspecified affecting right nondominant side: Secondary | ICD-10-CM | POA: Diagnosis not present

## 2021-06-28 DIAGNOSIS — G8193 Hemiplegia, unspecified affecting right nondominant side: Secondary | ICD-10-CM | POA: Diagnosis not present

## 2021-07-01 ENCOUNTER — Ambulatory Visit (INDEPENDENT_AMBULATORY_CARE_PROVIDER_SITE_OTHER): Payer: Medicaid Other

## 2021-07-01 DIAGNOSIS — I639 Cerebral infarction, unspecified: Secondary | ICD-10-CM | POA: Diagnosis not present

## 2021-07-01 DIAGNOSIS — G8193 Hemiplegia, unspecified affecting right nondominant side: Secondary | ICD-10-CM | POA: Diagnosis not present

## 2021-07-01 LAB — CUP PACEART REMOTE DEVICE CHECK
Date Time Interrogation Session: 20230129231442
Implantable Pulse Generator Implant Date: 20201214

## 2021-07-02 DIAGNOSIS — G8193 Hemiplegia, unspecified affecting right nondominant side: Secondary | ICD-10-CM | POA: Diagnosis not present

## 2021-07-03 ENCOUNTER — Other Ambulatory Visit: Payer: Self-pay

## 2021-07-03 ENCOUNTER — Ambulatory Visit: Payer: Medicaid Other | Attending: Internal Medicine | Admitting: Physical Therapy

## 2021-07-03 DIAGNOSIS — R262 Difficulty in walking, not elsewhere classified: Secondary | ICD-10-CM | POA: Insufficient documentation

## 2021-07-03 DIAGNOSIS — R2689 Other abnormalities of gait and mobility: Secondary | ICD-10-CM | POA: Diagnosis present

## 2021-07-03 DIAGNOSIS — R2681 Unsteadiness on feet: Secondary | ICD-10-CM | POA: Diagnosis present

## 2021-07-03 DIAGNOSIS — R269 Unspecified abnormalities of gait and mobility: Secondary | ICD-10-CM | POA: Diagnosis present

## 2021-07-03 DIAGNOSIS — M6281 Muscle weakness (generalized): Secondary | ICD-10-CM | POA: Diagnosis present

## 2021-07-03 NOTE — Therapy (Signed)
[facility] [address] [place], [place], [address] Phone: [phone]   Fax:  [phone]  Physical Therapy Treatment  Patient Details  Name: Robert Donovan MRN: [id number] Date of Birth: 1956/02/20 Referring Provider (PT): Karle Plumber MD   Encounter Date: 07/03/2021   PT End of Session - 07/03/21 1149     Visit Number 28    Number of Visits 30    Date for PT Re-Evaluation 07/19/21    Authorization Type [employer] MCD    Authorization Time Period 27 visit limit combined disciplines    Authorization - Visit Number 7   started over in 2023   Authorization - Number of Visits 27    Progress Note Due on Visit 29    PT Start Time 1149    PT Stop Time 1230    PT Time Calculation (min) 41 min    Equipment Utilized During Treatment Gait belt    Activity Tolerance Patient tolerated treatment well    Behavior During Therapy [facility] for tasks assessed/performed             Past Medical History:  Diagnosis Date   Arthritis    Bilateral Knees   High cholesterol    History of loop recorder 05/16/2019   Hypertension    Stroke ([place]) 02/10/2018    Past Surgical History:  Procedure Laterality Date   LOOP RECORDER IMPLANT  05/16/2019   TEE WITHOUT CARDIOVERSION N/A 02/16/2018   Procedure: TRANSESOPHAGEAL ECHOCARDIOGRAM (TEE);  Surgeon: Buford Dresser, MD;  Location: [facility] ENDOSCOPY;  Service: Cardiovascular;  Laterality: N/A;   TOOTH EXTRACTION Bilateral 10/19/2020   Procedure: DENTAL RESTORATION/EXTRACTIONS;  Surgeon: Diona Browner, DMD;  Location: [facility];  Service: Oral Surgery;  Laterality: Bilateral;    There were no vitals filed for this visit.   Subjective Assessment - 07/03/21 1152     Subjective Pt reports nothing new. No falls.    Pertinent History 1. Hemiparesis affecting right side as late effect of cerebrovascular accident (CVA) ([place])  2. Ankle fracture, bimalleolar, closed, right, sequela  I think it is  reasonable for him to continue with PCS services due to residual right-sided weakness from stroke and gait disturbance associated with the stroke and his ankle fracture.     3. Chronic right shoulder pain  We will get an x-ray of the shoulder to see whether he has developed some arthritis in the shoulder.  Further management will be based on results.  - DG Shoulder Right; Future     4. History of syphilis  -Patient requesting follow-up visit with infectious disease specialist as he was told.  - Ambulatory referral to Infectious Disease    Limitations Walking;Standing    How long can you sit comfortably? >15 min    How long can you stand comfortably? <5 min    How long can you walk comfortably? n/a    Patient Stated Goals To improve my mobility    Currently in Pain? No/denies                               [facility] Adult PT Treatment/Exercise - 07/03/21 0001       Ambulation/Gait   Ambulation/Gait Assistance 5: Supervision    Ambulation Distance (Feet) 615 Feet    Assistive device Rolling walker    Gait Pattern Step-through pattern;Decreased step length - right;Narrow base of support;Decreased stance time - right;Right foot flat;Poor foot clearance - right  Rehab Potential Good    PT Frequency 1x / week    PT Duration 4 weeks   with new cert starting on 6/33/35   PT Treatment/Interventions ADLs/Self Care Home Management;Aquatic Therapy;DME Instruction;Gait training;Stair training;Functional mobility training;Therapeutic activities;Therapeutic exercise;Balance training;Neuromuscular re-education;Patient/family education    PT Next Visit Plan Continue with sit<>stands w/o UE support from lower heights, static/dynamic balance in // bars, and LE strengthening/stretching, DF/PF strengthening, stepping tasks and core/sitting balance challenges (increased challenge with this). Standing/ walking endurance.    PT Home Exercise Plan [id number]    Consulted and Agree with Plan of Care Patient             Patient will benefit from skilled therapeutic intervention in order to improve the following deficits and impairments:  Decreased endurance, Obesity, Decreased activity tolerance, Decreased strength, Decreased balance, Decreased mobility, Difficulty walking, Abnormal gait, Decreased safety awareness  Visit Diagnosis: Unsteadiness on feet  Muscle weakness (generalized)  Other abnormalities of gait and mobility  Difficulty in walking, not elsewhere classified  Gait disorder     Problem List Patient Active Problem List   Diagnosis Date Noted   Hemiparesis affecting right side as late effect of cerebrovascular accident (CVA) ([place]) 09/06/2020   Personal history of fall 09/06/2020   Chronic right shoulder pain 09/06/2020   Closed bimalleolar fracture of right ankle 07/27/2020   History of loop recorder 05/02/2020   Carotid artery disease ([place]) 05/02/2020   Right hemiparesis ([place]) 04/30/2020   Obesity (BMI 35.0-39.9 without comorbidity) 04/30/2020   Hyperlipidemia 04/26/2019   Hypokalemia    Acute embolic stroke ([place])    History of CVA (cerebrovascular accident)  02/11/2018   Acute kidney injury superimposed on chronic kidney disease ([place]) 02/11/2018   History of syphilis 02/11/2018   Constipation 02/11/2018   Osteoarthritis 02/11/2018   Benign essential HTN 02/11/2018   Acute encephalopathy 02/10/2018    Manette Doto April Gordy Levan, PT, DPT 07/03/2021, 12:42 PM  [facility] [address] [place], [place], [address] Phone: [phone]   Fax:  [phone]  Name: Robert Donovan MRN: [id number] Date of Birth: [birth year]  [facility] [address] [place], [place], [address] Phone: [phone]   Fax:  [phone]  Physical Therapy Treatment  Patient Details  Name: Robert Donovan MRN: [id number] Date of Birth: 1956/02/20 Referring Provider (PT): Karle Plumber MD   Encounter Date: 07/03/2021   PT End of Session - 07/03/21 1149     Visit Number 28    Number of Visits 30    Date for PT Re-Evaluation 07/19/21    Authorization Type UHC MCD    Authorization Time Period 27 visit limit combined disciplines    Authorization - Visit Number 7   started over in 2023   Authorization - Number of Visits 27    Progress Note Due on Visit 29    PT Start Time 1149    PT Stop Time 1230    PT Time Calculation (min) 41 min    Equipment Utilized During Treatment Gait belt    Activity Tolerance Patient tolerated treatment well    Behavior During Therapy [facility] for tasks assessed/performed             Past Medical History:  Diagnosis Date   Arthritis    Bilateral Knees   High cholesterol    History of loop recorder 05/16/2019   Hypertension    Stroke ([place]) 02/10/2018    Past Surgical History:  Procedure Laterality Date   LOOP RECORDER IMPLANT  05/16/2019   TEE WITHOUT CARDIOVERSION N/A 02/16/2018   Procedure: TRANSESOPHAGEAL ECHOCARDIOGRAM (TEE);  Surgeon: Buford Dresser, MD;  Location: [facility] ENDOSCOPY;  Service: Cardiovascular;  Laterality: N/A;   TOOTH EXTRACTION Bilateral 10/19/2020   Procedure: DENTAL RESTORATION/EXTRACTIONS;  Surgeon: Diona Browner, DMD;  Location: [facility];  Service: Oral Surgery;  Laterality: Bilateral;    There were no vitals filed for this visit.   Subjective Assessment - 07/03/21 1152     Subjective Pt reports nothing new. No falls.    Pertinent History 1. Hemiparesis affecting right side as late effect of cerebrovascular accident (CVA) ([place])  2. Ankle fracture, bimalleolar, closed, right, sequela  I think it is  reasonable for him to continue with PCS services due to residual right-sided weakness from stroke and gait disturbance associated with the stroke and his ankle fracture.     3. Chronic right shoulder pain  We will get an x-ray of the shoulder to see whether he has developed some arthritis in the shoulder.  Further management will be based on results.  - DG Shoulder Right; Future     4. History of syphilis  -Patient requesting follow-up visit with infectious disease specialist as he was told.  - Ambulatory referral to Infectious Disease    Limitations Walking;Standing    How long can you sit comfortably? >15 min    How long can you stand comfortably? <5 min    How long can you walk comfortably? n/a    Patient Stated Goals To improve my mobility    Currently in Pain? No/denies                               [facility] Adult PT Treatment/Exercise - 07/03/21 0001       Ambulation/Gait   Ambulation/Gait Assistance 5: Supervision    Ambulation Distance (Feet) 615 Feet    Assistive device Rolling walker    Gait Pattern Step-through pattern;Decreased step length - right;Narrow base of support;Decreased stance time - right;Right foot flat;Poor foot clearance - right

## 2021-07-08 NOTE — Progress Notes (Signed)
Carelink Summary Report / Loop Recorder 

## 2021-07-10 ENCOUNTER — Ambulatory Visit: Payer: Medicaid Other | Admitting: Physical Therapy

## 2021-07-10 ENCOUNTER — Encounter: Payer: Self-pay | Admitting: Physical Therapy

## 2021-07-10 ENCOUNTER — Other Ambulatory Visit: Payer: Self-pay

## 2021-07-10 DIAGNOSIS — M6281 Muscle weakness (generalized): Secondary | ICD-10-CM

## 2021-07-10 DIAGNOSIS — R2681 Unsteadiness on feet: Secondary | ICD-10-CM | POA: Diagnosis not present

## 2021-07-10 DIAGNOSIS — R2689 Other abnormalities of gait and mobility: Secondary | ICD-10-CM

## 2021-07-10 DIAGNOSIS — R262 Difficulty in walking, not elsewhere classified: Secondary | ICD-10-CM

## 2021-07-10 NOTE — Therapy (Signed)
[place] [facility] [address] [place], [place], [address] Phone: [phone]   Fax:  [phone]  Physical Therapy Treatment  Patient Details  Name: Robert Donovan MRN: [id number] Date of Birth: 21-Jun-1955 Referring Provider (PT): Karle Plumber MD   Encounter Date: 07/10/2021   PT End of Session - 07/10/21 1019     Visit Number 29    Number of Visits 30    Date for PT Re-Evaluation 07/19/21    Authorization Type U[employer] MCD    Authorization Time Period 27 visit limit combined disciplines    Authorization - Visit Number 8   started over in 2023   Authorization - Number of Visits 27    Progress Note Due on Visit 29    PT Start Time 1018    PT Stop Time 1100    PT Time Calculation (min) 42 min    Equipment Utilized During Treatment Gait belt    Activity Tolerance Patient tolerated treatment well    Behavior During Therapy [facility] for tasks assessed/performed             Past Medical History:  Diagnosis Date   Arthritis    Bilateral Knees   High cholesterol    History of loop recorder 05/16/2019   Hypertension    Stroke ([place]) 02/10/2018    Past Surgical History:  Procedure Laterality Date   LOOP RECORDER IMPLANT  05/16/2019   TEE WITHOUT CARDIOVERSION N/A 02/16/2018   Procedure: TRANSESOPHAGEAL ECHOCARDIOGRAM (TEE);  Surgeon: Buford Dresser, MD;  Location: [facility] ENDOSCOPY;  Service: Cardiovascular;  Laterality: N/A;   TOOTH EXTRACTION Bilateral 10/19/2020   Procedure: DENTAL RESTORATION/EXTRACTIONS;  Surgeon: Diona Browner, DMD;  Location: [facility];  Service: Oral Surgery;  Laterality: Bilateral;    There were no vitals filed for this visit.   Subjective Assessment - 07/10/21 1019     Subjective No new complaints. No falls or pain to report.    Pertinent History 1. Hemiparesis affecting right side as late effect of cerebrovascular accident (CVA) ([place])  2. Ankle fracture, bimalleolar, closed, right, sequela  I  think it is reasonable for him to continue with PCS services due to residual right-sided weakness from stroke and gait disturbance associated with the stroke and his ankle fracture.     3. Chronic right shoulder pain  We will get an x-ray of the shoulder to see whether he has developed some arthritis in the shoulder.  Further management will be based on results.  - DG Shoulder Right; Future     4. History of syphilis  -Patient requesting follow-up visit with infectious disease specialist as he was told.  - Ambulatory referral to Infectious Disease    Limitations Walking;Standing    How long can you sit comfortably? >15 min    How long can you stand comfortably? <5 min    How long can you walk comfortably? n/a    Patient Stated Goals To improve my mobility    Currently in Pain? No/denies    Pain Score 0-No pain                      [facility] Adult PT Treatment/Exercise - 07/10/21 1019       Transfers   Transfers Sit to Stand;Stand to Sit    Sit to Stand 5: Supervision;With upper extremity assist;From bed;From chair/3-in-1    Stand to Sit 5: Supervision;With upper extremity assist;To bed;To chair/3-in-1      Ambulation/Gait   Ambulation/Gait Yes  Ambulation/Gait Assistance 5: Supervision    Ambulation/Gait Assistance Details no balance issues noted.    Ambulation Distance (Feet) 350 Feet   x1   Assistive device Rolling walker    Gait Pattern Step-through pattern;Decreased step length - right;Narrow base of support;Decreased stance time - right;Right foot flat;Poor foot clearance - right    Ambulation Surface Level;Indoor      Self-Care   Self-Care Other Self-Care Comments    Other Self-Care Comments  Discussed post PT community fitness options. Provided pt with number for closest [facility]. Pt is to call to inquire on his joining costs and if he would qualify for financial assistance (printed out the application as well). Pt also reported his mom had talked about a gym near them owned  by a pastor they know. He was going to follow up with his dad on this one. If niether of these work will need to problem solve a safe place for pt to walk in the community that does not require membership and a complete HEP to address strengthening, balance and flexibility that pt can self advance.      Knee/Hip Exercises: Aerobic   Other Aerobic Scifit level 4.5 x 8 minutes with UE/LE's ~80-90 steps per minute for strengthening and activity tolerance.  seat at 26 to allow for stretching of bil LE's into full range of extension.                       PT Short Term Goals - 05/15/21 0923       PT SHORT TERM GOAL #1   Title = LTGs               PT Long Term Goals - 07/03/21 1232       PT LONG TERM GOAL #1   Title Patient to report independence with progressive HEP and compliance of completing >/= 3x/weekly    Baseline 06/19/21: met with current HEP, will benefit from updating as he progresses    Time 4    Status Revised    Target Date 07/19/21      PT LONG TERM GOAL #2   Title Patient will improve gait speed with RW to >/= 1.5 ft/sec to demo improved mobility    Baseline 06/19/21: 1.30 ft/sec    Time 4    Period Weeks    Status New      PT LONG TERM GOAL #3   Title Patient will perform TUG w/ RW in </= 25 seconds to demo improved balance and stability    Baseline 06/19/21: 29.59 sec's with RW    Time 4    Period Weeks    Status Revised      PT LONG TERM GOAL #4   Title Patient to ambulate >/= 400 ft without rest break and supervision    Baseline 07/03/21: 615 ft SBA with RW    Time 4    Period Weeks    Status Achieved      PT LONG TERM GOAL #5   Title Patient will improve Berg Balance to >/= 48/56 to demo improved balance and reduced fall risk    Baseline 06/19/21: 45/56 scord today    Time 4    Period Weeks    Status Revised                   Plan - 07/10/21 1019     Clinical Impression Statement Today's skilled session continued to focus on  activity  tolerance, strengthening and gait with RW with no issues noted or reported in session. End of session focused on planning for community fitness options post disacharge from therapy. Pt to look in to [facility] and another gym his mom made mention of prior to next session.    Personal Factors and Comorbidities Comorbidity 2    Comorbidities CVA, obesity    Examination-Activity Limitations Stairs;Locomotion Level;Transfers    Examination-Participation Restrictions Community Activity;Meal Prep    Stability/Clinical Decision Making Stable/Uncomplicated    Rehab Potential Good    PT Frequency 1x / week    PT Duration 4 weeks   with new cert starting on 0/25/42   PT Treatment/Interventions ADLs/Self Care Home Management;Aquatic Therapy;DME Instruction;Gait training;Stair training;Functional mobility training;Therapeutic activities;Therapeutic exercise;Balance training;Neuromuscular re-education;Patient/family education    PT Next Visit Plan check goals for anticipated discharge and finialize HEP/post PT fitness plan    PT Home Exercise Plan [email or web address]    Consulted and Agree with Plan of Care Patient             Patient will benefit from skilled therapeutic intervention in order to improve the following deficits and impairments:  Decreased endurance, Obesity, Decreased activity tolerance, Decreased strength, Decreased balance, Decreased mobility, Difficulty walking, Abnormal gait, Decreased safety awareness  Visit Diagnosis: Unsteadiness on feet  Muscle weakness (generalized)  Other abnormalities of gait and mobility  Difficulty in walking, not elsewhere classified     Problem List Patient Active Problem List   Diagnosis Date Noted   Hemiparesis affecting right side as late effect of cerebrovascular accident (CVA) ([place]) 09/06/2020   Personal history of fall 09/06/2020   Chronic right shoulder pain 09/06/2020   Closed bimalleolar fracture of right ankle 07/27/2020   History of loop  recorder 05/02/2020   Carotid artery disease ([place]) 05/02/2020   Right hemiparesis ([place]) 04/30/2020   Obesity (BMI 35.0-39.9 without comorbidity) 04/30/2020   Hyperlipidemia 04/26/2019   Hypokalemia    Acute embolic stroke ([place])    History of CVA (cerebrovascular accident) 02/11/2018   Acute kidney injury superimposed on chronic kidney disease ([place]) 02/11/2018   History of syphilis 02/11/2018   Constipation 02/11/2018   Osteoarthritis 02/11/2018   Benign essential HTN 02/11/2018   Acute encephalopathy 02/10/2018   Willow Ora, PTA, [facility] Outpatient Neuro [facility] [address] [place], [place] [address] [phone] 07/10/21, 3:19 PM   Name: Ra Pfiester MRN: [id number] Date of Birth: November 12, 1955

## 2021-07-15 ENCOUNTER — Ambulatory Visit: Payer: Medicaid Other | Admitting: Internal Medicine

## 2021-07-17 ENCOUNTER — Ambulatory Visit: Payer: Medicaid Other | Admitting: Physical Therapy

## 2021-07-17 ENCOUNTER — Other Ambulatory Visit: Payer: Self-pay

## 2021-07-17 DIAGNOSIS — R262 Difficulty in walking, not elsewhere classified: Secondary | ICD-10-CM

## 2021-07-17 DIAGNOSIS — R2681 Unsteadiness on feet: Secondary | ICD-10-CM

## 2021-07-17 DIAGNOSIS — R269 Unspecified abnormalities of gait and mobility: Secondary | ICD-10-CM

## 2021-07-17 DIAGNOSIS — R2689 Other abnormalities of gait and mobility: Secondary | ICD-10-CM

## 2021-07-17 DIAGNOSIS — M6281 Muscle weakness (generalized): Secondary | ICD-10-CM

## 2021-07-17 NOTE — Therapy (Signed)
Problem List Patient Active Problem List   Diagnosis Date Noted   Hemiparesis affecting right side as late effect of cerebrovascular accident (CVA) ([place]) 09/06/2020   Personal history of fall 09/06/2020   Chronic right shoulder pain 09/06/2020   Closed bimalleolar fracture of right ankle 07/27/2020   History of loop recorder 05/02/2020   Carotid artery  disease ([place]) 05/02/2020   Right hemiparesis ([place]) 04/30/2020   Obesity (BMI 35.0-39.9 without comorbidity) 04/30/2020   Hyperlipidemia 04/26/2019   Hypokalemia    Acute embolic stroke [facility])    History of CVA (cerebrovascular accident) 02/11/2018   Acute kidney injury superimposed on chronic kidney disease ([place]) 02/11/2018   History of syphilis 02/11/2018   Constipation 02/11/2018   Osteoarthritis 02/11/2018   Benign essential HTN 02/11/2018   Acute encephalopathy 02/10/2018    Calven Gilkes April Ma L Olds, PT, DPT 07/17/2021, 10:24 AM  [place] [address] [place], [place], [address] Phone: [phone]   Fax:  [phone]  Name: Robert Donovan MRN: [id number] Date of Birth: [birth year]  Problem List Patient Active Problem List   Diagnosis Date Noted   Hemiparesis affecting right side as late effect of cerebrovascular accident (CVA) ([place]) 09/06/2020   Personal history of fall 09/06/2020   Chronic right shoulder pain 09/06/2020   Closed bimalleolar fracture of right ankle 07/27/2020   History of loop recorder 05/02/2020   Carotid artery  disease ([place]) 05/02/2020   Right hemiparesis ([place]) 04/30/2020   Obesity (BMI 35.0-39.9 without comorbidity) 04/30/2020   Hyperlipidemia 04/26/2019   Hypokalemia    Acute embolic stroke [facility])    History of CVA (cerebrovascular accident) 02/11/2018   Acute kidney injury superimposed on chronic kidney disease ([place]) 02/11/2018   History of syphilis 02/11/2018   Constipation 02/11/2018   Osteoarthritis 02/11/2018   Benign essential HTN 02/11/2018   Acute encephalopathy 02/10/2018    Calven Gilkes April Ma L Olds, PT, DPT 07/17/2021, 10:24 AM  [facility] [address] [place], [place], [address] Phone: [phone]   Fax:  [phone]  Name: Robert Donovan MRN: [id number] Date of Birth: [birth year]  [place] [address] [place], [place], [address] Phone: [phone]   Fax:  [phone]  Physical Therapy Treatment and Discharge  Patient Details  Name: Robert Donovan MRN: [id number] Date of Birth: 04/03/1956 Referring Provider (PT): Karle Plumber MD  PHYSICAL THERAPY DISCHARGE SUMMARY  Visits from Start of Care: 30  Current functional level related to goals / functional outcomes: See below   Remaining deficits: Continued weakness and decreased ability to weight shift. Slower gait speed. Moderate risk of falls.   Education / Equipment: Discussed transitioning to [employer] program at [employer]. Discussed how to join -- needs to go in person and provide them his medicaid card.  Encouraged pt to walk daily at least 10 - 20 min.   Patient agrees to discharge. Patient goals were partially met. Patient is being discharged due to maximized rehab potential.    Encounter Date: 07/17/2021   PT End of Session - 07/17/21 0904     Visit Number 30    Number of Visits 30    Date for PT Re-Evaluation 07/19/21    Authorization Type UHC MCD    Authorization Time Period 27 visit limit combined disciplines    Authorization - Visit Number 9   started over in 2023   Authorization - Number of Visits 28    Progress Note Due on Visit 77    PT Start Time 0920    PT Stop Time 1008    PT Time Calculation (min) 48 min    Equipment Utilized During Treatment Gait belt    Activity Tolerance Patient tolerated treatment well    Behavior During Therapy WFL for tasks assessed/performed             Past Medical History:  Diagnosis Date   Arthritis    Bilateral Knees   High cholesterol    History of loop recorder 05/16/2019   Hypertension    Stroke ([place]) 02/10/2018    Past Surgical History:  Procedure Laterality Date   LOOP RECORDER IMPLANT  05/16/2019   TEE WITHOUT CARDIOVERSION N/A 02/16/2018   Procedure:  TRANSESOPHAGEAL ECHOCARDIOGRAM (TEE);  Surgeon: Buford Dresser, MD;  Location: [facility] ENDOSCOPY;  Service: Cardiovascular;  Laterality: N/A;   TOOTH EXTRACTION Bilateral 10/19/2020   Procedure: DENTAL RESTORATION/EXTRACTIONS;  Surgeon: Diona Browner, DMD;  Location: [facility];  Service: Oral Surgery;  Laterality: Bilateral;    There were no vitals filed for this visit.   Subjective Assessment - 07/17/21 0916     Subjective Pt followed up with his father about a [employer] near [facility]. Pt states his family has connections with a [employer].    Pertinent History 1. Hemiparesis affecting right side as late effect of cerebrovascular accident (CVA) ([place])  2. Ankle fracture, bimalleolar, closed, right, sequela  I think it is reasonable for him to continue with PCS services due to residual right-sided weakness from stroke and gait disturbance associated with the stroke and his ankle fracture.     3. Chronic right shoulder pain  We will get an x-ray of the shoulder to see whether he has developed some arthritis in the shoulder.  Further management will be based on results.  - DG Shoulder Right; Future     4. History of syphilis  -Patient requesting follow-up visit with infectious disease specialist as he was told.  - Ambulatory referral to Infectious Disease    Limitations Walking;Standing    How long can you sit comfortably? >15 min  [place] [address] [place], [place], [address] Phone: [phone]   Fax:  [phone]  Physical Therapy Treatment and Discharge  Patient Details  Name: Robert Donovan MRN: [id number] Date of Birth: 04/03/1956 Referring Provider (PT): Karle Plumber MD  PHYSICAL THERAPY DISCHARGE SUMMARY  Visits from Start of Care: 30  Current functional level related to goals / functional outcomes: See below   Remaining deficits: Continued weakness and decreased ability to weight shift. Slower gait speed. Moderate risk of falls.   Education / Equipment: Discussed transitioning to [employer] program at [employer]. Discussed how to join -- needs to go in person and provide them his medicaid card.  Encouraged pt to walk daily at least 10 - 20 min.   Patient agrees to discharge. Patient goals were partially met. Patient is being discharged due to maximized rehab potential.    Encounter Date: 07/17/2021   PT End of Session - 07/17/21 0904     Visit Number 30    Number of Visits 30    Date for PT Re-Evaluation 07/19/21    Authorization Type UHC MCD    Authorization Time Period 27 visit limit combined disciplines    Authorization - Visit Number 9   started over in 2023   Authorization - Number of Visits 28    Progress Note Due on Visit 77    PT Start Time 0920    PT Stop Time 1008    PT Time Calculation (min) 48 min    Equipment Utilized During Treatment Gait belt    Activity Tolerance Patient tolerated treatment well    Behavior During Therapy WFL for tasks assessed/performed             Past Medical History:  Diagnosis Date   Arthritis    Bilateral Knees   High cholesterol    History of loop recorder 05/16/2019   Hypertension    Stroke ([place]) 02/10/2018    Past Surgical History:  Procedure Laterality Date   LOOP RECORDER IMPLANT  05/16/2019   TEE WITHOUT CARDIOVERSION N/A 02/16/2018   Procedure:  TRANSESOPHAGEAL ECHOCARDIOGRAM (TEE);  Surgeon: Buford Dresser, MD;  Location: [facility] ENDOSCOPY;  Service: Cardiovascular;  Laterality: N/A;   TOOTH EXTRACTION Bilateral 10/19/2020   Procedure: DENTAL RESTORATION/EXTRACTIONS;  Surgeon: Diona Browner, DMD;  Location: [facility];  Service: Oral Surgery;  Laterality: Bilateral;    There were no vitals filed for this visit.   Subjective Assessment - 07/17/21 0916     Subjective Pt followed up with his father about a [employer] near [facility]. Pt states his family has connections with a [employer].    Pertinent History 1. Hemiparesis affecting right side as late effect of cerebrovascular accident (CVA) ([place])  2. Ankle fracture, bimalleolar, closed, right, sequela  I think it is reasonable for him to continue with PCS services due to residual right-sided weakness from stroke and gait disturbance associated with the stroke and his ankle fracture.     3. Chronic right shoulder pain  We will get an x-ray of the shoulder to see whether he has developed some arthritis in the shoulder.  Further management will be based on results.  - DG Shoulder Right; Future     4. History of syphilis  -Patient requesting follow-up visit with infectious disease specialist as he was told.  - Ambulatory referral to Infectious Disease    Limitations Walking;Standing    How long can you sit comfortably? >15 min

## 2021-07-17 NOTE — Patient Instructions (Signed)
Access Code: [email or web address] URL: [email or web address] Date: 07/17/2021 Prepared by: Vernon Prey [name]  Program Notes Walk 10 minutes 2 times during the day!!!!! Take rest breaks as needed.    Exercises Supine Bridge - 1 x daily - 3 x weekly - 2 sets - 10 reps Sit to Stand with Hands on Knees - 1 x daily - 3 x weekly - 2 sets - 5 reps Seated Knee Extension with Resistance - 1 x daily - 3 x weekly - 2 sets - 10 reps Seated Heel Toe Raises - 1 x daily - 3 x weekly - 1 sets - 10 reps Seated Quad Set - 1 x daily - 3 x weekly - 1 sets - 10 reps Side Stepping with Resistance at Ankles and Counter Support - 1 x daily - 3 x weekly - 2 sets - 10 reps Forward and Backward Monster Walk with Resistance at Ankles and Counter Support - 1 x daily - 3 x weekly - 2 sets - 10 reps Standing March with Counter Support - 1 x daily - 3 x weekly - 2 sets - 10 reps

## 2021-07-19 ENCOUNTER — Telehealth: Payer: Self-pay

## 2021-07-19 NOTE — Telephone Encounter (Signed)
Copied from CRM [id number]. Topic: General - Other >> Jul 17, 2021  3:01 PM Traci Sermon wrote: Reason for CRM: Nia called in from an [employer] [phone]  with the pt and the pts mother on the line, stating to get a PA for the pt for a  Power Wheel Chair and requested a call back at [phone] -- [phone], please advise.

## 2021-07-19 NOTE — Telephone Encounter (Signed)
Returned call to get more information in regards to this message. Spoke to Ms. Reginold Agent pt mother. Per pt mother I will need to call Nia   Returned Nia call and made aware that we have not received any paperwork in regards to wheelchair and pt will need to have a follow up appt with provider because we have not seen him since 12/2020.  Per Nia that does explain why they don't have any notes on the patient. Per Nia when she reaches back out to pt she will make him aware that he will need an appt.   Nia doesn't have any other questions or concerns

## 2021-07-24 ENCOUNTER — Other Ambulatory Visit: Payer: Self-pay | Admitting: *Deleted

## 2021-07-24 ENCOUNTER — Other Ambulatory Visit: Payer: Self-pay

## 2021-07-24 ENCOUNTER — Other Ambulatory Visit: Payer: Self-pay | Admitting: Internal Medicine

## 2021-07-24 DIAGNOSIS — I129 Hypertensive chronic kidney disease with stage 1 through stage 4 chronic kidney disease, or unspecified chronic kidney disease: Secondary | ICD-10-CM

## 2021-07-24 NOTE — Patient Instructions (Signed)
Visit Information  [name]'s mother was given information about Medicaid Managed Care team care coordination services as a part of their [facility] Community Plan Medicaid benefit. Robert Donovan mother verbally consented to engagement with the [facility] Managed Care team.   If you are experiencing a medical emergency, please call 911 or report to your local emergency department or urgent care.   If you have a non-emergency medical problem during routine business hours, please contact your provider's office and ask to speak with a nurse.   For questions related to your [facility], please call: [phone] or visit the homepage here: [email or web address]  If you would like to schedule transportation through your [facility], please call the following number at least 2 days in advance of your appointment: [phone].  Rides for urgent appointments can also be made after hours by calling Member Services.  Call the Behavioral Health Crisis Line at [phone], at any time, 24 hours a day, 7 days a week. If you are in danger or need immediate medical attention call 911.  If you would like help to quit smoking, call 1-800-QUIT-NOW (6614709279) OR Espaol: 1-855-Djelo-Ya (5-361-443-1540) o para ms informacin haga clic aqu or Text READY to 086-761 to register via text  Robert Donovan,   Please see education materials related to fall prevention provided by MyChart link.  Patient verbalizes understanding of instructions and care plan provided today and agrees to view in MyChart. Active MyChart status confirmed with patient.    Telephone follow up appointment with Managed Medicaid care management team member scheduled for:08/26/21 @ 3:30pm  Estanislado Emms RN, BSN [place]   [facility] RN Care Coordinator   Following is a copy of your plan of care:  Care Plan : RN  Care Manager Plan of Care  Updates made by [name], RN since 07/24/2021 12:00 AM     Problem: Chronic Heatlh Management Needs Related to History of CVA      Long-Range Goal: Development of Plan of Care to address heatlh management needs related to history of CVA   Start Date: 05/14/2021  Expected End Date: 08/30/2021  Priority: High  Note:   Current Barriers:  Chronic Disease Management support and education needs related to CAD-[name]/DPR, Robert Donovan is concerned re: denial for electric wheelchair. She is working with the [employer]. She has not had the opportunity to schedule an eye exam. Robert Donovan has completed PT and will transition to a program at the [facility].  RNCM Clinical Goal(s):  Patient will verbalize understanding of plan for management of CAD as evidenced by verbalization and self monitoring activities take all medications exactly as prescribed and will call provider for medication related questions as evidenced by documentation in EMR    attend all scheduled medical appointments: PCP on 3/20 and TFC on 08/28/21 as evidenced by provider documentation in EMR        continue to work with RN Care Manager and/or Social Worker to address care management and care coordination needs related to CAD as evidenced by adherence to CM Team Scheduled appointments     through collaboration with Medical illustrator, provider, and care team.   Interventions: Inter-disciplinary care team collaboration (see longitudinal plan of care) Evaluation of current treatment plan related to  self management and patient's adherence to plan as established by provider Advised to visit MyChart for physician documentation supporting the need for Power Wheelchair, specifically notes on 08/28/20 and 09/06/20 Advised  to discuss questions or concerns with PCP Provided contact information to [facility] [phone], for scheduling an eye exam   CAD  (Status: Goal on Track (progressing): YES.) Long  Term Goal  Assessed understanding of CAD diagnosis Medications reviewed including medications utilized in CAD treatment plan Provided education on importance of blood pressure control in management of CAD; Counseled on the importance of exercise goals with target of 150 minutes per week Reviewed Importance of taking all medications as prescribed Reviewed Importance of attending all scheduled provider appointments Advised to report any changes in symptoms or exercise tolerance Assessed social determinant of health barriers;   Patient Goals/Self-Care Activities: Take medications as prescribed   Attend all scheduled provider appointments Call pharmacy for medication refills 3-7 days in advance of running out of medications Attend church or other social activities Call provider office for new concerns or questions

## 2021-07-24 NOTE — Patient Outreach (Signed)
Medicaid Managed Care   Nurse Care Manager Note  07/24/2021 Name:  Robert Donovan MRN:  [id number] DOB:  1955/07/22  Robert Donovan is an 66 y.o. year old male who is a primary patient of Robert Matar, MD.  The Medicaid Managed Care Coordination team was consulted for assistance with:    CAD  Mr. [name] was given information about Medicaid Managed Care Coordination team services today. [name] agreed to services and verbal consent obtained.  Engaged with patient by telephone for follow up visit in response to provider referral for case management and/or care coordination services.   Assessments/Interventions:  Review of past medical history, allergies, medications, health status, including review of consultants reports, laboratory and other test data, was performed as part of comprehensive evaluation and provision of chronic care management services.  SDOH (Social Determinants of Health) assessments and interventions performed: SDOH Interventions    Flowsheet Row Most Recent Value  SDOH Interventions   Food Insecurity Interventions Intervention Not Indicated  Housing Interventions Intervention Not Indicated       Care Plan  Allergies  Allergen Reactions   Hydrocodone     Pt stated, "I am not allergic, I do not want to take this medicine ever" Codeine - "I do not want to ever take this medicine - not allergic"   Oxycodone     Pt stated, "I am not allergic, I do not want to take this medicine ever"    Medications Reviewed Today     Reviewed by [name], RN (Registered Nurse) on 07/24/21 at 1522  Med List Status: <None>   Medication Order Taking? Sig Documenting Provider Last Dose Status Informant  amLODipine (NORVASC) 5 MG tablet [id number] Yes Take 1 tablet (5 mg total) by mouth daily. Robert Matar, MD Taking Active   aspirin EC 81 MG tablet [id number] Yes Take 1 tablet (81 mg total) by mouth daily. Robert Matar, MD Taking Active    atorvastatin (LIPITOR) 40 MG tablet [id number] Yes Take 1 tablet (40 mg total) by mouth every evening. Robert Matar, MD Taking Active   calcium-vitamin D [name] WITH D) 500-200 MG-UNIT tablet [id number] Yes Take 1 tablet by mouth daily. [provider] Taking Active Self  lisinopril-hydrochlorothiazide (ZESTORETIC) 20-25 MG tablet [id number] Yes TAKE 2 TABLETS BY MOUTH DAILY FOR BLOOD PRESSURE Robert Matar, MD Taking Active   Misc. Devices MISC [id number] Yes Large blood pressure cuff device For home blood pressure monitoring Robert Matar, MD Taking Active   Multiple Vitamins-Minerals (MENS MULTIVITAMIN PLUS PO) [id number] Yes Take 1 tablet by mouth daily. [provider] Taking Active Self  naproxen (NAPROSYN) 250 MG tablet [id number] Yes Take 500 mg by mouth 2 (two) times daily with a meal. [provider] Taking Active Self  vitamin B-12 (CYANOCOBALAMIN) 500 MCG tablet [id number] Yes Take 1 tablet (500 mcg total) by mouth daily. Robert Matar, MD Taking Active             Patient Active Problem List   Diagnosis Date Noted   Hemiparesis affecting right side as late effect of cerebrovascular accident (CVA) (HCC) 09/06/2020   Personal history of fall 09/06/2020   Chronic right shoulder pain 09/06/2020   Closed bimalleolar fracture of right ankle 07/27/2020   History of loop recorder 05/02/2020   Carotid artery disease (HCC) 05/02/2020   Right hemiparesis (HCC) 04/30/2020   Obesity (BMI 35.0-39.9 without comorbidity) 04/30/2020   Hyperlipidemia 04/26/2019   Hypokalemia    Acute  embolic stroke (HCC)    History of CVA (cerebrovascular accident) 02/11/2018   Acute kidney injury superimposed on chronic kidney disease (HCC) 02/11/2018   History of syphilis 02/11/2018   Constipation 02/11/2018   Osteoarthritis 02/11/2018   Benign essential HTN 02/11/2018   Acute encephalopathy 02/10/2018    Conditions to be addressed/monitored per PCP order:   CAD  Care Plan : RN Care Manager Plan of Care  Updates made by [name], RN since 07/24/2021 12:00 AM     Problem: Chronic Heatlh Management Needs Related to History of CVA      Long-Range Goal: Development of Plan of Care to address heatlh management needs related to history of CVA   Start Date: 05/14/2021  Expected End Date: 08/30/2021  Priority: High  Note:   Current Barriers:  Chronic Disease Management support and education needs related to CAD-[name]/DPR, Julieanne Cotton is concerned re: denial for electric wheelchair. She is working with the [employer]. She has not had the opportunity to schedule an eye exam. Mr. Donovan has completed PT and will transition to a program at the [facility].  RNCM Clinical Goal(s):  Patient will verbalize understanding of plan for management of CAD as evidenced by verbalization and self monitoring activities take all medications exactly as prescribed and will call provider for medication related questions as evidenced by documentation in EMR    attend all scheduled medical appointments: PCP on 3/20 and TFC on 08/28/21 as evidenced by provider documentation in EMR        continue to work with RN Care Manager and/or Social Worker to address care management and care coordination needs related to CAD as evidenced by adherence to CM Team Scheduled appointments     through collaboration with Medical illustrator, provider, and care team.   Interventions: Inter-disciplinary care team collaboration (see longitudinal plan of care) Evaluation of current treatment plan related to  self management and patient's adherence to plan as established by provider Advised to visit MyChart for physician documentation supporting the need for Power Wheelchair, specifically notes on 08/28/20 and 09/06/20 Advised to discuss questions or concerns with PCP Provided contact information to [facility] [phone], for scheduling an eye exam   CAD  (Status: Goal on Track  (progressing): YES.) Long Term Goal  Assessed understanding of CAD diagnosis Medications reviewed including medications utilized in CAD treatment plan Provided education on importance of blood pressure control in management of CAD; Counseled on the importance of exercise goals with target of 150 minutes per week Reviewed Importance of taking all medications as prescribed Reviewed Importance of attending all scheduled provider appointments Advised to report any changes in symptoms or exercise tolerance Assessed social determinant of health barriers;   Patient Goals/Self-Care Activities: Take medications as prescribed   Attend all scheduled provider appointments Call pharmacy for medication refills 3-7 days in advance of running out of medications Attend church or other social activities Call provider office for new concerns or questions        Follow Up:  Patient agrees to Care Plan and Follow-up.  Plan: The Managed Medicaid care management team will reach out to the patient again over the next 30 days.  Date/time of next scheduled RN care management/care coordination outreach:  08/26/21 @ 3:30pm  Estanislado Emms RN, BSN [place]   [facility] RN Care Coordinator

## 2021-07-24 NOTE — Telephone Encounter (Signed)
Refilled 07/04/21.  Requested Prescriptions  Refused Prescriptions Disp Refills   lisinopril-hydrochlorothiazide (ZESTORETIC) 20-25 MG tablet [Pharmacy Med Name: lisinopril 20 mg-hydrochlorothiazide 25 mg tablet] 180 tablet 2    Sig: TAKE TWO TABLETS BY MOUTH EVERY MORNING     Cardiovascular:  ACEI + Diuretic Combos Failed - 07/24/2021  8:01 AM      Failed - Na in normal range and within 180 days    Sodium  Date Value Ref Range Status  10/19/2020 140 135 - 145 mmol/L Final  02/02/2020 142 134 - 144 mmol/L Final         Failed - K in normal range and within 180 days    Potassium  Date Value Ref Range Status  10/19/2020 3.7 3.5 - 5.1 mmol/L Final         Failed - Cr in normal range and within 180 days    Creatinine, Ser  Date Value Ref Range Status  10/19/2020 1.10 0.61 - 1.24 mg/dL Final         Failed - eGFR is 30 or above and within 180 days    GFR calc Af Amer  Date Value Ref Range Status  02/02/2020 74 >59 mL/min/1.73 Final    Comment:    **[facility] currently reports eGFR in compliance with the current**   recommendations of the [employer]. [facility] will   update reporting as new guidelines are published from the NKF-ASN   Task force.    GFR calc non Af Amer  Date Value Ref Range Status  02/02/2020 64 >59 mL/min/1.73 Final         Passed - Patient is not pregnant      Passed - Last BP in normal range    BP Readings from Last 1 Encounters:  03/13/21 95/62         Passed - Valid encounter within last 6 months    Recent Outpatient Visits          4 months ago Hemiparesis affecting right side as late effect of cerebrovascular accident (CVA) ([place])   [place], Cari S, PA-C   6 months ago Hemiparesis affecting right side as late effect of cerebrovascular accident (CVA) ([place])   [place], Neoma Laming B, MD   10 months ago Hemiparesis affecting right side as late effect of  cerebrovascular accident (CVA) [facility])   [place], MD   11 months ago Gait disturbance   [place], Deborah B, MD   1 year ago Essential hypertension   [place], MD      Future Appointments            In 3 weeks Wynetta Emery Dalbert Batman, MD [place]

## 2021-07-31 ENCOUNTER — Telehealth: Payer: Self-pay | Admitting: Internal Medicine

## 2021-07-31 NOTE — Telephone Encounter (Signed)
Misty Stanley calling from a [facility] wants to know if PCP will agree to continue Home Health Services, NPI provided  ?Best contact: [phone] ?

## 2021-07-31 NOTE — Telephone Encounter (Signed)
Returned [place] call and per Misty Stanley they already received NPI  ?

## 2021-08-05 ENCOUNTER — Ambulatory Visit: Payer: Self-pay | Admitting: *Deleted

## 2021-08-05 ENCOUNTER — Ambulatory Visit (INDEPENDENT_AMBULATORY_CARE_PROVIDER_SITE_OTHER): Payer: Medicaid Other

## 2021-08-05 ENCOUNTER — Other Ambulatory Visit: Payer: Self-pay | Admitting: Internal Medicine

## 2021-08-05 DIAGNOSIS — N182 Chronic kidney disease, stage 2 (mild): Secondary | ICD-10-CM

## 2021-08-05 DIAGNOSIS — I639 Cerebral infarction, unspecified: Secondary | ICD-10-CM

## 2021-08-05 DIAGNOSIS — I129 Hypertensive chronic kidney disease with stage 1 through stage 4 chronic kidney disease, or unspecified chronic kidney disease: Secondary | ICD-10-CM

## 2021-08-05 NOTE — Telephone Encounter (Signed)
Summary: pt med list  ? Mom would like to go over pt med list. Pt has switched pharmacies, and she is concerned he is not getting all his meds   ?  ? ? ?Called patient's mother , on [place], on [phone] work # and did not leave message. Called [phone] and unable to leave message to call back mailbox full. ?

## 2021-08-05 NOTE — Telephone Encounter (Signed)
Patient would like to know if he can be worked in sooner then 08/19/2021. Patient states as per home health services there requiring patient been seen and need permission from PCP. Patient would like a follow up call ?

## 2021-08-05 NOTE — Telephone Encounter (Signed)
Called patient's mother to review requested medications. Mother reports [facility] notified her that the patient has not been getting refills on atorvastatin in 2 months. Unfortunately, the medication was overlooked and not sent out with other medications. Requesting refill request sent to [facility] prior to 08/08/21 because that will be when the next medications will be sent to patient. Please advise if patient is to keep same dose or change dose of atorvastatin. ?

## 2021-08-05 NOTE — Telephone Encounter (Signed)
Returned pt call and made aware that provider doesn't have any sooner appointments. Made pt aware to keep 3/20 appt with provider. Pt states he understands doesn't have any questions or concerns  ?

## 2021-08-06 ENCOUNTER — Telehealth: Payer: Self-pay | Admitting: *Deleted

## 2021-08-06 LAB — CUP PACEART REMOTE DEVICE CHECK
Date Time Interrogation Session: 20230303230311
Implantable Pulse Generator Implant Date: 20201214

## 2021-08-06 NOTE — Telephone Encounter (Signed)
I called [facility] to see why we received a duplicate request for the Zestoretic 20-25 mg tablets.  I can refused this request.   It has already been taken care of this morning.    ?

## 2021-08-06 NOTE — Telephone Encounter (Signed)
Requested Prescriptions  ?Pending Prescriptions Disp Refills  ?? lisinopril-hydrochlorothiazide (ZESTORETIC) 20-25 MG tablet [Pharmacy Med Name: lisinopril 20 mg-hydrochlorothiazide 25 mg tablet] 180 tablet 2  ?  Sig: TAKE TWO TABLETS BY MOUTH EVERY MORNING  ?  ? Cardiovascular:  ACEI + Diuretic Combos Failed - 08/05/2021  1:03 PM  ?  ?  Failed - Na in normal range and within 180 days  ?  Sodium  ?Date Value Ref Range Status  ?10/19/2020 140 135 - 145 mmol/L Final  ?02/02/2020 142 134 - 144 mmol/L Final  ?   ?  ?  Failed - K in normal range and within 180 days  ?  Potassium  ?Date Value Ref Range Status  ?10/19/2020 3.7 3.5 - 5.1 mmol/L Final  ?   ?  ?  Failed - Cr in normal range and within 180 days  ?  Creatinine, Ser  ?Date Value Ref Range Status  ?10/19/2020 1.10 0.61 - 1.24 mg/dL Final  ?   ?  ?  Failed - eGFR is 30 or above and within 180 days  ?  GFR calc Af Amer  ?Date Value Ref Range Status  ?02/02/2020 74 >59 mL/min/1.73 Final  ?  Comment:  ?  **[facility] currently reports eGFR in compliance with the current** ?  recommendations of the [employer]. [facility] will ?  update reporting as new guidelines are published from the NKF-ASN ?  Task force. ?  ? ?GFR calc non Af Amer  ?Date Value Ref Range Status  ?02/02/2020 64 >59 mL/min/1.73 Final  ?   ?  ?  Passed - Patient is not pregnant  ?  ?  Passed - Last BP in normal range  ?  BP Readings from Last 1 Encounters:  ?03/13/21 95/62  ?   ?  ?  Passed - Valid encounter within last 6 months  ?  Recent Outpatient Visits   ?      ? 4 months ago Hemiparesis affecting right side as late effect of cerebrovascular accident (CVA) ([place])  ? [place], [place]  ? 6 months ago Hemiparesis affecting right side as late effect of cerebrovascular accident (CVA) ([place])  ? [place] Ladell Pier, MD  ? 11 months ago Hemiparesis affecting right side as late effect of cerebrovascular accident  (CVA) [facility])  ? [place] Ladell Pier, MD  ? 11 months ago Gait disturbance  ? [place] Ladell Pier, MD  ? 1 year ago Essential hypertension  ? [place] Ladell Pier, MD  ?  ?  ?Future Appointments   ?        ? In 1 week Ladell Pier, MD [place]  ?  ? ?  ?  ?  ? ?

## 2021-08-07 ENCOUNTER — Other Ambulatory Visit: Payer: Self-pay | Admitting: Internal Medicine

## 2021-08-07 DIAGNOSIS — I129 Hypertensive chronic kidney disease with stage 1 through stage 4 chronic kidney disease, or unspecified chronic kidney disease: Secondary | ICD-10-CM

## 2021-08-07 DIAGNOSIS — N182 Chronic kidney disease, stage 2 (mild): Secondary | ICD-10-CM

## 2021-08-07 MED ORDER — LISINOPRIL-HYDROCHLOROTHIAZIDE 20-25 MG PO TABS: ORAL_TABLET | ORAL | 2 refills | Status: DC

## 2021-08-07 NOTE — Telephone Encounter (Signed)
Requested Prescriptions  ?Pending Prescriptions Disp Refills  ?? lisinopril-hydrochlorothiazide (ZESTORETIC) 20-25 MG tablet 60 tablet 2  ?  Sig: TAKE 2 TABLETS BY MOUTH DAILY FOR BLOOD PRESSURE Strength: 20-25 mg  ?  ? Cardiovascular:  ACEI + Diuretic Combos Failed - 08/07/2021  5:35 PM  ?  ?  Failed - Na in normal range and within 180 days  ?  Sodium  ?Date Value Ref Range Status  ?10/19/2020 140 135 - 145 mmol/L Final  ?02/02/2020 142 134 - 144 mmol/L Final  ?   ?  ?  Failed - K in normal range and within 180 days  ?  Potassium  ?Date Value Ref Range Status  ?10/19/2020 3.7 3.5 - 5.1 mmol/L Final  ?   ?  ?  Failed - Cr in normal range and within 180 days  ?  Creatinine, Ser  ?Date Value Ref Range Status  ?10/19/2020 1.10 0.61 - 1.24 mg/dL Final  ?   ?  ?  Failed - eGFR is 30 or above and within 180 days  ?  GFR calc Af Amer  ?Date Value Ref Range Status  ?02/02/2020 74 >59 mL/min/1.73 Final  ?  Comment:  ?  **[facility] currently reports eGFR in compliance with the current** ?  recommendations of the [employer]. [facility] will ?  update reporting as new guidelines are published from the NKF-ASN ?  Task force. ?  ? ?GFR calc non Af Amer  ?Date Value Ref Range Status  ?02/02/2020 64 >59 mL/min/1.73 Final  ?   ?  ?  Passed - Patient is not pregnant  ?  ?  Passed - Last BP in normal range  ?  BP Readings from Last 1 Encounters:  ?03/13/21 95/62  ?   ?  ?  Passed - Valid encounter within last 6 months  ?  Recent Outpatient Visits   ?      ? 4 months ago Hemiparesis affecting right side as late effect of cerebrovascular accident (CVA) ([place])  ? [place], [place]  ? 6 months ago Hemiparesis affecting right side as late effect of cerebrovascular accident (CVA) ([place])  ? [place] Ladell Pier, MD  ? 11 months ago Hemiparesis affecting right side as late effect of cerebrovascular accident (CVA) [facility])  ? [place] Ladell Pier, MD  ? 11 months ago Gait disturbance  ? [place] Ladell Pier, MD  ? 1 year ago Essential hypertension  ? [place] Ladell Pier, MD  ?  ?  ?Future Appointments   ?        ? In 1 week Ladell Pier, MD [place]  ?  ? ?  ?  ?  ? ?

## 2021-08-07 NOTE — Telephone Encounter (Signed)
Copied from CRM [id number]. Topic: General - Other ?>> Aug 07, 2021  8:58 AM Gaetana Michaelis A wrote: ?Reason for CRM: Angie with [facility] would like to speak with a member of clinical staff today  ? ?The patient's medications are being packaged and there are additional concerns with their prescription for lisinopril-hydrochlorothiazide (ZESTORETIC) 20-25 MG tablet [[id number]] ? ?Please contact further when possible ?

## 2021-08-07 NOTE — Telephone Encounter (Addendum)
[facility] called, now closed, will try again tomorrow. [name] called and she says that [facility] called to get the refill on Lisinopril and if we can get it sent tomorrow, they will send it out on 08/09/21 and the patient will be able to take it then. She says he has one dose for tomorrow. She says the pills are bubble packed so that he will take them like he supposed to. She says if by chance we are not able to refill tomorrow to [facility] to call her back and let her know and she will figure something else out to get his medication. ?

## 2021-08-07 NOTE — Addendum Note (Signed)
Addended by: Matilde Sprang on: 08/07/2021 05:34 PM ? ? Modules accepted: Orders ? ?

## 2021-08-07 NOTE — Telephone Encounter (Signed)
Pt's mother called in abd BP med, says he only has one left, asking for short supply if not received by [facility] tomorrrow ?

## 2021-08-09 ENCOUNTER — Telehealth: Payer: Self-pay | Admitting: Internal Medicine

## 2021-08-09 NOTE — Telephone Encounter (Signed)
Rx was sent to requested pharmacy on 08/07/2021.  ? ?

## 2021-08-09 NOTE — Telephone Encounter (Signed)
Copied from CRM [id number]. Topic: General - Other >> Aug 09, 2021  1:21 PM McGill, Darlina Rumpf wrote: Reason for CRM: Star from [facility] faxed Form [phone] on March 7th and is requesting an update. Star stated she would fax again today.  Fax - [phone]

## 2021-08-13 ENCOUNTER — Other Ambulatory Visit: Payer: Self-pay | Admitting: Pharmacist

## 2021-08-13 ENCOUNTER — Telehealth: Payer: Self-pay | Admitting: Pharmacist

## 2021-08-13 DIAGNOSIS — I6523 Occlusion and stenosis of bilateral carotid arteries: Secondary | ICD-10-CM

## 2021-08-13 MED ORDER — ATORVASTATIN CALCIUM 40 MG PO TABS: 40.0000 mg | ORAL_TABLET | Freq: Every evening | ORAL | 0 refills | Status: DC

## 2021-08-13 NOTE — Telephone Encounter (Signed)
Rx for atorvastatin sent to [facility]. I could not access the triage note which is why I am routing this telephone note.  ?

## 2021-08-13 NOTE — Telephone Encounter (Signed)
Contacted Star and lvm making her aware that pt has an appt on 3/20 and form will be completed then  ?

## 2021-08-14 NOTE — Telephone Encounter (Signed)
Called pt made aware of RX sent to pharmacy 

## 2021-08-15 NOTE — Progress Notes (Signed)
Carelink Summary Report / Loop Recorder 

## 2021-08-19 ENCOUNTER — Encounter: Payer: Self-pay | Admitting: Internal Medicine

## 2021-08-19 ENCOUNTER — Other Ambulatory Visit: Payer: Self-pay

## 2021-08-19 ENCOUNTER — Ambulatory Visit: Payer: Medicaid Other | Attending: Internal Medicine | Admitting: Internal Medicine

## 2021-08-19 VITALS — BP 116/69 | HR 60 | Resp 16

## 2021-08-19 DIAGNOSIS — I10 Essential (primary) hypertension: Secondary | ICD-10-CM

## 2021-08-19 DIAGNOSIS — Z23 Encounter for immunization: Secondary | ICD-10-CM | POA: Diagnosis not present

## 2021-08-19 DIAGNOSIS — E782 Mixed hyperlipidemia: Secondary | ICD-10-CM | POA: Diagnosis not present

## 2021-08-19 DIAGNOSIS — I69351 Hemiplegia and hemiparesis following cerebral infarction affecting right dominant side: Secondary | ICD-10-CM | POA: Diagnosis not present

## 2021-08-19 MED ORDER — ATORVASTATIN CALCIUM 40 MG PO TABS: 40.0000 mg | ORAL_TABLET | Freq: Every evening | ORAL | 3 refills | Status: AC

## 2021-08-19 NOTE — Progress Notes (Addendum)
? ? ?Patient ID: Robert PatienceDarryl Donovan, male    DOB: 10/16/1955  MRN: [id number] ? ?CC: Hypertension ? ? ?Subjective: ?Robert Donovan is a 66 y.o. male who presents for chronic ds management.  Mom is with him ?His concerns today include:  ?Pt with hx of HTN, HL, multifocal LT sided embolic strokes (RT sided weakness lower>upper) loop recorder in place, PVD,  history of syphilis treated by ID Dr. Renold DonVu, knee arthritis, obesity.   ? ?Pt requesting extension on his PCS services that has expired. Aid was coming out 5 days a wk for 3 hrs. ?Needs help with light cleaning and baths.  Tells me he now cooks for himself, can put on his clothes and now able to walk down the call to his bedroom using his walker. Now sleeps in his bed.  Mom tells me that he needs some assistance with putting his clothes on even though pt states otherwise.   ?Pt has completed P.T.  [facility] to try to continue to practice what he learned in P.T to help maintain some mobility but Medicaid does not pay for [facility] membership; he can not afford it out of pocket.  Would like to do more P.T. still has right-sided weakness from his stroke that is somewhat improved.  However he wants to feel more comfortable and safe and using his walker when out in public. ? ?Has a walker at home. No falls at home with it but does not feel steady with it when outdoors and when he has to do his shopping . Still uses an old small motorized chair that was loan to him from a family member to get to scat bus. Chair malfunctioned and  threw him out forward on the floor of the SC[employer] bus a few mths ago. ?Mom states that [facility] I [facility] request for in spring of last yr.  She tells me it was denied because I denied some of the features that the chair needed.  I pointed out to her that I did not deny anything.  I submitted my note documenting the need for a motorized wheelchair along with the note from the physical therapist.  The patient's insurance has the  final say of whether they will approve or deny the request based on the documentation submitted. ? ? ?In regards to his blood pressure, he reports compliance with taking the lisinopril/hydrochlorothiazide and amlodipine.  No chest pains or shortness of breath.  With taking atorvastatin.  Needs refill. ? ?HM: Due for flu vaccine and tetanus vaccine.  He is agreeable to receiving those today.  Had colonoscopy 12/2019.  Was told he will be due again in 10 years. ? ?Patient Active Problem List  ? Diagnosis Date Noted  ? Hemiparesis affecting right side as late effect of cerebrovascular accident (CVA) (HCC) 09/06/2020  ? Personal history of fall 09/06/2020  ? Chronic right shoulder pain 09/06/2020  ? Closed bimalleolar fracture of right ankle 07/27/2020  ? History of loop recorder 05/02/2020  ? Carotid artery disease (HCC) 05/02/2020  ? Right hemiparesis (HCC) 04/30/2020  ? Obesity (BMI 35.0-39.9 without comorbidity) 04/30/2020  ? Hyperlipidemia 04/26/2019  ? Hypokalemia   ? Acute embolic stroke (HCC)   ? History of CVA (cerebrovascular accident) 02/11/2018  ? Acute kidney injury superimposed on chronic kidney disease (HCC) 02/11/2018  ? History of syphilis 02/11/2018  ? Constipation 02/11/2018  ? Osteoarthritis 02/11/2018  ? Benign essential HTN 02/11/2018  ? Acute encephalopathy 02/10/2018  ?  ? ?  Current Outpatient Medications on File Prior to Visit  ?Medication Sig Dispense Refill  ? amLODipine (NORVASC) 5 MG tablet Take 1 tablet (5 mg total) by mouth daily. 90 tablet 2  ? aspirin EC 81 MG tablet Take 1 tablet (81 mg total) by mouth daily. 90 tablet 3  ? calcium-vitamin D (OSCAL WITH D) 500-200 MG-UNIT tablet Take 1 tablet by mouth daily.    ? lisinopril-hydrochlorothiazide (ZESTORETIC) 20-25 MG tablet TAKE 2 TABLETS BY MOUTH DAILY FOR BLOOD PRESSURE Strength: 20-25 mg 60 tablet 2  ? Misc. Devices MISC Large blood pressure cuff device ?For home blood pressure monitoring 1 Device 0  ? Multiple Vitamins-Minerals (MENS  MULTIVITAMIN PLUS PO) Take 1 tablet by mouth daily.    ? naproxen (NAPROSYN) 250 MG tablet Take 500 mg by mouth 2 (two) times daily with a meal.    ? vitamin B-12 (CYANOCOBALAMIN) 500 MCG tablet Take 1 tablet (500 mcg total) by mouth daily. 100 tablet 2  ? ?No current facility-administered medications on file prior to visit.  ? ? ?Allergies  ?Allergen Reactions  ? Hydrocodone   ?  Pt stated, "I am not allergic, I do not want to take this medicine ever" ?Codeine - "I do not want to ever take this medicine - not allergic"  ? Oxycodone   ?  Pt stated, "I am not allergic, I do not want to take this medicine ever"  ? ? ?Social History  ? ?Socioeconomic History  ? Marital status: Single  ?  Spouse name: Not on file  ? Number of children: Not on file  ? Years of education: Not on file  ? Highest education level: Not on file  ?Occupational History  ? Not on file  ?Tobacco Use  ? Smoking status: Former  ? Smokeless tobacco: Never  ?Vaping Use  ? Vaping Use: Never used  ?Substance and Sexual Activity  ? Alcohol use: Not Currently  ? Drug use: Not Currently  ?  Comment: hx marijuana use  ? Sexual activity: Not Currently  ?  Partners: Female  ?  Birth control/protection: None  ?  Comment: last encounter 2011  ?Other Topics Concern  ? Not on file  ?Social History Narrative  ? Not on file  ? ?Social Determinants of Health  ? ?Financial Resource Strain: Not on file  ?Food Insecurity: No Food Insecurity  ? Worried About Programme researcher, broadcasting/film/video in the Last Year: Never true  ? Ran Out of Food in the Last Year: Never true  ?Transportation Needs: No Transportation Needs  ? Lack of Transportation (Medical): No  ? Lack of Transportation (Non-Medical): No  ?Physical Activity: Not on file  ?Stress: Not on file  ?Social Connections: Moderately Integrated  ? Frequency of Communication with Friends and Family: More than three times a week  ? Frequency of Social Gatherings with Friends and Family: More than three times a week  ? Attends Religious  Services: More than 4 times per year  ? Active Member of Clubs or Organizations: Yes  ? Attends Banker Meetings: Never  ? Marital Status: Never married  ?Intimate Partner Violence: Not on file  ? ? ?Family History  ?Problem Relation Age of Onset  ? Healthy Mother   ? Heart attack Father   ? Diabetes Brother   ? ? ?Past Surgical History:  ?Procedure Laterality Date  ? LOOP RECORDER IMPLANT  05/16/2019  ? TEE WITHOUT CARDIOVERSION N/A 02/16/2018  ? Procedure: TRANSESOPHAGEAL ECHOCARDIOGRAM (TEE);  Surgeon: Cristal Deer,  Hughie Closs, MD;  Location: [facility];  Service: Cardiovascular;  Laterality: N/A;  ? TOOTH EXTRACTION Bilateral 10/19/2020  ? Procedure: DENTAL RESTORATION/EXTRACTIONS;  Surgeon: Ocie Doyne, DMD;  Location: [facility] OR;  Service: Oral Surgery;  Laterality: Bilateral;  ? ? ?ROS: ?Review of Systems ?Negative except as stated above ? ?PHYSICAL EXAM: ?BP 116/69   Pulse 60   Resp 16   SpO2 98%   ?Physical Exam ? ? ?General appearance - alert, well appearing, older African-American male sitting in ability chair in NAD and in no distress.  The mobility chair does appear a little small for him. ?Mental status -patient a little forgetful but answers most questions appropriately. ?Mouth - mucous membranes moist, pharynx normal without lesions ?Chest - clear to auscultation, no wheezes, rales or rhonchi, symmetric air entry ?Heart - normal rate, regular rhythm, normal S1, S2, no murmurs, rubs, clicks or gallops ?Neurological -grip right 3-4/5, left 5/5.  Power proximal/distal upper extremity 4/5 and 5/5 on the left ?Power in the lower extremities 4+/5 on the left, 4/5 on the right ?Gait not observed. ? ?  Latest Ref Rng & Units 08/19/2021  ?  3:24 PM 10/19/2020  ?  8:53 AM 02/02/2020  ?  3:35 PM  ?CMP  ?Glucose 70 - 99 mg/dL 92   95   87    ?BUN 8 - 27 mg/dL 18   20   20     ?Creatinine 0.76 - 1.27 mg/dL   8.58   8.50    ?Sodium 134 - 144 mmol/L 142   140   142    ?Potassium 3.5 - 5.2 mmol/L 3.9    3.7   4.1    ?Chloride 96 - 106 mmol/L 102   104   105    ?CO2 20 - 29 mmol/L 24    24    ?Calcium 8.6 - 10.2 mg/dL 2.77    9.7    ?Total Protein 6.0 - 8.5 g/dL 7.0    7.2    ?Total Bilirubin 0.0 - 1.2 mg/dL 0

## 2021-08-19 NOTE — Telephone Encounter (Signed)
Form has been printed off for provider  ?

## 2021-08-20 LAB — COMPREHENSIVE METABOLIC PANEL
ALT: 7 IU/L (ref 0–44)
AST: 10 IU/L (ref 0–40)
Albumin/Globulin Ratio: 1.8 (ref 1.2–2.2)
Albumin: 4.5 g/dL (ref 3.8–4.8)
Alkaline Phosphatase: 98 IU/L (ref 44–121)
BUN/Creatinine Ratio: 13 (ref 10–24)
BUN: 18 mg/dL (ref 8–27)
Bilirubin Total: 0.8 mg/dL (ref 0.0–1.2)
CO2: 24 mmol/L (ref 20–29)
Calcium: 10.1 mg/dL (ref 8.6–10.2)
Chloride: 102 mmol/L (ref 96–106)
Creatinine, Ser: 1.36 mg/dL — ABNORMAL HIGH (ref 0.76–1.27)
Globulin, Total: 2.5 g/dL (ref 1.5–4.5)
Glucose: 92 mg/dL (ref 70–99)
Potassium: 3.9 mmol/L (ref 3.5–5.2)
Sodium: 142 mmol/L (ref 134–144)
Total Protein: 7 g/dL (ref 6.0–8.5)
eGFR: 58 mL/min/{1.73_m2} — ABNORMAL LOW (ref >59)

## 2021-08-20 LAB — LIPID PANEL
Chol/HDL Ratio: 3.4 ratio (ref 0.0–5.0)
Cholesterol, Total: 147 mg/dL (ref 100–199)
HDL: 43 mg/dL (ref >39)
LDL Chol Calc (NIH): 85 mg/dL (ref 0–99)
Triglycerides: 102 mg/dL (ref 0–149)
VLDL Cholesterol Cal: 19 mg/dL (ref 5–40)

## 2021-08-20 LAB — CBC
Hematocrit: 47.2 % (ref 37.5–51.0)
Hemoglobin: 15.6 g/dL (ref 13.0–17.7)
MCH: 29.1 pg (ref 26.6–33.0)
MCHC: 33.1 g/dL (ref 31.5–35.7)
MCV: 88 fL (ref 79–97)
Platelets: 213 10*3/uL (ref 150–450)
RBC: 5.36 x10E6/uL (ref 4.14–5.80)
RDW: 12.8 % (ref 11.6–15.4)
WBC: 8.2 10*3/uL (ref 3.4–10.8)

## 2021-08-20 NOTE — Telephone Encounter (Signed)
I spoke to [facility] and she will need to check with her team to determine the status of the order.  ?

## 2021-08-21 ENCOUNTER — Telehealth: Payer: Self-pay | Admitting: *Deleted

## 2021-08-21 NOTE — Telephone Encounter (Signed)
Copied from CRM [id number]. Topic: General - Other ?>> Aug 20, 2021 11:46 AM Marylen Ponto wrote: ?Reason for CRM: Dois Davenport with [facility] called for an update on the [facility] service paperwork that was faxed on 08/06/21. Cb# [phone] ?

## 2021-08-21 NOTE — Telephone Encounter (Signed)
Copied from CRM [id number]. Topic: General - Other ?>> Aug 20, 2021 11:06 AM Gaetana Michaelis A wrote: ?Reason for CRM: Lawerance Cruel has called for an update on a previously submitted form  ? ?The  [id number] form was submitted on 08/14/21 ? ?Please contact further when possible ?

## 2021-08-22 ENCOUNTER — Telehealth: Payer: Self-pay

## 2021-08-22 NOTE — Telephone Encounter (Signed)
Referral for [facility] and provider visit notes  faxed to [facility] - fax # [phone] ?

## 2021-08-22 NOTE — Telephone Encounter (Signed)
PCS form has been given to back to Erskine Squibb and she will fax  ?

## 2021-08-23 NOTE — Telephone Encounter (Signed)
Star from [facility] called to check on PCS form / I advised her it was faxed and the number but she stated the correct fax number is [phone] / please refax  ?

## 2021-08-25 ENCOUNTER — Other Ambulatory Visit: Payer: Self-pay | Admitting: Internal Medicine

## 2021-08-25 DIAGNOSIS — N182 Chronic kidney disease, stage 2 (mild): Secondary | ICD-10-CM

## 2021-08-26 ENCOUNTER — Encounter: Payer: Self-pay | Admitting: Internal Medicine

## 2021-08-26 ENCOUNTER — Other Ambulatory Visit: Payer: Self-pay

## 2021-08-26 ENCOUNTER — Other Ambulatory Visit: Payer: Self-pay | Admitting: *Deleted

## 2021-08-26 ENCOUNTER — Telehealth: Payer: Self-pay

## 2021-08-26 NOTE — Patient Outreach (Signed)
?Medicaid Managed Care   ?Nurse Care Manager Note ? ?08/26/2021 ?Name:  Robert Donovan MRN:  [id number] DOB:  01-Feb-1956 ? ?Robert Donovan is an 66 y.o. year old male who is a primary patient of [name], MD.  [facility] Managed Care Coordination team was consulted for assistance with:    ?CAD ? ?Mr. Krise was given information about Medicaid Managed Care Coordination team services today. [name] agreed to services and verbal consent obtained. ? ?Engaged with patient by telephone for follow up visit in response to provider referral for case management and/or care coordination services.  ? ?Assessments/Interventions:  Review of past medical history, allergies, medications, health status, including review of consultants reports, laboratory and other test data, was performed as part of comprehensive evaluation and provision of chronic care management services. ? ?SDOH (Social Determinants of Health) assessments and interventions performed: ?SDOH Interventions   ? ?Flowsheet Row Most Recent Value  ?SDOH Interventions   ?Housing Interventions Intervention Not Indicated  ?Transportation Interventions Intervention Not Indicated  ? ?  ? ? ?Care Plan ? ?Allergies  ?Allergen Reactions  ? Hydrocodone   ?  Pt stated, "I am not allergic, I do not want to take this medicine ever" ?Codeine - "I do not want to ever take this medicine - not allergic"  ? Oxycodone   ?  Pt stated, "I am not allergic, I do not want to take this medicine ever"  ? ? ?Medications Reviewed Today   ? ? Reviewed by Particia Lather, RMA (Registered Medical Assistant) on 08/19/21 at 1406  Med List Status: <None>  ? ?Medication Order Taking? Sig Documenting Provider Last Dose Status Informant  ?amLODipine (NORVASC) 5 MG tablet [id number] No Take 1 tablet (5 mg total) by mouth daily. [name], MD Taking Active   ?aspirin EC 81 MG tablet [id number] No Take 1 tablet (81 mg total) by mouth daily. [name], MD Taking Active    ?atorvastatin (LIPITOR) 40 MG tablet [id number]  Take 1 tablet (40 mg total) by mouth every evening. [name], MD  Active   ?calcium-vitamin D (OSCAL WITH D) 500-200 MG-UNIT tablet [id number] No Take 1 tablet by mouth daily. [provider] Taking Active Self  ?lisinopril-hydrochlorothiazide (ZESTORETIC) 20-25 MG tablet [id number]  TAKE 2 TABLETS BY MOUTH DAILY FOR BLOOD PRESSURE Strength: 20-25 mg [name], MD  Active   ?Misc. Devices MISC [id number] No Large blood pressure cuff device ?For home blood pressure monitoring [name], MD Taking Active   ?Multiple Vitamins-Minerals (MENS MULTIVITAMIN PLUS PO) [id number] No Take 1 tablet by mouth daily. [provider] Taking Active Self  ?naproxen (NAPROSYN) 250 MG tablet [id number] No Take 500 mg by mouth 2 (two) times daily with a meal. [provider] Taking Active Self  ?vitamin B-12 (CYANOCOBALAMIN) 500 MCG tablet [id number] No Take 1 tablet (500 mcg total) by mouth daily. [name], MD Taking Active   ? ?  ?  ? ?  ? ? ?Patient Active Problem List  ? Diagnosis Date Noted  ? Hemiparesis affecting right side as late effect of cerebrovascular accident (CVA) (HCC) 09/06/2020  ? Personal history of fall 09/06/2020  ? Chronic right shoulder pain 09/06/2020  ? Closed bimalleolar fracture of right ankle 07/27/2020  ? History of loop recorder 05/02/2020  ? Carotid artery disease (HCC) 05/02/2020  ? Right hemiparesis (HCC) 04/30/2020  ? Obesity (BMI 35.0-39.9 without comorbidity) 04/30/2020  ? Hyperlipidemia 04/26/2019  ? Hypokalemia   ?  Acute embolic stroke (HCC)   ? History of CVA (cerebrovascular accident) 02/11/2018  ? Acute kidney injury superimposed on chronic kidney disease (HCC) 02/11/2018  ? History of syphilis 02/11/2018  ? Constipation 02/11/2018  ? Osteoarthritis 02/11/2018  ? Benign essential HTN 02/11/2018  ? Acute encephalopathy 02/10/2018  ? ? ?Conditions to be addressed/monitored per PCP order:   CAD ? ?Care Plan : RN Care Manager Plan of Care  ?Updates made by [name], RN since 08/26/2021 12:00 AM  ?  ? ?Problem: Chronic Heatlh Management Needs Related to History of CVA   ?  ? ?Long-Range Goal: Development of Plan of Care to address heatlh management needs related to history of CVA   ?Start Date: 05/14/2021  ?Expected End Date: 09/27/2021  ?Priority: High  ?Note:   ?Current Barriers:  ?Chronic Disease Management support and education needs related to CAD-[name]/DPR, Julieanne Cotton is concerned re: denial for electric wheelchair. She is planning to work on transitioning to [employer]. Mr. Mapps has an appointment on 09/03/21 for Neuro PT consult. ? ?RNCM Clinical Goal(s):  ?Patient will verbalize understanding of plan for management of CAD as evidenced by verbalization and self monitoring activities ?take all medications exactly as prescribed and will call provider for medication related questions as evidenced by documentation in EMR    ?attend all scheduled medical appointments: PCP on 3/20 and TFC on 08/28/21 as evidenced by provider documentation in EMR        ?continue to work with Medical illustrator and/or Social Worker to address care management and care coordination needs related to CAD as evidenced by adherence to CM Team Scheduled appointments     through collaboration with Medical illustrator, provider, and care team.  ? ?Interventions: ?Inter-disciplinary care team collaboration (see longitudinal plan of care) ?Evaluation of current treatment plan related to  self management and patient's adherence to plan as established by provider ?Advised to discuss questions or concerns with PCP and take all medications to future provider appointments ?Provided contact information to [facility] [phone], for scheduling an eye exam ? ?CAD  (Status: Goal on Track (progressing): YES.) Long Term Goal  ?Assessed understanding of CAD diagnosis ?Medications reviewed including medications utilized in  CAD treatment plan ?Provided education on importance of blood pressure control in management of CAD; ?Provided education on Importance of limiting foods high in cholesterol; ?Reviewed Importance of taking all medications as prescribed ?Reviewed Importance of attending all scheduled provider appointments ?Advised to report any changes in symptoms or exercise tolerance ?Assessed social determinant of health barriers;  ? ?Patient Goals/Self-Care Activities: ?Take medications as prescribed   ?Attend all scheduled provider appointments ?Call pharmacy for medication refills 3-7 days in advance of running out of medications ?Attend church or other social activities ?Call provider office for new concerns or questions  ? ? ?  ? ? ?Follow Up:  Patient agrees to Care Plan and Follow-up. ? ?Plan: The Managed Medicaid care management team will reach out to the patient again over the next 30 days. ? ?Date/time of next scheduled RN care management/care coordination outreach:  09/25/21 @ 2:30pm ? ?Estanislado Emms RN, BSN ?[place]  [facility] ?RN Care Coordinator ?[phone] ?

## 2021-08-26 NOTE — Patient Instructions (Signed)
Visit Information ? ?Mr. Bachar was given information about Medicaid Managed Care team care coordination services as a part of their [facility] Medicaid benefit. [name] verbally consented to engagement with the [facility] Managed Care team.  ? ?If you are experiencing a medical emergency, please call 911 or report to your local emergency department or urgent care.  ? ?If you have a non-emergency medical problem during routine business hours, please contact your provider's office and ask to speak with a nurse.  ? ?For questions related to your [facility], please call: [phone] or visit the homepage here: [email or web address] ? ?If you would like to schedule transportation through your [facility], please call the following number at least 2 days in advance of your appointment: [phone]. ? Rides for urgent appointments can also be made after hours by calling Member Services. ? ?Call the Behavioral Health Crisis Line at [phone], at any time, 24 hours a day, 7 days a week. If you are in danger or need immediate medical attention call 911. ? ?If you would like help to quit smoking, call 1-800-QUIT-NOW (4580908202) OR Espa?ol: 1-855-D?jelo-Ya 336-497-2177) o para m?s informaci?n haga clic aqu? or Text READY to 200-400 to register via text ? ?[name], ? ? ?Please see education materials related to stroke prevention provided by MyChart link. and as Financial risk analyst.  ? ?The patient verbalized understanding of instructions, educational materials, and care plan provided today and agreed to receive a mailed copy of patient instructions, educational materials, and care plan.  ? ?Telephone follow up appointment with Managed Medicaid care management team member scheduled for:09/25/21 @ 2:30pm ? ?Estanislado Emms RN, BSN ?[place]  [facility] ?RN Care  Coordinator ? ? ?Following is a copy of your plan of care:  ?Care Plan : RN Care Manager Plan of Care  ?Updates made by [name], RN since 08/26/2021 12:00 AM  ?  ? ?Problem: Chronic Heatlh Management Needs Related to History of CVA   ?  ? ?Long-Range Goal: Development of Plan of Care to address heatlh management needs related to history of CVA   ?Start Date: 05/14/2021  ?Expected End Date: 09/27/2021  ?Priority: High  ?Note:   ?Current Barriers:  ?Chronic Disease Management support and education needs related to CAD-[name]/DPR, Julieanne Cotton is concerned re: denial for electric wheelchair. She is planning to work on transitioning to [employer]. Mr. Kluth has an appointment on 09/03/21 for Neuro PT consult. ? ?RNCM Clinical Goal(s):  ?Patient will verbalize understanding of plan for management of CAD as evidenced by verbalization and self monitoring activities ?take all medications exactly as prescribed and will call provider for medication related questions as evidenced by documentation in EMR    ?attend all scheduled medical appointments: PCP on 3/20 and TFC on 08/28/21 as evidenced by provider documentation in EMR        ?continue to work with Medical illustrator and/or Social Worker to address care management and care coordination needs related to CAD as evidenced by adherence to CM Team Scheduled appointments     through collaboration with Medical illustrator, provider, and care team.  ? ?Interventions: ?Inter-disciplinary care team collaboration (see longitudinal plan of care) ?Evaluation of current treatment plan related to  self management and patient's adherence to plan as established by provider ?Advised to discuss questions or concerns with PCP and take all medications to future provider appointments ?Provided contact information to [facility] [phone],  for scheduling an eye exam ? ?CAD  (Status: Goal on Track (progressing): YES.) Long Term Goal  ?Assessed understanding of CAD  diagnosis ?Medications reviewed including medications utilized in CAD treatment plan ?Provided education on importance of blood pressure control in management of CAD; ?Provided education on Importance of limiting foods high in cholesterol; ?Reviewed Importance of taking all medications as prescribed ?Reviewed Importance of attending all scheduled provider appointments ?Advised to report any changes in symptoms or exercise tolerance ?Assessed social determinant of health barriers;  ? ?Patient Goals/Self-Care Activities: ?Take medications as prescribed   ?Attend all scheduled provider appointments ?Call pharmacy for medication refills 3-7 days in advance of running out of medications ?Attend church or other social activities ?Call provider office for new concerns or questions  ? ? ?  ?  ?

## 2021-08-26 NOTE — Telephone Encounter (Signed)
I spoke to [facility].  She explained that her team has spoken to the patient's mother and they decided not to appeal the insurance company denial for the power wheelchair.  I ?Instead, the patient's mother said that they will be converting his insurance back to standard Medicaid and will then start the process over for qualifying for a power chair.   ?Eunice Blase said that we do not need to do anything at this point.  [facility] will be in touch with the patient's mother and will contact [facility] when the insurance has switched and they are ready to proceed with the order  ?

## 2021-08-26 NOTE — Telephone Encounter (Signed)
Call placed to Robert Donovan/ [facility] and informed her that the information was faxed to the number she had on her cover letter. She stated that was fine and she has already received the information that she needs. No need to re-fax ?

## 2021-08-27 NOTE — Telephone Encounter (Signed)
Requested Prescriptions  ?Pending Prescriptions Disp Refills  ?? amLODipine (NORVASC) 5 MG tablet [Pharmacy Med Name: amlodipine 5 mg tablet] 90 tablet 1  ?  Sig: TAKE ONE TABLET BY MOUTH EVERY MORNING  ?  ? Cardiovascular: Calcium Channel Blockers 2 Passed - 08/25/2021  8:04 AM  ?  ?  Passed - Last BP in normal range  ?  BP Readings from Last 1 Encounters:  ?08/19/21 116/69  ?   ?  ?  Passed - Last Heart Rate in normal range  ?  Pulse Readings from Last 1 Encounters:  ?08/19/21 60  ?   ?  ?  Passed - Valid encounter within last 6 months  ?  Recent Outpatient Visits   ?      ? 1 week ago Essential hypertension  ? [place] Ladell Pier, MD  ? 5 months ago Hemiparesis affecting right side as late effect of cerebrovascular accident (CVA) [facility])  ? [place], [place]  ? 7 months ago Hemiparesis affecting right side as late effect of cerebrovascular accident (CVA) ([place])  ? [place] Ladell Pier, MD  ? 11 months ago Hemiparesis affecting right side as late effect of cerebrovascular accident (CVA) [facility])  ? [place] Ladell Pier, MD  ? 12 months ago Gait disturbance  ? [place] Ladell Pier, MD  ?  ?  ?Future Appointments   ?        ? In 3 months Ladell Pier, MD [place]  ?  ? ?  ?  ?  ? ?

## 2021-08-28 ENCOUNTER — Encounter: Payer: Self-pay | Admitting: Podiatry

## 2021-08-28 ENCOUNTER — Ambulatory Visit (INDEPENDENT_AMBULATORY_CARE_PROVIDER_SITE_OTHER): Payer: Medicaid Other | Admitting: Podiatry

## 2021-08-28 ENCOUNTER — Other Ambulatory Visit: Payer: Self-pay

## 2021-08-28 DIAGNOSIS — M79674 Pain in right toe(s): Secondary | ICD-10-CM

## 2021-08-28 DIAGNOSIS — D689 Coagulation defect, unspecified: Secondary | ICD-10-CM

## 2021-08-28 DIAGNOSIS — N189 Chronic kidney disease, unspecified: Secondary | ICD-10-CM

## 2021-08-28 DIAGNOSIS — B351 Tinea unguium: Secondary | ICD-10-CM

## 2021-08-28 DIAGNOSIS — M79675 Pain in left toe(s): Secondary | ICD-10-CM

## 2021-08-28 DIAGNOSIS — I739 Peripheral vascular disease, unspecified: Secondary | ICD-10-CM

## 2021-08-28 DIAGNOSIS — N179 Acute kidney failure, unspecified: Secondary | ICD-10-CM

## 2021-08-28 NOTE — Progress Notes (Signed)
This patient returns to the office for at risk foot care.  This patient requires this care by a professional since this patient will be at risk due to having swelling, peripheral angiopathy acute kidney disease and symtomatic toenails.    These nails are painful walking and wearing shoes.  This patient presents for at risk foot care today.  General Appearance  Alert, conversant and in no acute stress.  Vascular  Dorsalis pedis palpable and posterior tibial  pulses are non palpable  bilaterally.  Capillary return is within normal limits  bilaterally. Temperature is within normal limits  Bilaterally. Bilateral swelling is present   Neurologic  Senn-Weinstein monofilament wire test within normal limits  bilaterally. Muscle power within normal limits bilaterally.  Nails Thick disfigured discolored nails with subungual debris  from hallux to fifth toes bilaterally. No evidence of bacterial infection or drainage bilaterally.   Orthopedic  No limitations of motion  feet .  No crepitus or effusions noted.  No bony pathology or digital deformities noted.    Skin  normotropic skin with no porokeratosis noted bilaterally.  No signs of infections or ulcers noted.    I  Assessment:  Onychomycosis  Pain in right toes  Pain in left toes, peripheral angiopathy with swelling bilateral.   Plan: Consent was obtained for treatment procedures.   Mechanical debridement of nails 1-5  bilaterally performed with a nail nipper.  Filed with dremel without incident.    Return office visit   3 months.    Told patient to return for periodic foot care and evaluation due to potential at risk complications.  Meeka Cartelli DPM    

## 2021-09-03 ENCOUNTER — Telehealth: Payer: Self-pay | Admitting: Internal Medicine

## 2021-09-03 ENCOUNTER — Ambulatory Visit: Payer: Medicaid Other

## 2021-09-03 NOTE — Telephone Encounter (Signed)
Please have Opal Sidles call Jackelyn Poling with [place]  [phone] ?

## 2021-09-03 NOTE — Telephone Encounter (Signed)
Copied from [place] [id number]. Topic: General - Other >> Sep 03, 2021  9:19 AM Valere Dross wrote: Reason for CRM: Mardene Celeste from [facility] called in on behalf of pt stating he is changing insurance and needing to restart the process of getting Power Chair., and needed PCP nurse to give a call to see about setting up a time to speak, please advise.

## 2021-09-03 NOTE — Telephone Encounter (Signed)
Jane would you be able to take care of this  

## 2021-09-03 NOTE — Telephone Encounter (Signed)
Call returned to [employer]/ Speciality Team Power Wheelchairs # [phone], spoke to [facility] who explained that the PT submitted a justification for the denial but it was still denied by the insurance company noting that the patient does not need a power wheelchair. The entire order was denied not just a specific item for the wheelchair.  ? ?As of 08/31/2021, the patient's insurance has changed and he now has [employer]. Beth said that a new order for PT assessment for the power wheelchair can be placed and if the PT finds that the patient will still benefit from a power wheelchair a new order for the chair can be submitted.  ?

## 2021-09-04 NOTE — Telephone Encounter (Signed)
Call returned to [facility] and informed her of what Dr Laural Benes has shared from PT:  ? ?I referred pt to P.T after recent visit with me.  It was sent on to neuro P.T.  P.T Lavone Nian called him yesterday to schedule but pt declined stating he does not want to start now because his foot is hurting.  P.T plans to f/u with him in 2 wks to see if he wants to schedule then.  Once he starts neuro P.T. He should let me know so that I can request the therapist assess whether he needs and would benefit from motorize WC.  ? ?Eunice Blase will check back with me in about 2 weeks to inquire if we are proceeding with placing an order for a power wheelchair.  ?

## 2021-09-09 ENCOUNTER — Ambulatory Visit (INDEPENDENT_AMBULATORY_CARE_PROVIDER_SITE_OTHER): Payer: Medicaid Other

## 2021-09-09 DIAGNOSIS — I639 Cerebral infarction, unspecified: Secondary | ICD-10-CM | POA: Diagnosis not present

## 2021-09-11 ENCOUNTER — Telehealth: Payer: Self-pay | Admitting: Internal Medicine

## 2021-09-11 ENCOUNTER — Ambulatory Visit (INDEPENDENT_AMBULATORY_CARE_PROVIDER_SITE_OTHER): Payer: Medicaid Other

## 2021-09-11 ENCOUNTER — Encounter: Payer: Self-pay | Admitting: Orthopaedic Surgery

## 2021-09-11 ENCOUNTER — Ambulatory Visit (INDEPENDENT_AMBULATORY_CARE_PROVIDER_SITE_OTHER): Payer: Medicaid Other | Admitting: Orthopaedic Surgery

## 2021-09-11 VITALS — BP 106/66

## 2021-09-11 DIAGNOSIS — M25571 Pain in right ankle and joints of right foot: Secondary | ICD-10-CM

## 2021-09-11 DIAGNOSIS — Z8673 Personal history of transient ischemic attack (TIA), and cerebral infarction without residual deficits: Secondary | ICD-10-CM | POA: Diagnosis not present

## 2021-09-11 DIAGNOSIS — I69351 Hemiplegia and hemiparesis following cerebral infarction affecting right dominant side: Secondary | ICD-10-CM

## 2021-09-11 LAB — CUP PACEART REMOTE DEVICE CHECK
Date Time Interrogation Session: 20230405231001
Implantable Pulse Generator Implant Date: 20201214

## 2021-09-11 NOTE — Telephone Encounter (Signed)
Copied from [place] [id number]. Topic: General - Other >> Sep 11, 2021  1:27 PM Yvette Rack wrote: Reason for CRM: [place] with [facility] requests update on fax that was sent on 09/03/21. Cb# [phone]

## 2021-09-11 NOTE — Telephone Encounter (Signed)
Returned call to [place] and made aware that we have received paperwork and once provider is done with it we will fax.  ?

## 2021-09-13 NOTE — Progress Notes (Signed)
? ?Office Visit Note ?  ?Patient: Robert Donovan           ?Date of Birth: [birth year]           ?MRN: [id number] ?Visit Date: 09/11/2021 ?             ?Requested by: [name], MD ?[address] ?[address] ?[place],  [place] [address] ?PCP: [name], MD ? ? ?Assessment & Plan: ?Visit Diagnoses:  ?1. Pain in right ankle and joints of right foot   ?2. History of CVA (cerebrovascular accident)   ?3. Hemiparesis affecting right side as late effect of cerebrovascular accident (CVA) (HCC)   ? ? ?Plan: I encouraged him to continue to work on his daily walking with his walker he can time his walking and gradually slowly try to increase it and increase his standing time.  Some of the physical therapy activities has made his ankle worse.  We discussed an option of double upright short leg brace but with his hemiparesis right side weakness he likely will complain of the weight and not be able to use it.  We discussed problems with plastic brace and skin problems.  He walks until he gets tired and has some increased ankle problems and stops rest and then can repeat later in the day.  He can follow-up with me as needed .  We reviewed with him that the ankle fracture from last year healed in varus but is now completely healed.  He understands surgery would be required to straighten it and it is unlikely with a straighter ankle he be able to ambulate further since his limiting factor is his hemiparesis. ? ?Follow-Up Instructions: No follow-ups on file.  ? ?Orders:  ?Orders Placed This Encounter  ?Procedures  ? XR Foot Complete Right  ? XR Ankle Complete Right  ? ?No orders of the defined types were placed in this encounter. ? ? ? ? Procedures: ?No procedures performed ? ? ?Clinical Data: ?No additional findings. ? ? ?Subjective: ?Chief Complaint  ?Patient presents with  ? Right Ankle - Pain  ? Right Foot - Pain  ? ? ?HPI 66 year old male previous CVA with right hemiparesis and wheelchair.  Ankle fracture February 2022  initially nondisplaced.  Patient was weightbearing and developed varus and is healed and 20 degrees varus.  Patient's been doing therapy and ambulates for 10 minutes using a walker and has to sit and rest and complains of right ankle pain.  Patient is taken some Aleve in the past but was told to stop and use Tylenol. ? ?Review of Systems previous CVA with hemiparesis.  Carotid artery disease hypertension kidney disease encephalopathy. ? ? ?Objective: ?Vital Signs: BP 106/66  ? ?Physical Exam ?Constitutional:   ?   Appearance: He is well-developed.  ?   Comments: Wheelchair-bound.  ?HENT:  ?   Head: Normocephalic and atraumatic.  ?   Right Ear: External ear normal.  ?   Left Ear: External ear normal.  ?Eyes:  ?   Pupils: Pupils are equal, round, and reactive to light.  ?Neck:  ?   Thyroid: No thyromegaly.  ?   Trachea: No tracheal deviation.  ?Cardiovascular:  ?   Rate and Rhythm: Normal rate.  ?Pulmonary:  ?   Effort: Pulmonary effort is normal.  ?   Breath sounds: No wheezing.  ?Abdominal:  ?   General: Bowel sounds are normal.  ?   Palpations: Abdomen is soft.  ?Musculoskeletal:  ?  Cervical back: Neck supple.  ?Skin: ?   General: Skin is warm and dry.  ?   Capillary Refill: Capillary refill takes less than 2 seconds.  ?Neurological:  ?   Mental Status: He is alert and oriented to person, place, and time.  ?Psychiatric:     ?   Behavior: Behavior normal.     ?   Thought Content: Thought content normal.     ?   Judgment: Judgment normal.  ? ? ?Ortho Exam hemiparesis right side.  He has some ankle dorsiflexion plantarflexion on the right that is not painful.  20 degrees varus clinically. ? ?Specialty Comments:  ?No specialty comments available. ? ?Imaging: ?Three-view x-rays right ankle obtained and reviewed.  This shows medial malleolar healed fracture with 20 degrees varus of the ankle.  No subluxation noted on lateral image.  No change in position from 09/21/2020 images. ? ?Impression: Healed  right medial and  posterior malleolar fracture and 20 degrees varus. ? ?Impression: Right ankle bimalleolar fracture with union and 20 varus position. ? ? ?PMFS History: ?Patient Active Problem List  ? Diagnosis Date Noted  ? Hemiparesis affecting right side as late effect of cerebrovascular accident (CVA) (HCC) 09/06/2020  ? Personal history of fall 09/06/2020  ? Chronic right shoulder pain 09/06/2020  ? Closed bimalleolar fracture of right ankle 07/27/2020  ? History of loop recorder 05/02/2020  ? Carotid artery disease (HCC) 05/02/2020  ? Right hemiparesis (HCC) 04/30/2020  ? Obesity (BMI 35.0-39.9 without comorbidity) 04/30/2020  ? Hyperlipidemia 04/26/2019  ? Hypokalemia   ? Acute embolic stroke (HCC)   ? History of CVA (cerebrovascular accident) 02/11/2018  ? Acute kidney injury superimposed on chronic kidney disease (HCC) 02/11/2018  ? History of syphilis 02/11/2018  ? Constipation 02/11/2018  ? Osteoarthritis 02/11/2018  ? Benign essential HTN 02/11/2018  ? Acute encephalopathy 02/10/2018  ? ?Past Medical History:  ?Diagnosis Date  ? Arthritis   ? Bilateral Knees  ? High cholesterol   ? History of loop recorder 05/16/2019  ? Hypertension   ? Stroke (HCC) 02/10/2018  ?  ?Family History  ?Problem Relation Age of Onset  ? Healthy Mother   ? Heart attack Father   ? Diabetes Brother   ?  ?Past Surgical History:  ?Procedure Laterality Date  ? LOOP RECORDER IMPLANT  05/16/2019  ? TEE WITHOUT CARDIOVERSION N/A 02/16/2018  ? Procedure: TRANSESOPHAGEAL ECHOCARDIOGRAM (TEE);  Surgeon: Jodelle Red, MD;  Location[facility] END[facility]COPY;  Service: Cardiovascular;  Laterality: N/A;  ? TOOTH EXTRACTION Bilateral 10/19/2020  ? Procedure: DENTAL RESTORATION/EXTRACTIONS;  Surgeon: Ocie Doyne, DMD;  Location: [facility] OR;  Service: Oral Surgery;  Laterality: Bilateral;  ? ?Social History  ? ?Occupational History  ? Not on file  ?Tobacco Use  ? Smoking status: Former  ? Smokeless tobacco: Never  ?Vaping Use  ? Vaping Use: Never used  ?Substance and  Sexual Activity  ? Alcohol use: Not Currently  ? Drug use: Not Currently  ?  Comment: hx marijuana use  ? Sexual activity: Not Currently  ?  Partners: Female  ?  Birth control/protection: None  ?  Comment: last encounter 2011  ? ? ? ? ? ? ?

## 2021-09-17 ENCOUNTER — Telehealth: Payer: Self-pay

## 2021-09-17 NOTE — Telephone Encounter (Signed)
Completed PCS referral faxed to [employer] [facility] # [phone] ?

## 2021-09-19 ENCOUNTER — Other Ambulatory Visit: Payer: Self-pay | Admitting: Internal Medicine

## 2021-09-23 ENCOUNTER — Telehealth: Payer: Self-pay | Admitting: Internal Medicine

## 2021-09-23 NOTE — Telephone Encounter (Signed)
Copied from CRM [id number]. Topic: General - Other ?>> Sep 23, 2021  9:25 AM Jaquita Rector A wrote: ?Reason for CRM: Candice Pugh with [facility] called in to inqure of Dr Laural Benes if they can get orders for a shower chair need it sent to patients address ASAP. Any questions call Candice at Ph# [phone] ?

## 2021-09-23 NOTE — Telephone Encounter (Signed)
Routing to patient's PCP

## 2021-09-25 ENCOUNTER — Other Ambulatory Visit: Payer: Self-pay | Admitting: *Deleted

## 2021-09-25 ENCOUNTER — Other Ambulatory Visit: Payer: Self-pay

## 2021-09-25 DIAGNOSIS — I69351 Hemiplegia and hemiparesis following cerebral infarction affecting right dominant side: Secondary | ICD-10-CM

## 2021-09-25 NOTE — Telephone Encounter (Signed)
Rx has been printed out and will await for provider signature. Will be faxed once signed  ?

## 2021-09-25 NOTE — Patient Instructions (Signed)
Visit Information ? ?Robert Donovan was given information about Medicaid Managed Care team care coordination services as a part of their [place] Medicaid benefit. Manus Rudd verbally consentedto engagement with the [facility] Managed Care team.  ? ?If you are experiencing a medical emergency, please call 911 or report to your local emergency department or urgent care.  ? ?If you have a non-emergency medical problem during routine business hours, please contact your provider's office and ask to speak with a nurse.  ? ?For questions related to your [employer] [facility] health plan, please call: [phone]  OR visit the member homepage at: [email or web address] ? ?If you would like to schedule transportation through your [place] Medicaid plan, please call the following number at least 2 days in advance of your appointment: [phone] ? ?If you are experiencing a behavioral health crisis, call the [place] at [phone]). The line is available 24 hours a day, seven days a week. ? ?If you would like help to quit smoking, call 1-800-QUIT-NOW (430)311-9832) OR Espa?ol: 1-855-D?jelo-Ya 463-194-8875) o para m?s informaci?n haga clic aqu? or Text READY to 200-400 to register via text ? ?[name], ? ? ?Please see education materials related to health maintenance provided by MyChart link. ? ?Robert Donovan verbalizes understanding of instructions and care plan provided today and agrees to view in [place]. Active MyChart status confirmed with Robert Donovan.   ? ?Telephone follow up appointment with Managed Medicaid care management team member scheduled for:12/25/21 @ 3:30pm ? ?Lurena Joiner RN, BSN ?[place] ?RN Care Coordinator ? ? ?Following is a copy of your plan of care:  ?Care Plan : [place] of Care  ?Updates made by Melissa Montane, RN since  09/25/2021 12:00 AM  ?  ? ?Problem: Chronic Heatlh Management Needs Related to History of CVA Resolved 09/25/2021  ?  ? ?Long-Range Goal: Development of Plan of Care to address heatlh management needs related to history of CVA Completed 09/25/2021  ?Start Date: 05/14/2021  ?Expected End Date: 09/25/2021  ?Priority: High  ?Note:   ?Current Barriers:  ?Chronic Disease Management support and education needs related to CAD-Robert Donovan's mother/DPR, Reginold Agent reports no needs at this time. Robert Donovan now with [employer]. RNCM will follow up in 3 months to assess for barriers to managing Mr. Tompson's health. ? ?RNCM Clinical Goal(s):  ?Robert Donovan will verbalize understanding of plan for management of CAD as evidenced by verbalization and self monitoring activities ?take all medications exactly as prescribed and will call provider for medication related questions as evidenced by documentation in EMR    ?attend all scheduled medical appointments: PCP on 3/20 and TFC on 08/28/21 as evidenced by provider documentation in EMR        ?continue to work with Consulting civil engineer and/or Social Worker to address care management and care coordination needs related to CAD as evidenced by adherence to CM Team Scheduled appointments     through collaboration with Consulting civil engineer, provider, and care team.  ? ?Interventions: ?Inter-disciplinary care team collaboration (see longitudinal plan of care) ?Evaluation of current treatment plan related to  self management and Robert Donovan's adherence to plan as established by provider ?Advised to discuss questions or concerns with PCP and take all medications to future provider appointments ? ?CAD  (Status: Goal Met.) Long Term Goal  ?Assessed understanding of CAD diagnosis ?Medications reviewed including medications utilized in CAD treatment plan ?Provided education on importance of blood pressure control in  management of CAD; ?Provided education on Importance of limiting foods high in cholesterol; ?Reviewed  Importance of taking all medications as prescribed ?Reviewed Importance of attending all scheduled provider appointments ?Advised to report any changes in symptoms or exercise tolerance ?Assessed social determinant of health barriers;  ? ?Robert Donovan Goals/Self-Care Activities: ?Take medications as prescribed   ?Attend all scheduled provider appointments ?Call pharmacy for medication refills 3-7 days in advance of running out of medications ?Attend church or other social activities ?Call provider office for new concerns or questions  ? ? ?  ?  ?

## 2021-09-25 NOTE — Patient Outreach (Signed)
?Medicaid Managed Care   ?Nurse Care Manager Note ? ?09/25/2021 ?Name:  Robert Donovan MRN:  [id number] DOB:  1955/06/21 ? ?Robert Donovan is an 66 y.o. year old male who is a primary patient of Ladell Pier, MD.  The [facility] Managed Care Coordination team was consulted for assistance with:    ?CAD ? ?[name] was given information about Medicaid Managed Care Coordination team services today. Robert Donovan Release (DPR) agreed to services and verbal consent obtained. ? ?Engaged with patient by telephone for follow up visit in response to provider referral for case management and/or care coordination services.  ? ?Assessments/Interventions:  Review of past medical history, allergies, medications, health status, including review of consultants reports, laboratory and other test data, was performed as part of comprehensive evaluation and provision of chronic care management services. ? ?SDOH (Social Determinants of Health) assessments and interventions performed: ? ? ?Care Plan ? ?Allergies  ?Allergen Reactions  ? Hydrocodone   ?  Pt stated, "I am not allergic, I do not want to take this medicine ever" ?Codeine - "I do not want to ever take this medicine - not allergic"  ? Oxycodone   ?  Pt stated, "I am not allergic, I do not want to take this medicine ever"  ? ? ?Medications Reviewed Today   ? ? Reviewed by Melissa Montane, RN (Registered Nurse) on 09/25/21 at 1506  Med List Status: <None>  ? ?Medication Order Taking? Sig Documenting Provider Last Dose Status Informant  ?amLODipine (NORVASC) 5 MG tablet [id number] No TAKE ONE TABLET BY MOUTH EVERY MORNING Ladell Pier, MD Taking Active   ?aspirin EC 81 MG tablet [id number] No Take 1 tablet (81 mg total) by mouth daily. Ladell Pier, MD Taking Active   ?atorvastatin (LIPITOR) 40 MG tablet [id number] No Take 1 tablet (40 mg total) by mouth every evening. Ladell Pier, MD Taking Active   ?calcium-vitamin D Darron Doom WITH D) 500-200  MG-UNIT tablet [id number] No Take 1 tablet by mouth daily. [provider] Taking Active Self  ?lisinopril-hydrochlorothiazide (ZESTORETIC) 20-25 MG tablet [id number] No TAKE 2 TABLETS BY MOUTH DAILY FOR BLOOD PRESSURE Strength: 20-25 mg Ladell Pier, MD Taking Active   ?Misc. Devices MISC [id number] No Large blood pressure cuff device ?For home blood pressure monitoring Ladell Pier, MD Taking Active   ?Multiple Vitamins-Minerals (MENS MULTIVITAMIN PLUS PO) [id number] No Take 1 tablet by mouth daily. [provider] Taking Active Self  ?vitamin B-12 (CYANOCOBALAMIN) 500 MCG tablet [id number]  TAKE ONE TABLET BY MOUTH EVERY MORNING Ladell Pier, MD  Active   ? ?  ?  ? ?  ? ? ?Patient Active Problem List  ? Diagnosis Date Noted  ? Hemiparesis affecting right side as late effect of cerebrovascular accident (CVA) ([place]) 09/06/2020  ? Personal history of fall 09/06/2020  ? Chronic right shoulder pain 09/06/2020  ? Closed bimalleolar fracture of right ankle 07/27/2020  ? History of loop recorder 05/02/2020  ? Carotid artery disease ([place]) 05/02/2020  ? Right hemiparesis ([place]) 04/30/2020  ? Obesity (BMI 35.0-39.9 without comorbidity) 04/30/2020  ? Hyperlipidemia 04/26/2019  ? Hypokalemia   ? Acute embolic stroke ([place])   ? History of CVA (cerebrovascular accident) 02/11/2018  ? Acute kidney injury superimposed on chronic kidney disease ([place]) 02/11/2018  ? History of syphilis 02/11/2018  ? Constipation 02/11/2018  ? Osteoarthritis 02/11/2018  ? Benign essential HTN 02/11/2018  ? Acute encephalopathy 02/10/2018  ? ? ?Conditions to be  addressed/monitored per PCP order:  CAD ? ?Care Plan : RN Care Manager Plan of Care  ?Updates made by Melissa Montane, RN since 09/25/2021 12:00 AM  ?  ? ?Problem: Chronic Heatlh Management Needs Related to History of CVA Resolved 09/25/2021  ?  ? ?Long-Range Goal: Development of Plan of Care to address heatlh management needs related to history of CVA Completed  09/25/2021  ?Start Date: 05/14/2021  ?Expected End Date: 09/25/2021  ?Priority: High  ?Note:   ?Current Barriers:  ?Chronic Disease Management support and education needs related to CAD-[name]/DPR, Robert Donovan reports no needs at this time. Patient now with [employer]. RNCM will follow up in 3 months to assess for barriers to managing Robert Donovan's health. ? ?RNCM Clinical Goal(s):  ?Patient will verbalize understanding of plan for management of CAD as evidenced by verbalization and self monitoring activities ?take all medications exactly as prescribed and will call provider for medication related questions as evidenced by documentation in EMR    ?attend all scheduled medical appointments: PCP on 3/20 and TFC on 08/28/21 as evidenced by provider documentation in EMR        ?continue to work with Consulting civil engineer and/or Social Worker to address care management and care coordination needs related to CAD as evidenced by adherence to CM Team Scheduled appointments     through collaboration with Consulting civil engineer, provider, and care team.  ? ?Interventions: ?Inter-disciplinary care team collaboration (see longitudinal plan of care) ?Evaluation of current treatment plan related to  self management and patient's adherence to plan as established by provider ?Advised to discuss questions or concerns with PCP and take all medications to future provider appointments ? ?CAD  (Status: Goal Met.) Long Term Goal  ?Assessed understanding of CAD diagnosis ?Medications reviewed including medications utilized in CAD treatment plan ?Provided education on importance of blood pressure control in management of CAD; ?Provided education on Importance of limiting foods high in cholesterol; ?Reviewed Importance of taking all medications as prescribed ?Reviewed Importance of attending all scheduled provider appointments ?Advised to report any changes in symptoms or exercise tolerance ?Assessed social determinant of health barriers;   ? ?Patient Goals/Self-Care Activities: ?Take medications as prescribed   ?Attend all scheduled provider appointments ?Call pharmacy for medication refills 3-7 days in advance of running out of medications ?Attend church or other social activities ?Call provider office for new concerns or questions  ? ? ?  ? ? ?Follow Up:  Patient agrees to Care Plan and Follow-up. ? ?Plan: The Managed Medicaid care management team will reach out to the patient again over the next 90 days. ? ?Date/time of next scheduled RN care management/care coordination outreach:  12/25/21 @ 3:30pm ? ?Lurena Joiner RN, BSN ?[place] ?RN Care Coordinator ? ?

## 2021-09-26 NOTE — Progress Notes (Signed)
Carelink Summary Report / Loop Recorder 

## 2021-10-04 ENCOUNTER — Telehealth: Payer: Self-pay | Admitting: Internal Medicine

## 2021-10-04 NOTE — Telephone Encounter (Signed)
Copied from CRM [id number]. Topic: General - Other ?>> Oct 04, 2021  1:11 PM Randol Kern wrote: ?Reason for CRM: Ray calling from [facility] [employer] called to see if the fax for a bath chair (submitted May2nd) was received. Certificate for medical necessity.  ?[id number]  ? ?Best contact [phone] ?

## 2021-10-09 NOTE — Telephone Encounter (Signed)
Ray called for update on status of request ?

## 2021-10-14 ENCOUNTER — Ambulatory Visit (INDEPENDENT_AMBULATORY_CARE_PROVIDER_SITE_OTHER): Payer: Medicaid Other

## 2021-10-14 DIAGNOSIS — I639 Cerebral infarction, unspecified: Secondary | ICD-10-CM

## 2021-10-17 LAB — CUP PACEART REMOTE DEVICE CHECK
Date Time Interrogation Session: 20230510230511
Implantable Pulse Generator Implant Date: 20201214

## 2021-10-22 ENCOUNTER — Other Ambulatory Visit: Payer: Self-pay | Admitting: Internal Medicine

## 2021-10-22 DIAGNOSIS — I129 Hypertensive chronic kidney disease with stage 1 through stage 4 chronic kidney disease, or unspecified chronic kidney disease: Secondary | ICD-10-CM

## 2021-10-31 NOTE — Progress Notes (Signed)
Carelink Summary Report / Loop Recorder 

## 2021-11-04 ENCOUNTER — Encounter: Payer: Self-pay | Admitting: Internal Medicine

## 2021-11-12 ENCOUNTER — Telehealth: Payer: Self-pay

## 2021-11-12 ENCOUNTER — Encounter: Payer: Self-pay | Admitting: Internal Medicine

## 2021-11-12 NOTE — Telephone Encounter (Signed)
Call placed to patient regarding the denial letter received for PCS with [employer] and he requested that I speak to his mother.   I then called his mother, Robert Donovan. She said she received the denial letter explained that the reason he was denied services was because the person who went to the home to evaluate the patient arrived when she Robert Donovan) was not there. The patient answered "no" to every question about need for assistance. Robert Donovan plans to appeal the denial. She said she also spoke to the representative who completed the evaluation and said that the answers should have all been "yes."  He needs assistance with all ADLs. The representative told her that she would " fix" the evaluation but Robert Donovan has not heard anything more despite having called [employer].  She was not able to find the document needed to submit an appeal and I told her I could email to her. She confirmed her email address: [email or web address].    Robert Donovan requested a copy of his medication list to make sure he is taking the meds correctly even though they are bubble packed.  She also inquired why the medication co-pays are $22/month and they used to be $0. I checked with Butch Penny, [facility] and he said she should all his insurance company with that question and she can also call [facility].  I shared this information with Robert Donovan.   She also explained that she will be sending a CAP application for Dr Laural Benes to complete.  We also discussed the option of the PACE program. It may benefit him by getting him out of the house during the day, administering hie medications and providing any necessary medical care and therapies. In home services may also be provided   She said she would consider that option.

## 2021-11-14 NOTE — Telephone Encounter (Signed)
Call placed to patient's mother, Julieanne Cotton and informed her that Dr Laural Benes completed her portion of the CAP application.  I told her that I would send the application as well as the [employer] denial information via SECURE email to the address she provided. She was very pleased.  I explained that I tried to send via MyChart but the documents would not attach.   She still had questions about the costs if his medications and I reminded her to contact [facility] to get the costs of the meds and I will share that information with our clinical pharmacist to see if he can offer any suggestions to decrease the cost of the meds.  Julieanne Cotton also said that her son is becoming more forgetful and she wanted Dr Laural Benes to be aware.  She said that he forgot his birthday and address when he was questioned by the [employer] examiner at home.

## 2021-11-18 ENCOUNTER — Ambulatory Visit (INDEPENDENT_AMBULATORY_CARE_PROVIDER_SITE_OTHER): Payer: Medicaid Other

## 2021-11-18 DIAGNOSIS — I639 Cerebral infarction, unspecified: Secondary | ICD-10-CM

## 2021-11-21 ENCOUNTER — Other Ambulatory Visit: Payer: Self-pay | Admitting: Internal Medicine

## 2021-11-21 DIAGNOSIS — I10 Essential (primary) hypertension: Secondary | ICD-10-CM

## 2021-11-21 LAB — CUP PACEART REMOTE DEVICE CHECK
Date Time Interrogation Session: 20230612230750
Implantable Pulse Generator Implant Date: 20201214

## 2021-12-04 ENCOUNTER — Ambulatory Visit (INDEPENDENT_AMBULATORY_CARE_PROVIDER_SITE_OTHER): Payer: Medicaid Other | Admitting: Podiatry

## 2021-12-04 ENCOUNTER — Encounter: Payer: Self-pay | Admitting: Podiatry

## 2021-12-04 DIAGNOSIS — B351 Tinea unguium: Secondary | ICD-10-CM

## 2021-12-04 DIAGNOSIS — M79674 Pain in right toe(s): Secondary | ICD-10-CM

## 2021-12-04 DIAGNOSIS — M79675 Pain in left toe(s): Secondary | ICD-10-CM | POA: Diagnosis not present

## 2021-12-04 DIAGNOSIS — D689 Coagulation defect, unspecified: Secondary | ICD-10-CM

## 2021-12-04 DIAGNOSIS — N189 Chronic kidney disease, unspecified: Secondary | ICD-10-CM

## 2021-12-04 DIAGNOSIS — I739 Peripheral vascular disease, unspecified: Secondary | ICD-10-CM

## 2021-12-04 DIAGNOSIS — N179 Acute kidney failure, unspecified: Secondary | ICD-10-CM

## 2021-12-04 NOTE — Progress Notes (Signed)
This patient returns to the office for at risk foot care.  This patient requires this care by a professional since this patient will be at risk due to having swelling, peripheral angiopathy acute kidney disease and symtomatic toenails.    These nails are painful walking and wearing shoes.  This patient presents for at risk foot care today.  General Appearance  Alert, conversant and in no acute stress.  Vascular  Dorsalis pedis palpable and posterior tibial  pulses are non palpable  bilaterally.  Capillary return is within normal limits  bilaterally. Temperature is within normal limits  Bilaterally. Bilateral swelling is present   Neurologic  Senn-Weinstein monofilament wire test within normal limits  bilaterally. Muscle power within normal limits bilaterally.  Nails Thick disfigured discolored nails with subungual debris  from hallux to fifth toes bilaterally. No evidence of bacterial infection or drainage bilaterally.   Orthopedic  No limitations of motion  feet .  No crepitus or effusions noted.  No bony pathology or digital deformities noted.    Skin  normotropic skin with no porokeratosis noted bilaterally.  No signs of infections or ulcers noted.    I  Assessment:  Onychomycosis  Pain in right toes  Pain in left toes, peripheral angiopathy with swelling bilateral.   Plan: Consent was obtained for treatment procedures.   Mechanical debridement of nails 1-5  bilaterally performed with a nail nipper.  Filed with dremel without incident.    Return office visit   3 months.    Told patient to return for periodic foot care and evaluation due to potential at risk complications.  Jamill Wetmore DPM    

## 2021-12-09 NOTE — Progress Notes (Signed)
Carelink Summary Report / Loop Recorder 

## 2021-12-16 ENCOUNTER — Telehealth: Payer: Self-pay | Admitting: Emergency Medicine

## 2021-12-16 NOTE — Telephone Encounter (Signed)
Copied from CRM [id number]. Topic: General - Other >> Dec 16, 2021  2:51 PM Lyman Speller wrote: Reason for CRM: mother called to see if the appeal letter has been sent in / please advise asap

## 2021-12-18 NOTE — Telephone Encounter (Signed)
I called patient's mother and she said she was calling to see if I had the name of the person from [employer] who did the home assessment for PCS  for her son.   She had concerns regarding how the home assessment was done and had spoken to the person who went out to the home but she could not remember her name. I gave her numbers for [employer] and instructed her to ask for Long Term Services and Support: Utilization Management  # [phone] and Member Services # [phone].  She said she would follow up. She also said that she submitted the CAP application as well as the written appeal for PCS.  She confirmed her son's appointment with Dr Laural Benes tomorrow - 12/19/2021 and said she will be accompanying him. She explained that he is requiring a lot more hands on care and need for assistance with ADLs

## 2021-12-18 NOTE — Telephone Encounter (Signed)
Do you all know anything about this?

## 2021-12-19 ENCOUNTER — Ambulatory Visit: Payer: Medicaid Other | Attending: Internal Medicine | Admitting: Internal Medicine

## 2021-12-19 ENCOUNTER — Encounter: Payer: Self-pay | Admitting: Internal Medicine

## 2021-12-19 VITALS — BP 105/69 | HR 68

## 2021-12-19 DIAGNOSIS — I69351 Hemiplegia and hemiparesis following cerebral infarction affecting right dominant side: Secondary | ICD-10-CM | POA: Diagnosis not present

## 2021-12-19 DIAGNOSIS — N289 Disorder of kidney and ureter, unspecified: Secondary | ICD-10-CM | POA: Diagnosis not present

## 2021-12-19 DIAGNOSIS — E782 Mixed hyperlipidemia: Secondary | ICD-10-CM

## 2021-12-19 DIAGNOSIS — I10 Essential (primary) hypertension: Secondary | ICD-10-CM

## 2021-12-19 DIAGNOSIS — L659 Nonscarring hair loss, unspecified: Secondary | ICD-10-CM

## 2021-12-19 DIAGNOSIS — R413 Other amnesia: Secondary | ICD-10-CM | POA: Diagnosis not present

## 2021-12-19 NOTE — Telephone Encounter (Signed)
Noted  

## 2021-12-19 NOTE — Progress Notes (Signed)
Patient ID: Robert Donovan, male    DOB: 13-Oct-1955  MRN: [id number]  CC: Chronic disease management.   Subjective: Robert Donovan is a 66 y.o. male who presents for chronic ds management.  Mom is with him His concerns today include:  Pt with hx of HTN, HL, multifocal LT sided embolic strokes (RT sided weakness lower>upper) loop recorder in place, PVD,  history of syphilis treated by ID Dr. Gale Journey, knee arthritis, obesity.    On last visit, we submitted referral to try to get PCS services for him based on 1 skilled need.  [employer] went out to the home to do an assessment for PCS services.  Patient answered no to every question about needing assistance until it was denied.  Mother was upset by this.  She felt the answers should have been yes and that the pt has memory issues and lack insight into his needs.  She says his short term memory is not as good as before.  She is in the process of appealing the case.  She has applied for CAP for him We had referred him for some more physical therapy but when he was called in April, patient declined appointment at that time because he was having some pain in his left foot.. -Insurance was changed back to Medicaid.  He currently has a motorized wheelchair that is on loan from [employer] -I asked today what does he need help with at home.  She tells me that he bathes himself, does his cooking, toileting, closing and transfers independently. Pt endorses what she said.  He needs help with light housekeeping like mopping the floor and putting the sheets on his bed.  He needs someone to put up his groceries and take out his trash.  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  HTN: Patient denies any chest pains or shortness of breath.  Compliant with taking lisinopril/HCTZ 20/25 mg 2 tablets daily and Norvasc. Taking and tolerating atorvastatin 40 mg daily. Last  chemistry revealed decrease in GFR to 58 and creatinine of 1.38. NSAID d/c  Saw Dr. Lorin Mercy in follow-up in April.  Right ankle has healed but with varus deformity   Patient Active Problem List   Diagnosis Date Noted   Hemiparesis affecting right side as late effect of cerebrovascular accident (CVA) ([place]) 09/06/2020   Personal history of fall 09/06/2020   Chronic right shoulder pain 09/06/2020   Closed bimalleolar fracture of right ankle 07/27/2020   History of loop recorder 05/02/2020   Carotid artery disease ([place]) 05/02/2020   Right hemiparesis ([place]) 04/30/2020   Obesity (BMI 35.0-39.9 without comorbidity) 04/30/2020   Hyperlipidemia 04/26/2019   Hypokalemia    Acute embolic stroke ([place])    History of CVA (cerebrovascular accident) 02/11/2018   Acute kidney injury superimposed on chronic kidney disease ([place]) 02/11/2018   History of syphilis 02/11/2018   Constipation 02/11/2018   Osteoarthritis 02/11/2018   Benign essential HTN 02/11/2018   Acute encephalopathy 02/10/2018     Current Outpatient Medications on File Prior to Visit  Medication Sig Dispense Refill   amLODipine (NORVASC) 5 MG tablet TAKE ONE TABLET BY MOUTH EVERY MORNING 90 tablet 1   aspirin EC (ASPIRIN LOW DOSE) 81 MG tablet TAKE ONE TABLET BY MOUTH EVERY MORNING 90 tablet 0   atorvastatin (LIPITOR) 40 MG tablet Take 1 tablet (40 mg total) by mouth every  evening. 90 tablet 3   calcium-vitamin D (OSCAL WITH D) 500-200 MG-UNIT tablet Take 1 tablet by mouth daily.     lisinopril-hydrochlorothiazide (ZESTORETIC) 20-25 MG tablet TAKE TWO TABLETS BY MOUTH ONCE DAILY FOR BLOOD PRESSURE 60 tablet 2   Multiple Vitamins-Minerals (MENS MULTIVITAMIN PLUS PO) Take 1 tablet by mouth daily.     vitamin B-12 (CYANOCOBALAMIN) 500 MCG tablet TAKE ONE TABLET BY MOUTH EVERY MORNING 100 tablet 2   Misc. Devices MISC Large blood pressure cuff device For home blood pressure monitoring 1 Device 0   No current facility-administered  medications on file prior to visit.    Allergies  Allergen Reactions   Hydrocodone     Pt stated, "I am not allergic, I do not want to take this medicine ever" Codeine - "I do not want to ever take this medicine - not allergic"   Oxycodone     Pt stated, "I am not allergic, I do not want to take this medicine ever"    Social History   Socioeconomic History   Marital status: Single    Spouse name: Not on file   Number of children: Not on file   Years of education: Not on file   Highest education level: Not on file  Occupational History   Not on file  Tobacco Use   Smoking status: Former   Smokeless tobacco: Never  Vaping Use   Vaping Use: Never used  Substance and Sexual Activity   Alcohol use: Not Currently   Drug use: Not Currently    Comment: hx marijuana use   Sexual activity: Not Currently    Partners: Female    Birth control/protection: None    Comment: last encounter 2011  Other Topics Concern   Not on file  Social History Narrative   Not on file   Social Determinants of Health   Financial Resource Strain: Not on file  Food Insecurity: No Food Insecurity (07/24/2021)   Hunger Vital Sign    Worried About Running Out of Food in the Last Year: Never true    Ran Out of Food in the Last Year: Never true  Transportation Needs: No Transportation Needs (08/26/2021)   PRAPARE - Hydrologist (Medical): No    Lack of Transportation (Non-Medical): No  Physical Activity: Not on file  Stress: Not on file  Social Connections: Moderately Integrated (03/08/2021)   Social Connection and Isolation Panel [NHANES]    Frequency of Communication with Friends and Family: More than three times a week    Frequency of Social Gatherings with Friends and Family: More than three times a week    Attends Religious Services: More than 4 times per year    Active Member of [employer] or Organizations: Yes    Attends Archivist Meetings: Never    Marital  Status: Never married  Human resources officer Violence: Not on file    Family History  Problem Relation Age of Onset   Healthy Mother    Heart attack Father    Diabetes Brother     Past Surgical History:  Procedure Laterality Date   LOOP RECORDER IMPLANT  05/16/2019   TEE WITHOUT CARDIOVERSION N/A 02/16/2018   Procedure: TRANSESOPHAGEAL ECHOCARDIOGRAM (TEE);  Surgeon: Buford Dresser, MD;  Location: [facility] ENDOSCOPY;  Service: Cardiovascular;  Laterality: N/A;   TOOTH EXTRACTION Bilateral 10/19/2020   Procedure: DENTAL RESTORATION/EXTRACTIONS;  Surgeon: Diona Browner, DMD;  Location: [place];  Service: Oral Surgery;  Laterality: Bilateral;  ROS: Review of Systems Skin: Mother reports that he is losing hair at the top of his head and also at the back for quite some time.  She states that he has patches of hair loss at the back of the head.  PHYSICAL EXAM: BP 105/69   Pulse 68   SpO2 99%   Physical Exam  General appearance - alert, well appearing, older African-American male and in no distress.  Pt in motorized wheelchair Mental status -patient is alert and oriented.  He is able to correctly give me the day of the week the month and the year. Neck - supple, no significant adenopathy Chest - clear to auscultation, no wheezes, rales or rhonchi, symmetric air entry Heart - normal rate, regular rhythm, normal S1, S2, no murmurs, rubs, clicks or gallops Extremities -no pitting lower extremity edema. Hair: Patient has male pattern baldness.  I do not appreciate any patches of hair loss at the back of the head.    12/19/2021   12:46 PM 12/19/2021   12:24 PM  MMSE - Mini Mental State Exam  Orientation to time 5 5  Orientation to Place 5 1  Registration 3 3  Attention/ Calculation 3 3  Recall 2 2  Language- name 2 objects 2 2  Language- repeat 1 1  Language- follow 3 step command 3 3  Language- read & follow direction 1 1  Write a sentence 1 1  Copy design 1 1  Total score 27 23    MiniCog - scored 4/5 (was able to draw a clock and correctly set the time.  Was able to recall 2 out of 3 items on recall testing)     Latest Ref Rng & Units 08/19/2021    3:24 PM 10/19/2020    8:53 AM 02/02/2020    3:35 PM  CMP  Glucose 70 - 99 mg/dL 92  95  87   BUN 8 - 27 mg/dL _0 Creatinine 0.76 - 1.27 mg/dL 1.36  1.10  1.21   Sodium 134 - 144 mmol/L 142  140  142   Potassium 3.5 - 5.2 mmol/L 3.9  3.7  4.1   Chloride 96 - 106 mmol/L 102  104  105   CO2 20 - 29 mmol/L 24   24   Calcium 8.6 - 10.2 mg/dL 10.1   9.7   Total Protein 6.0 - 8.5 g/dL 7.0   7.2   Total Bilirubin 0.0 - 1.2 mg/dL 0.8   1.0   Alkaline Phos 44 - 121 IU/L 98   97   AST 0 - 40 IU/L 10   14   ALT 0 - 44 IU/L 7   8    Lipid Panel     Component Value Date/Time   CHOL 147 08/19/2021 1524   TRIG 102 08/19/2021 1524   HDL 43 08/19/2021 1524   CHOLHDL 3.4 08/19/2021 1524   CHOLHDL 4.3 02/11/2018 0345   VLDL 16 02/11/2018 0345   LDLCALC 85 08/19/2021 1524    CBC    Component Value Date/Time   WBC 8.2 08/19/2021 1524   WBC 10.4 02/11/2018 0345   RBC 5.36 08/19/2021 1524   RBC 5.06 02/11/2018 0345   HGB 15.6 08/19/2021 1524   HCT 47.2 08/19/2021 1524   PLT 213 08/19/2021 1524   MCV 88 08/19/2021 1524   MCH 29.1 08/19/2021 1524   MCH 28.5 02/11/2018 0345   MCHC 33.1 08/19/2021 1524   MCHC 32.4  02/11/2018 0345   RDW 12.8 08/19/2021 1524   LYMPHSABS 2.8 02/02/2020 1535   MONOABS 0.7 02/11/2018 0345   EOSABS 0.2 02/02/2020 1535   BASOSABS 0.1 02/02/2020 1535    ASSESSMENT AND PLAN: 1. Essential hypertension At goal.  Continue current medications listed above - Basic Metabolic Panel - Microalbumin / creatinine urine ratio  2. Hemiparesis affecting right side as late effect of cerebrovascular accident (CVA) [facility]) -Informed the patient and his mother that Medicaid covers for [facility] services if at least 2 out of 5 skilled needs are met.  At this time based on history he does not seem to have  a skilled need and Medicaid may not cover for just housekeeping needs. Other options for him would be CAP or PACE -Mother reports that patient is better but states that even when he was at his worse we were not giving him the help that he needed in his home.  I reminded her that he was at one time getting PCS services.  She feels she is at her wits end and trying to get him the help that he needs at home. I told her that I will speak with Opal Sidles our [place] and have her touch base with them next wk  3. Poor short term memory Mini cog and Mini-Mental exam done today. Pt's scores were acceptable.   Recordings are as above. I made a mistake in the initial tally when recording his score for orientation to place.  He scored a 5 but I accidentally had placed a 1 bringing his initial score to 23 when in fact it was 27.  I later called patient and left a message on his voicemail informing him of this.  I also called and spoke to his mother also to let her know. -Now that we have a baseline, we can repeat testing in 6 months to see whether things are stable or get worse.  4. Renal insufficiency Avoid NSAIDs.  We will recheck BMP today.  5. Mixed hyperlipidemia Continue atorvastatin.  6. Hair loss Looks like male pattern baldness but mother states that he is also losing patches at the back of the head which I do not appreciate on exam but I will submit a referral to dermatology. - Ambulatory referral to Dermatology    Patient was given the opportunity to ask questions.  Patient verbalized understanding of the plan and was able to repeat key elements of the plan.   This documentation was completed using Radio producer.  Any transcriptional errors are unintentional.  Orders Placed This Encounter  Procedures   Basic Metabolic Panel   Microalbumin / creatinine urine ratio     Requested Prescriptions    No prescriptions requested or ordered in this encounter    Return in about 4  months (around 04/21/2022).  Karle Plumber, MD, FACP

## 2021-12-20 LAB — BASIC METABOLIC PANEL
BUN/Creatinine Ratio: 12 (ref 10–24)
BUN: 17 mg/dL (ref 8–27)
CO2: 24 mmol/L (ref 20–29)
Calcium: 9.8 mg/dL (ref 8.6–10.2)
Chloride: 103 mmol/L (ref 96–106)
Creatinine, Ser: 1.39 mg/dL — ABNORMAL HIGH (ref 0.76–1.27)
Glucose: 86 mg/dL (ref 70–99)
Potassium: 4 mmol/L (ref 3.5–5.2)
Sodium: 142 mmol/L (ref 134–144)
eGFR: 57 mL/min/{1.73_m2} — ABNORMAL LOW (ref >59)

## 2021-12-20 LAB — MICROALBUMIN / CREATININE URINE RATIO
Creatinine, Urine: 163.9 mg/dL
Microalb/Creat Ratio: <2 mg/g creat (ref 0–29)
Microalbumin, Urine: <3 ug/mL

## 2021-12-23 ENCOUNTER — Ambulatory Visit (INDEPENDENT_AMBULATORY_CARE_PROVIDER_SITE_OTHER): Payer: Medicaid Other

## 2021-12-23 DIAGNOSIS — I639 Cerebral infarction, unspecified: Secondary | ICD-10-CM | POA: Diagnosis not present

## 2021-12-24 LAB — CUP PACEART REMOTE DEVICE CHECK
Date Time Interrogation Session: 20230715230921
Implantable Pulse Generator Implant Date: 20201214

## 2021-12-25 ENCOUNTER — Other Ambulatory Visit: Payer: Self-pay | Admitting: *Deleted

## 2021-12-25 NOTE — Patient Instructions (Signed)
Visit Information  Mr. Luhmann's DPR/mother was given information about Medicaid Managed Care team care coordination services as a part of their [employer] Medicaid benefit. Perley Jain DPR/mother verbally consentedto engagement with the [facility] Managed Care team.   If you are experiencing a medical emergency, please call 911 or report to your local emergency department or urgent care.   If you have a non-emergency medical problem during routine business hours, please contact your provider's office and ask to speak with a nurse.   For questions related to your [employer] [facility] health plan, please call: [phone]  OR visit the member homepage at: [email or web address]  If you would like to schedule transportation through your [place] plan, please call the following number at least 2 days in advance of your appointment: [phone]  If you are experiencing a behavioral health crisis, call the [facility] [facility] Crisis Line at [phone]). The line is available 24 hours a day, seven days a week.  If you would like help to quit smoking, call 1-800-QUIT-NOW (904-267-2614) OR Espaol: 1-855-Djelo-Ya (8-341-962-2297) o para ms informacin haga clic aqu or Text READY to 989-211 to register via text  [name],   Please see education materials related to fall prevention provided by MyChart link.  Patient verbalizes understanding of instructions and care plan provided today and agrees to view in MyChart. Active MyChart status and patient understanding of how to access instructions and care plan via MyChart confirmed with patient.     Telephone follow up appointment with Managed Medicaid care management team member scheduled for:02/25/22 @ 3:30pm  Estanislado Emms RN, BSN [place]  [facility] RN Care Coordinator   Following is a copy of  your plan of care:  Care Plan : RN Care Manager Plan of Care  Updates made by [name], RN since 12/25/2021 12:00 AM     Problem: CHL AMB "PATIENT-SPECIFIC PROBLEM"      Long-Range Goal: Development of Plan of Care to address Health Management needs related to level of care   Start Date: 12/25/2021  Expected End Date: 02/23/2022  Note:   Current Barriers:  Care Coordination needs related to Level of care concerns   RNCM Clinical Goal(s):  Patient will verbalize understanding of plan for management of level of care as evidenced by patient/parent reports attend all scheduled medical appointments: 01/01/22 with Dr. Ophelia Charter as evidenced by provider documentation in EMR           Interventions: Inter-disciplinary care team collaboration (see longitudinal plan of care) Evaluation of current treatment plan related to  self management and patient's adherence to plan as established by provider   Level of Care  (Status: New goal.) Long Term Goal  Evaluation of current treatment plan related to  level of care , Level of care concerns self-management and patient's adherence to plan as established by provider. Discussed plans with patient for ongoing care management follow up and provided patient with direct contact information for care management team Advised patient to contact Medicaid Ombudsman [phone] for concerns regarding provided services; Reviewed scheduled/upcoming provider appointments including 01/01/22 with Dr. Ophelia Charter; Discussed plans with patient for ongoing care management follow up and provided patient with direct contact information for care management team; Reviewed provider notes and discussed Provided contact information for [employer] Long Term Services and Support, Utilization Management [phone] and Member Services [phone], as provided by PCP office  Patient Goals/Self-Care Activities: Take medications as prescribed   Attend all  scheduled provider  appointments Call provider office for new concerns or questions  Contact [employer] with questions regarding covered services

## 2021-12-25 NOTE — Patient Outreach (Signed)
Medicaid Managed Care   Nurse Care Manager Note  12/25/2021 Name:  Robert Donovan MRN:  [id number] DOB:  Jul 12, 1955  Robert Donovan is an 66 y.o. year old male who is a primary patient of Robert Matar, MD.  The [facility] Managed Care Coordination team was consulted for assistance with:    Level of Care  Robert Donovan was given information about Medicaid Managed Care Coordination team services today. Robert Donovan agreed to services and verbal consent obtained.  Engaged with patient by telephone for follow up visit in response to provider referral for case management and/or care coordination services.   Assessments/Interventions:  Review of past medical history, allergies, medications, health status, including review of consultants reports, laboratory and other test data, was performed as part of comprehensive evaluation and provision of chronic care management services.  SDOH (Social Determinants of Health) assessments and interventions performed:   Care Plan  Allergies  Allergen Reactions   Hydrocodone     Pt stated, "I am not allergic, I do not want to take this medicine ever" Codeine - "I do not want to ever take this medicine - not allergic"   Oxycodone     Pt stated, "I am not allergic, I do not want to take this medicine ever"    Medications Reviewed Today     Reviewed by Robert Dach, RN (Registered Nurse) on 12/25/21 at 1547  Med List Status: <None>   Medication Order Taking? Sig Documenting Provider Last Dose Status Informant  amLODipine (NORVASC) 5 MG tablet [id number] No TAKE ONE TABLET BY MOUTH EVERY MORNING Robert Matar, MD Taking Active   aspirin EC (ASPIRIN LOW DOSE) 81 MG tablet [id number] No TAKE ONE TABLET BY MOUTH EVERY MORNING Robert Matar, MD Taking Active   atorvastatin (LIPITOR) 40 MG tablet [id number] No Take 1 tablet (40 mg total) by mouth every evening. Robert Matar, MD Taking Active   calcium-vitamin D (OSCAL WITH D) 500-200  MG-UNIT tablet [id number] No Take 1 tablet by mouth daily. [provider] Taking Active Self  lisinopril-hydrochlorothiazide (ZESTORETIC) 20-25 MG tablet [id number] No TAKE TWO TABLETS BY MOUTH ONCE DAILY FOR BLOOD PRESSURE Robert Matar, MD Taking Active   Misc. Devices MISC [id number] No Large blood pressure cuff device For home blood pressure monitoring Robert Matar, MD Taking Active   Multiple Vitamins-Minerals (MENS MULTIVITAMIN PLUS PO) [id number] No Take 1 tablet by mouth daily. [provider] Taking Active Self  vitamin B-12 (CYANOCOBALAMIN) 500 MCG tablet [id number] No TAKE ONE TABLET BY MOUTH EVERY MORNING Robert Matar, MD Taking Active             Patient Active Problem List   Diagnosis Date Noted   Hemiparesis affecting right side as late effect of cerebrovascular accident (CVA) (HCC) 09/06/2020   Personal history of fall 09/06/2020   Chronic right shoulder pain 09/06/2020   Closed bimalleolar fracture of right ankle 07/27/2020   History of loop recorder 05/02/2020   Carotid artery disease (HCC) 05/02/2020   Right hemiparesis (HCC) 04/30/2020   Obesity (BMI 35.0-39.9 without comorbidity) 04/30/2020   Hyperlipidemia 04/26/2019   Hypokalemia    Acute embolic stroke (HCC)    History of CVA (cerebrovascular accident) 02/11/2018   Acute kidney injury superimposed on chronic kidney disease (HCC) 02/11/2018   History of syphilis 02/11/2018   Constipation 02/11/2018   Osteoarthritis 02/11/2018   Benign essential HTN 02/11/2018   Acute encephalopathy 02/10/2018    Conditions to be addressed/monitored per  PCP order:   level of care  Care Plan : RN Care Manager Plan of Care  Updates made by Robert Dach, RN since 12/25/2021 12:00 AM     Problem: CHL AMB "PATIENT-SPECIFIC PROBLEM"      Long-Range Goal: Development of Plan of Care to address Health Management needs related to level of care   Start Date: 12/25/2021  Expected End Date:  02/23/2022  Note:   Current Barriers:  Care Coordination needs related to Level of care concerns   RNCM Clinical Goal(s):  Patient will verbalize understanding of plan for management of level of care as evidenced by patient/Donovan reports attend all scheduled medical appointments: 01/01/22 with Dr. Ophelia Charter as evidenced by provider documentation in EMR           Interventions: Inter-disciplinary care team collaboration (see longitudinal plan of care) Evaluation of current treatment plan related to  self management and patient's adherence to plan as established by provider   Level of Care  (Status: New goal.) Long Term Goal  Evaluation of current treatment plan related to  level of care , Level of care concerns self-management and patient's adherence to plan as established by provider. Discussed plans with patient for ongoing care management follow up and provided patient with direct contact information for care management team Advised patient to contact Medicaid Ombudsman [phone] for concerns regarding provided services; Reviewed scheduled/upcoming provider appointments including 01/01/22 with Dr. Ophelia Charter; Discussed plans with patient for ongoing care management follow up and provided patient with direct contact information for care management team; Reviewed provider notes and discussed Provided contact information for [employer] Long Term Services and Support, Utilization Management [phone] and Member Services [phone], as provided by PCP office  Patient Goals/Self-Care Activities: Take medications as prescribed   Attend all scheduled provider appointments Call provider office for new concerns or questions  Contact [employer] with questions regarding covered services       Follow Up:  Patient agrees to Care Plan and Follow-up.  Plan: The Managed Medicaid care management team will reach out to the patient again over the next 60 days.  Date/time of next scheduled RN  care management/care coordination outreach:  02/25/22 @ 3:30pm  Robert Emms RN, BSN [place]  [facility] RN Care Coordinator

## 2022-01-01 ENCOUNTER — Ambulatory Visit (INDEPENDENT_AMBULATORY_CARE_PROVIDER_SITE_OTHER): Payer: Medicaid Other | Admitting: Orthopaedic Surgery

## 2022-01-01 ENCOUNTER — Encounter: Payer: Self-pay | Admitting: Orthopaedic Surgery

## 2022-01-01 VITALS — BP 112/66 | HR 55

## 2022-01-01 DIAGNOSIS — R29898 Other symptoms and signs involving the musculoskeletal system: Secondary | ICD-10-CM | POA: Diagnosis not present

## 2022-01-01 DIAGNOSIS — I69351 Hemiplegia and hemiparesis following cerebral infarction affecting right dominant side: Secondary | ICD-10-CM | POA: Diagnosis not present

## 2022-01-01 NOTE — Progress Notes (Unsigned)
   Office Visit Note   Patient: Robert Donovan           Date of Birth: 12/12/1955           MRN: [id number] Visit Date: 01/01/2022              Requested by: [name], MD [address] [place] [place],  [place] [address] PCP: [name], MD   Assessment & Plan: Visit Diagnoses: No diagnosis found.  Plan: ***  Follow-Up Instructions: No follow-ups on file.   Orders:  No orders of the defined types were placed in this encounter.  No orders of the defined types were placed in this encounter.     Procedures: No procedures performed   Clinical Data: No additional findings.   Subjective: Chief Complaint  Patient presents with   Right Ankle - Follow-up    HPI  Review of Systems   Objective: Vital Signs: BP 112/66   Pulse (!) 55   Physical Exam  Ortho Exam  Specialty Comments:  No specialty comments available.  Imaging: No results found.   PMFS History: Patient Active Problem List   Diagnosis Date Noted   Hemiparesis affecting right side as late effect of cerebrovascular accident (CVA) (HCC) 09/06/2020   Personal history of fall 09/06/2020   Chronic right shoulder pain 09/06/2020   Closed bimalleolar fracture of right ankle 07/27/2020   History of loop recorder 05/02/2020   Carotid artery disease (HCC) 05/02/2020   Right hemiparesis (HCC) 04/30/2020   Obesity (BMI 35.0-39.9 without comorbidity) 04/30/2020   Hyperlipidemia 04/26/2019   Hypokalemia    Acute embolic stroke (HCC)    History of CVA (cerebrovascular accident) 02/11/2018   Acute kidney injury superimposed on chronic kidney disease (HCC) 02/11/2018   History of syphilis 02/11/2018   Constipation 02/11/2018   Osteoarthritis 02/11/2018   Benign essential HTN 02/11/2018   Acute encephalopathy 02/10/2018   Past Medical History:  Diagnosis Date   Arthritis    Bilateral Knees   High cholesterol    History of loop recorder 05/16/2019   Hypertension    Stroke (HCC)  02/10/2018    Family History  Problem Relation Age of Onset   Healthy Mother    Heart attack Father    Diabetes Brother     Past Surgical History:  Procedure Laterality Date   LOOP RECORDER IMPLANT  05/16/2019   TEE WITHOUT CARDIOVERSION N/A 02/16/2018   Procedure: TRANSESOPHAGEAL ECHOCARDIOGRAM (TEE);  Surgeon: Jodelle Red, MD;  Location: [facility] ENDOSCOPY;  Service: Cardiovascular;  Laterality: N/A;   TOOTH EXTRACTION Bilateral 10/19/2020   Procedure: DENTAL RESTORATION/EXTRACTIONS;  Surgeon: Ocie Doyne, DMD;  Location: [facility] OR;  Service: Oral Surgery;  Laterality: Bilateral;   Social History   Occupational History   Not on file  Tobacco Use   Smoking status: Former   Smokeless tobacco: Never  Building services engineer Use: Never used  Substance and Sexual Activity   Alcohol use: Not Currently   Drug use: Not Currently    Comment: hx marijuana use   Sexual activity: Not Currently    Partners: Female    Birth control/protection: None    Comment: last encounter 2011

## 2022-01-02 ENCOUNTER — Telehealth: Payer: Self-pay

## 2022-01-02 DIAGNOSIS — R29898 Other symptoms and signs involving the musculoskeletal system: Secondary | ICD-10-CM | POA: Insufficient documentation

## 2022-01-02 NOTE — Telephone Encounter (Signed)
At request of Dr Laural Benes, I called patient's mother, Robert Donovan, to follow up from patient's appointment with Dr Laural Benes on 12/20/18.  Robert Donovan's daughter in law answered and said [name] is out of town at a convention and will not be back until 01/06/2022. She will leave a message to have [name] return my call

## 2022-01-07 ENCOUNTER — Telehealth: Payer: Self-pay | Admitting: Emergency Medicine

## 2022-01-07 NOTE — Telephone Encounter (Signed)
Copied from CRM [id number]. Topic: General - Other >> Jan 06, 2022  3:16 PM Ja-Kwan M wrote: Reason for CRM: [name] stated she was returning call to [facility]. She requests Erskine Squibb return her call at either [phone] or [phone]

## 2022-01-08 NOTE — Telephone Encounter (Signed)
I returned the call to patient's mother, [name].  She is upset that despite the patient having 2 strokes with left sided weakness,  Dr Laural Benes does not feel that he needs personal care services.  She said he had the services before and does not understand why he doesn't qualify now.  Julieanne Cotton explained that she provides all needed support for her son- caring for him and his apartment.  She said he will not ask for help.  He is on the wait list for CAP.    She has not heard back from [employer] about the appeal for PCS. We discussed the PACE program and Julieanne Cotton said that she has already been in contact with [facility], they came out to the house.  She does not want to pursue anything with them at this time for fear that if he is turned down for their services, he may not be eligible for anything.  I tried to assure her that she can contact [facility] to discuss their services further but she does not want to do that at this time.   She said that Dr Laural Benes called her and told her that " something was wrong, there was an error" she is not sure what this is in reference to. Julieanne Cotton also said Dr Laural Benes told her " not to do anything."  Julieanne Cotton would like a call back from Dr Laural Benes to clarify what the call was referencing.  The best number to reach Julieanne Cotton is [phone].

## 2022-01-12 NOTE — Telephone Encounter (Signed)
noted 

## 2022-01-21 ENCOUNTER — Other Ambulatory Visit: Payer: Self-pay | Admitting: Internal Medicine

## 2022-01-21 DIAGNOSIS — I129 Hypertensive chronic kidney disease with stage 1 through stage 4 chronic kidney disease, or unspecified chronic kidney disease: Secondary | ICD-10-CM

## 2022-01-27 ENCOUNTER — Ambulatory Visit (INDEPENDENT_AMBULATORY_CARE_PROVIDER_SITE_OTHER): Payer: Medicaid Other

## 2022-01-27 DIAGNOSIS — I639 Cerebral infarction, unspecified: Secondary | ICD-10-CM

## 2022-01-28 LAB — CUP PACEART REMOTE DEVICE CHECK
Date Time Interrogation Session: 20230817230959
Implantable Pulse Generator Implant Date: 20201214

## 2022-01-31 NOTE — Progress Notes (Signed)
Carelink Summary Report / Loop Recorder 

## 2022-02-16 ENCOUNTER — Other Ambulatory Visit: Payer: Self-pay | Admitting: Internal Medicine

## 2022-02-16 DIAGNOSIS — I129 Hypertensive chronic kidney disease with stage 1 through stage 4 chronic kidney disease, or unspecified chronic kidney disease: Secondary | ICD-10-CM

## 2022-02-17 NOTE — Telephone Encounter (Signed)
Requested Prescriptions  Pending Prescriptions Disp Refills  . amLODipine (NORVASC) 5 MG tablet [Pharmacy Med Name: amlodipine 5 mg tablet] 90 tablet 1    Sig: TAKE ONE TABLET BY MOUTH EVERY MORNING     Cardiovascular: Calcium Channel Blockers 2 Passed - 02/16/2022  8:04 AM      Passed - Last BP in normal range    BP Readings from Last 1 Encounters:  01/01/22 112/66         Passed - Last Heart Rate in normal range    Pulse Readings from Last 1 Encounters:  01/01/22 (!) 55         Passed - Valid encounter within last 6 months    Recent Outpatient Visits          2 months ago Essential hypertension   [place], Dalbert Batman, MD   6 months ago Essential hypertension   [place] Plumber B, MD   11 months ago Hemiparesis affecting right side as late effect of cerebrovascular accident (CVA) [facility])   [place], PA-C   1 year ago Hemiparesis affecting right side as late effect of cerebrovascular accident (CVA) ([place])   [place], Neoma Laming B, MD   1 year ago Hemiparesis affecting right side as late effect of cerebrovascular accident (CVA) [facility])   [facility] Emery, Dalbert Batman, MD

## 2022-02-21 NOTE — Progress Notes (Signed)
Carelink Summary Report / Loop Recorder 

## 2022-02-25 ENCOUNTER — Ambulatory Visit: Payer: Medicaid Other

## 2022-02-27 ENCOUNTER — Other Ambulatory Visit: Payer: Self-pay | Admitting: *Deleted

## 2022-02-27 NOTE — Patient Instructions (Addendum)
Visit Information  [name]'s DPR was given information about Medicaid Managed Care team care coordination services as a part of their [place] Medicaid benefit. Robert Donovan DPR verbally consentedto engagement with the [facility] Managed Care team.   If you are experiencing a Donovan emergency, please call 911 or report to your local emergency department or urgent care.   If you have a non-emergency Donovan problem during routine business hours, please contact your provider's office and ask to speak with a nurse.   For questions related to your [employer] [facility] health plan, please call: [phone]  OR visit the member homepage at: [email or web address]  If you would like to schedule transportation through your [facility] plan, please call the following number at least 2 days in advance of your appointment: [phone]  If you are experiencing a behavioral health crisis, call the [place] at [phone]). The line is available 24 hours a day, seven days a week.  If you would like help to quit smoking, call 1-800-QUIT-NOW 636-707-8447) OR Espaol: 1-855-Djelo-Ya (2-595-638-7564) o para ms informacin haga clic aqu or Text READY to 200-400 to register via text  [name],   Please see education materials related to health maintenance provided by MyChart link.  Patient verbalizes understanding of instructions and care plan provided today and agrees to view in [place]. Active MyChart status and patient understanding of how to access instructions and care plan via MyChart confirmed with patient.     No further follow up required:   no follow up  [place], [place] RN Care Coordinator   Following is a copy of your plan of care:  Care Plan : [place] of Care  Updates made  by Robert Montane, RN since 02/27/2022 12:00 AM     Problem: CHL AMB "PATIENT-SPECIFIC PROBLEM"      Long-Range Goal: Development of Plan of Care to address Health Management needs related to level of care   Start Date: 12/25/2021  Expected End Date: 02/23/2022  Note:   Current Barriers:  Care Coordination needs related to Level of care concerns [name]/Robert Donovan reports change in PCP to Dr. Basilio Donovan at [facility]. She is pleased with the new provider. Robert Donovan will start PT next week. She denies any needs at this time.  RNCM Clinical Goal(s):  Patient will verbalize understanding of plan for management of level of care as evidenced by patient/parent reports attend all scheduled Donovan appointments: 01/01/22 with Dr. Lorin Donovan as evidenced by provider documentation in EMR           Interventions: Inter-disciplinary care team collaboration (see longitudinal plan of care) Evaluation of current treatment plan related to  self management and patient's adherence to plan as established by provider   Level of Care  (Status: Goal Met.) Long Term Goal  Evaluation of current treatment plan related to  level of care , Level of care concerns self-management and patient's adherence to plan as established by provider. Discussed plans with patient for ongoing care management follow up and provided patient with direct contact information for care management team Reviewed scheduled/upcoming provider appointments including PT starting next week, new PCP provider-Robert Donovan, Dr. Basilio Donovan; Discussed plans with patient for ongoing care management follow up and provided patient with direct contact information for care management team; PT evaluation, will start treatment next week Provided patient's mother with [place] [phone] and reviewed  member benefits  Patient Goals/Self-Care Activities: Take medications as prescribed   Attend all scheduled provider  appointments Call provider office for new concerns or questions  Contact [employer] with questions regarding covered services

## 2022-02-27 NOTE — Patient Outreach (Signed)
Medicaid Managed Care   Nurse Care Manager Note  02/27/2022 Name:  Robert Donovan MRN:  [id number] DOB:  [birth year]  Robert Donovan is an 66 y.o. year old male who is a primary patient of Robert Pier, MD.  The [place] team was consulted for assistance with:    Level of Care  Mr. Geier was given information about Medicaid Managed Care Coordination team services today. Robert Donovan Release (DPR) agreed to services and verbal consent obtained.  Engaged with patient by telephone for follow up visit in response to provider referral for case management and/or care coordination services.   Assessments/Interventions:  Review of past medical history, allergies, medications, health status, including review of consultants reports, laboratory and other test data, was performed as part of comprehensive evaluation and provision of chronic care management services.  SDOH (Social Determinants of Health) assessments and interventions performed: SDOH Interventions    Flowsheet Row Patient Outreach Telephone from 08/26/2021 in [place] Patient Outreach Telephone from 07/24/2021 in [place] Patient Outreach Telephone from 05/14/2021 in [place] Patient Outreach Telephone from 03/08/2021 in [place] Patient Outreach Telephone from 01/28/2021 in [place] Interventions       Food Insecurity Interventions -- Intervention Not Indicated -- Intervention Not Indicated --  Housing Interventions Intervention Not Indicated Intervention Not Indicated -- Intervention Not Indicated --  Transportation Interventions Intervention Not Indicated -- Intervention Not Indicated -- Intervention Not Indicated  Social Connections Interventions -- -- -- Intervention Not  Indicated --       Care Plan  Allergies  Allergen Reactions   Hydrocodone     Pt stated, "I am not allergic, I do not want to take this medicine ever" Codeine - "I do not want to ever take this medicine - not allergic"   Oxycodone     Pt stated, "I am not allergic, I do not want to take this medicine ever"    Medications Reviewed Today     Reviewed by Robert Donovan, RT (Technologist) on 01/01/22 at [place] List Status: <None>   Medication Order Taking? Sig Documenting Provider Last Dose Status Informant  amLODipine (NORVASC) 5 MG tablet [id number] No TAKE ONE TABLET BY MOUTH EVERY MORNING Robert Pier, MD Taking Active   aspirin EC (ASPIRIN LOW DOSE) 81 MG tablet [id number] No TAKE ONE TABLET BY MOUTH EVERY MORNING Robert Pier, MD Taking Active   atorvastatin (LIPITOR) 40 MG tablet [id number] No Take 1 tablet (40 mg total) by mouth every evening. Robert Pier, MD Taking Active   calcium-vitamin D (OSCAL WITH D) 500-200 MG-UNIT tablet [id number] No Take 1 tablet by mouth daily. [provider] Taking Active Self  lisinopril-hydrochlorothiazide (ZESTORETIC) 20-25 MG tablet [id number] No TAKE TWO TABLETS BY MOUTH ONCE DAILY FOR BLOOD PRESSURE Robert Pier, MD Taking Active   Misc. Devices MISC [id number] No Large blood pressure cuff device For home blood pressure monitoring Robert Pier, MD Taking Active   Multiple Vitamins-Minerals (MENS MULTIVITAMIN PLUS PO) [id number] No Take 1 tablet by mouth daily. [provider] Taking Active Self  vitamin B-12 (CYANOCOBALAMIN) 500 MCG tablet [id number] No TAKE ONE TABLET BY MOUTH EVERY MORNING Robert Pier, MD Taking Active             Patient Active Problem List   Diagnosis Date Noted  Bilateral leg weakness 01/02/2022   Hemiparesis affecting right side as late effect of cerebrovascular accident (CVA) ([place]) 09/06/2020   Personal history of fall 09/06/2020   Chronic right shoulder  pain 09/06/2020   Closed bimalleolar fracture of right ankle 07/27/2020   History of loop recorder 05/02/2020   Carotid artery disease ([place]) 05/02/2020   Right hemiparesis ([place]) 04/30/2020   Obesity (BMI 35.0-39.9 without comorbidity) 04/30/2020   Hyperlipidemia 04/26/2019   Hypokalemia    Acute embolic stroke ([place])    History of CVA (cerebrovascular accident) 02/11/2018   Acute kidney injury superimposed on chronic kidney disease ([place]) 02/11/2018   History of syphilis 02/11/2018   Constipation 02/11/2018   Osteoarthritis 02/11/2018   Benign essential HTN 02/11/2018   Acute encephalopathy 02/10/2018    Conditions to be addressed/monitored per PCP order:   level of care  Care Plan : [place] of Care  Updates made by Melissa Montane, RN since 02/27/2022 12:00 AM     Problem: CHL AMB "PATIENT-SPECIFIC PROBLEM"      Long-Range Goal: Development of Plan of Care to address Health Management needs related to level of care   Start Date: 12/25/2021  Expected End Date: 02/23/2022  Note:   Current Barriers:  Care Coordination needs related to Level of care concerns [name]/[name] reports change in PCP to Dr. Basilio Donovan at [facility] She is pleased with the new provider. Daily will start PT next week. She denies any needs at this time.  RNCM Clinical Goal(s):  Patient will verbalize understanding of plan for management of level of care as evidenced by patient/parent reports attend all scheduled medical appointments: 01/01/22 with Dr. Lorin Mercy as evidenced by provider documentation in EMR           Interventions: Inter-disciplinary care team collaboration (see longitudinal plan of care) Evaluation of current treatment plan related to  self management and patient's adherence to plan as established by provider   Level of Care  (Status: Goal Met.) Long Term Goal  Evaluation of current treatment plan related to  level of care , Level of care concerns  self-management and patient's adherence to plan as established by provider. Discussed plans with patient for ongoing care management follow up and provided patient with direct contact information for care management team Reviewed scheduled/upcoming provider appointments including PT starting next week, new PCP provider-[facility], Dr. Basilio Donovan; Discussed plans with patient for ongoing care management follow up and provided patient with direct contact information for care management team; PT evaluation, will start treatment next week Provided patient's mother with [employer] Member Services [phone] and reviewed member benefits  Patient Goals/Self-Care Activities: Take medications as prescribed   Attend all scheduled provider appointments Call provider office for new concerns or questions  Contact [employer] with questions regarding covered services       Follow Up:  Patient agrees to Care Plan and Follow-up.  Plan: The  Designated Party Release (DPR) has been provided with contact information for the Managed Medicaid care management team and has been advised to call with any health related questions or concerns.  Date/time of next scheduled RN care management/care coordination outreach:  no follow up   Lurena Joiner RN, [place] RN Care Coordinator

## 2022-03-03 ENCOUNTER — Ambulatory Visit (INDEPENDENT_AMBULATORY_CARE_PROVIDER_SITE_OTHER): Payer: Medicaid Other

## 2022-03-03 DIAGNOSIS — I639 Cerebral infarction, unspecified: Secondary | ICD-10-CM

## 2022-03-04 LAB — CUP PACEART REMOTE DEVICE CHECK
Date Time Interrogation Session: 20231001231327
Implantable Pulse Generator Implant Date: 20201214

## 2022-03-07 ENCOUNTER — Encounter: Payer: Self-pay | Admitting: Podiatry

## 2022-03-07 ENCOUNTER — Ambulatory Visit (INDEPENDENT_AMBULATORY_CARE_PROVIDER_SITE_OTHER): Payer: Medicaid Other | Admitting: Podiatry

## 2022-03-07 DIAGNOSIS — N189 Chronic kidney disease, unspecified: Secondary | ICD-10-CM

## 2022-03-07 DIAGNOSIS — M79674 Pain in right toe(s): Secondary | ICD-10-CM

## 2022-03-07 DIAGNOSIS — N179 Acute kidney failure, unspecified: Secondary | ICD-10-CM | POA: Diagnosis not present

## 2022-03-07 DIAGNOSIS — I739 Peripheral vascular disease, unspecified: Secondary | ICD-10-CM

## 2022-03-07 DIAGNOSIS — D689 Coagulation defect, unspecified: Secondary | ICD-10-CM | POA: Diagnosis not present

## 2022-03-07 DIAGNOSIS — M79675 Pain in left toe(s): Secondary | ICD-10-CM

## 2022-03-07 DIAGNOSIS — B351 Tinea unguium: Secondary | ICD-10-CM

## 2022-03-07 NOTE — Progress Notes (Signed)
This patient returns to the office for at risk foot care.  This patient requires this care by a professional since this patient will be at risk due to having swelling, peripheral angiopathy acute kidney disease and symtomatic toenails.    These nails are painful walking and wearing shoes.  This patient presents for at risk foot care today.  General Appearance  Alert, conversant and in no acute stress.  Vascular  Dorsalis pedis palpable and posterior tibial  pulses are non palpable  bilaterally.  Capillary return is within normal limits  bilaterally. Temperature is within normal limits  Bilaterally. Bilateral swelling is present   Neurologic  Senn-Weinstein monofilament wire test within normal limits  bilaterally. Muscle power within normal limits bilaterally.  Nails Thick disfigured discolored nails with subungual debris  from hallux to fifth toes bilaterally. No evidence of bacterial infection or drainage bilaterally.   Orthopedic  No limitations of motion  feet .  No crepitus or effusions noted.  No bony pathology or digital deformities noted.    Skin  normotropic skin with no porokeratosis noted bilaterally.  No signs of infections or ulcers noted.    I  Assessment:  Onychomycosis  Pain in right toes  Pain in left toes, peripheral angiopathy with swelling bilateral.   Plan: Consent was obtained for treatment procedures.   Mechanical debridement of nails 1-5  bilaterally performed with a nail nipper.  Filed with dremel without incident.    Return office visit   3 months.    Told patient to return for periodic foot care and evaluation due to potential at risk complications.  Shaquinta Peruski DPM    

## 2022-03-19 NOTE — Progress Notes (Signed)
Carelink Summary Report / Loop Recorder 

## 2022-04-07 ENCOUNTER — Ambulatory Visit (INDEPENDENT_AMBULATORY_CARE_PROVIDER_SITE_OTHER): Payer: Medicaid Other

## 2022-04-07 DIAGNOSIS — I639 Cerebral infarction, unspecified: Secondary | ICD-10-CM

## 2022-04-09 LAB — CUP PACEART REMOTE DEVICE CHECK
Date Time Interrogation Session: 20231103230714
Implantable Pulse Generator Implant Date: 20201214

## 2022-04-10 ENCOUNTER — Other Ambulatory Visit: Payer: Self-pay | Admitting: Internal Medicine

## 2022-04-10 DIAGNOSIS — I129 Hypertensive chronic kidney disease with stage 1 through stage 4 chronic kidney disease, or unspecified chronic kidney disease: Secondary | ICD-10-CM

## 2022-04-11 NOTE — Telephone Encounter (Signed)
Requested Prescriptions  Pending Prescriptions Disp Refills   lisinopril-hydrochlorothiazide (ZESTORETIC) 20-25 MG tablet [Pharmacy Med Name: LISINOPRIL-HCTZ 20/25MG TABLETS] 180 tablet 0    Sig: TAKE 2 TABLETS ONCE DAILY FOR BLOOD PRESSURE     Cardiovascular:  ACEI + Diuretic Combos Failed - 04/10/2022  6:00 PM      Failed - Cr in normal range and within 180 days    Creatinine, Ser  Date Value Ref Range Status  12/19/2021 1.39 (H) 0.76 - 1.27 mg/dL Final         Passed - Na in normal range and within 180 days    Sodium  Date Value Ref Range Status  12/19/2021 142 134 - 144 mmol/L Final         Passed - K in normal range and within 180 days    Potassium  Date Value Ref Range Status  12/19/2021 4.0 3.5 - 5.2 mmol/L Final         Passed - eGFR is 30 or above and within 180 days    GFR calc Af Amer  Date Value Ref Range Status  02/02/2020 74 >59 mL/min/1.73 Final    Comment:    **[facility] currently reports eGFR in compliance with the current**   recommendations of the [employer]. [facility] will   update reporting as new guidelines are published from the NKF-ASN   Task force.    GFR calc non Af Amer  Date Value Ref Range Status  02/02/2020 64 >59 mL/min/1.73 Final   eGFR  Date Value Ref Range Status  12/19/2021 57 (L) >59 mL/min/1.73 Final         Passed - Patient is not pregnant      Passed - Last BP in normal range    BP Readings from Last 1 Encounters:  01/01/22 112/66         Passed - Valid encounter within last 6 months    Recent Outpatient Visits           3 months ago Essential hypertension   [place] Ladell Pier, MD   7 months ago Essential hypertension   [place] Ladell Pier, MD   1 year ago Hemiparesis affecting right side as late effect of cerebrovascular accident (CVA) [facility])   [place], [place], PA-C   1 year ago  Hemiparesis affecting right side as late effect of cerebrovascular accident (CVA) ([place])   [place] Karle Plumber B, MD   1 year ago Hemiparesis affecting right side as late effect of cerebrovascular accident (CVA) [facility])    [facility] Ladell Pier, MD

## 2022-05-12 ENCOUNTER — Ambulatory Visit (INDEPENDENT_AMBULATORY_CARE_PROVIDER_SITE_OTHER): Payer: Medicaid Other

## 2022-05-12 DIAGNOSIS — I639 Cerebral infarction, unspecified: Secondary | ICD-10-CM

## 2022-05-13 LAB — CUP PACEART REMOTE DEVICE CHECK
Date Time Interrogation Session: 20231210231559
Implantable Pulse Generator Implant Date: 20201214

## 2022-05-15 NOTE — Progress Notes (Signed)
Carelink Summary Report / Loop Recorder 

## 2022-06-09 ENCOUNTER — Encounter: Payer: Self-pay | Admitting: Podiatry

## 2022-06-09 ENCOUNTER — Ambulatory Visit: Payer: Medicaid Other | Admitting: Podiatry

## 2022-06-09 DIAGNOSIS — I739 Peripheral vascular disease, unspecified: Secondary | ICD-10-CM

## 2022-06-09 DIAGNOSIS — N189 Chronic kidney disease, unspecified: Secondary | ICD-10-CM

## 2022-06-09 DIAGNOSIS — B351 Tinea unguium: Secondary | ICD-10-CM | POA: Diagnosis not present

## 2022-06-09 DIAGNOSIS — N179 Acute kidney failure, unspecified: Secondary | ICD-10-CM

## 2022-06-09 DIAGNOSIS — M79675 Pain in left toe(s): Secondary | ICD-10-CM

## 2022-06-09 DIAGNOSIS — D689 Coagulation defect, unspecified: Secondary | ICD-10-CM

## 2022-06-09 DIAGNOSIS — M79674 Pain in right toe(s): Secondary | ICD-10-CM | POA: Diagnosis not present

## 2022-06-09 NOTE — Progress Notes (Signed)
This patient returns to the office for at risk foot care.  This patient requires this care by a professional since this patient will be at risk due to having swelling, peripheral angiopathy acute kidney disease and symtomatic toenails.    These nails are painful walking and wearing shoes.  This patient presents for at risk foot care today.  General Appearance  Alert, conversant and in no acute stress.  Vascular  Dorsalis pedis palpable and posterior tibial  pulses are non palpable  bilaterally.  Capillary return is within normal limits  bilaterally. Temperature is within normal limits  Bilaterally. Bilateral swelling is present   Neurologic  Senn-Weinstein monofilament wire test within normal limits  bilaterally. Muscle power within normal limits bilaterally.  Nails Thick disfigured discolored nails with subungual debris  from hallux to fifth toes bilaterally. No evidence of bacterial infection or drainage bilaterally.   Orthopedic  No limitations of motion  feet .  No crepitus or effusions noted.  No bony pathology or digital deformities noted.    Skin  normotropic skin with no porokeratosis noted bilaterally.  No signs of infections or ulcers noted.    I  Assessment:  Onychomycosis  Pain in right toes  Pain in left toes, peripheral angiopathy with swelling bilateral.   Plan: Consent was obtained for treatment procedures.   Mechanical debridement of nails 1-5  bilaterally performed with a nail nipper.  Filed with dremel without incident.    Return office visit   3 months.    Told patient to return for periodic foot care and evaluation due to potential at risk complications.  Gardiner Barefoot DPM

## 2022-06-16 ENCOUNTER — Ambulatory Visit (INDEPENDENT_AMBULATORY_CARE_PROVIDER_SITE_OTHER): Payer: Medicaid Other

## 2022-06-16 DIAGNOSIS — I639 Cerebral infarction, unspecified: Secondary | ICD-10-CM | POA: Diagnosis not present

## 2022-06-17 LAB — CUP PACEART REMOTE DEVICE CHECK
Date Time Interrogation Session: 20240112231225
Implantable Pulse Generator Implant Date: 20201214

## 2022-06-20 NOTE — Progress Notes (Signed)
Carelink Summary Report / Loop Recorder

## 2022-07-20 LAB — CUP PACEART REMOTE DEVICE CHECK
Date Time Interrogation Session: 20240214231444
Implantable Pulse Generator Implant Date: 20201214

## 2022-07-21 ENCOUNTER — Ambulatory Visit (INDEPENDENT_AMBULATORY_CARE_PROVIDER_SITE_OTHER): Payer: Medicaid Other

## 2022-07-21 DIAGNOSIS — I639 Cerebral infarction, unspecified: Secondary | ICD-10-CM

## 2022-08-06 NOTE — Progress Notes (Signed)
Carelink Summary Report / Loop Recorder 

## 2022-08-25 ENCOUNTER — Ambulatory Visit (INDEPENDENT_AMBULATORY_CARE_PROVIDER_SITE_OTHER): Payer: Medicaid Other

## 2022-08-25 DIAGNOSIS — I639 Cerebral infarction, unspecified: Secondary | ICD-10-CM | POA: Diagnosis not present

## 2022-08-25 LAB — CUP PACEART REMOTE DEVICE CHECK
Date Time Interrogation Session: 20240324232109
Implantable Pulse Generator Implant Date: 20201214

## 2022-09-04 NOTE — Progress Notes (Signed)
Carelink Summary Report / Loop Recorder 

## 2022-09-15 ENCOUNTER — Encounter: Payer: Self-pay | Admitting: Podiatry

## 2022-09-15 ENCOUNTER — Ambulatory Visit (INDEPENDENT_AMBULATORY_CARE_PROVIDER_SITE_OTHER): Payer: Medicaid Other | Admitting: Podiatry

## 2022-09-15 DIAGNOSIS — B351 Tinea unguium: Secondary | ICD-10-CM

## 2022-09-15 DIAGNOSIS — M79674 Pain in right toe(s): Secondary | ICD-10-CM

## 2022-09-15 DIAGNOSIS — I739 Peripheral vascular disease, unspecified: Secondary | ICD-10-CM

## 2022-09-15 DIAGNOSIS — M79675 Pain in left toe(s): Secondary | ICD-10-CM | POA: Diagnosis not present

## 2022-09-15 NOTE — Progress Notes (Signed)
This patient returns to the office for at risk foot care.  This patient requires this care by a professional since this patient will be at risk due to having swelling, peripheral angiopathy acute kidney disease and symtomatic toenails.    These nails are painful walking and wearing shoes.  This patient presents for at risk foot care today.  General Appearance  Alert, conversant and in no acute stress.  Vascular  Dorsalis pedis palpable and posterior tibial  pulses are non palpable  bilaterally.  Capillary return is within normal limits  bilaterally. Temperature is within normal limits  Bilaterally. Bilateral swelling is present   Neurologic  Senn-Weinstein monofilament wire test within normal limits  bilaterally. Muscle power within normal limits bilaterally.  Nails Thick disfigured discolored nails with subungual debris  from hallux to fifth toes bilaterally. No evidence of bacterial infection or drainage bilaterally.   Orthopedic  No limitations of motion  feet .  No crepitus or effusions noted.  No bony pathology or digital deformities noted.    Skin  normotropic skin with no porokeratosis noted bilaterally.  No signs of infections or ulcers noted.    I  Assessment:  Onychomycosis  Pain in right toes  Pain in left toes, peripheral angiopathy with swelling bilateral.   Plan: Consent was obtained for treatment procedures.   Mechanical debridement of nails 1-5  bilaterally performed with a nail nipper.  Filed with dremel without incident.    Return office visit   3 months.    Told patient to return for periodic foot care and evaluation due to potential at risk complications.  Airel Magadan DPM    

## 2022-09-26 LAB — CUP PACEART REMOTE DEVICE CHECK
Date Time Interrogation Session: 20240426230414
Implantable Pulse Generator Implant Date: 20201214

## 2022-09-29 ENCOUNTER — Ambulatory Visit (INDEPENDENT_AMBULATORY_CARE_PROVIDER_SITE_OTHER): Payer: Medicaid Other

## 2022-09-29 DIAGNOSIS — I639 Cerebral infarction, unspecified: Secondary | ICD-10-CM | POA: Diagnosis not present

## 2022-10-07 NOTE — Progress Notes (Signed)
Carelink Summary Report / Loop Recorder 

## 2022-10-31 NOTE — Progress Notes (Signed)
Carelink Summary Report / Loop Recorder 

## 2022-11-03 ENCOUNTER — Ambulatory Visit (INDEPENDENT_AMBULATORY_CARE_PROVIDER_SITE_OTHER): Payer: Medicaid Other

## 2022-11-03 DIAGNOSIS — I639 Cerebral infarction, unspecified: Secondary | ICD-10-CM

## 2022-11-03 LAB — CUP PACEART REMOTE DEVICE CHECK
Date Time Interrogation Session: 20240602230959
Implantable Pulse Generator Implant Date: 20201214

## 2022-11-26 NOTE — Progress Notes (Signed)
Carelink Summary Report / Loop Recorder 

## 2022-12-08 ENCOUNTER — Ambulatory Visit (INDEPENDENT_AMBULATORY_CARE_PROVIDER_SITE_OTHER): Payer: Medicaid Other

## 2022-12-08 DIAGNOSIS — I639 Cerebral infarction, unspecified: Secondary | ICD-10-CM | POA: Diagnosis not present

## 2022-12-08 LAB — CUP PACEART REMOTE DEVICE CHECK
Date Time Interrogation Session: 20240705230536
Implantable Pulse Generator Implant Date: 20201214

## 2022-12-15 ENCOUNTER — Encounter: Payer: Self-pay | Admitting: Podiatry

## 2022-12-15 ENCOUNTER — Ambulatory Visit (INDEPENDENT_AMBULATORY_CARE_PROVIDER_SITE_OTHER): Payer: Medicaid Other | Admitting: Podiatry

## 2022-12-15 DIAGNOSIS — I739 Peripheral vascular disease, unspecified: Secondary | ICD-10-CM

## 2022-12-15 DIAGNOSIS — M79675 Pain in left toe(s): Secondary | ICD-10-CM | POA: Diagnosis not present

## 2022-12-15 DIAGNOSIS — M79674 Pain in right toe(s): Secondary | ICD-10-CM

## 2022-12-15 DIAGNOSIS — B351 Tinea unguium: Secondary | ICD-10-CM | POA: Diagnosis not present

## 2022-12-15 NOTE — Progress Notes (Signed)
This patient returns to the office for at risk foot care.  This patient requires this care by a professional since this patient will be at risk due to having swelling, peripheral angiopathy acute kidney disease and symtomatic toenails.    These nails are painful walking and wearing shoes.  This patient presents for at risk foot care today.  General Appearance  Alert, conversant and in no acute stress.  Vascular  Dorsalis pedis palpable and posterior tibial  pulses are non palpable  bilaterally.  Capillary return is within normal limits  bilaterally. Temperature is within normal limits  Bilaterally. Bilateral swelling is present   Neurologic  Senn-Weinstein monofilament wire test within normal limits  bilaterally. Muscle power within normal limits bilaterally.  Nails Thick disfigured discolored nails with subungual debris  from hallux to fifth toes bilaterally. No evidence of bacterial infection or drainage bilaterally.   Orthopedic  No limitations of motion  feet .  No crepitus or effusions noted.  No bony pathology or digital deformities noted.    Skin  normotropic skin with no porokeratosis noted bilaterally.  No signs of infections or ulcers noted.    I  Assessment:  Onychomycosis  Pain in right toes  Pain in left toes, peripheral angiopathy with swelling bilateral.   Plan: Consent was obtained for treatment procedures.   Mechanical debridement of nails 1-5  bilaterally performed with a nail nipper.  Filed with dremel without incident.    Return office visit   3 months.    Told patient to return for periodic foot care and evaluation due to potential at risk complications.  Gregory Mayer DPM    

## 2022-12-25 NOTE — Progress Notes (Signed)
Carelink Summary Report / Loop Recorder 

## 2023-01-12 ENCOUNTER — Ambulatory Visit (INDEPENDENT_AMBULATORY_CARE_PROVIDER_SITE_OTHER): Payer: Medicare Other

## 2023-01-12 DIAGNOSIS — I639 Cerebral infarction, unspecified: Secondary | ICD-10-CM | POA: Diagnosis not present

## 2023-01-27 NOTE — Progress Notes (Signed)
Carelink Summary Report / Loop Recorder 

## 2023-02-10 LAB — CUP PACEART REMOTE DEVICE CHECK
Date Time Interrogation Session: 20240909230728
Implantable Pulse Generator Implant Date: 20201214

## 2023-02-16 ENCOUNTER — Ambulatory Visit (INDEPENDENT_AMBULATORY_CARE_PROVIDER_SITE_OTHER): Payer: Medicare Other

## 2023-02-16 DIAGNOSIS — I639 Cerebral infarction, unspecified: Secondary | ICD-10-CM | POA: Diagnosis not present

## 2023-03-04 NOTE — Progress Notes (Signed)
Carelink Summary Report / Loop Recorder 

## 2023-03-16 ENCOUNTER — Encounter: Payer: Self-pay | Admitting: Podiatry

## 2023-03-16 ENCOUNTER — Ambulatory Visit (INDEPENDENT_AMBULATORY_CARE_PROVIDER_SITE_OTHER): Payer: Medicare Other | Admitting: Podiatry

## 2023-03-16 DIAGNOSIS — M79674 Pain in right toe(s): Secondary | ICD-10-CM | POA: Diagnosis not present

## 2023-03-16 DIAGNOSIS — I739 Peripheral vascular disease, unspecified: Secondary | ICD-10-CM

## 2023-03-16 DIAGNOSIS — B351 Tinea unguium: Secondary | ICD-10-CM | POA: Diagnosis not present

## 2023-03-16 DIAGNOSIS — M79675 Pain in left toe(s): Secondary | ICD-10-CM

## 2023-03-16 NOTE — Progress Notes (Signed)
This patient returns to the office for at risk foot care.  This patient requires this care by a professional since this patient will be at risk due to having swelling, peripheral angiopathy acute kidney disease and symtomatic toenails.    These nails are painful walking and wearing shoes.  This patient presents for at risk foot care today.  General Appearance  Alert, conversant and in no acute stress.  Vascular  Dorsalis pedis palpable and posterior tibial  pulses are non palpable  bilaterally.  Capillary return is within normal limits  bilaterally. Temperature is within normal limits  Bilaterally. Bilateral swelling is present   Neurologic  Senn-Weinstein monofilament wire test within normal limits  bilaterally. Muscle power within normal limits bilaterally.  Nails Thick disfigured discolored nails with subungual debris  from hallux to fifth toes bilaterally. No evidence of bacterial infection or drainage bilaterally.   Orthopedic  No limitations of motion  feet .  No crepitus or effusions noted.  No bony pathology or digital deformities noted.    Skin  normotropic skin with no porokeratosis noted bilaterally.  No signs of infections or ulcers noted.    I  Assessment:  Onychomycosis  Pain in right toes  Pain in left toes, peripheral angiopathy with swelling bilateral.   Plan: Consent was obtained for treatment procedures.   Mechanical debridement of nails 1-5  bilaterally performed with a nail nipper.  Filed with dremel without incident.    Return office visit   3 months.    Told patient to return for periodic foot care and evaluation due to potential at risk complications.  Helane Gunther DPM

## 2023-03-23 ENCOUNTER — Ambulatory Visit (INDEPENDENT_AMBULATORY_CARE_PROVIDER_SITE_OTHER): Payer: Medicare Other

## 2023-03-23 DIAGNOSIS — I639 Cerebral infarction, unspecified: Secondary | ICD-10-CM | POA: Diagnosis not present

## 2023-03-23 LAB — CUP PACEART REMOTE DEVICE CHECK
Date Time Interrogation Session: 20241020230159
Implantable Pulse Generator Implant Date: 20201214

## 2023-04-09 NOTE — Progress Notes (Signed)
Carelink Summary Report / Loop Recorder 

## 2023-04-27 ENCOUNTER — Ambulatory Visit (INDEPENDENT_AMBULATORY_CARE_PROVIDER_SITE_OTHER): Payer: Medicare Other

## 2023-04-27 DIAGNOSIS — I639 Cerebral infarction, unspecified: Secondary | ICD-10-CM | POA: Diagnosis not present

## 2023-04-27 LAB — CUP PACEART REMOTE DEVICE CHECK
Date Time Interrogation Session: 20241122230847
Implantable Pulse Generator Implant Date: 20201214

## 2023-05-28 NOTE — Progress Notes (Signed)
Carelink Summary Report / Loop Recorder 

## 2023-06-01 ENCOUNTER — Ambulatory Visit (INDEPENDENT_AMBULATORY_CARE_PROVIDER_SITE_OTHER): Payer: Medicare Other

## 2023-06-01 DIAGNOSIS — I639 Cerebral infarction, unspecified: Secondary | ICD-10-CM

## 2023-06-01 LAB — CUP PACEART REMOTE DEVICE CHECK
Date Time Interrogation Session: 20241229230409
Implantable Pulse Generator Implant Date: 20201214

## 2023-06-18 ENCOUNTER — Encounter: Payer: Self-pay | Admitting: Podiatry

## 2023-06-18 ENCOUNTER — Ambulatory Visit: Payer: 59 | Admitting: Podiatry

## 2023-06-18 DIAGNOSIS — M79674 Pain in right toe(s): Secondary | ICD-10-CM

## 2023-06-18 DIAGNOSIS — I739 Peripheral vascular disease, unspecified: Secondary | ICD-10-CM | POA: Diagnosis not present

## 2023-06-18 DIAGNOSIS — M79675 Pain in left toe(s): Secondary | ICD-10-CM

## 2023-06-18 DIAGNOSIS — B351 Tinea unguium: Secondary | ICD-10-CM

## 2023-06-18 NOTE — Progress Notes (Signed)
This patient returns to the office for at risk foot care.  This patient requires this care by a professional since this patient will be at risk due to having swelling, peripheral angiopathy acute kidney disease and symtomatic toenails.    These nails are painful walking and wearing shoes.   Patient presents to the office in a wheelchair.   This patient presents for at risk foot care today.  General Appearance  Alert, conversant and in no acute stress.  Vascular  Dorsalis pedis palpable and posterior tibial  pulses are non palpable  bilaterally.  Capillary return is within normal limits  bilaterally. Temperature is within normal limits  Bilaterally. Bilateral swelling is present   Neurologic  Senn-Weinstein monofilament wire test within normal limits  bilaterally. Muscle power within normal limits bilaterally.  Nails Thick disfigured discolored nails with subungual debris  from hallux to fifth toes bilaterally. No evidence of bacterial infection or drainage bilaterally.   Orthopedic  No limitations of motion  feet .  No crepitus or effusions noted.  No bony pathology or digital deformities noted.    Skin  normotropic skin with no porokeratosis noted bilaterally.  No signs of infections or ulcers noted.    I  Assessment:  Onychomycosis  Pain in right toes  Pain in left toes, peripheral angiopathy with swelling bilateral.   Plan: Consent was obtained for treatment procedures.   Mechanical debridement of nails 1-5  bilaterally performed with a nail nipper.  Filed with dremel without incident.    Return office visit   3 months.    Told patient to return for periodic foot care and evaluation due to potential at risk complications.  Helane Gunther DPM

## 2023-07-06 ENCOUNTER — Ambulatory Visit (INDEPENDENT_AMBULATORY_CARE_PROVIDER_SITE_OTHER): Payer: Self-pay

## 2023-07-06 DIAGNOSIS — I639 Cerebral infarction, unspecified: Secondary | ICD-10-CM

## 2023-07-06 LAB — CUP PACEART REMOTE DEVICE CHECK
Date Time Interrogation Session: 20250202230125
Implantable Pulse Generator Implant Date: 20201214

## 2023-07-11 ENCOUNTER — Encounter: Payer: Self-pay | Admitting: Cardiovascular Disease

## 2023-08-10 ENCOUNTER — Ambulatory Visit (INDEPENDENT_AMBULATORY_CARE_PROVIDER_SITE_OTHER): Payer: Self-pay

## 2023-08-10 DIAGNOSIS — I639 Cerebral infarction, unspecified: Secondary | ICD-10-CM | POA: Diagnosis not present

## 2023-08-10 LAB — CUP PACEART REMOTE DEVICE CHECK
Date Time Interrogation Session: 20250309230149
Implantable Pulse Generator Implant Date: 20201214

## 2023-08-13 NOTE — Progress Notes (Signed)
 Carelink Summary Report / Loop Recorder

## 2023-09-14 ENCOUNTER — Ambulatory Visit (INDEPENDENT_AMBULATORY_CARE_PROVIDER_SITE_OTHER): Payer: Self-pay

## 2023-09-14 DIAGNOSIS — I639 Cerebral infarction, unspecified: Secondary | ICD-10-CM

## 2023-09-14 LAB — CUP PACEART REMOTE DEVICE CHECK
Date Time Interrogation Session: 20250413230227
Implantable Pulse Generator Implant Date: 20201214

## 2023-09-17 ENCOUNTER — Ambulatory Visit: Payer: 59 | Admitting: Podiatry

## 2023-09-19 ENCOUNTER — Encounter: Payer: Self-pay | Admitting: Cardiovascular Disease

## 2023-09-28 NOTE — Progress Notes (Signed)
 Carelink Summary Report / Loop Recorder

## 2023-09-29 ENCOUNTER — Ambulatory Visit: Admitting: Podiatry

## 2023-10-06 ENCOUNTER — Ambulatory Visit: Admitting: Podiatry

## 2023-10-08 ENCOUNTER — Ambulatory Visit (INDEPENDENT_AMBULATORY_CARE_PROVIDER_SITE_OTHER): Admitting: Podiatry

## 2023-10-08 ENCOUNTER — Encounter: Payer: Self-pay | Admitting: Podiatry

## 2023-10-08 DIAGNOSIS — I739 Peripheral vascular disease, unspecified: Secondary | ICD-10-CM | POA: Diagnosis not present

## 2023-10-08 DIAGNOSIS — M79675 Pain in left toe(s): Secondary | ICD-10-CM

## 2023-10-08 DIAGNOSIS — M79674 Pain in right toe(s): Secondary | ICD-10-CM | POA: Diagnosis not present

## 2023-10-08 DIAGNOSIS — B351 Tinea unguium: Secondary | ICD-10-CM

## 2023-10-08 NOTE — Progress Notes (Signed)
 This patient returns to the office for at risk foot care.  This patient requires this care by a professional since this patient will be at risk due to having swelling, peripheral angiopathy acute kidney disease and symtomatic toenails.    These nails are painful walking and wearing shoes.   Patient presents to the office in a wheelchair.   This patient presents for at risk foot care today.  General Appearance  Alert, conversant and in no acute stress.  Vascular  Dorsalis pedis palpable and posterior tibial  pulses are non palpable  bilaterally.  Capillary return is within normal limits  bilaterally. Temperature is within normal limits  Bilaterally. Bilateral swelling is present   Neurologic  Senn-Weinstein monofilament wire test within normal limits  bilaterally. Muscle power within normal limits bilaterally.  Nails Thick disfigured discolored nails with subungual debris  from hallux to fifth toes bilaterally. No evidence of bacterial infection or drainage bilaterally.   Orthopedic  No limitations of motion  feet .  No crepitus or effusions noted.  No bony pathology or digital deformities noted.    Skin  normotropic skin with no porokeratosis noted bilaterally.  No signs of infections or ulcers noted.    I  Assessment:  Onychomycosis  Pain in right toes  Pain in left toes, peripheral angiopathy with swelling bilateral.   Plan: Consent was obtained for treatment procedures.   Mechanical debridement of nails 1-5  bilaterally performed with a nail nipper.  Filed with dremel without incident.    Return office visit   3 months.    Told patient to return for periodic foot care and evaluation due to potential at risk complications.  Helane Gunther DPM

## 2023-10-19 ENCOUNTER — Ambulatory Visit (INDEPENDENT_AMBULATORY_CARE_PROVIDER_SITE_OTHER): Payer: Self-pay

## 2023-10-19 DIAGNOSIS — I639 Cerebral infarction, unspecified: Secondary | ICD-10-CM

## 2023-10-20 LAB — CUP PACEART REMOTE DEVICE CHECK
Date Time Interrogation Session: 20250518230711
Implantable Pulse Generator Implant Date: 20201214

## 2023-10-23 ENCOUNTER — Ambulatory Visit: Payer: Self-pay | Admitting: Cardiovascular Disease

## 2023-11-05 NOTE — Progress Notes (Signed)
 Carelink Summary Report / Loop Recorder

## 2023-11-19 ENCOUNTER — Ambulatory Visit (INDEPENDENT_AMBULATORY_CARE_PROVIDER_SITE_OTHER): Payer: Self-pay

## 2023-11-19 DIAGNOSIS — I639 Cerebral infarction, unspecified: Secondary | ICD-10-CM | POA: Diagnosis not present

## 2023-11-19 LAB — CUP PACEART REMOTE DEVICE CHECK
Date Time Interrogation Session: 20250618230707
Implantable Pulse Generator Implant Date: 20201214

## 2023-11-26 ENCOUNTER — Ambulatory Visit: Payer: Self-pay | Admitting: Cardiovascular Disease

## 2023-12-11 NOTE — Addendum Note (Signed)
 Addended by: TAWNI DRILLING D on: 12/11/2023 10:10 AM   Modules accepted: Orders

## 2023-12-11 NOTE — Progress Notes (Signed)
 Carelink Summary Report / Loop Recorder

## 2023-12-21 ENCOUNTER — Ambulatory Visit (INDEPENDENT_AMBULATORY_CARE_PROVIDER_SITE_OTHER): Payer: Self-pay

## 2023-12-21 DIAGNOSIS — I639 Cerebral infarction, unspecified: Secondary | ICD-10-CM

## 2023-12-22 ENCOUNTER — Ambulatory Visit: Payer: Self-pay | Admitting: Cardiovascular Disease

## 2023-12-22 LAB — CUP PACEART REMOTE DEVICE CHECK
Date Time Interrogation Session: 20250720230458
Implantable Pulse Generator Implant Date: 20201214

## 2024-01-08 ENCOUNTER — Ambulatory Visit: Admitting: Podiatry

## 2024-01-08 ENCOUNTER — Encounter: Payer: Self-pay | Admitting: Podiatry

## 2024-01-08 DIAGNOSIS — B351 Tinea unguium: Secondary | ICD-10-CM | POA: Diagnosis not present

## 2024-01-08 DIAGNOSIS — I739 Peripheral vascular disease, unspecified: Secondary | ICD-10-CM

## 2024-01-08 DIAGNOSIS — M79674 Pain in right toe(s): Secondary | ICD-10-CM

## 2024-01-08 DIAGNOSIS — D689 Coagulation defect, unspecified: Secondary | ICD-10-CM

## 2024-01-08 DIAGNOSIS — M79675 Pain in left toe(s): Secondary | ICD-10-CM

## 2024-01-08 NOTE — Progress Notes (Signed)
 This patient returns to the office for at risk foot care.  This patient requires this care by a professional since this patient will be at risk due to having swelling, peripheral angiopathy acute kidney disease and symtomatic toenails.    These nails are painful walking and wearing shoes.   Patient presents to the office in a wheelchair.   This patient presents for at risk foot care today.  General Appearance  Alert, conversant and in no acute stress.  Vascular  Dorsalis pedis palpable and posterior tibial  pulses are non palpable  bilaterally.  Capillary return is within normal limits  bilaterally. Temperature is within normal limits  Bilaterally. Bilateral swelling is present   Neurologic  Senn-Weinstein monofilament wire test within normal limits  bilaterally. Muscle power within normal limits bilaterally.  Nails Thick disfigured discolored nails with subungual debris  from hallux to fifth toes bilaterally. No evidence of bacterial infection or drainage bilaterally.   Orthopedic  No limitations of motion  feet .  No crepitus or effusions noted.  No bony pathology or digital deformities noted.    Skin  normotropic skin with no porokeratosis noted bilaterally.  No signs of infections or ulcers noted.    I  Assessment:  Onychomycosis  Pain in right toes  Pain in left toes, peripheral angiopathy with swelling bilateral.   Plan: Consent was obtained for treatment procedures.   Mechanical debridement of nails 1-5  bilaterally performed with a nail nipper.  Filed with dremel without incident.    Return office visit   3 months.    Told patient to return for periodic foot care and evaluation due to potential at risk complications.  Helane Gunther DPM

## 2024-01-19 NOTE — Progress Notes (Signed)
 Carelink Summary Report / Loop Recorder

## 2024-01-21 ENCOUNTER — Ambulatory Visit: Payer: Self-pay | Admitting: Cardiovascular Disease

## 2024-01-21 ENCOUNTER — Ambulatory Visit (INDEPENDENT_AMBULATORY_CARE_PROVIDER_SITE_OTHER): Payer: Self-pay

## 2024-01-21 DIAGNOSIS — I639 Cerebral infarction, unspecified: Secondary | ICD-10-CM

## 2024-01-21 LAB — CUP PACEART REMOTE DEVICE CHECK
Date Time Interrogation Session: 20250820230105
Implantable Pulse Generator Implant Date: 20201214

## 2024-02-22 ENCOUNTER — Ambulatory Visit: Payer: Self-pay | Admitting: Cardiovascular Disease

## 2024-02-22 ENCOUNTER — Ambulatory Visit (INDEPENDENT_AMBULATORY_CARE_PROVIDER_SITE_OTHER): Payer: Self-pay

## 2024-02-22 DIAGNOSIS — I639 Cerebral infarction, unspecified: Secondary | ICD-10-CM

## 2024-02-22 LAB — CUP PACEART REMOTE DEVICE CHECK
Date Time Interrogation Session: 20250921230327
Implantable Pulse Generator Implant Date: 20201214

## 2024-02-23 NOTE — Progress Notes (Signed)
 Remote Loop Recorder Transmission

## 2024-03-07 NOTE — Progress Notes (Signed)
 Remote Loop Recorder Transmission

## 2024-03-25 ENCOUNTER — Ambulatory Visit: Attending: Cardiovascular Disease

## 2024-03-25 DIAGNOSIS — I639 Cerebral infarction, unspecified: Secondary | ICD-10-CM | POA: Diagnosis not present

## 2024-03-26 LAB — CUP PACEART REMOTE DEVICE CHECK
Date Time Interrogation Session: 20251023231741
Implantable Pulse Generator Implant Date: 20201214

## 2024-03-28 ENCOUNTER — Ambulatory Visit: Payer: Self-pay | Admitting: Cardiovascular Disease

## 2024-03-29 NOTE — Progress Notes (Signed)
 Remote Loop Recorder Transmission

## 2024-04-15 ENCOUNTER — Ambulatory Visit: Admitting: Podiatry

## 2024-04-25 ENCOUNTER — Ambulatory Visit

## 2024-04-25 DIAGNOSIS — I639 Cerebral infarction, unspecified: Secondary | ICD-10-CM | POA: Diagnosis not present

## 2024-04-26 ENCOUNTER — Ambulatory Visit: Payer: Self-pay | Admitting: Cardiovascular Disease

## 2024-04-26 LAB — CUP PACEART REMOTE DEVICE CHECK
Date Time Interrogation Session: 20251123230941
Implantable Pulse Generator Implant Date: 20201214

## 2024-04-26 NOTE — Progress Notes (Signed)
 Remote Loop Recorder Transmission

## 2024-05-11 ENCOUNTER — Ambulatory Visit (INDEPENDENT_AMBULATORY_CARE_PROVIDER_SITE_OTHER): Admitting: Podiatry

## 2024-05-11 ENCOUNTER — Encounter: Payer: Self-pay | Admitting: Podiatry

## 2024-05-11 DIAGNOSIS — M79675 Pain in left toe(s): Secondary | ICD-10-CM

## 2024-05-11 DIAGNOSIS — D689 Coagulation defect, unspecified: Secondary | ICD-10-CM

## 2024-05-11 DIAGNOSIS — M79674 Pain in right toe(s): Secondary | ICD-10-CM | POA: Diagnosis not present

## 2024-05-11 DIAGNOSIS — B351 Tinea unguium: Secondary | ICD-10-CM

## 2024-05-11 DIAGNOSIS — I739 Peripheral vascular disease, unspecified: Secondary | ICD-10-CM

## 2024-05-11 NOTE — Progress Notes (Signed)
 This patient returns to the office for at risk foot care.  This patient requires this care by a professional since this patient will be at risk due to having swelling, peripheral angiopathy acute kidney disease and symtomatic toenails.    These nails are painful walking and wearing shoes.   Patient presents to the office in a wheelchair.   This patient presents for at risk foot care today.  General Appearance  Alert, conversant and in no acute stress.  Vascular  Dorsalis pedis palpable and posterior tibial  pulses are non palpable  bilaterally.  Capillary return is within normal limits  bilaterally. Temperature is within normal limits  Bilaterally. Bilateral swelling is present   Neurologic  Senn-Weinstein monofilament wire test within normal limits  bilaterally. Muscle power within normal limits bilaterally.  Nails Thick disfigured discolored nails with subungual debris  from hallux to fifth toes bilaterally. No evidence of bacterial infection or drainage bilaterally.   Orthopedic  No limitations of motion  feet .  No crepitus or effusions noted.  No bony pathology or digital deformities noted.    Skin  normotropic skin with no porokeratosis noted bilaterally.  No signs of infections or ulcers noted.    I  Assessment:  Onychomycosis  Pain in right toes  Pain in left toes, peripheral angiopathy with swelling bilateral.   Plan: Consent was obtained for treatment procedures.   Mechanical debridement of nails 1-5  bilaterally performed with a nail nipper.  Filed with dremel without incident.    Return office visit   3 months.    Told patient to return for periodic foot care and evaluation due to potential at risk complications.  Helane Gunther DPM

## 2024-05-17 NOTE — Progress Notes (Signed)
 Visit  was canceled. Not Seen.

## 2024-05-26 ENCOUNTER — Ambulatory Visit

## 2024-05-26 DIAGNOSIS — I639 Cerebral infarction, unspecified: Secondary | ICD-10-CM

## 2024-05-27 LAB — CUP PACEART REMOTE DEVICE CHECK
Date Time Interrogation Session: 20251224230704
Implantable Pulse Generator Implant Date: 20201214

## 2024-05-27 NOTE — Progress Notes (Signed)
 Remote Loop Recorder Transmission

## 2024-05-28 ENCOUNTER — Ambulatory Visit: Payer: Self-pay | Admitting: Cardiovascular Disease

## 2024-06-15 ENCOUNTER — Telehealth: Payer: Self-pay

## 2024-06-15 NOTE — Telephone Encounter (Signed)
 ILR reached RRT: 06/14/2024  Marked I in Paceart: Done Enter note in Paceart: Done Canceled future remotes: Done Discontinued from website: Done Entered in Speciality Comments: Done  Attempted to contact patient. No answer, left message to call back.

## 2024-06-16 NOTE — Telephone Encounter (Signed)
 We will allow his ILR to RIP Thank you

## 2024-06-16 NOTE — Telephone Encounter (Signed)
 Returned call to patient mother as requested. Spoke w/ patient and patient mother who request to device in place. Informed to contact device clinic for any further questions or concerns. Verbalized understanding. Appreciative for call.

## 2024-06-16 NOTE — Telephone Encounter (Signed)
 Note states in patient chart Call patient mother with anything that needs to be done for patient.   Spoke w/ patient mother regarding ILR reaching RRT. 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 Discussed different options moving forward. Informed patient mother at this time patient can leave ILR device in place w/ no harm in doing so or if they have a preference to extract ILR, we can schedule an OV visit.   Patient mother request to call her back this morning after she speaks w/ patient and she will let us  know their preference moving forward.   Will continue to monitor and update accordingly.

## 2024-06-26 ENCOUNTER — Ambulatory Visit

## 2024-07-27 ENCOUNTER — Ambulatory Visit

## 2024-08-10 ENCOUNTER — Ambulatory Visit: Admitting: Podiatry

## 2024-08-27 ENCOUNTER — Ambulatory Visit
